# Patient Record
Sex: Female | Born: 1965
Health system: Southern US, Community
[De-identification: ages and names within clinical notes are randomized; demographics above are authoritative.]

## PROBLEM LIST (undated history)

## (undated) DIAGNOSIS — M1990 Unspecified osteoarthritis, unspecified site: Secondary | ICD-10-CM

## (undated) DIAGNOSIS — R7303 Prediabetes: Secondary | ICD-10-CM

## (undated) DIAGNOSIS — J302 Other seasonal allergic rhinitis: Secondary | ICD-10-CM

## (undated) DIAGNOSIS — M545 Low back pain, unspecified: Secondary | ICD-10-CM

## (undated) DIAGNOSIS — J4540 Moderate persistent asthma, uncomplicated: Secondary | ICD-10-CM

## (undated) DIAGNOSIS — G8929 Other chronic pain: Secondary | ICD-10-CM

## (undated) DIAGNOSIS — IMO0002 Reserved for concepts with insufficient information to code with codable children: Secondary | ICD-10-CM

## (undated) DIAGNOSIS — F329 Major depressive disorder, single episode, unspecified: Secondary | ICD-10-CM

## (undated) DIAGNOSIS — I10 Essential (primary) hypertension: Secondary | ICD-10-CM

## (undated) DIAGNOSIS — Z8711 Personal history of peptic ulcer disease: Secondary | ICD-10-CM

## (undated) DIAGNOSIS — M5430 Sciatica, unspecified side: Secondary | ICD-10-CM

## (undated) DIAGNOSIS — Z8719 Personal history of other diseases of the digestive system: Secondary | ICD-10-CM

## (undated) DIAGNOSIS — S83511A Sprain of anterior cruciate ligament of right knee, initial encounter: Secondary | ICD-10-CM

## (undated) DIAGNOSIS — J45909 Unspecified asthma, uncomplicated: Secondary | ICD-10-CM

## (undated) DIAGNOSIS — Z973 Presence of spectacles and contact lenses: Secondary | ICD-10-CM

## (undated) DIAGNOSIS — G4730 Sleep apnea, unspecified: Secondary | ICD-10-CM

## (undated) DIAGNOSIS — R0602 Shortness of breath: Secondary | ICD-10-CM

## (undated) DIAGNOSIS — S83206A Unspecified tear of unspecified meniscus, current injury, right knee, initial encounter: Secondary | ICD-10-CM

## (undated) DIAGNOSIS — E785 Hyperlipidemia, unspecified: Secondary | ICD-10-CM

## (undated) DIAGNOSIS — F32A Depression, unspecified: Secondary | ICD-10-CM

## (undated) DIAGNOSIS — F1020 Alcohol dependence, uncomplicated: Secondary | ICD-10-CM

## (undated) DIAGNOSIS — F319 Bipolar disorder, unspecified: Secondary | ICD-10-CM

## (undated) DIAGNOSIS — G4733 Obstructive sleep apnea (adult) (pediatric): Secondary | ICD-10-CM

## (undated) DIAGNOSIS — U099 Post covid-19 condition, unspecified: Secondary | ICD-10-CM

## (undated) DIAGNOSIS — R053 Chronic cough: Secondary | ICD-10-CM

## (undated) DIAGNOSIS — K5909 Other constipation: Secondary | ICD-10-CM

## (undated) DIAGNOSIS — K219 Gastro-esophageal reflux disease without esophagitis: Secondary | ICD-10-CM

## (undated) HISTORY — DX: Sleep apnea, unspecified: G47.30

## (undated) HISTORY — DX: Unspecified osteoarthritis, unspecified site: M19.90

## (undated) HISTORY — PX: ABDOMINAL HYSTERECTOMY: SHX81

---
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
## 1998-06-30 ENCOUNTER — Emergency Department (HOSPITAL_COMMUNITY): Admission: EM | Admit: 1998-06-30 | Discharge: 1998-06-30 | Payer: Self-pay | Admitting: Emergency Medicine

## 1998-11-14 ENCOUNTER — Emergency Department (HOSPITAL_COMMUNITY): Admission: EM | Admit: 1998-11-14 | Discharge: 1998-11-14 | Payer: Self-pay | Admitting: Emergency Medicine

## 1999-04-28 ENCOUNTER — Encounter: Payer: Self-pay | Admitting: Emergency Medicine

## 1999-04-28 ENCOUNTER — Emergency Department (HOSPITAL_COMMUNITY): Admission: EM | Admit: 1999-04-28 | Discharge: 1999-04-28 | Payer: Self-pay | Admitting: Emergency Medicine

## 2000-02-18 ENCOUNTER — Emergency Department (HOSPITAL_COMMUNITY): Admission: EM | Admit: 2000-02-18 | Discharge: 2000-02-18 | Payer: Self-pay | Admitting: Emergency Medicine

## 2000-05-14 ENCOUNTER — Emergency Department (HOSPITAL_COMMUNITY): Admission: EM | Admit: 2000-05-14 | Discharge: 2000-05-15 | Payer: Self-pay | Admitting: Emergency Medicine

## 2000-05-16 ENCOUNTER — Emergency Department (HOSPITAL_COMMUNITY): Admission: EM | Admit: 2000-05-16 | Discharge: 2000-05-16 | Payer: Self-pay | Admitting: Emergency Medicine

## 2000-05-16 ENCOUNTER — Encounter: Payer: Self-pay | Admitting: *Deleted

## 2003-03-02 ENCOUNTER — Emergency Department (HOSPITAL_COMMUNITY): Admission: EM | Admit: 2003-03-02 | Discharge: 2003-03-02 | Payer: Self-pay | Admitting: Emergency Medicine

## 2003-03-02 ENCOUNTER — Emergency Department (HOSPITAL_COMMUNITY): Admission: EM | Admit: 2003-03-02 | Discharge: 2003-03-02 | Payer: Self-pay | Admitting: *Deleted

## 2003-03-02 ENCOUNTER — Encounter: Payer: Self-pay | Admitting: Emergency Medicine

## 2003-11-10 ENCOUNTER — Emergency Department (HOSPITAL_COMMUNITY): Admission: EM | Admit: 2003-11-10 | Discharge: 2003-11-10 | Payer: Self-pay | Admitting: Emergency Medicine

## 2003-12-15 ENCOUNTER — Emergency Department (HOSPITAL_COMMUNITY): Admission: EM | Admit: 2003-12-15 | Discharge: 2003-12-15 | Payer: Self-pay | Admitting: Emergency Medicine

## 2004-02-06 ENCOUNTER — Emergency Department (HOSPITAL_COMMUNITY): Admission: EM | Admit: 2004-02-06 | Discharge: 2004-02-07 | Payer: Self-pay | Admitting: Emergency Medicine

## 2004-02-18 ENCOUNTER — Ambulatory Visit (HOSPITAL_COMMUNITY): Admission: RE | Admit: 2004-02-18 | Discharge: 2004-02-18 | Payer: Self-pay | Admitting: Emergency Medicine

## 2004-11-13 ENCOUNTER — Emergency Department (HOSPITAL_COMMUNITY): Admission: EM | Admit: 2004-11-13 | Discharge: 2004-11-13 | Payer: Self-pay | Admitting: Emergency Medicine

## 2004-12-10 ENCOUNTER — Emergency Department (HOSPITAL_COMMUNITY): Admission: EM | Admit: 2004-12-10 | Discharge: 2004-12-11 | Payer: Self-pay | Admitting: Emergency Medicine

## 2005-02-12 ENCOUNTER — Emergency Department (HOSPITAL_COMMUNITY): Admission: EM | Admit: 2005-02-12 | Discharge: 2005-02-12 | Payer: Self-pay | Admitting: Emergency Medicine

## 2005-03-02 ENCOUNTER — Emergency Department (HOSPITAL_COMMUNITY): Admission: EM | Admit: 2005-03-02 | Discharge: 2005-03-02 | Payer: Self-pay | Admitting: Emergency Medicine

## 2005-06-04 ENCOUNTER — Emergency Department (HOSPITAL_COMMUNITY): Admission: EM | Admit: 2005-06-04 | Discharge: 2005-06-04 | Payer: Self-pay | Admitting: Emergency Medicine

## 2005-08-09 ENCOUNTER — Emergency Department (HOSPITAL_COMMUNITY): Admission: EM | Admit: 2005-08-09 | Discharge: 2005-08-09 | Payer: Self-pay | Admitting: Emergency Medicine

## 2005-10-19 ENCOUNTER — Emergency Department (HOSPITAL_COMMUNITY): Admission: EM | Admit: 2005-10-19 | Discharge: 2005-10-19 | Payer: Self-pay | Admitting: Emergency Medicine

## 2005-11-28 ENCOUNTER — Emergency Department (HOSPITAL_COMMUNITY): Admission: EM | Admit: 2005-11-28 | Discharge: 2005-11-28 | Payer: Self-pay | Admitting: Emergency Medicine

## 2006-04-03 ENCOUNTER — Emergency Department (HOSPITAL_COMMUNITY): Admission: EM | Admit: 2006-04-03 | Discharge: 2006-04-03 | Payer: Self-pay | Admitting: Emergency Medicine

## 2006-04-14 ENCOUNTER — Emergency Department (HOSPITAL_COMMUNITY): Admission: EM | Admit: 2006-04-14 | Discharge: 2006-04-14 | Payer: Self-pay | Admitting: Emergency Medicine

## 2006-04-17 ENCOUNTER — Inpatient Hospital Stay (HOSPITAL_COMMUNITY): Admission: AD | Admit: 2006-04-17 | Discharge: 2006-04-17 | Payer: Self-pay | Admitting: Gynecology

## 2006-04-19 ENCOUNTER — Ambulatory Visit: Payer: Self-pay | Admitting: Obstetrics & Gynecology

## 2006-04-19 ENCOUNTER — Inpatient Hospital Stay (HOSPITAL_COMMUNITY): Admission: AD | Admit: 2006-04-19 | Discharge: 2006-04-22 | Payer: Self-pay | Admitting: Gynecology

## 2006-04-20 ENCOUNTER — Encounter (INDEPENDENT_AMBULATORY_CARE_PROVIDER_SITE_OTHER): Payer: Self-pay | Admitting: Specialist

## 2006-04-20 HISTORY — PX: TOTAL ABDOMINAL HYSTERECTOMY W/ BILATERAL SALPINGOOPHORECTOMY: SHX83

## 2006-04-26 ENCOUNTER — Ambulatory Visit: Payer: Self-pay | Admitting: Gynecology

## 2006-06-01 ENCOUNTER — Ambulatory Visit: Payer: Self-pay | Admitting: Gynecology

## 2006-06-27 ENCOUNTER — Emergency Department (HOSPITAL_COMMUNITY): Admission: EM | Admit: 2006-06-27 | Discharge: 2006-06-27 | Payer: Self-pay | Admitting: Emergency Medicine

## 2006-06-30 ENCOUNTER — Emergency Department (HOSPITAL_COMMUNITY): Admission: EM | Admit: 2006-06-30 | Discharge: 2006-06-30 | Payer: Self-pay | Admitting: Emergency Medicine

## 2006-08-18 ENCOUNTER — Ambulatory Visit: Payer: Self-pay | Admitting: Family Medicine

## 2006-08-21 ENCOUNTER — Ambulatory Visit: Payer: Self-pay | Admitting: *Deleted

## 2006-08-22 ENCOUNTER — Ambulatory Visit (HOSPITAL_COMMUNITY): Admission: RE | Admit: 2006-08-22 | Discharge: 2006-08-22 | Payer: Self-pay | Admitting: Internal Medicine

## 2006-10-17 ENCOUNTER — Emergency Department (HOSPITAL_COMMUNITY): Admission: EM | Admit: 2006-10-17 | Discharge: 2006-10-17 | Payer: Self-pay | Admitting: Emergency Medicine

## 2006-10-24 ENCOUNTER — Emergency Department (HOSPITAL_COMMUNITY): Admission: EM | Admit: 2006-10-24 | Discharge: 2006-10-24 | Payer: Self-pay | Admitting: Emergency Medicine

## 2006-11-21 ENCOUNTER — Ambulatory Visit: Payer: Self-pay | Admitting: Family Medicine

## 2006-11-28 ENCOUNTER — Emergency Department (HOSPITAL_COMMUNITY): Admission: EM | Admit: 2006-11-28 | Discharge: 2006-11-28 | Payer: Self-pay | Admitting: Emergency Medicine

## 2007-01-30 ENCOUNTER — Emergency Department (HOSPITAL_COMMUNITY): Admission: EM | Admit: 2007-01-30 | Discharge: 2007-01-30 | Payer: Self-pay | Admitting: Emergency Medicine

## 2007-02-10 ENCOUNTER — Emergency Department (HOSPITAL_COMMUNITY): Admission: EM | Admit: 2007-02-10 | Discharge: 2007-02-10 | Payer: Self-pay | Admitting: Emergency Medicine

## 2007-03-27 ENCOUNTER — Emergency Department (HOSPITAL_COMMUNITY): Admission: EM | Admit: 2007-03-27 | Discharge: 2007-03-27 | Payer: Self-pay | Admitting: Emergency Medicine

## 2007-03-29 ENCOUNTER — Emergency Department (HOSPITAL_COMMUNITY): Admission: EM | Admit: 2007-03-29 | Discharge: 2007-03-29 | Payer: Self-pay | Admitting: Emergency Medicine

## 2007-04-07 ENCOUNTER — Emergency Department (HOSPITAL_COMMUNITY): Admission: EM | Admit: 2007-04-07 | Discharge: 2007-04-07 | Payer: Self-pay | Admitting: Emergency Medicine

## 2007-04-18 ENCOUNTER — Emergency Department (HOSPITAL_COMMUNITY): Admission: EM | Admit: 2007-04-18 | Discharge: 2007-04-18 | Payer: Self-pay | Admitting: *Deleted

## 2007-04-23 ENCOUNTER — Ambulatory Visit: Payer: Self-pay | Admitting: Internal Medicine

## 2007-04-23 LAB — CONVERTED CEMR LAB
ALT: 13 units/L (ref 0–35)
AST: 18 units/L (ref 0–37)
Albumin: 4.1 g/dL (ref 3.5–5.2)
Alkaline Phosphatase: 55 units/L (ref 39–117)
BUN: 12 mg/dL (ref 6–23)
Basophils Absolute: 0 10*3/uL (ref 0.0–0.1)
Basophils Relative: 0 % (ref 0–1)
CO2: 25 meq/L (ref 19–32)
Calcium: 9.3 mg/dL (ref 8.4–10.5)
Chloride: 106 meq/L (ref 96–112)
Cholesterol: 200 mg/dL (ref 0–200)
Creatinine, Ser: 0.81 mg/dL (ref 0.40–1.20)
Eosinophils Absolute: 0.4 10*3/uL (ref 0.0–0.7)
Eosinophils Relative: 5 % (ref 0–5)
Glucose, Bld: 111 mg/dL — ABNORMAL HIGH (ref 70–99)
HCT: 36.8 % (ref 36.0–46.0)
HDL: 44 mg/dL (ref >39)
Helicobacter Pylori Antibody-IgG: 1.4 — ABNORMAL HIGH
Hemoglobin: 12.7 g/dL (ref 12.0–15.0)
LDL Cholesterol: 117 mg/dL — ABNORMAL HIGH (ref 0–99)
Lymphocytes Relative: 71 % — ABNORMAL HIGH (ref 12–46)
Lymphs Abs: 4.9 10*3/uL — ABNORMAL HIGH (ref 0.7–3.3)
MCHC: 34.5 g/dL (ref 30.0–36.0)
MCV: 89.5 fL (ref 78.0–100.0)
Monocytes Absolute: 0.6 10*3/uL (ref 0.2–0.7)
Monocytes Relative: 8 % (ref 3–11)
Neutro Abs: 1.1 10*3/uL — ABNORMAL LOW (ref 1.7–7.7)
Neutrophils Relative %: 16 % — ABNORMAL LOW (ref 43–77)
Platelets: 340 10*3/uL (ref 150–400)
Potassium: 4 meq/L (ref 3.5–5.3)
RBC: 4.11 M/uL (ref 3.87–5.11)
RDW: 13.7 % (ref 11.5–14.0)
Sodium: 141 meq/L (ref 135–145)
TSH: 1.119 microintl units/mL (ref 0.350–5.50)
Total Bilirubin: 0.3 mg/dL (ref 0.3–1.2)
Total CHOL/HDL Ratio: 4.5
Total Protein: 8 g/dL (ref 6.0–8.3)
Triglycerides: 195 mg/dL — ABNORMAL HIGH (ref <150)
VLDL: 39 mg/dL (ref 0–40)
WBC: 6.9 10*3/uL (ref 4.0–10.5)

## 2007-04-25 ENCOUNTER — Encounter (INDEPENDENT_AMBULATORY_CARE_PROVIDER_SITE_OTHER): Payer: Self-pay | Admitting: *Deleted

## 2007-04-26 ENCOUNTER — Ambulatory Visit: Payer: Self-pay | Admitting: Internal Medicine

## 2007-05-07 ENCOUNTER — Ambulatory Visit (HOSPITAL_COMMUNITY): Admission: RE | Admit: 2007-05-07 | Discharge: 2007-05-07 | Payer: Self-pay | Admitting: Nurse Practitioner

## 2007-05-07 ENCOUNTER — Emergency Department (HOSPITAL_COMMUNITY): Admission: EM | Admit: 2007-05-07 | Discharge: 2007-05-07 | Payer: Self-pay | Admitting: Emergency Medicine

## 2007-05-14 ENCOUNTER — Ambulatory Visit: Payer: Self-pay | Admitting: Internal Medicine

## 2007-05-24 ENCOUNTER — Emergency Department (HOSPITAL_COMMUNITY): Admission: EM | Admit: 2007-05-24 | Discharge: 2007-05-24 | Payer: Self-pay | Admitting: Emergency Medicine

## 2007-05-28 ENCOUNTER — Emergency Department (HOSPITAL_COMMUNITY): Admission: EM | Admit: 2007-05-28 | Discharge: 2007-05-28 | Payer: Self-pay | Admitting: Emergency Medicine

## 2007-07-23 ENCOUNTER — Emergency Department (HOSPITAL_COMMUNITY): Admission: EM | Admit: 2007-07-23 | Discharge: 2007-07-23 | Payer: Self-pay | Admitting: Emergency Medicine

## 2007-07-26 ENCOUNTER — Ambulatory Visit: Payer: Self-pay | Admitting: Internal Medicine

## 2007-08-14 ENCOUNTER — Ambulatory Visit: Payer: Self-pay | Admitting: Family Medicine

## 2007-09-11 ENCOUNTER — Ambulatory Visit: Payer: Self-pay | Admitting: Internal Medicine

## 2007-09-17 ENCOUNTER — Emergency Department (HOSPITAL_COMMUNITY): Admission: EM | Admit: 2007-09-17 | Discharge: 2007-09-17 | Payer: Self-pay | Admitting: Emergency Medicine

## 2007-10-18 ENCOUNTER — Ambulatory Visit: Payer: Self-pay | Admitting: Internal Medicine

## 2007-10-29 ENCOUNTER — Emergency Department (HOSPITAL_COMMUNITY): Admission: EM | Admit: 2007-10-29 | Discharge: 2007-10-29 | Payer: Self-pay | Admitting: Emergency Medicine

## 2007-11-13 ENCOUNTER — Ambulatory Visit: Payer: Self-pay | Admitting: Internal Medicine

## 2008-01-12 ENCOUNTER — Emergency Department (HOSPITAL_COMMUNITY): Admission: EM | Admit: 2008-01-12 | Discharge: 2008-01-12 | Payer: Self-pay | Admitting: Emergency Medicine

## 2008-01-16 ENCOUNTER — Ambulatory Visit: Payer: Self-pay | Admitting: Internal Medicine

## 2008-01-21 ENCOUNTER — Emergency Department (HOSPITAL_COMMUNITY): Admission: EM | Admit: 2008-01-21 | Discharge: 2008-01-21 | Payer: Self-pay | Admitting: Family Medicine

## 2008-01-22 ENCOUNTER — Ambulatory Visit: Payer: Self-pay | Admitting: Internal Medicine

## 2008-01-30 ENCOUNTER — Ambulatory Visit (HOSPITAL_COMMUNITY): Admission: RE | Admit: 2008-01-30 | Discharge: 2008-01-30 | Payer: Self-pay | Admitting: Internal Medicine

## 2008-02-28 ENCOUNTER — Ambulatory Visit: Payer: Self-pay | Admitting: Internal Medicine

## 2008-04-24 ENCOUNTER — Encounter: Admission: RE | Admit: 2008-04-24 | Discharge: 2008-05-14 | Payer: Self-pay | Admitting: Neurosurgery

## 2008-04-30 ENCOUNTER — Ambulatory Visit: Payer: Self-pay | Admitting: Internal Medicine

## 2008-05-17 ENCOUNTER — Emergency Department (HOSPITAL_COMMUNITY): Admission: EM | Admit: 2008-05-17 | Discharge: 2008-05-17 | Payer: Self-pay | Admitting: Emergency Medicine

## 2008-05-20 ENCOUNTER — Ambulatory Visit: Payer: Self-pay | Admitting: Internal Medicine

## 2008-05-29 ENCOUNTER — Ambulatory Visit: Payer: Self-pay | Admitting: Internal Medicine

## 2008-06-03 ENCOUNTER — Ambulatory Visit: Payer: Self-pay | Admitting: Internal Medicine

## 2008-06-04 ENCOUNTER — Emergency Department (HOSPITAL_COMMUNITY): Admission: EM | Admit: 2008-06-04 | Discharge: 2008-06-04 | Payer: Self-pay | Admitting: Emergency Medicine

## 2008-06-26 ENCOUNTER — Ambulatory Visit: Payer: Self-pay | Admitting: Family Medicine

## 2008-07-10 ENCOUNTER — Ambulatory Visit: Payer: Self-pay | Admitting: Internal Medicine

## 2008-07-16 ENCOUNTER — Encounter: Admission: RE | Admit: 2008-07-16 | Discharge: 2008-07-16 | Payer: Self-pay | Admitting: Internal Medicine

## 2008-07-31 ENCOUNTER — Encounter: Admission: RE | Admit: 2008-07-31 | Discharge: 2008-07-31 | Payer: Self-pay | Admitting: Internal Medicine

## 2008-08-18 ENCOUNTER — Ambulatory Visit: Payer: Self-pay | Admitting: Internal Medicine

## 2008-08-22 ENCOUNTER — Emergency Department (HOSPITAL_COMMUNITY): Admission: EM | Admit: 2008-08-22 | Discharge: 2008-08-22 | Payer: Self-pay | Admitting: Emergency Medicine

## 2008-08-22 ENCOUNTER — Encounter: Admission: RE | Admit: 2008-08-22 | Discharge: 2008-08-22 | Payer: Self-pay | Admitting: Internal Medicine

## 2008-08-26 ENCOUNTER — Ambulatory Visit: Payer: Self-pay | Admitting: Internal Medicine

## 2008-09-28 IMAGING — CR DG LUMBAR SPINE COMPLETE 4+V
5 series · 5 of 5 positions shown · non-contrast
Comparison: Abdominal radiograph 05/07/2007.

CLINICAL DATA: 42-year-old female with low back pain radiating down
left leg.  No known injury.

LUMBAR SPINE - COMPLETE 4+ VIEW

[view not recorded (1 of 5)]
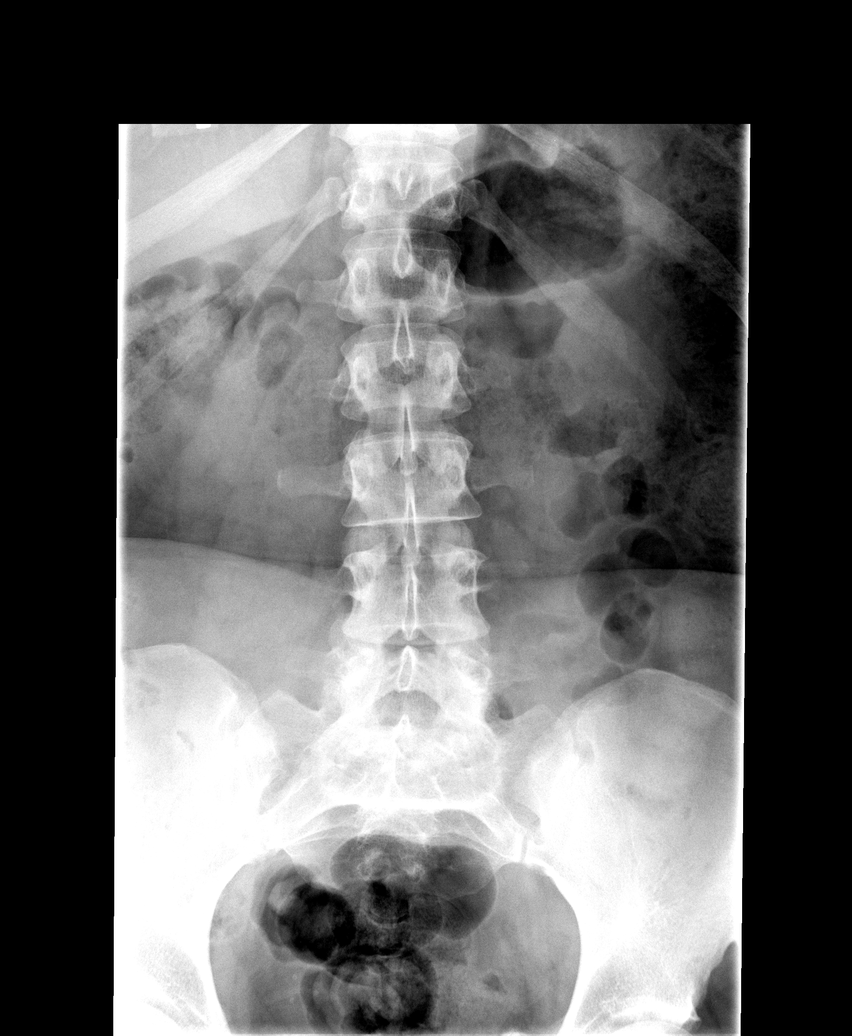

[view not recorded (2 of 5)]
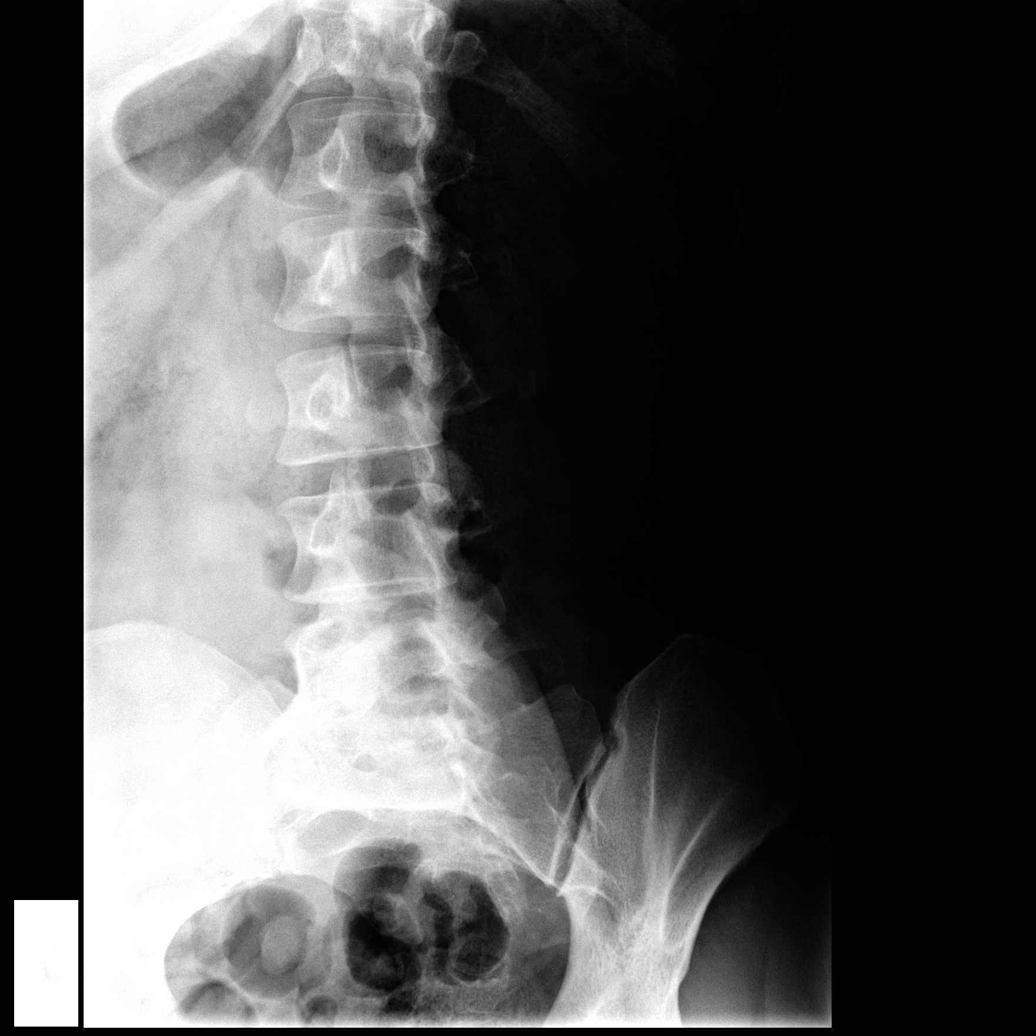

[view not recorded (3 of 5)]
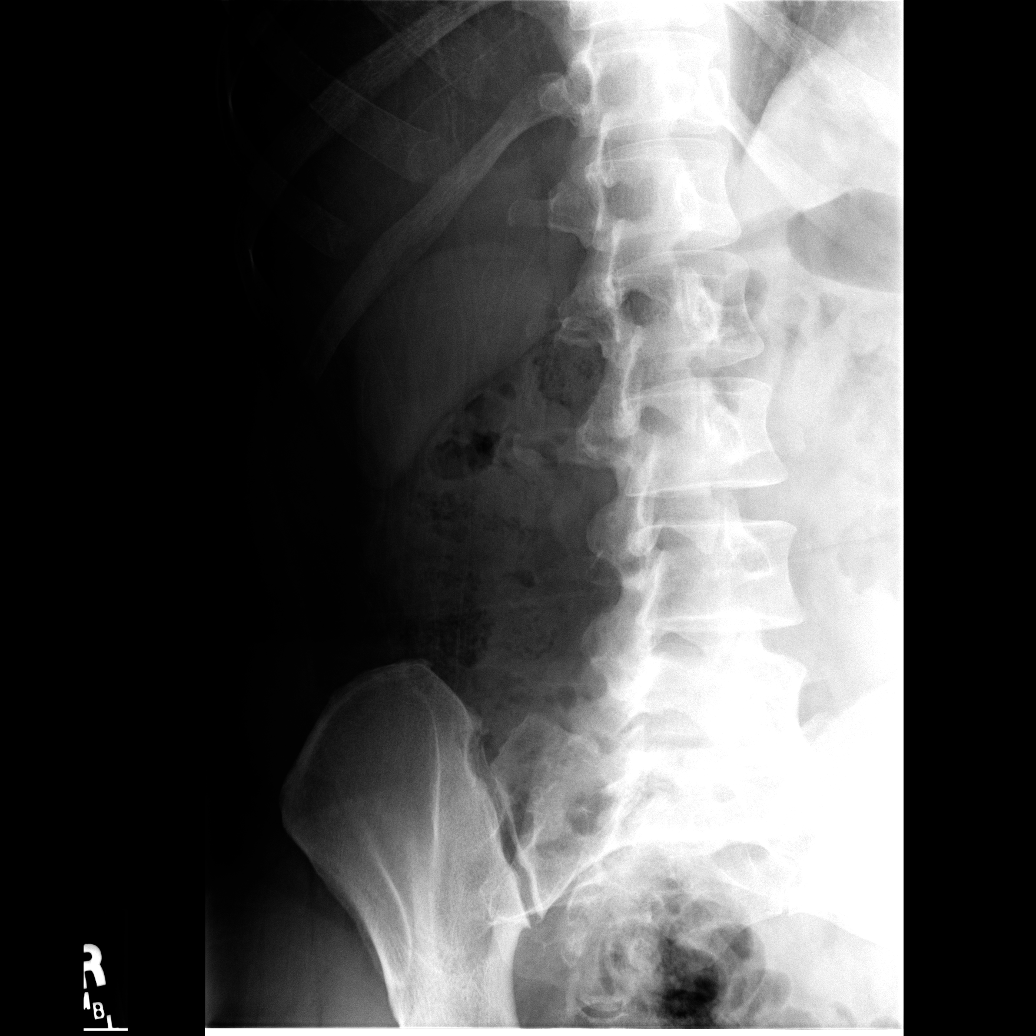

[view not recorded (4 of 5)]
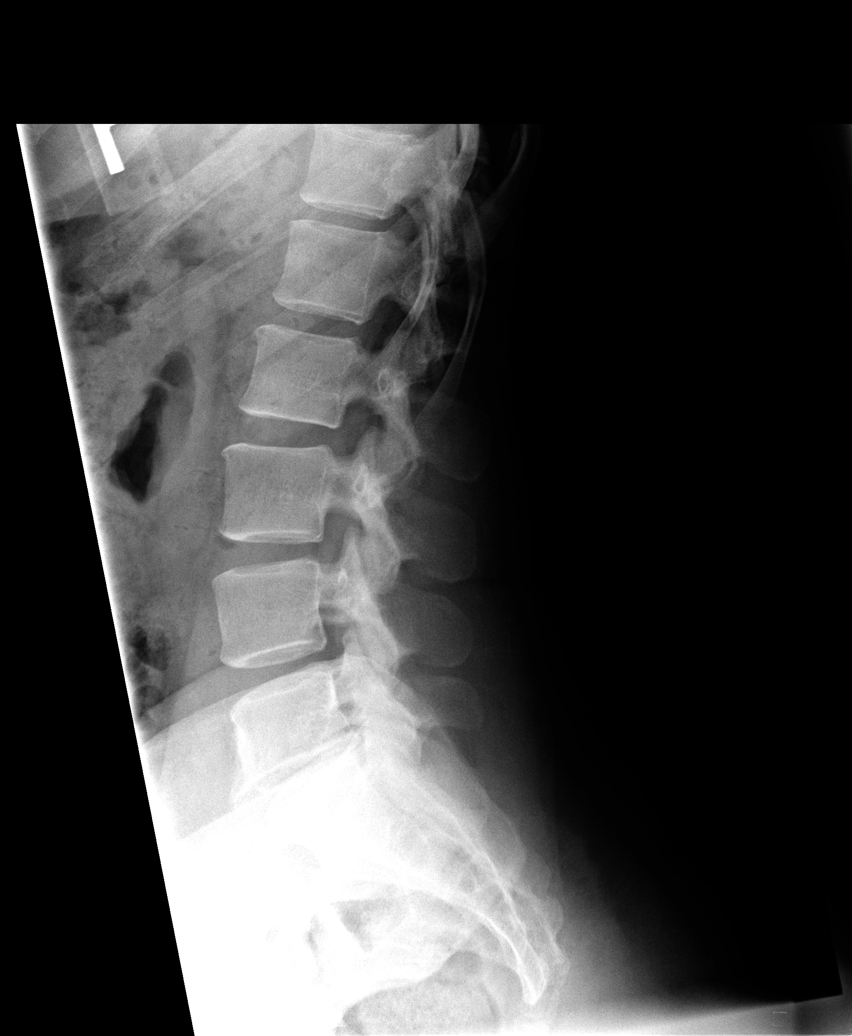

[view not recorded (5 of 5)]
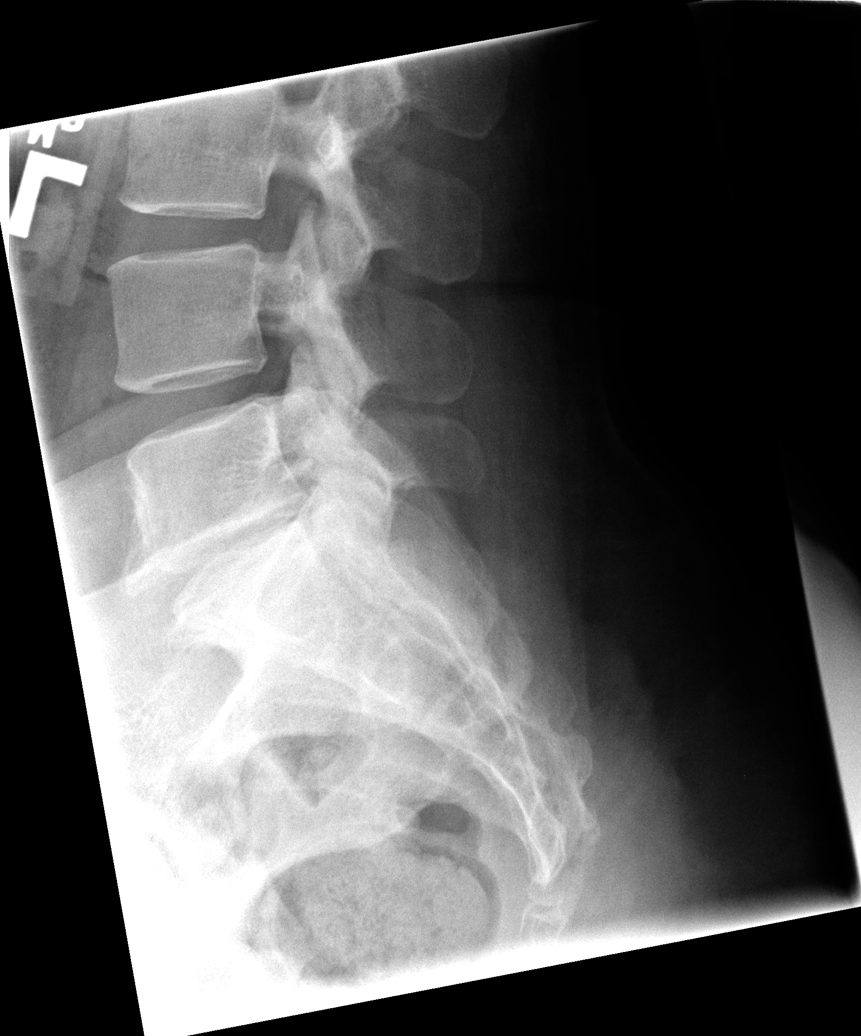

[5 of 5 positions shown; findings below may reference images not displayed]

FINDINGS: Normal lumbar segmentation.  L5-S1 disc space narrowing
with endplate osteophytes.  No spondylolisthesis.  No pars
fracture.  Visualized sacrum and pelvis appear intact.  Visualized
lower thoracic levels appear intact.
IMPRESSION: 1.  Chronic-appearing L5-S1 disc degeneration.
2. No acute fracture or listhesis identified in the lumbar spine.

## 2008-10-02 ENCOUNTER — Emergency Department (HOSPITAL_COMMUNITY): Admission: EM | Admit: 2008-10-02 | Discharge: 2008-10-02 | Payer: Self-pay | Admitting: Emergency Medicine

## 2008-10-21 ENCOUNTER — Ambulatory Visit: Payer: Self-pay | Admitting: Family Medicine

## 2008-10-21 ENCOUNTER — Encounter (INDEPENDENT_AMBULATORY_CARE_PROVIDER_SITE_OTHER): Payer: Self-pay | Admitting: Internal Medicine

## 2008-10-21 LAB — CONVERTED CEMR LAB
BUN: 18 mg/dL (ref 6–23)
CO2: 24 meq/L (ref 19–32)
Calcium: 9.9 mg/dL (ref 8.4–10.5)
Chloride: 103 meq/L (ref 96–112)
Creatinine, Ser: 0.98 mg/dL (ref 0.40–1.20)
Glucose, Bld: 95 mg/dL (ref 70–99)
Potassium: 3.9 meq/L (ref 3.5–5.3)
Sodium: 139 meq/L (ref 135–145)

## 2008-11-17 ENCOUNTER — Encounter: Payer: Self-pay | Admitting: Internal Medicine

## 2008-11-17 ENCOUNTER — Ambulatory Visit: Payer: Self-pay | Admitting: Internal Medicine

## 2008-11-17 DIAGNOSIS — K219 Gastro-esophageal reflux disease without esophagitis: Secondary | ICD-10-CM | POA: Insufficient documentation

## 2008-11-17 DIAGNOSIS — F329 Major depressive disorder, single episode, unspecified: Secondary | ICD-10-CM | POA: Insufficient documentation

## 2008-11-17 DIAGNOSIS — R51 Headache: Secondary | ICD-10-CM | POA: Insufficient documentation

## 2008-11-17 DIAGNOSIS — R519 Headache, unspecified: Secondary | ICD-10-CM | POA: Insufficient documentation

## 2008-11-17 DIAGNOSIS — J019 Acute sinusitis, unspecified: Secondary | ICD-10-CM | POA: Insufficient documentation

## 2008-11-17 DIAGNOSIS — J309 Allergic rhinitis, unspecified: Secondary | ICD-10-CM | POA: Insufficient documentation

## 2008-11-18 ENCOUNTER — Emergency Department (HOSPITAL_COMMUNITY): Admission: EM | Admit: 2008-11-18 | Discharge: 2008-11-18 | Payer: Self-pay | Admitting: Emergency Medicine

## 2008-12-06 ENCOUNTER — Emergency Department (HOSPITAL_COMMUNITY): Admission: EM | Admit: 2008-12-06 | Discharge: 2008-12-06 | Payer: Self-pay | Admitting: Emergency Medicine

## 2008-12-17 ENCOUNTER — Emergency Department (HOSPITAL_COMMUNITY): Admission: EM | Admit: 2008-12-17 | Discharge: 2008-12-17 | Payer: Self-pay | Admitting: Emergency Medicine

## 2008-12-25 ENCOUNTER — Ambulatory Visit: Payer: Self-pay | Admitting: Internal Medicine

## 2009-01-29 ENCOUNTER — Ambulatory Visit: Payer: Self-pay | Admitting: Internal Medicine

## 2009-02-12 ENCOUNTER — Ambulatory Visit: Payer: Self-pay | Admitting: Internal Medicine

## 2009-03-17 ENCOUNTER — Emergency Department (HOSPITAL_COMMUNITY): Admission: EM | Admit: 2009-03-17 | Discharge: 2009-03-17 | Payer: Self-pay | Admitting: Emergency Medicine

## 2009-03-17 ENCOUNTER — Telehealth (INDEPENDENT_AMBULATORY_CARE_PROVIDER_SITE_OTHER): Payer: Self-pay | Admitting: *Deleted

## 2009-05-14 ENCOUNTER — Ambulatory Visit: Payer: Self-pay | Admitting: Internal Medicine

## 2009-05-15 ENCOUNTER — Encounter (INDEPENDENT_AMBULATORY_CARE_PROVIDER_SITE_OTHER): Payer: Self-pay | Admitting: Internal Medicine

## 2009-05-15 LAB — CONVERTED CEMR LAB
BUN: 10 mg/dL (ref 6–23)
CO2: 25 meq/L (ref 19–32)
Calcium: 9.6 mg/dL (ref 8.4–10.5)
Chloride: 105 meq/L (ref 96–112)
Creatinine, Ser: 0.89 mg/dL (ref 0.40–1.20)
Glucose, Bld: 85 mg/dL (ref 70–99)
Potassium: 4.1 meq/L (ref 3.5–5.3)
Sodium: 140 meq/L (ref 135–145)

## 2009-05-18 ENCOUNTER — Emergency Department (HOSPITAL_COMMUNITY): Admission: EM | Admit: 2009-05-18 | Discharge: 2009-05-18 | Payer: Self-pay | Admitting: Emergency Medicine

## 2009-06-17 ENCOUNTER — Ambulatory Visit: Payer: Self-pay | Admitting: Obstetrics and Gynecology

## 2009-06-30 ENCOUNTER — Emergency Department (HOSPITAL_COMMUNITY): Admission: EM | Admit: 2009-06-30 | Discharge: 2009-06-30 | Payer: Self-pay | Admitting: Emergency Medicine

## 2009-08-05 ENCOUNTER — Ambulatory Visit: Payer: Self-pay | Admitting: Internal Medicine

## 2009-08-08 HISTORY — PX: BUNIONECTOMY: SHX129

## 2009-08-25 ENCOUNTER — Emergency Department (HOSPITAL_COMMUNITY): Admission: EM | Admit: 2009-08-25 | Discharge: 2009-08-25 | Payer: Self-pay | Admitting: Emergency Medicine

## 2009-08-25 IMAGING — CR DG CHEST 2V
2 series · 2 of 2 positions shown · non-contrast
Comparison: 12/06/2008

CLINICAL DATA: Cough

CHEST - 2 VIEW

[w chest lat]
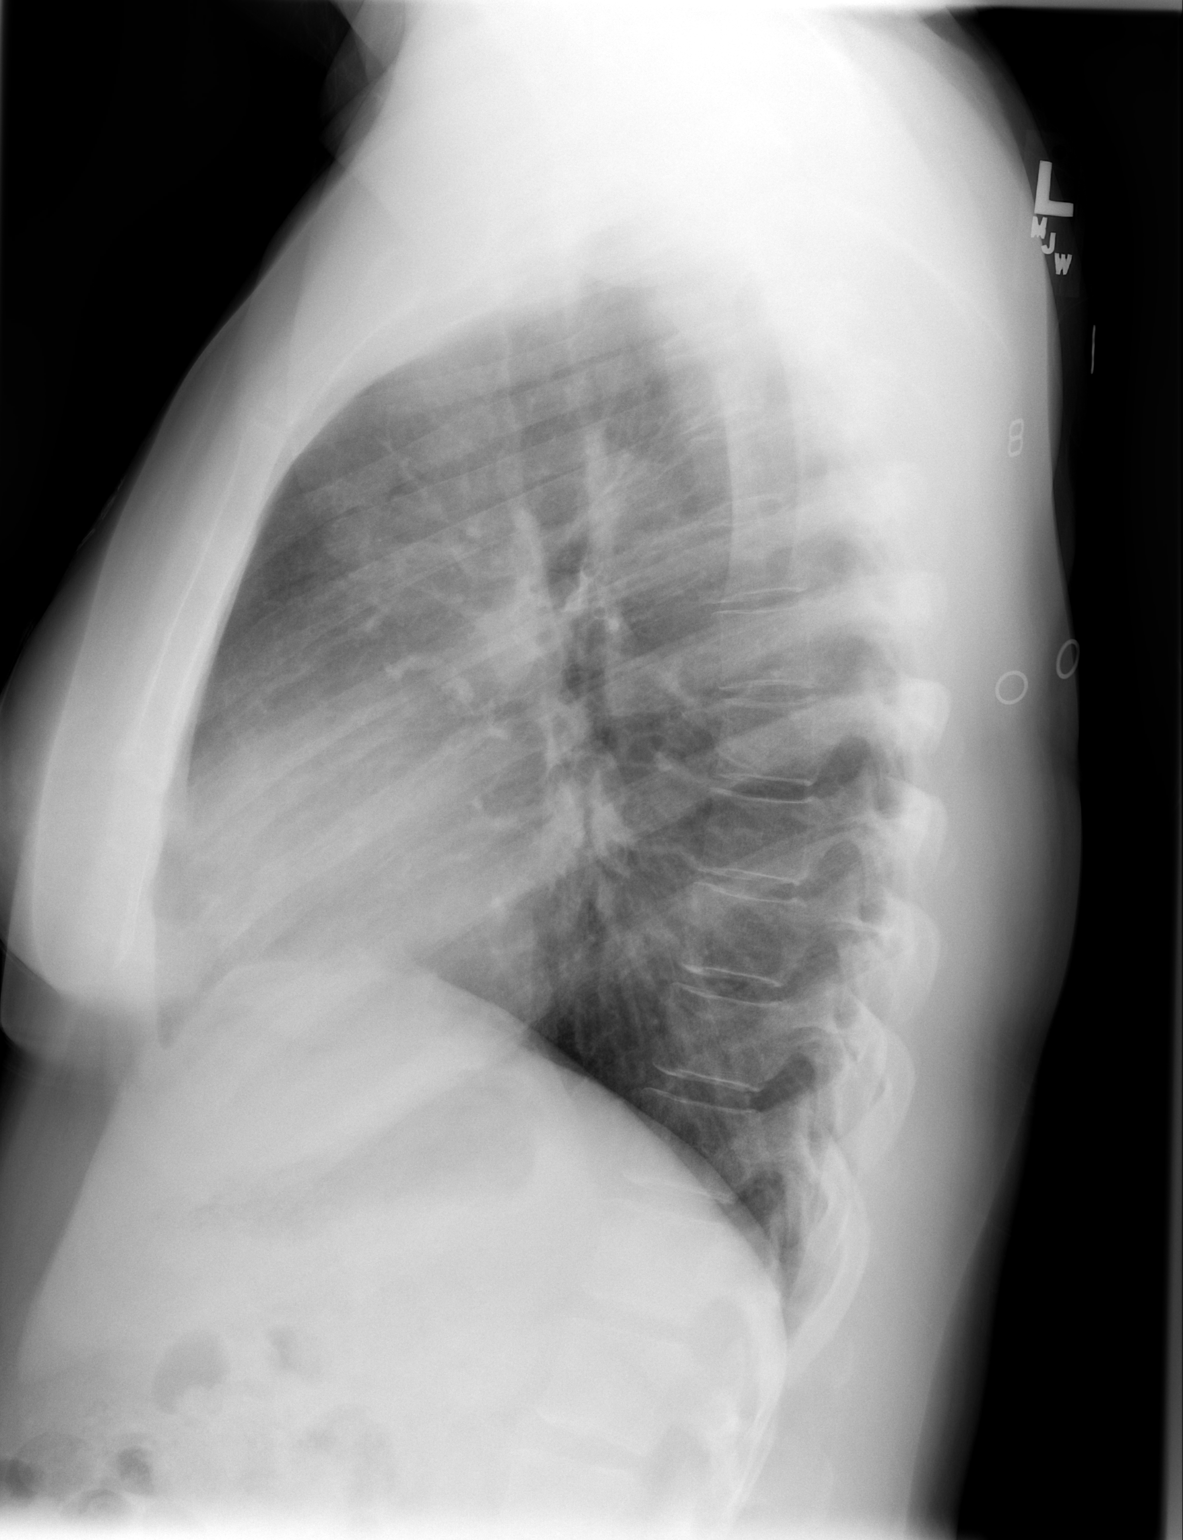

[w chest pa]
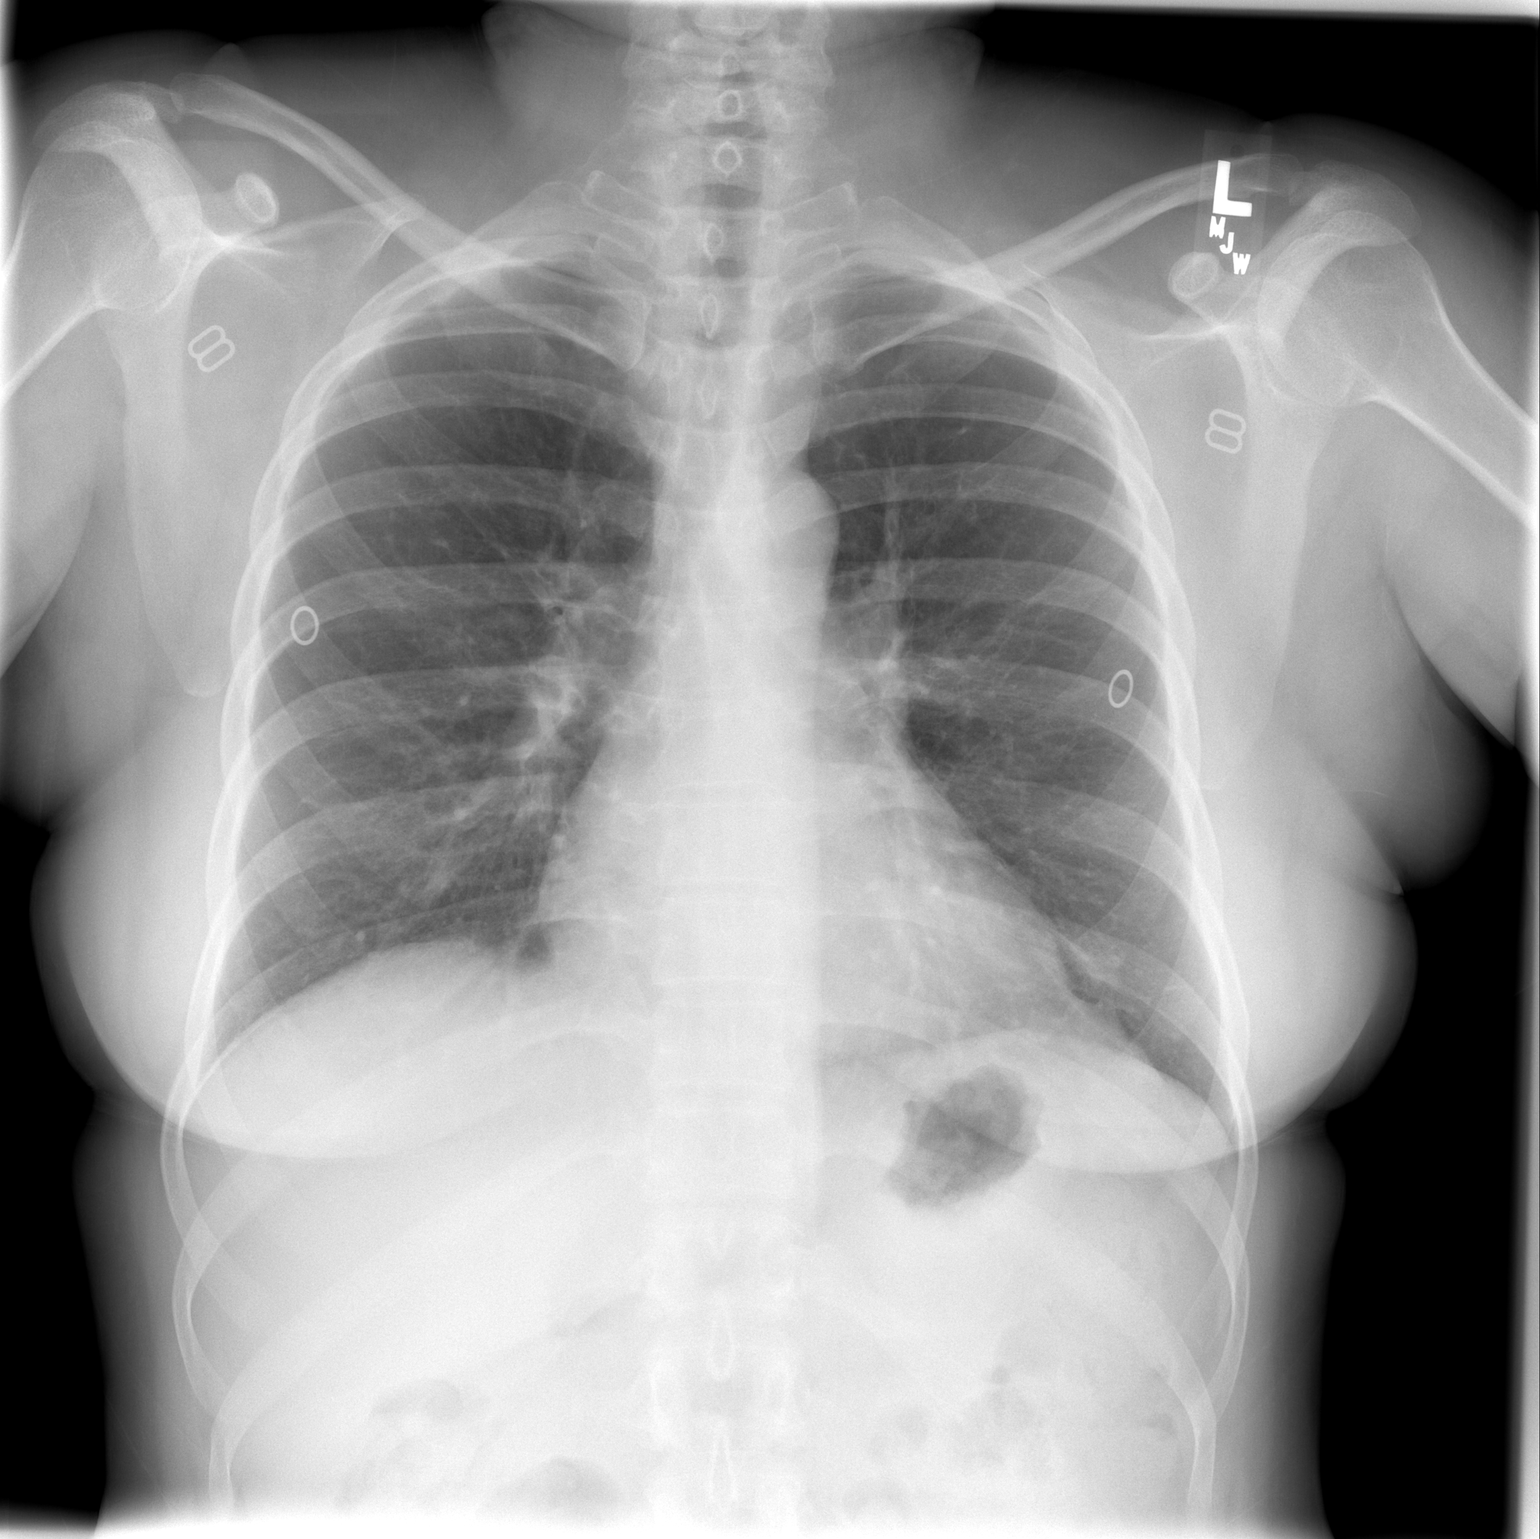

[2 of 2 positions shown; findings below may reference images not displayed]

FINDINGS: Heart size remains normal.  Peribronchial thickening
without active airspace disease.  There is minimal scarring at the
left base.  No pleural fluid or osseous lesions.
IMPRESSION: Chronic changes as above - no acute process or interval change.

## 2009-08-26 ENCOUNTER — Ambulatory Visit: Payer: Self-pay | Admitting: Internal Medicine

## 2009-10-01 ENCOUNTER — Emergency Department (HOSPITAL_COMMUNITY): Admission: EM | Admit: 2009-10-01 | Discharge: 2009-10-01 | Payer: Self-pay | Admitting: Emergency Medicine

## 2009-10-03 ENCOUNTER — Emergency Department (HOSPITAL_COMMUNITY): Admission: EM | Admit: 2009-10-03 | Discharge: 2009-10-03 | Payer: Self-pay | Admitting: Emergency Medicine

## 2009-10-27 ENCOUNTER — Emergency Department (HOSPITAL_COMMUNITY): Admission: EM | Admit: 2009-10-27 | Discharge: 2009-10-27 | Payer: Self-pay | Admitting: Emergency Medicine

## 2009-12-03 ENCOUNTER — Emergency Department (HOSPITAL_COMMUNITY): Admission: EM | Admit: 2009-12-03 | Discharge: 2009-12-03 | Payer: Self-pay | Admitting: Emergency Medicine

## 2010-01-21 ENCOUNTER — Emergency Department (HOSPITAL_COMMUNITY): Admission: EM | Admit: 2010-01-21 | Discharge: 2010-01-21 | Payer: Self-pay | Admitting: Emergency Medicine

## 2010-02-16 ENCOUNTER — Emergency Department (HOSPITAL_COMMUNITY): Admission: EM | Admit: 2010-02-16 | Discharge: 2010-02-16 | Payer: Self-pay | Admitting: Emergency Medicine

## 2010-03-22 ENCOUNTER — Encounter: Admission: RE | Admit: 2010-03-22 | Discharge: 2010-03-22 | Payer: Self-pay | Admitting: Specialist

## 2010-06-21 ENCOUNTER — Emergency Department (HOSPITAL_COMMUNITY): Admission: EM | Admit: 2010-06-21 | Discharge: 2010-06-21 | Payer: Self-pay | Admitting: Emergency Medicine

## 2010-08-06 ENCOUNTER — Encounter
Admission: RE | Admit: 2010-08-06 | Discharge: 2010-08-06 | Payer: Self-pay | Source: Home / Self Care | Attending: Specialist | Admitting: Specialist

## 2010-08-08 HISTORY — PX: HEEL SPUR SURGERY: SHX665

## 2010-09-07 NOTE — Miscellaneous (Signed)
Summary: Office Visit ([facility])    Vital Signs:  Patient profile:   45 year old female Weight:      191.0 pounds Temp:     98.0 degrees F oral Pulse rate:   72 / minute Pulse rhythm:   regular Resp:     20 per minute BP sitting:   106 / 80  (left arm)  Vitals Entered By: Sharen Heck RN (November 17, 2008 2:38 PM) CC: head congestion, night cough, brown and yellow phlegm, rib and nid back pain/ sx x 3 weeks Pain Assessment Patient in pain? yes     Location: ribs and mid back Intensity: 8 Type: aching  Does patient need assistance? Functional Status Self care Ambulation Normal   CC:  head congestion, night cough, brown and yellow phlegm, and rib and nid back pain/ sx x 3 weeks.  History of Present Illness: Pt c/o 3 weeks of sinus congestion, body aches, cough productive of yellow and brown sputum.  She also c/o scratchy eyes. No fever or chills.  Cough keeps her up at night.  Preventive Screening-Counseling & Management     Alcohol drinks/day: 0     Smoking Status: current     Packs/Day: <0.25     Caffeine use/day: 5     Does Patient Exercise: yes     Type of exercise: walks     Exercise (avg: min/session): 30-60     Times/week: 7  Current Problems (verified): 1)  Headache  (ICD-784.0) 2)  Gerd  (ICD-530.81) 3)  Depression  (ICD-311) 4)  Allergic Rhinitis  (ICD-477.9)  Current Medications (verified): 1)  Augmentin 875-125 Mg Tabs (Amoxicillin-Pot Clavulanate) .... Take 1 Tablet By Mouth Two Times A Day 2)  Tussionex Pennkinetic Er 8-10 Mg/3ml Lqcr (Chlorpheniramine-Hydrocodone) .... 5ml By Mough Two Times A Day 3)  Claritin 10 Mg Tabs (Loratadine) .... Take 1 Tablet By Mouth Once A Day 4)  Imitrex 50 Mg Tabs (Sumatriptan Succinate) .... As Directed 5)  Trazodone Hcl 100 Mg Tabs (Trazodone Hcl) .... Take 1 Tablet By Mouth At Bedtime As Needed 6)  Protonix 40 Mg Tbec (Pantoprazole Sodium) .... Take 1 Tablet By Mouth Once A Day 7)  Celexa 40 Mg Tabs  (Citalopram Hydrobromide) .... Take 1 Tablet By Mouth Once A Day 8)  Celebrex 200 Mg Caps (Celecoxib) .... Take 1 Tablet By Mouth Once A Day 9)  Flexeril 10 Mg Tabs (Cyclobenzaprine Hcl) .... Take 1 Tablet By Mouth Every 8 Hours As Needed  Allergies (verified): 1)  ! Benadryl  Past History:  Past Medical History:    Allergic rhinitis    Depression    GERD    Headache  Past Surgical History:    Hysterectomy   Review of Systems  The patient denies anorexia, fever, weight loss, and hemoptysis.     Physical Exam  General:  alert, well-developed, well-nourished, and well-hydrated.   Head:  normocephalic and atraumatic.   Mouth:  pharynx pink and moist, no erythema, and no exudates.   Lungs:  normal breath sounds.     Impression & Recommendations:  Problem # 1:  ACUTE SINUSITIS, UNSPECIFIED (ICD-461.9)  Her updated medication list for this problem includes:    Augmentin 875-125 Mg Tabs (Amoxicillin-pot clavulanate) .Marland Kitchen... Take 1 tablet by mouth two times a day    Tussionex Pennkinetic Er 8-10 Mg/46ml Lqcr (Chlorpheniramine-hydrocodone) .Marland KitchenMarland KitchenMarland KitchenMarland Kitchen 5ml by mough two times a day     claritin 10mg  once daily  Orders: Est. Patient Level III (45409)  Medications Added to Medication List This Visit: 1)  Augmentin 875-125 Mg Tabs (Amoxicillin-pot clavulanate) .... Take 1 tablet by mouth two times a day 2)  Tussionex Pennkinetic Er 8-10 Mg/9ml Lqcr (Chlorpheniramine-hydrocodone) .... 5ml by mough two times a day 3)  Claritin 10 Mg Tabs (Loratadine) .... Take 1 tablet by mouth once a day 4)  Imitrex 50 Mg Tabs (Sumatriptan succinate) .... As directed 5)  Trazodone Hcl 100 Mg Tabs (Trazodone hcl) .... Take 1 tablet by mouth at bedtime as needed 6)  Protonix 40 Mg Tbec (Pantoprazole sodium) .... Take 1 tablet by mouth once a day 7)  Celexa 40 Mg Tabs (Citalopram hydrobromide) .... Take 1 tablet by mouth once a day 8)  Celebrex 200 Mg Caps (Celecoxib) .... Take 1 tablet by mouth once a day  9)  Flexeril 10 Mg Tabs (Cyclobenzaprine hcl) .... Take 1 tablet by mouth every 8 hours as needed  Prescriptions: CLARITIN 10 MG TABS (LORATADINE) Take 1 tablet by mouth once a day  #30 x 5   Entered and Authorized by:   Yisroel Ramming MD   Signed by:   Yisroel Ramming MD on 11/17/2008   Method used:   Print then Give to Patient   RxID:   [id number] TUSSIONEX PENNKINETIC ER 8-10 MG/5ML LQCR (CHLORPHENIRAMINE-HYDROCODONE) 5ml by mough two times a day  #4oz x 0   Entered and Authorized by:   Yisroel Ramming MD   Signed by:   Yisroel Ramming MD on 11/17/2008   Method used:   Print then Give to Patient   RxID:   [id number] AUGMENTIN 875-125 MG TABS (AMOXICILLIN-POT CLAVULANATE) Take 1 tablet by mouth two times a day  #20 x 0   Entered and Authorized by:   Yisroel Ramming MD   Signed by:   Yisroel Ramming MD on 11/17/2008   Method used:   Print then Give to Patient   RxID:   [id number]

## 2010-09-07 NOTE — Miscellaneous (Signed)
Summary: VIP  Patient: [name] [name] Note: All result statuses are Final unless otherwise noted.  Tests: (1) VIP (Medications)   LLIMPORTMEDS              "Result Below..."       RESULT: BUTALBITAL-APAP-CAFFEINE TABS 50-325-40 MG*TAKE ONE TABLET EVERY 8 HOURS AS NEEDED FOR HEADACHE  Generic for FIORICET 325-50-40*08/18/2006*Last Refill: [id number]*******   LLIMPORTALLS              "Result Below..."       RESULT: DIPHENHYDRAMINE ORAL (BENADRYL)*2677**  Note: An exclamation mark (!) indicates a result that was not dispersed into the flowsheet. Document Creation Date: 06/07/2007 3:07 PM _______________________________________________________________________  (1) Order result status: Final Collection or observation date-time: 04/25/2007 Requested date-time: 04/25/2007 Receipt date-time:  Reported date-time: 04/25/2007 Referring Physician:   Ordering Physician:   Specimen Source:  Source: [place] Order Number:  Lab site:

## 2010-10-06 ENCOUNTER — Emergency Department (HOSPITAL_COMMUNITY)
Admission: EM | Admit: 2010-10-06 | Discharge: 2010-10-06 | Disposition: A | Discharge status: Home / Self Care | Payer: Medicaid Other | Attending: Emergency Medicine | Admitting: Emergency Medicine

## 2010-10-06 DIAGNOSIS — F329 Major depressive disorder, single episode, unspecified: Secondary | ICD-10-CM | POA: Insufficient documentation

## 2010-10-06 DIAGNOSIS — I10 Essential (primary) hypertension: Secondary | ICD-10-CM | POA: Insufficient documentation

## 2010-10-06 DIAGNOSIS — M1990 Unspecified osteoarthritis, unspecified site: Secondary | ICD-10-CM | POA: Insufficient documentation

## 2010-10-06 DIAGNOSIS — F3289 Other specified depressive episodes: Secondary | ICD-10-CM | POA: Insufficient documentation

## 2010-10-06 DIAGNOSIS — M4800 Spinal stenosis, site unspecified: Secondary | ICD-10-CM | POA: Insufficient documentation

## 2010-10-06 DIAGNOSIS — G43909 Migraine, unspecified, not intractable, without status migrainosus: Secondary | ICD-10-CM | POA: Insufficient documentation

## 2010-10-06 DIAGNOSIS — R110 Nausea: Secondary | ICD-10-CM | POA: Insufficient documentation

## 2010-10-06 LAB — URINALYSIS, ROUTINE W REFLEX MICROSCOPIC
Bilirubin Urine: NEGATIVE
Hgb urine dipstick: NEGATIVE
Ketones, ur: NEGATIVE mg/dL
Nitrite: NEGATIVE
Protein, ur: NEGATIVE mg/dL
Specific Gravity, Urine: 1.016 (ref 1.005–1.030)
Urine Glucose, Fasting: NEGATIVE mg/dL
Urobilinogen, UA: 0.2 mg/dL (ref 0.0–1.0)
pH: 7.5 (ref 5.0–8.0)

## 2010-10-06 LAB — POCT I-STAT, CHEM 8
BUN: 12 mg/dL (ref 6–23)
Calcium, Ion: 1.18 mmol/L (ref 1.12–1.32)
Chloride: 108 mEq/L (ref 96–112)
Creatinine, Ser: 0.9 mg/dL (ref 0.4–1.2)
Glucose, Bld: 99 mg/dL (ref 70–99)
HCT: 36 % (ref 36.0–46.0)
Hemoglobin: 12.2 g/dL (ref 12.0–15.0)
Potassium: 4.1 mEq/L (ref 3.5–5.1)
Sodium: 141 mEq/L (ref 135–145)
TCO2: 22 mmol/L (ref 0–100)

## 2010-10-24 LAB — DIFFERENTIAL
Basophils Absolute: 0.1 10*3/uL (ref 0.0–0.1)
Basophils Relative: 1 % (ref 0–1)
Eosinophils Absolute: 0.3 10*3/uL (ref 0.0–0.7)
Eosinophils Relative: 4 % (ref 0–5)
Lymphocytes Relative: 75 % — ABNORMAL HIGH (ref 12–46)
Lymphs Abs: 4.7 10*3/uL — ABNORMAL HIGH (ref 0.7–4.0)
Monocytes Absolute: 0.6 10*3/uL (ref 0.1–1.0)
Monocytes Relative: 9 % (ref 3–12)
Neutro Abs: 0.7 10*3/uL — ABNORMAL LOW (ref 1.7–7.7)
Neutrophils Relative %: 11 % — ABNORMAL LOW (ref 43–77)

## 2010-10-24 LAB — CBC
HCT: 38.7 % (ref 36.0–46.0)
Hemoglobin: 13.1 g/dL (ref 12.0–15.0)
MCHC: 33.8 g/dL (ref 30.0–36.0)
MCV: 91.1 fL (ref 78.0–100.0)
Platelets: 291 10*3/uL (ref 150–400)
RBC: 4.24 MIL/uL (ref 3.87–5.11)
RDW: 12.9 % (ref 11.5–15.5)
WBC: 6.4 10*3/uL (ref 4.0–10.5)

## 2010-10-24 LAB — BASIC METABOLIC PANEL
BUN: 7 mg/dL (ref 6–23)
CO2: 28 mEq/L (ref 19–32)
Calcium: 9.4 mg/dL (ref 8.4–10.5)
Chloride: 104 mEq/L (ref 96–112)
Creatinine, Ser: 0.87 mg/dL (ref 0.4–1.2)
GFR calc Af Amer: >60 mL/min (ref >60)
GFR calc non Af Amer: >60 mL/min (ref >60)
Glucose, Bld: 99 mg/dL (ref 70–99)
Potassium: 3.8 mEq/L (ref 3.5–5.1)
Sodium: 138 mEq/L (ref 135–145)

## 2010-10-24 LAB — CK: Total CK: 162 U/L (ref 7–177)

## 2010-10-24 LAB — SEDIMENTATION RATE: Sed Rate: 22 mm/hr (ref 0–22)

## 2010-10-24 LAB — PATHOLOGIST SMEAR REVIEW

## 2010-10-27 LAB — RAPID STREP SCREEN (MED CTR MEBANE ONLY): Streptococcus, Group A Screen (Direct): POSITIVE — AB

## 2010-11-13 ENCOUNTER — Emergency Department (HOSPITAL_COMMUNITY)
Admission: EM | Admit: 2010-11-13 | Discharge: 2010-11-13 | Disposition: A | Discharge status: Home / Self Care | Payer: Self-pay | Attending: Emergency Medicine | Admitting: Emergency Medicine

## 2010-11-13 DIAGNOSIS — H5710 Ocular pain, unspecified eye: Secondary | ICD-10-CM | POA: Insufficient documentation

## 2010-11-13 DIAGNOSIS — H11429 Conjunctival edema, unspecified eye: Secondary | ICD-10-CM | POA: Insufficient documentation

## 2010-11-13 DIAGNOSIS — T1590XA Foreign body on external eye, part unspecified, unspecified eye, initial encounter: Secondary | ICD-10-CM | POA: Insufficient documentation

## 2010-11-13 DIAGNOSIS — Z79899 Other long term (current) drug therapy: Secondary | ICD-10-CM | POA: Insufficient documentation

## 2010-11-13 DIAGNOSIS — H11419 Vascular abnormalities of conjunctiva, unspecified eye: Secondary | ICD-10-CM | POA: Insufficient documentation

## 2010-11-13 DIAGNOSIS — S058X9A Other injuries of unspecified eye and orbit, initial encounter: Secondary | ICD-10-CM | POA: Insufficient documentation

## 2010-11-13 DIAGNOSIS — R51 Headache: Secondary | ICD-10-CM | POA: Insufficient documentation

## 2010-11-13 DIAGNOSIS — Y929 Unspecified place or not applicable: Secondary | ICD-10-CM | POA: Insufficient documentation

## 2010-11-13 DIAGNOSIS — F329 Major depressive disorder, single episode, unspecified: Secondary | ICD-10-CM | POA: Insufficient documentation

## 2010-11-13 DIAGNOSIS — M1990 Unspecified osteoarthritis, unspecified site: Secondary | ICD-10-CM | POA: Insufficient documentation

## 2010-11-13 DIAGNOSIS — I10 Essential (primary) hypertension: Secondary | ICD-10-CM | POA: Insufficient documentation

## 2010-11-13 DIAGNOSIS — F3289 Other specified depressive episodes: Secondary | ICD-10-CM | POA: Insufficient documentation

## 2010-11-13 LAB — POCT URINALYSIS DIP (DEVICE)
Bilirubin Urine: NEGATIVE
Glucose, UA: NEGATIVE mg/dL
Hgb urine dipstick: NEGATIVE
Ketones, ur: NEGATIVE mg/dL
Nitrite: NEGATIVE
Protein, ur: NEGATIVE mg/dL
Specific Gravity, Urine: 1.025 (ref 1.005–1.030)
Urobilinogen, UA: 0.2 mg/dL (ref 0.0–1.0)
pH: 6 (ref 5.0–8.0)

## 2010-11-17 LAB — URINALYSIS, ROUTINE W REFLEX MICROSCOPIC
Glucose, UA: NEGATIVE mg/dL
Hgb urine dipstick: NEGATIVE
Ketones, ur: 15 mg/dL — AB
Leukocytes, UA: NEGATIVE
Nitrite: NEGATIVE
Protein, ur: 30 mg/dL — AB
Specific Gravity, Urine: 1.042 — ABNORMAL HIGH (ref 1.005–1.030)
Urobilinogen, UA: 1 mg/dL (ref 0.0–1.0)
pH: 6 (ref 5.0–8.0)

## 2010-11-17 LAB — DIFFERENTIAL
Basophils Absolute: 0.1 10*3/uL (ref 0.0–0.1)
Basophils Relative: 1 % (ref 0–1)
Eosinophils Absolute: 0.2 10*3/uL (ref 0.0–0.7)
Eosinophils Relative: 4 % (ref 0–5)
Lymphocytes Relative: 61 % — ABNORMAL HIGH (ref 12–46)
Lymphs Abs: 3.5 10*3/uL (ref 0.7–4.0)
Monocytes Absolute: 0.6 10*3/uL (ref 0.1–1.0)
Monocytes Relative: 10 % (ref 3–12)
Neutro Abs: 1.3 10*3/uL — ABNORMAL LOW (ref 1.7–7.7)
Neutrophils Relative %: 23 % — ABNORMAL LOW (ref 43–77)

## 2010-11-17 LAB — POCT I-STAT, CHEM 8
BUN: 13 mg/dL (ref 6–23)
Calcium, Ion: 1.19 mmol/L (ref 1.12–1.32)
Chloride: 107 mEq/L (ref 96–112)
Creatinine, Ser: 1 mg/dL (ref 0.4–1.2)
Glucose, Bld: 85 mg/dL (ref 70–99)
HCT: 37 % (ref 36.0–46.0)
Hemoglobin: 12.6 g/dL (ref 12.0–15.0)
Potassium: 3.7 mEq/L (ref 3.5–5.1)
Sodium: 141 mEq/L (ref 135–145)
TCO2: 26 mmol/L (ref 0–100)

## 2010-11-17 LAB — CBC
HCT: 36.9 % (ref 36.0–46.0)
Hemoglobin: 12.7 g/dL (ref 12.0–15.0)
MCHC: 34.4 g/dL (ref 30.0–36.0)
MCV: 92.4 fL (ref 78.0–100.0)
Platelets: 296 10*3/uL (ref 150–400)
RBC: 4 MIL/uL (ref 3.87–5.11)
RDW: 13.2 % (ref 11.5–15.5)
WBC: 5.8 10*3/uL (ref 4.0–10.5)

## 2010-11-17 LAB — RAPID STREP SCREEN (MED CTR MEBANE ONLY): Streptococcus, Group A Screen (Direct): NEGATIVE

## 2010-11-17 LAB — URINE MICROSCOPIC-ADD ON

## 2010-11-17 LAB — MONONUCLEOSIS SCREEN: Mono Screen: NEGATIVE

## 2010-12-24 NOTE — Op Note (Signed)
[name], [name]              ACCOUNT NO.:  [id number]   MEDICAL RECORD NO.:  [id number]          PATIENT TYPE:  INP   LOCATION:  9319                          FACILITY:  [facility]   PHYSICIAN:  Lesly Dukes, M.D. DATE OF BIRTH:  11-Nov-1965   DATE OF PROCEDURE:  04/20/2006  DATE OF DISCHARGE:                                 OPERATIVE REPORT   PREOPERATIVE DIAGNOSIS:  A 45 year old female with a 10 cm left adnexal mass  with ultrasound, intractable pain, menorrhagia and requesting definitive  surgical treatment for both.   POSTOPERATIVE DIAGNOSIS:  A 45 year old female with a 10 cm left adnexal  mass with ultrasound, intractable pain, menorrhagia and requesting  definitive surgical treatment for both.  Frozen section reveals benign  ovarian pathology.   PROCEDURE:  Total abdominal hysterectomy with bilateral salpingo-  oophorectomy.   SURGEON:  Lesly Dukes, M.D.   ASSISTANT:  Ginger Carne, MD.   ANESTHESIA:  General anesthesia.   PATHOLOGY:  Uterus, bilateral ovaries and bilateral fallopian tubes and  peritoneal washings.   ESTIMATED BLOOD LOSS:  150 mL.   COMPLICATIONS:  None.   After informed consent was obtained, patient was taken to the operating room  where general anesthesia was found to be adequate.  The patient was placed  in the dorsal supine position and prepped and draped in the normal sterile  fashion.  A Foley was placed in the bladder.  A vertical skin incision was  made with the scalpel and carried down to the fascia.  The fascia was  incised vertically and extended superiorly and inferiorly.  Peritoneum was  entered bluntly and washings were taken.  The Bookwalter retractor was used  and the bowel was packed away.  Uterus was brought into the field as well as  the ovarian cyst.  The ovary was incised __________ and the utero-ovarian  ligament transecting those pedicles, suturing them with 0 Vicryl.  The ovary  was sent off for frozen pathology  which was found to be benign.  The rest of  her hysterectomy was then performed.  The round ligaments were tented up,  clamped, transected, suture ligated with 0 Vicryl.  The anterior lip of the  broad ligament was then incised and a bladder flap was created.  The right  infundibulopelvic ligament was then isolated, doubly clamped, transected and  suture ligated with 0 Vicryl x2.  Good hemostasis was noted with pedicles.  The uterine arteries were then skeletonized, clamped, transected and suture  ligated with 0 Vicryl. Uterosacral ligaments were then clamped, transected  and suture ligated with 0 Vicryl.  The cervix was removed in a stepwise  fashion with good hemostasis.  Once at the bottom of the cervix, the vagina  was entered and the uterus and cervix were removed using the Mayo scissors.  Specimen was sent off for pathology.  Angles of the vagina were closed with  0 Vicryl and found to be hemostatic.  The vaginal cuff was closed with 0  Vicryl in a running locked fashion.  The peritoneal cavity was copiously  irrigated with warm saline and  found to be hemostatic.  All instruments were  removed from the patient's abdomen and all counts were correct.  Again, all  pedicles were found to be hemostatic.  Peritoneum and rectum muscles were  hemostatic.  The fascia was closed with looped 0 PDS in running fashion.  Good hemostasis was  noted.  The subcuticular tissue was hemostatic and copiously irrigated.  The  skin was closed with staples.  The patient tolerated the procedure well.  Sponge, lap, needle and instrument counts correct x2.  Patient went to the  recovery room in stable condition.           ______________________________  Lesly Dukes, M.D.     KHL/MEDQ  D:  04/20/2006  T:  04/21/2006  Job:  [id number]

## 2010-12-24 NOTE — Discharge Summary (Signed)
[name], [name]              ACCOUNT NO.:  [id number]   MEDICAL RECORD NO.:  [id number]          PATIENT TYPE:  INP   LOCATION:  9319                          FACILITY:  [facility]   PHYSICIAN:  Tanya S. Shawnie Pons, M.D.   DATE OF BIRTH:  [birth year]   DATE OF ADMISSION:  04/19/2006  DATE OF DISCHARGE:  04/22/2006                                 DISCHARGE SUMMARY   FINAL DIAGNOSES:  1. Ovarian cyst.  2. Abnormal uterine bleeding.  3. History of migraine headaches.  4. History of gastric ulcer.  5. History of crack-cocaine use.  6. Alcohol use.   PERTINENT LABORATORY DATA:  A preoperative hemoglobin  10.9, a postoperative  hemoglobin  9.3.  CA125 of 7.7.  Essentially negative UA.  Normal  electrolytes, kidney and liver functions.  Patient also had a previous  pelvic ultrasound that had revealed a 9-cm ovarian cyst.   PERTINENT PROCEDURES:  A total abdominal hysterectomy, bilateral salpingo-  oophorectomy.   REASON FOR ADMISSION:  Briefly, patient is a 45 year old para 4 who was  admitted for pain control and treatment of an ovarian cyst and abnormal  uterine bleeding, and for preop for surgery.   HOSPITAL COURSE:  Patient was admitted on the day prior to surgery as  described above.  On the next hospital day the patient underwent TAH/BSO  without difficulty.  Postoperatively she was transferred the floor.  She was  on IV pain medication as well as Phenergan as needed.  The patient's Foley  was discontinued on that day.  She has good urine output throughout.  Her  PCA was also discontinued on postoperative day number 1.  She was tolerating  p.o. pain medication and started passing flatus on postoperative day number  1.  On postoperative day number 2 she had remained afebrile.  She had good  urine output.  She was ambulating in the halls.  She was tolerating p.o.  liquids.  Her IV had been saline locked and she was on p.o. pain medication.  The patient desired to go home at that time.   It was felt the patient would  be stable for discharge at that time.   DISCHARGE DISPOSITION AND CONDITION:  Patient discharged home in good  condition.  Followup will be in the GYN Clinic for staple removal on  Thursday, September 20.  She will receive a call for an appointment on  Monday, on September 17.   DISCHARGE MEDICATIONS:  1. Percocet 5/325 1 to 2 p.o. q.4-6 h. p.r.n. pain, number 48, no refills.  2. Premarin 0.625 mg 1 p.o. daily, number 30, p.r.n. __________  refills.  3. Phenergan 25 mg 1 q.4-6 h. as needed for nausea, number 30, 2 refills.   Patient is instructed to return with persistent nausea, vomiting or fever.  She was encouraged to continue to push fluids.           ______________________________  Shelbie Proctor Shawnie Pons, M.D.     TSP/MEDQ  D:  04/22/2006  T:  04/24/2006  Job:  [id number]

## 2011-04-27 ENCOUNTER — Emergency Department (HOSPITAL_COMMUNITY)
Admission: EM | Admit: 2011-04-27 | Discharge: 2011-04-27 | Disposition: A | Discharge status: Home / Self Care | Payer: Self-pay | Attending: Emergency Medicine | Admitting: Emergency Medicine

## 2011-04-27 ENCOUNTER — Encounter (HOSPITAL_COMMUNITY): Payer: Self-pay

## 2011-04-27 ENCOUNTER — Emergency Department (HOSPITAL_COMMUNITY): Payer: Self-pay

## 2011-04-27 DIAGNOSIS — F329 Major depressive disorder, single episode, unspecified: Secondary | ICD-10-CM | POA: Insufficient documentation

## 2011-04-27 DIAGNOSIS — I10 Essential (primary) hypertension: Secondary | ICD-10-CM | POA: Insufficient documentation

## 2011-04-27 DIAGNOSIS — G8929 Other chronic pain: Secondary | ICD-10-CM | POA: Insufficient documentation

## 2011-04-27 DIAGNOSIS — R109 Unspecified abdominal pain: Secondary | ICD-10-CM | POA: Insufficient documentation

## 2011-04-27 DIAGNOSIS — G43909 Migraine, unspecified, not intractable, without status migrainosus: Secondary | ICD-10-CM | POA: Insufficient documentation

## 2011-04-27 DIAGNOSIS — F17200 Nicotine dependence, unspecified, uncomplicated: Secondary | ICD-10-CM | POA: Insufficient documentation

## 2011-04-27 DIAGNOSIS — F3289 Other specified depressive episodes: Secondary | ICD-10-CM | POA: Insufficient documentation

## 2011-04-27 DIAGNOSIS — Z79899 Other long term (current) drug therapy: Secondary | ICD-10-CM | POA: Insufficient documentation

## 2011-04-27 DIAGNOSIS — M549 Dorsalgia, unspecified: Secondary | ICD-10-CM | POA: Insufficient documentation

## 2011-04-27 DIAGNOSIS — F1911 Other psychoactive substance abuse, in remission: Secondary | ICD-10-CM | POA: Insufficient documentation

## 2011-04-27 HISTORY — DX: Essential (primary) hypertension: I10

## 2011-04-27 LAB — POCT PREGNANCY, URINE: Preg Test, Ur: NEGATIVE

## 2011-04-27 LAB — URINALYSIS, ROUTINE W REFLEX MICROSCOPIC
Bilirubin Urine: NEGATIVE
Glucose, UA: NEGATIVE mg/dL
Hgb urine dipstick: NEGATIVE
Ketones, ur: NEGATIVE mg/dL
Leukocytes, UA: NEGATIVE
Nitrite: NEGATIVE
Protein, ur: NEGATIVE mg/dL
Specific Gravity, Urine: 1.02 (ref 1.005–1.030)
Urobilinogen, UA: 0.2 mg/dL (ref 0.0–1.0)
pH: 7.5 (ref 5.0–8.0)

## 2011-04-27 LAB — CBC
HCT: 36 % (ref 36.0–46.0)
Hemoglobin: 12.4 g/dL (ref 12.0–15.0)
MCH: 30.4 pg (ref 26.0–34.0)
MCHC: 34.4 g/dL (ref 30.0–36.0)
MCV: 88.2 fL (ref 78.0–100.0)
Platelets: 301 10*3/uL (ref 150–400)
RBC: 4.08 MIL/uL (ref 3.87–5.11)
RDW: 12.7 % (ref 11.5–15.5)
WBC: 6.4 10*3/uL (ref 4.0–10.5)

## 2011-04-27 LAB — COMPREHENSIVE METABOLIC PANEL
ALT: 17 U/L (ref 0–35)
AST: 21 U/L (ref 0–37)
Albumin: 3.6 g/dL (ref 3.5–5.2)
Alkaline Phosphatase: 72 U/L (ref 39–117)
BUN: 12 mg/dL (ref 6–23)
CO2: 31 mEq/L (ref 19–32)
Calcium: 9.6 mg/dL (ref 8.4–10.5)
Chloride: 105 mEq/L (ref 96–112)
Creatinine, Ser: 0.9 mg/dL (ref 0.50–1.10)
GFR calc Af Amer: >60 mL/min (ref >60)
GFR calc non Af Amer: >60 mL/min (ref >60)
Glucose, Bld: 87 mg/dL (ref 70–99)
Potassium: 3.9 mEq/L (ref 3.5–5.1)
Sodium: 141 mEq/L (ref 135–145)
Total Bilirubin: 0.2 mg/dL — ABNORMAL LOW (ref 0.3–1.2)
Total Protein: 7.8 g/dL (ref 6.0–8.3)

## 2011-04-27 LAB — DIFFERENTIAL
Basophils Absolute: 0 10*3/uL (ref 0.0–0.1)
Basophils Relative: 0 % (ref 0–1)
Eosinophils Absolute: 0.2 10*3/uL (ref 0.0–0.7)
Eosinophils Relative: 3 % (ref 0–5)
Lymphocytes Relative: 72 % — ABNORMAL HIGH (ref 12–46)
Lymphs Abs: 4.6 10*3/uL — ABNORMAL HIGH (ref 0.7–4.0)
Monocytes Absolute: 0.5 10*3/uL (ref 0.1–1.0)
Monocytes Relative: 7 % (ref 3–12)
Neutro Abs: 1.1 10*3/uL — ABNORMAL LOW (ref 1.7–7.7)
Neutrophils Relative %: 17 % — ABNORMAL LOW (ref 43–77)

## 2011-04-27 LAB — LIPASE, BLOOD: Lipase: 45 U/L (ref 11–59)

## 2011-04-27 MED ORDER — IOHEXOL 300 MG/ML  SOLN
80.0000 mL | Freq: Once | INTRAMUSCULAR | Status: AC | PRN
  Administered 2011-04-27: 80 mL via INTRAVENOUS

## 2011-05-05 LAB — URINALYSIS, ROUTINE W REFLEX MICROSCOPIC
Bilirubin Urine: NEGATIVE
Glucose, UA: NEGATIVE
Hgb urine dipstick: NEGATIVE
Ketones, ur: NEGATIVE
Nitrite: NEGATIVE
Protein, ur: NEGATIVE
Specific Gravity, Urine: 1.023
Urobilinogen, UA: 0.2
pH: 6

## 2011-05-09 LAB — POCT I-STAT, CHEM 8
BUN: 13
Calcium, Ion: 1.21
Chloride: 105
Creatinine, Ser: 1.1
Glucose, Bld: 96
HCT: 35 — ABNORMAL LOW
Hemoglobin: 11.9 — ABNORMAL LOW
Potassium: 3.3 — ABNORMAL LOW
Sodium: 141
TCO2: 27

## 2011-05-09 LAB — CBC
HCT: 35.5 — ABNORMAL LOW
Hemoglobin: 12.2
MCHC: 34.5
MCV: 89.8
Platelets: 335
RBC: 3.96
RDW: 13.3
WBC: 6.2

## 2011-05-09 LAB — DIFFERENTIAL
Basophils Absolute: 0
Basophils Relative: 1
Eosinophils Absolute: 0.3
Eosinophils Relative: 6 — ABNORMAL HIGH
Lymphocytes Relative: 66 — ABNORMAL HIGH
Lymphs Abs: 4.1 — ABNORMAL HIGH
Monocytes Absolute: 0.6
Monocytes Relative: 9
Neutro Abs: 1.2 — ABNORMAL LOW
Neutrophils Relative %: 19 — ABNORMAL LOW

## 2011-05-09 LAB — D-DIMER, QUANTITATIVE: D-Dimer, Quant: 0.38

## 2011-05-11 ENCOUNTER — Emergency Department (HOSPITAL_COMMUNITY)
Admission: EM | Admit: 2011-05-11 | Discharge: 2011-05-11 | Disposition: A | Discharge status: Home / Self Care | Payer: Self-pay | Attending: Emergency Medicine | Admitting: Emergency Medicine

## 2011-05-11 DIAGNOSIS — R6883 Chills (without fever): Secondary | ICD-10-CM | POA: Insufficient documentation

## 2011-05-11 DIAGNOSIS — J40 Bronchitis, not specified as acute or chronic: Secondary | ICD-10-CM | POA: Insufficient documentation

## 2011-05-11 DIAGNOSIS — I10 Essential (primary) hypertension: Secondary | ICD-10-CM | POA: Insufficient documentation

## 2011-05-11 DIAGNOSIS — K219 Gastro-esophageal reflux disease without esophagitis: Secondary | ICD-10-CM | POA: Insufficient documentation

## 2011-05-11 DIAGNOSIS — F17200 Nicotine dependence, unspecified, uncomplicated: Secondary | ICD-10-CM | POA: Insufficient documentation

## 2011-05-11 DIAGNOSIS — R059 Cough, unspecified: Secondary | ICD-10-CM | POA: Insufficient documentation

## 2011-05-11 DIAGNOSIS — R05 Cough: Secondary | ICD-10-CM | POA: Insufficient documentation

## 2011-05-11 DIAGNOSIS — Z79899 Other long term (current) drug therapy: Secondary | ICD-10-CM | POA: Insufficient documentation

## 2011-05-19 LAB — CBC
HCT: 37.1
Hemoglobin: 12.5
MCHC: 33.6
MCV: 89.7
Platelets: 325
RBC: 4.14
RDW: 12.8
WBC: 6.9

## 2011-05-19 LAB — URINALYSIS, ROUTINE W REFLEX MICROSCOPIC
Bilirubin Urine: NEGATIVE
Glucose, UA: NEGATIVE
Hgb urine dipstick: NEGATIVE
Ketones, ur: NEGATIVE
Nitrite: NEGATIVE
Protein, ur: NEGATIVE
Specific Gravity, Urine: 1.024
Urobilinogen, UA: 1
pH: 6

## 2011-05-19 LAB — DIFFERENTIAL
Basophils Absolute: 0.1
Basophils Relative: 1
Eosinophils Absolute: 0.3
Eosinophils Relative: 5
Lymphocytes Relative: 72 — ABNORMAL HIGH
Lymphs Abs: 4.9 — ABNORMAL HIGH
Monocytes Absolute: 0.6
Monocytes Relative: 8
Neutro Abs: 1 — ABNORMAL LOW
Neutrophils Relative %: 14 — ABNORMAL LOW

## 2011-05-19 LAB — COMPREHENSIVE METABOLIC PANEL
ALT: 17
AST: 23
Albumin: 3.8
Alkaline Phosphatase: 51
BUN: 19
CO2: 30
Calcium: 9.3
Chloride: 102
Creatinine, Ser: 1
GFR calc Af Amer: >60
GFR calc non Af Amer: >60
Glucose, Bld: 96
Potassium: 3.5
Sodium: 138
Total Bilirubin: 0.5
Total Protein: 8.1

## 2011-07-06 ENCOUNTER — Emergency Department (HOSPITAL_COMMUNITY)
Admission: EM | Admit: 2011-07-06 | Discharge: 2011-07-06 | Disposition: A | Discharge status: Home / Self Care | Payer: Self-pay | Attending: Emergency Medicine | Admitting: Emergency Medicine

## 2011-07-06 ENCOUNTER — Encounter (HOSPITAL_COMMUNITY): Payer: Self-pay

## 2011-07-06 DIAGNOSIS — L03019 Cellulitis of unspecified finger: Secondary | ICD-10-CM | POA: Insufficient documentation

## 2011-07-06 DIAGNOSIS — Z79899 Other long term (current) drug therapy: Secondary | ICD-10-CM | POA: Insufficient documentation

## 2011-07-06 DIAGNOSIS — I10 Essential (primary) hypertension: Secondary | ICD-10-CM | POA: Insufficient documentation

## 2011-07-06 DIAGNOSIS — L03012 Cellulitis of left finger: Secondary | ICD-10-CM

## 2011-07-06 DIAGNOSIS — L02519 Cutaneous abscess of unspecified hand: Secondary | ICD-10-CM | POA: Insufficient documentation

## 2011-07-06 MED ORDER — CEPHALEXIN 500 MG PO CAPS: 500.0000 mg | ORAL_CAPSULE | Freq: Three times a day (TID) | ORAL | Status: AC

## 2011-07-06 NOTE — ED Notes (Signed)
Patient here with complaint of left hand middle finger pain. On assessment finger red and purulent drainage noted under skin, has attempted to drain w/o success

## 2011-07-06 NOTE — ED Provider Notes (Signed)
History     CSN: [id number] Arrival date & time: 07/06/2011  4:05 PM   First MD Initiated Contact with Patient 07/06/11 1712      No chief complaint on file.   (Consider location/radiation/quality/duration/timing/severity/associated sxs/prior treatment) HPI Comments: Patient reports she got a splinter in her finger - left 3rd finger, distal tuft- earlier in the week and tried to dig the splinter out with a needle.  She then developed swelling and her husband popped the lesion with another needle, with purulent discharge.  Finger then became red and swollen.  Denies fever, difficulty moving finger.    The history is provided by the patient.    Past Medical History  Diagnosis Date  . Hypertension   . History of hysterectomy   . Migraine     History reviewed. No pertinent past surgical history.  No family history on file.  History  Substance Use Topics  . Smoking status: Not on file  . Smokeless tobacco: Not on file  . Alcohol Use: Not on file    OB History    Grav Para Term Preterm Abortions TAB SAB Ect Mult Living                  Review of Systems  All other systems reviewed and are negative.    Allergies  Diphenhydramine hcl  Home Medications   Current Outpatient Rx  Name Route Sig Dispense Refill  . ALBUTEROL SULFATE HFA 108 (90 BASE) MCG/ACT IN AERS Inhalation Inhale 2 puffs into the lungs every 6 (six) hours as needed. wheezing     . BECLOMETHASONE DIPROPIONATE 80 MCG/ACT IN AERS Inhalation Inhale 1 puff into the lungs as needed.      Marland Kitchen CLONAZEPAM 0.5 MG PO TABS Oral Take 0.5 mg by mouth 2 (two) times daily.      . QUETIAPINE FUMARATE 50 MG PO TABS Oral Take 100 mg by mouth at bedtime. Pt takes 2 tabs for 100mg      . TRAMADOL HCL 50 MG PO TABS Oral Take 50 mg by mouth 2 (two) times daily as needed. Maximum dose= 8 tablets per day pain     . LAMOTRIGINE 100 MG PO TABS Oral Take 100 mg by mouth at bedtime.        BP 129/81  Pulse 75  Temp(Src) 97.8 F  (36.6 C) (Oral)  Resp 18  SpO2 100%  Physical Exam  Nursing note and vitals reviewed. Constitutional: She is oriented to person, place, and time. She appears well-developed and well-nourished.  HENT:  Head: Normocephalic and atraumatic.  Neck: Neck supple.  Pulmonary/Chest: Effort normal.  Neurological: She is alert and oriented to person, place, and time.  Skin:       Left hand, 3rd finger, distal tuft, pulmar surface with area of erythema, edema, central Winebarger/yellow scab, tender to palpation.      ED Course  Procedures (including critical care time) INCISION AND DRAINAGE Performed by: Rise Patience Consent: Verbal consent obtained. Risks and benefits: risks, benefits and alternatives were discussed Type: abscess  Body area: finger  Anesthesia: local infiltration +digital block  Local anesthetic: lidocaine 1% NO epinephrine  Complexity: simple  Drainage: sanguinous  Drainage amount: minimal  Packing material: none  Patient tolerance: Patient tolerated the procedure well with no immediate complications.    Labs Reviewed - No data to display No results found.   1. Cellulitis of finger of left hand       MDM  Patient with small area  of cellulitis, likely previously with purulent drainage, I&D performed at home under nonsterile conditions.  Have given antibiotics and advised close follow up.  Pt is not diabetic.          Dillard Cannon [place], [place] 07/06/11 [phone]

## 2011-07-07 NOTE — ED Provider Notes (Signed)
Medical screening examination/treatment/procedure(s) were performed by non-physician practitioner and as supervising physician I was immediately available for consultation/collaboration.   Hrithik Boschee, MD 07/07/11 0651 

## 2011-09-19 ENCOUNTER — Other Ambulatory Visit: Payer: Self-pay

## 2011-09-19 ENCOUNTER — Encounter (HOSPITAL_COMMUNITY): Payer: Self-pay | Admitting: Emergency Medicine

## 2011-09-19 ENCOUNTER — Emergency Department (HOSPITAL_COMMUNITY)
Admission: EM | Admit: 2011-09-19 | Discharge: 2011-09-19 | Disposition: A | Discharge status: Home / Self Care | Payer: Medicaid Other | Attending: Emergency Medicine | Admitting: Emergency Medicine

## 2011-09-19 ENCOUNTER — Emergency Department (HOSPITAL_COMMUNITY): Payer: Medicaid Other

## 2011-09-19 DIAGNOSIS — R05 Cough: Secondary | ICD-10-CM | POA: Insufficient documentation

## 2011-09-19 DIAGNOSIS — I10 Essential (primary) hypertension: Secondary | ICD-10-CM | POA: Insufficient documentation

## 2011-09-19 DIAGNOSIS — R1110 Vomiting, unspecified: Secondary | ICD-10-CM | POA: Insufficient documentation

## 2011-09-19 DIAGNOSIS — R1013 Epigastric pain: Secondary | ICD-10-CM

## 2011-09-19 DIAGNOSIS — K2970 Gastritis, unspecified, without bleeding: Secondary | ICD-10-CM | POA: Insufficient documentation

## 2011-09-19 DIAGNOSIS — R059 Cough, unspecified: Secondary | ICD-10-CM | POA: Insufficient documentation

## 2011-09-19 LAB — COMPREHENSIVE METABOLIC PANEL
ALT: 15 U/L (ref 0–35)
AST: 20 U/L (ref 0–37)
Albumin: 3.6 g/dL (ref 3.5–5.2)
Alkaline Phosphatase: 67 U/L (ref 39–117)
BUN: 13 mg/dL (ref 6–23)
CO2: 26 mEq/L (ref 19–32)
Calcium: 9.5 mg/dL (ref 8.4–10.5)
Chloride: 103 mEq/L (ref 96–112)
Creatinine, Ser: 0.93 mg/dL (ref 0.50–1.10)
GFR calc Af Amer: 84 mL/min — ABNORMAL LOW (ref >90)
GFR calc non Af Amer: 72 mL/min — ABNORMAL LOW (ref >90)
Glucose, Bld: 94 mg/dL (ref 70–99)
Potassium: 3.4 mEq/L — ABNORMAL LOW (ref 3.5–5.1)
Sodium: 139 mEq/L (ref 135–145)
Total Bilirubin: 0.2 mg/dL — ABNORMAL LOW (ref 0.3–1.2)
Total Protein: 7.9 g/dL (ref 6.0–8.3)

## 2011-09-19 LAB — CBC
HCT: 35.2 % — ABNORMAL LOW (ref 36.0–46.0)
Hemoglobin: 12.3 g/dL (ref 12.0–15.0)
MCH: 30.5 pg (ref 26.0–34.0)
MCHC: 34.9 g/dL (ref 30.0–36.0)
MCV: 87.3 fL (ref 78.0–100.0)
Platelets: 320 10*3/uL (ref 150–400)
RBC: 4.03 MIL/uL (ref 3.87–5.11)
RDW: 12.9 % (ref 11.5–15.5)
WBC: 6.4 10*3/uL (ref 4.0–10.5)

## 2011-09-19 LAB — DIFFERENTIAL
Basophils Absolute: 0 10*3/uL (ref 0.0–0.1)
Basophils Relative: 0 % (ref 0–1)
Eosinophils Absolute: 0.2 10*3/uL (ref 0.0–0.7)
Eosinophils Relative: 3 % (ref 0–5)
Lymphocytes Relative: 67 % — ABNORMAL HIGH (ref 12–46)
Lymphs Abs: 4.3 10*3/uL — ABNORMAL HIGH (ref 0.7–4.0)
Monocytes Absolute: 0.5 10*3/uL (ref 0.1–1.0)
Monocytes Relative: 8 % (ref 3–12)
Neutro Abs: 1.4 10*3/uL — ABNORMAL LOW (ref 1.7–7.7)
Neutrophils Relative %: 22 % — ABNORMAL LOW (ref 43–77)

## 2011-09-19 LAB — LIPASE, BLOOD: Lipase: 25 U/L (ref 11–59)

## 2011-09-19 MED ORDER — ONDANSETRON HCL 4 MG PO TABS: 4.0000 mg | ORAL_TABLET | Freq: Four times a day (QID) | ORAL | Status: AC

## 2011-09-19 MED ORDER — MORPHINE SULFATE 4 MG/ML IJ SOLN
4.0000 mg | Freq: Once | INTRAMUSCULAR | Status: AC
  Administered 2011-09-19: 4 mg via INTRAVENOUS
  Filled 2011-09-19: qty 1

## 2011-09-19 MED ORDER — OMEPRAZOLE 20 MG PO CPDR: 20.0000 mg | DELAYED_RELEASE_CAPSULE | Freq: Every day | ORAL | Status: DC

## 2011-09-19 MED ORDER — PANTOPRAZOLE SODIUM 40 MG IV SOLR
40.0000 mg | Freq: Once | INTRAVENOUS | Status: AC
  Administered 2011-09-19: 40 mg via INTRAVENOUS
  Filled 2011-09-19: qty 40

## 2011-09-19 MED ORDER — ONDANSETRON HCL 4 MG/2ML IJ SOLN
4.0000 mg | Freq: Once | INTRAMUSCULAR | Status: AC
  Administered 2011-09-19: 4 mg via INTRAVENOUS
  Filled 2011-09-19: qty 2

## 2011-09-19 MED ORDER — SODIUM CHLORIDE 0.9 % IV BOLUS (SEPSIS)
1000.0000 mL | Freq: Once | INTRAVENOUS | Status: AC
  Administered 2011-09-19: 1000 mL via INTRAVENOUS

## 2011-09-19 NOTE — ED Provider Notes (Signed)
History     CSN: [id number]  Arrival date & time 09/19/11  1015   First MD Initiated Contact with Patient 09/19/11 1029      Chief Complaint  Patient presents with  . Abdominal Pain  . Emesis   Patient with a known history of "ulcer disease." She states she normally takes protonix and over-the-counter Zantac. She is currently out of her proton pump inhibitor. She does not have a GI doctor currently. She also admits to heavy drinking on Saturday, when it was her birthday. She describes a pain in her epigastric area as a "catch" she, states she's been having vomiting after eating for the past one day. She also had some mild cough. She's had no diarrhea. No chest pain. No difficulty breathing or fever. The epigastric pain is nonradiating. She has had no hematemesis or melena (Consider location/radiation/quality/duration/timing/severity/associated sxs/prior treatment) HPI  Past Medical History  Diagnosis Date  . Hypertension   . History of hysterectomy   . Migraine     No past surgical history on file.  No family history on file.  History  Substance Use Topics  . Smoking status: Not on file  . Smokeless tobacco: Not on file  . Alcohol Use: Not on file    OB History    Grav Para Term Preterm Abortions TAB SAB Ect Mult Living                  Review of Systems  All other systems reviewed and are negative.    Allergies  Diphenhydramine hcl  Home Medications   Current Outpatient Rx  Name Route Sig Dispense Refill  . ALBUTEROL SULFATE HFA 108 (90 BASE) MCG/ACT IN AERS Inhalation Inhale 2 puffs into the lungs every 6 (six) hours as needed. wheezing     . BECLOMETHASONE DIPROPIONATE 80 MCG/ACT IN AERS Inhalation Inhale 1 puff into the lungs as needed.      Marland Kitchen CLONAZEPAM 0.5 MG PO TABS Oral Take 0.5 mg by mouth 2 (two) times daily.      Marland Kitchen LAMOTRIGINE 100 MG PO TABS Oral Take 100 mg by mouth at bedtime.      Marland Kitchen QUETIAPINE FUMARATE 50 MG PO TABS Oral Take 100 mg by mouth at  bedtime. Pt takes 2 tabs for 100mg      . TRAMADOL HCL 50 MG PO TABS Oral Take 50 mg by mouth 2 (two) times daily as needed. Maximum dose= 8 tablets per day pain       BP 144/94  Pulse 70  Temp(Src) 97.6 F (36.4 C) (Oral)  Resp 14  SpO2 98%  Physical Exam  Nursing note and vitals reviewed. Constitutional: She is oriented to person, place, and time. She appears well-developed and well-nourished. No distress.  HENT:  Head: Normocephalic and atraumatic.  Eyes: Conjunctivae and EOM are normal. Pupils are equal, round, and reactive to light.  Neck: Neck supple.  Cardiovascular: Normal rate and regular rhythm.  Exam reveals no gallop and no friction rub.   No murmur heard. Pulmonary/Chest: Breath sounds normal. No respiratory distress. She has no wheezes. She has no rales. She exhibits no tenderness.  Abdominal: Soft. Bowel sounds are normal. She exhibits no distension. There is tenderness. There is no rebound and no guarding.       Moderate tenderness to deep palpation of the epigastric area. Negative Murphy's sign  Musculoskeletal: Normal range of motion.  Neurological: She is alert and oriented to person, place, and time. No cranial nerve deficit. Coordination  normal.  Skin: Skin is warm and dry. No rash noted. She is not diaphoretic.  Psychiatric: She has a normal mood and affect.    ED Course  Procedures (including critical care time)   Labs Reviewed  CBC  DIFFERENTIAL  COMPREHENSIVE METABOLIC PANEL  LIPASE, BLOOD   No results found.   No diagnosis found.    MDM  Patient is seen and examined, initial history and physical is completed. Evaluation initiated       Date: 09/19/2011  Rate: 67  Rhythm: normal sinus rhythm  QRS Axis: normal  Intervals: normal  ST/T Wave abnormalities: normal  Conduction Disutrbances:none  Narrative Interpretation:   Old EKG Reviewed: changes noted   Results for orders placed during the hospital encounter of 09/19/11  CBC       Component Value Range   WBC 6.4  4.0 - 10.5 (K/uL)   RBC 4.03  3.87 - 5.11 (MIL/uL)   Hemoglobin 12.3  12.0 - 15.0 (g/dL)   HCT 16.1 (*) 09.6 - 46.0 (%)   MCV 87.3  78.0 - 100.0 (fL)   MCH 30.5  26.0 - 34.0 (pg)   MCHC 34.9  30.0 - 36.0 (g/dL)   RDW 04.5  40.9 - 81.1 (%)   Platelets 320  150 - 400 (K/uL)  DIFFERENTIAL      Component Value Range   Neutrophils Relative 22 (*) 43 - 77 (%)   Neutro Abs 1.4 (*) 1.7 - 7.7 (K/uL)   Lymphocytes Relative 67 (*) 12 - 46 (%)   Lymphs Abs 4.3 (*) 0.7 - 4.0 (K/uL)   Monocytes Relative 8  3 - 12 (%)   Monocytes Absolute 0.5  0.1 - 1.0 (K/uL)   Eosinophils Relative 3  0 - 5 (%)   Eosinophils Absolute 0.2  0.0 - 0.7 (K/uL)   Basophils Relative 0  0 - 1 (%)   Basophils Absolute 0.0  0.0 - 0.1 (K/uL)  COMPREHENSIVE METABOLIC PANEL      Component Value Range   Sodium 139  135 - 145 (mEq/L)   Potassium 3.4 (*) 3.5 - 5.1 (mEq/L)   Chloride 103  96 - 112 (mEq/L)   CO2 26  19 - 32 (mEq/L)   Glucose, Bld 94  70 - 99 (mg/dL)   BUN 13  6 - 23 (mg/dL)   Creatinine, Ser 9.14  0.50 - 1.10 (mg/dL)   Calcium 9.5  8.4 - 78.2 (mg/dL)   Total Protein 7.9  6.0 - 8.3 (g/dL)   Albumin 3.6  3.5 - 5.2 (g/dL)   AST 20  0 - 37 (U/L)   ALT 15  0 - 35 (U/L)   Alkaline Phosphatase 67  39 - 117 (U/L)   Total Bilirubin 0.2 (*) 0.3 - 1.2 (mg/dL)   GFR calc non Af Amer 72 (*) >90 (mL/min)   GFR calc Af Amer 84 (*) >90 (mL/min)  LIPASE, BLOOD      Component Value Range   Lipase 25  11 - 59 (U/L)   No results found.  12:23 PM   Medications  QUEtiapine (SEROQUEL) 100 MG tablet (not administered)  omeprazole (PRILOSEC) 20 MG capsule (not administered)  ondansetron (ZOFRAN) 4 MG tablet (not administered)  sodium chloride 0.9 % bolus 1,000 mL (1000 mL Intravenous Given 09/19/11 1108)  morphine 4 MG/ML injection 4 mg (4 mg Intravenous Given 09/19/11 1108)  pantoprazole (PROTONIX) injection 40 mg (40 mg Intravenous Given 09/19/11 1109)  ondansetron (ZOFRAN) injection 4  mg (4 mg  Intravenous Given 09/19/11 1109)     Patient feels much better. Lab studies essentially normal, as documented above. Discharged home on a PPI, and Zofran, and referral to GI for outpatient follow    Caitlyn Buchanan A. Patrica Duel, MD 09/19/11 [phone]

## 2011-09-19 NOTE — ED Notes (Signed)
Pt states she started having abd pain epigastric all day yesterday continuing into today.  Also throws up after eating and can't keep anything down.  Used to take protonix.

## 2011-12-01 ENCOUNTER — Emergency Department (HOSPITAL_COMMUNITY)
Admission: EM | Admit: 2011-12-01 | Discharge: 2011-12-01 | Disposition: A | Discharge status: Home / Self Care | Payer: Medicaid Other | Attending: Emergency Medicine | Admitting: Emergency Medicine

## 2011-12-01 ENCOUNTER — Encounter (HOSPITAL_COMMUNITY): Payer: Self-pay | Admitting: *Deleted

## 2011-12-01 DIAGNOSIS — Z79899 Other long term (current) drug therapy: Secondary | ICD-10-CM | POA: Insufficient documentation

## 2011-12-01 DIAGNOSIS — R519 Headache, unspecified: Secondary | ICD-10-CM

## 2011-12-01 DIAGNOSIS — R51 Headache: Secondary | ICD-10-CM | POA: Insufficient documentation

## 2011-12-01 DIAGNOSIS — I10 Essential (primary) hypertension: Secondary | ICD-10-CM | POA: Insufficient documentation

## 2011-12-01 DIAGNOSIS — H53149 Visual discomfort, unspecified: Secondary | ICD-10-CM | POA: Insufficient documentation

## 2011-12-01 MED ORDER — METOCLOPRAMIDE HCL 5 MG/ML IJ SOLN
10.0000 mg | Freq: Once | INTRAMUSCULAR | Status: AC
  Administered 2011-12-01: 10 mg via INTRAVENOUS
  Filled 2011-12-01: qty 2

## 2011-12-01 MED ORDER — SODIUM CHLORIDE 0.9 % IV BOLUS (SEPSIS)
1000.0000 mL | Freq: Once | INTRAVENOUS | Status: AC
  Administered 2011-12-01: 1000 mL via INTRAVENOUS

## 2011-12-01 MED ORDER — DIPHENHYDRAMINE HCL 50 MG/ML IJ SOLN
25.0000 mg | Freq: Once | INTRAMUSCULAR | Status: DC
  Filled 2011-12-01: qty 1

## 2011-12-01 MED ORDER — DEXAMETHASONE SODIUM PHOSPHATE 10 MG/ML IJ SOLN
10.0000 mg | Freq: Once | INTRAMUSCULAR | Status: AC
Start: 2011-12-01 — End: 2011-12-01
  Administered 2011-12-01: 10 mg via INTRAVENOUS
  Filled 2011-12-01: qty 1

## 2011-12-01 NOTE — ED Notes (Signed)
C/o headache/migraine since March. Has been constant, will not go away. Stated that she's under a lot of stress past few months. Took 2 Excedrin at 0915. Reports has tried many OTC meds with no relief.  +photophobia, nausea no emesis, loud noises worsens pain

## 2011-12-01 NOTE — Discharge Instructions (Signed)
RESOURCE GUIDE  Dental Problems  Patients with Medicaid: [facility]                     [facility] [address]                                           [address] Phone:  [phone]                                                  Phone:  [phone]  If unable to pay or uninsured, contact:  [facility] or [facility] to become qualified for the adult dental clinic.  Chronic Pain Problems Contact [facility]  [phone] Patients need to be referred by their primary care doctor.  Insufficient Money for Medicine Contact [employer]:  call "211" or [facility] 271-5999.  No Primary Care Doctor Call [facility]  832-8000 Other agencies that provide inexpensive medical care    [facility]  [phone]    [facility]  [phone]    [facility]  [phone]    Women[facility] Clinic  [phone]    [facility]  [phone]    [facility]  [phone]  Psychological Services [facility]  [phone] [facility]  [phone] [facility]   800 853-5163 (emergency services 641-4993)  Substance Abuse Resources [facility]  336-882-2125 [facility] 336-784-9470 [facility] 336-285-9073 [facility] 336-845-3988 Residential & Outpatient Substance Abuse Program  800-659-3381  Abuse/Neglect [place] Child Abuse Hotline (336) 641-3795 [place] [facility] Abuse Hotline 800-378-5315 (After Hours)  Emergency Shelter [facility] (336) 271-5985  M[facility] (336) 275-9566 [facility] (704) 372-4663  MRSA Hotline #:   832-7006    [place] [facility]  [facility] [place] [facility]     [employer]                          [facility] [address] [place]                       [address]      [address]  [place]                                                [place]                            [place] Phone:  [phone]                                   Phone:  [phone]                 Phone:  [phone]  [facility] Phone:  [phone]    [facility] Child Abuse Hotline (570) 435-7570 904-373-4076 (After Hours)  Headaches, Frequently Asked Questions MIGRAINE HEADACHES Q: What is migraine? What causes it? How can I treat it? A: Generally, migraine headaches begin as a dull ache. Then they develop into a constant, throbbing, and pulsating pain. You may experience pain at the temples. You may experience pain at the front or back of one or both sides of the head. The pain is usually accompanied by a combination of:  Nausea.   Vomiting.   Sensitivity to light and noise.  Some people (about 15%) experience an aura (see below) before an attack. The cause of migraine is believed to be chemical reactions in the brain. Treatment for migraine may include over-the-counter or prescription medications. It may also include self-help techniques. These include relaxation training and biofeedback.  Q: What is an aura? A: About 15% of people with migraine get an "aura". This is a sign of neurological symptoms that occur before a migraine headache. You may see wavy or jagged lines, dots, or flashing lights. You might experience tunnel vision or blind spots in one or both eyes. The aura can include visual or auditory hallucinations (something imagined). It may include disruptions in smell (such as strange odors), taste or touch. Other symptoms include:  Numbness.   A "pins and needles" sensation.   Difficulty in recalling or speaking the correct word.  These neurological events may last as long as 60 minutes. These symptoms will fade as the headache begins. Q: What is a trigger? A: Certain physical or environmental factors can lead to or "trigger" a  migraine. These include:  Foods.   Hormonal changes.   Weather.   Stress.  It is important to remember that triggers are different for everyone. To help prevent migraine attacks, you need to figure out which triggers affect you. Keep a headache diary. This is a good way to track triggers. The diary will help you talk to your healthcare professional about your condition. Q: Does weather affect migraines? A: Bright sunshine, hot, humid conditions, and drastic changes in barometric pressure may lead to, or "trigger," a migraine attack in some people. But studies have shown that weather does not act as a trigger for everyone with migraines. Q: What is the link between migraine and hormones? A: Hormones start and regulate many of your body's functions. Hormones keep your body in balance within a constantly changing environment. The levels of hormones in your body are unbalanced at times. Examples are during menstruation, pregnancy, or menopause. That can lead to a migraine attack. In fact, about three quarters of all women with migraine report that their attacks are related to the menstrual cycle.  Q: Is there an increased risk of stroke for migraine sufferers? A: The likelihood of a migraine attack causing a stroke is very remote. That is not to say that migraine sufferers cannot have a stroke associated with their migraines. In persons under age 23, the most common associated factor for stroke is migraine headache. But over the course of a person's normal life span, the occurrence of migraine headache may actually be associated with a reduced risk of dying from cerebrovascular disease due to stroke.  Q: What are acute medications for migraine? A: Acute medications are used to treat the pain of the headache after it has started. Examples over-the-counter medications, NSAIDs, ergots, and triptans.  Q: What are the triptans? A: Triptans are the newest class of abortive medications. They are specifically  targeted to  treat migraine. Triptans are vasoconstrictors. They moderate some chemical reactions in the brain. The triptans work on receptors in your brain. Triptans help to restore the balance of a neurotransmitter called serotonin. Fluctuations in levels of serotonin are thought to be a main cause of migraine.  Q: Are over-the-counter medications for migraine effective? A: Over-the-counter, or "OTC," medications may be effective in relieving mild to moderate pain and associated symptoms of migraine. But you should see your caregiver before beginning any treatment regimen for migraine.  Q: What are preventive medications for migraine? A: Preventive medications for migraine are sometimes referred to as "prophylactic" treatments. They are used to reduce the frequency, severity, and length of migraine attacks. Examples of preventive medications include antiepileptic medications, antidepressants, beta-blockers, calcium channel blockers, and NSAIDs (nonsteroidal anti-inflammatory drugs). Q: Why are anticonvulsants used to treat migraine? A: During the past few years, there has been an increased interest in antiepileptic drugs for the prevention of migraine. They are sometimes referred to as "anticonvulsants". Both epilepsy and migraine may be caused by similar reactions in the brain.  Q: Why are antidepressants used to treat migraine? A: Antidepressants are typically used to treat people with depression. They may reduce migraine frequency by regulating chemical levels, such as serotonin, in the brain.  Q: What alternative therapies are used to treat migraine? A: The term "alternative therapies" is often used to describe treatments considered outside the scope of conventional Western medicine. Examples of alternative therapy include acupuncture, acupressure, and yoga. Another common alternative treatment is herbal therapy. Some herbs are believed to relieve headache pain. Always discuss alternative therapies  with your caregiver before proceeding. Some herbal products contain arsenic and other toxins. TENSION HEADACHES Q: What is a tension-type headache? What causes it? How can I treat it? A: Tension-type headaches occur randomly. They are often the result of temporary stress, anxiety, fatigue, or anger. Symptoms include soreness in your temples, a tightening band-like sensation around your head (a "vice-like" ache). Symptoms can also include a pulling feeling, pressure sensations, and contracting head and neck muscles. The headache begins in your forehead, temples, or the back of your head and neck. Treatment for tension-type headache may include over-the-counter or prescription medications. Treatment may also include self-help techniques such as relaxation training and biofeedback. CLUSTER HEADACHES Q: What is a cluster headache? What causes it? How can I treat it? A: Cluster headache gets its name because the attacks come in groups. The pain arrives with little, if any, warning. It is usually on one side of the head. A tearing or bloodshot eye and a runny nose on the same side of the headache may also accompany the pain. Cluster headaches are believed to be caused by chemical reactions in the brain. They have been described as the most severe and intense of any headache type. Treatment for cluster headache includes prescription medication and oxygen. SINUS HEADACHES Q: What is a sinus headache? What causes it? How can I treat it? A: When a cavity in the bones of the face and skull (a sinus) becomes inflamed, the inflammation will cause localized pain. This condition is usually the result of an allergic reaction, a tumor, or an infection. If your headache is caused by a sinus blockage, such as an infection, you will probably have a fever. An x-ray will confirm a sinus blockage. Your caregiver's treatment might include antibiotics for the infection, as well as antihistamines or decongestants.  REBOUND  HEADACHES Q: What is a rebound headache? What causes it? How  can I treat it? A: A pattern of taking acute headache medications too often can lead to a condition known as "rebound headache." A pattern of taking too much headache medication includes taking it more than 2 days per week or in excessive amounts. That means more than the label or a caregiver advises. With rebound headaches, your medications not only stop relieving pain, they actually begin to cause headaches. Doctors treat rebound headache by tapering the medication that is being overused. Sometimes your caregiver will gradually substitute a different type of treatment or medication. Stopping may be a challenge. Regularly overusing a medication increases the potential for serious side effects. Consult a caregiver if you regularly use headache medications more than 2 days per week or more than the label advises. ADDITIONAL QUESTIONS AND ANSWERS Q: What is biofeedback? A: Biofeedback is a self-help treatment. Biofeedback uses special equipment to monitor your body's involuntary physical responses. Biofeedback monitors:  Breathing.   Pulse.   Heart rate.   Temperature.   Muscle tension.   Brain activity.  Biofeedback helps you refine and perfect your relaxation exercises. You learn to control the physical responses that are related to stress. Once the technique has been mastered, you do not need the equipment any more. Q: Are headaches hereditary? A: Four out of five (80%) of people that suffer report a family history of migraine. Scientists are not sure if this is genetic or a family predisposition. Despite the uncertainty, a child has a 50% chance of having migraine if one parent suffers. The child has a 75% chance if both parents suffer.  Q: Can children get headaches? A: By the time they reach high school, most young people have experienced some type of headache. Many safe and effective approaches or medications can prevent a  headache from occurring or stop it after it has begun.  Q: What type of doctor should I see to diagnose and treat my headache? A: Start with your primary caregiver. Discuss his or her experience and approach to headaches. Discuss methods of classification, diagnosis, and treatment. Your caregiver may decide to recommend you to a headache specialist, depending upon your symptoms or other physical conditions. Having diabetes, allergies, etc., may require a more comprehensive and inclusive approach to your headache. The National Headache Foundation will provide, upon request, a list of [facility] physician members in your state. Document Released: 10/15/2003 Document Revised: 07/14/2011 Document Reviewed: 03/24/2008 [facility] Patient Information 2012 [place], [place].Headache, General, Unknown Cause The specific cause of your headache may not have been found today. There are many causes and types of headache. A few common ones are:  Tension headache.   Migraine.   Infections (examples: dental and sinus infections).   Bone and/or joint problems in the neck or jaw.   Depression.   Eye problems.  These headaches are not life threatening.  Headaches can sometimes be diagnosed by a patient history and a physical exam. Sometimes, lab and imaging studies (such as x-ray and/or CT scan) are used to rule out more serious problems. In some cases, a spinal tap (lumbar puncture) may be requested. There are many times when your exam and tests may be normal on the first visit even when there is a serious problem causing your headaches. Because of that, it is very important to follow up with your doctor or local clinic for further evaluation. FINDING OUT THE RESULTS OF TESTS  If a radiology test was performed, a radiologist will review your results.   You will be  contacted by the emergency department or your physician if any test results require a change in your treatment plan.   Not all test results may be available  during your visit. If your test results are not back during the visit, make an appointment with your caregiver to find out the results. Do not assume everything is normal if you have not heard from your caregiver or the medical facility. It is important for you to follow up on all of your test results.  HOME CARE INSTRUCTIONS   Keep follow-up appointments with your caregiver, or any specialist referral.   Only take over-the-counter or prescription medicines for pain, discomfort, or fever as directed by your caregiver.   Biofeedback, massage, or other relaxation techniques may be helpful.   Ice packs or heat applied to the head and neck can be used. Do this three to four times per day, or as needed.   Call your doctor if you have any questions or concerns.   If you smoke, you should quit.  SEEK MEDICAL CARE IF:   You develop problems with medications prescribed.   You do not respond to or obtain relief from medications.   You have a change from the usual headache.   You develop nausea or vomiting.  SEEK IMMEDIATE MEDICAL CARE IF:   If your headache becomes severe.   You have an unexplained oral temperature above 102 F (38.9 C), or as your caregiver suggests.   You have a stiff neck.   You have loss of vision.   You have muscular weakness.   You have loss of muscular control.   You develop severe symptoms different from your first symptoms.   You start losing your balance or have trouble walking.   You feel faint or pass out.  MAKE SURE YOU:   Understand these instructions.   Will watch your condition.   Will get help right away if you are not doing well or get worse.  Document Released: 07/25/2005 Document Revised: 07/14/2011 Document Reviewed: 03/13/2008 [facility] Patient Information 2012 [place], [place].Migraine Headache A migraine headache is an intense, throbbing pain on one or both sides of your head. The exact cause of a migraine headache is not always known.  A migraine may be caused when nerves in the brain become irritated and release chemicals that cause swelling within blood vessels, causing pain. Many migraine sufferers have a family history of migraines. Before you get a migraine you may or may not get an aura. An aura is a group of symptoms that can predict the beginning of a migraine. An aura may include:  Visual changes such as:   Flashing lights.   Bright spots or zig-zag lines.   Tunnel vision.   Feelings of numbness.   Trouble talking.   Muscle weakness.  SYMPTOMS  Pain on one or both sides of your head.   Pain that is pulsating or throbbing in nature.   Pain that is severe enough to prevent daily activities.   Pain that is aggravated by any daily physical activity.   Nausea (feeling sick to your stomach), vomiting, or both.   Pain with exposure to bright lights, loud noises, or activity.   General sensitivity to bright lights or loud noises.  MIGRAINE TRIGGERS Examples of triggers of migraine headaches include:   Alcohol.   Smoking.   Stress.   It may be related to menses (female menstruation).   Aged cheeses.   Foods or drinks that contain nitrates, glutamate, aspartame,  or tyramine.   Lack of sleep.   Chocolate.   Caffeine.   Hunger.   Medications such as nitroglycerine (used to treat chest pain), birth control pills, estrogen, and some blood pressure medications.  DIAGNOSIS  A migraine headache is often diagnosed based on:  Symptoms.   Physical examination.   A computerized X-ray scan (computed tomography, CT) of your head.  TREATMENT  Medications can help prevent migraines if they are recurrent or should they become recurrent. Your caregiver can help you with a medication or treatment program that will be helpful to you.   Lying down in a dark, quiet room may be helpful.   Keeping a headache diary may help you find a trend as to what may be triggering your headaches.  SEEK IMMEDIATE  MEDICAL CARE IF:   You have confusion, personality changes or seizures.   You have headaches that wake you from sleep.   You have an increased frequency in your headaches.   You have a stiff neck.   You have a loss of vision.   You have muscle weakness.   You start losing your balance or have trouble walking.   You feel faint or pass out.  MAKE SURE YOU:   Understand these instructions.   Will watch your condition.   Will get help right away if you are not doing well or get worse.  Document Released: 07/25/2005 Document Revised: 07/14/2011 Document Reviewed: 03/10/2009 [facility] Patient Information 2012 [place], [place].

## 2011-12-01 NOTE — ED Notes (Signed)
Pt reports migraines since March.  Reports last 3 days have been increased pain, nausea and feeling faint.  Reports pressure in forehead and behind eyes.  Pt ambulatory-laughing in triage.  Reports slight photophobia and noise sensitivity.  Reports OTC is not working.  Hx of migraines.

## 2011-12-01 NOTE — ED Provider Notes (Signed)
History     CSN: [id number]  Arrival date & time 12/01/11  1016   First MD Initiated Contact with Patient 12/01/11 1106      Chief Complaint  Patient presents with  . Migraine    Location-head/No radiation/quality-throbbing/duration-3 days/timing-intermittent/severity-moderate/associated sxs-photophobia/No recent treatment) Patient is a 46 y.o. female presenting with migraine. The history is provided by the patient. No language interpreter was used.  Migraine This is a recurrent problem. The current episode started yesterday. The problem occurs intermittently. The problem has been unchanged. Associated symptoms include headaches. Pertinent negatives include no abdominal pain, anorexia, chest pain, chills, congestion, coughing, diaphoresis, fatigue, fever, joint swelling, nausea, neck pain, numbness, rash, sore throat, swollen glands, vertigo, visual change, vomiting or weakness. Exacerbated by: Sherlynn Stalls. She has tried acetaminophen and NSAIDs for the symptoms. The treatment provided no relief.    Past Medical History  Diagnosis Date  . Hypertension   . History of hysterectomy   . Migraine     History reviewed. No pertinent past surgical history.  History reviewed. No pertinent family history.  History  Substance Use Topics  . Smoking status: Current Everyday Smoker -- 0.1 packs/day    Types: Cigarettes  . Smokeless tobacco: Not on file  . Alcohol Use: Yes    OB History    Grav Para Term Preterm Abortions TAB SAB Ect Mult Living                  Review of Systems  Constitutional: Negative for fever, chills, diaphoresis and fatigue.  HENT: Negative for congestion, sore throat, trouble swallowing, neck pain and neck stiffness.   Eyes: Positive for photophobia. Negative for pain, discharge, redness, itching and visual disturbance.  Respiratory: Negative for cough, chest tightness and shortness of breath.   Cardiovascular: Negative for chest pain, palpitations and leg  swelling.  Gastrointestinal: Negative for nausea, vomiting, abdominal pain, diarrhea, constipation, blood in stool and anorexia.  Genitourinary: Negative for dysuria, urgency, frequency, hematuria, flank pain, decreased urine volume, difficulty urinating and pelvic pain.  Musculoskeletal: Negative for back pain and joint swelling.  Skin: Negative for rash and wound.  Neurological: Positive for headaches. Negative for dizziness, vertigo, tremors, seizures, syncope, facial asymmetry, speech difficulty, weakness, light-headedness and numbness.  Hematological: Negative for adenopathy. Does not bruise/bleed easily.  Psychiatric/Behavioral: Negative for confusion and decreased concentration.    Allergies  Diphenhydramine hcl  Home Medications   Current Outpatient Rx  Name Route Sig Dispense Refill  . ALBUTEROL SULFATE HFA 108 (90 BASE) MCG/ACT IN AERS Inhalation Inhale 2 puffs into the lungs every 6 (six) hours as needed. wheezing     . BECLOMETHASONE DIPROPIONATE 80 MCG/ACT IN AERS Inhalation Inhale 1 puff into the lungs as needed. For shortness of breathing    . CLONAZEPAM 0.5 MG PO TABS Oral Take 0.5 mg by mouth daily as needed. For anxiety    . LAMOTRIGINE 100 MG PO TABS Oral Take 100 mg by mouth at bedtime.      Marland Kitchen LISINOPRIL-HYDROCHLOROTHIAZIDE 20-12.5 MG PO TABS Oral Take 1 tablet by mouth daily.    . MELOXICAM 15 MG PO TABS Oral Take 15 mg by mouth daily.    . QUETIAPINE FUMARATE 100 MG PO TABS Oral Take 100 mg by mouth at bedtime.    . TRAMADOL HCL 50 MG PO TABS Oral Take 50 mg by mouth 2 (two) times daily as needed. For pain; Maximum dose= 8 tablets per day pain      BP 125/89  Pulse  81  Temp(Src) 98.3 F (36.8 C) (Oral)  Resp 14  SpO2 98%  Physical Exam  Constitutional: She is oriented to person, place, and time. She appears well-developed and well-nourished. No distress.  HENT:  Head: Normocephalic and atraumatic.  Eyes: Conjunctivae are normal. Right eye exhibits no  discharge. Left eye exhibits no discharge. No scleral icterus.  Neck: Normal range of motion. Neck supple.  Cardiovascular: Normal rate, regular rhythm, normal heart sounds and intact distal pulses.   No murmur heard. Pulmonary/Chest: Effort normal and breath sounds normal. No respiratory distress.  Abdominal: Soft. Bowel sounds are normal. She exhibits no distension. There is no tenderness.  Musculoskeletal: Normal range of motion. She exhibits no tenderness.  Neurological: She is alert and oriented to person, place, and time. She has normal strength. No cranial nerve deficit or sensory deficit. Coordination normal. GCS eye subscore is 4. GCS verbal subscore is 5. GCS motor subscore is 6.  Skin: Skin is warm and dry. She is not diaphoretic.  Psychiatric: She has a normal mood and affect.    ED Course  Procedures (including critical care time)  Labs Reviewed - No data to display No results found.   1. HA (headache)     MDM  Pt is a well appearing 45yo AAF with reported hx of migraines here for HA identical to all other past migraines. VSS. AF. NAD. No focal neuro deficits. HA resolved with migraine cocktail. D/C home in stable and improved condition. PCP f/u info given.         Consuello Masse, MD 12/01/11 [phone]

## 2011-12-03 NOTE — ED Provider Notes (Signed)
Evaluation and management procedures were performed by the PA/NP/resident physician under my supervision/collaboration.   Jeff Frieden D Cyriah Childrey, MD 12/03/11 2052 

## 2012-03-30 ENCOUNTER — Emergency Department (HOSPITAL_COMMUNITY)
Admission: EM | Admit: 2012-03-30 | Discharge: 2012-03-30 | Disposition: A | Discharge status: Home / Self Care | Payer: Medicaid Other | Attending: Emergency Medicine | Admitting: Emergency Medicine

## 2012-03-30 ENCOUNTER — Encounter (HOSPITAL_COMMUNITY): Payer: Self-pay | Admitting: Emergency Medicine

## 2012-03-30 DIAGNOSIS — I10 Essential (primary) hypertension: Secondary | ICD-10-CM | POA: Insufficient documentation

## 2012-03-30 DIAGNOSIS — Z79899 Other long term (current) drug therapy: Secondary | ICD-10-CM | POA: Insufficient documentation

## 2012-03-30 DIAGNOSIS — F17200 Nicotine dependence, unspecified, uncomplicated: Secondary | ICD-10-CM | POA: Insufficient documentation

## 2012-03-30 DIAGNOSIS — R112 Nausea with vomiting, unspecified: Secondary | ICD-10-CM | POA: Insufficient documentation

## 2012-03-30 DIAGNOSIS — R51 Headache: Secondary | ICD-10-CM | POA: Insufficient documentation

## 2012-03-30 LAB — COMPREHENSIVE METABOLIC PANEL
ALT: 12 U/L (ref 0–35)
AST: 16 U/L (ref 0–37)
Albumin: 3.5 g/dL (ref 3.5–5.2)
Alkaline Phosphatase: 54 U/L (ref 39–117)
BUN: 12 mg/dL (ref 6–23)
CO2: 26 mEq/L (ref 19–32)
Calcium: 9 mg/dL (ref 8.4–10.5)
Chloride: 104 mEq/L (ref 96–112)
Creatinine, Ser: 0.79 mg/dL (ref 0.50–1.10)
GFR calc Af Amer: >90 mL/min (ref >90)
GFR calc non Af Amer: >90 mL/min (ref >90)
Glucose, Bld: 93 mg/dL (ref 70–99)
Potassium: 3.9 mEq/L (ref 3.5–5.1)
Sodium: 138 mEq/L (ref 135–145)
Total Bilirubin: 0.2 mg/dL — ABNORMAL LOW (ref 0.3–1.2)
Total Protein: 7.4 g/dL (ref 6.0–8.3)

## 2012-03-30 LAB — URINALYSIS, ROUTINE W REFLEX MICROSCOPIC
Bilirubin Urine: NEGATIVE
Glucose, UA: NEGATIVE mg/dL
Hgb urine dipstick: NEGATIVE
Ketones, ur: NEGATIVE mg/dL
Leukocytes, UA: NEGATIVE
Nitrite: NEGATIVE
Protein, ur: NEGATIVE mg/dL
Specific Gravity, Urine: 1.022 (ref 1.005–1.030)
Urobilinogen, UA: 0.2 mg/dL (ref 0.0–1.0)
pH: 8 (ref 5.0–8.0)

## 2012-03-30 LAB — CBC WITH DIFFERENTIAL/PLATELET
Basophils Absolute: 0 10*3/uL (ref 0.0–0.1)
Basophils Relative: 1 % (ref 0–1)
Eosinophils Absolute: 0.1 10*3/uL (ref 0.0–0.7)
Eosinophils Relative: 2 % (ref 0–5)
HCT: 33.9 % — ABNORMAL LOW (ref 36.0–46.0)
Hemoglobin: 11.6 g/dL — ABNORMAL LOW (ref 12.0–15.0)
Lymphocytes Relative: 54 % — ABNORMAL HIGH (ref 12–46)
Lymphs Abs: 3.4 10*3/uL (ref 0.7–4.0)
MCH: 30 pg (ref 26.0–34.0)
MCHC: 34.2 g/dL (ref 30.0–36.0)
MCV: 87.6 fL (ref 78.0–100.0)
Monocytes Absolute: 0.4 10*3/uL (ref 0.1–1.0)
Monocytes Relative: 6 % (ref 3–12)
Neutro Abs: 2.3 10*3/uL (ref 1.7–7.7)
Neutrophils Relative %: 37 % — ABNORMAL LOW (ref 43–77)
Platelets: 290 10*3/uL (ref 150–400)
RBC: 3.87 MIL/uL (ref 3.87–5.11)
RDW: 13.2 % (ref 11.5–15.5)
WBC: 6.2 10*3/uL (ref 4.0–10.5)

## 2012-03-30 MED ORDER — ONDANSETRON HCL 4 MG/2ML IJ SOLN
4.0000 mg | Freq: Once | INTRAMUSCULAR | Status: AC
  Administered 2012-03-30: 4 mg via INTRAVENOUS
  Filled 2012-03-30: qty 2

## 2012-03-30 MED ORDER — DEXAMETHASONE SODIUM PHOSPHATE 10 MG/ML IJ SOLN
10.0000 mg | Freq: Once | INTRAMUSCULAR | Status: AC
  Administered 2012-03-30: 10 mg via INTRAVENOUS
  Filled 2012-03-30: qty 1

## 2012-03-30 MED ORDER — FENTANYL CITRATE 0.05 MG/ML IJ SOLN
50.0000 ug | INTRAMUSCULAR | Status: AC
  Administered 2012-03-30: 50 ug via INTRAVENOUS
  Filled 2012-03-30: qty 2

## 2012-03-30 MED ORDER — DIPHENHYDRAMINE HCL 50 MG/ML IJ SOLN
25.0000 mg | Freq: Once | INTRAMUSCULAR | Status: DC
  Filled 2012-03-30 (×2): qty 1

## 2012-03-30 MED ORDER — METOCLOPRAMIDE HCL 5 MG/ML IJ SOLN
10.0000 mg | Freq: Once | INTRAMUSCULAR | Status: AC
  Administered 2012-03-30: 10 mg via INTRAVENOUS
  Filled 2012-03-30: qty 2

## 2012-03-30 NOTE — ED Provider Notes (Signed)
History     CSN: [id number]  Arrival date & time 03/30/12  1204   First MD Initiated Contact with Patient 03/30/12 1514      Chief Complaint  Patient presents with  . Nausea  . Emesis  . Migraine    (Consider location/radiation/quality/duration/timing/severity/associated sxs/prior treatment) HPI Comments: [name] [name] is a 46 y.o. Female who is here for vomiting. She has also had a daily headache for 7 days. She has headaches every day or every other day for 2 years. She's never seen a headache specialist. She thinks that she has migraines. She saw her PCP 4 days ago. Was put on oxycodone for back pain then. She is out of her Lamictal, which she takes for bipolar disorder. She denies fever, or neck pain. She has general achiness. She denies diarrhea. No problems walking, paresthesias, or weakness. 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 She is using her usual medications without relief. She reports having chronic back pain, secondary to degenerative joint disease.  Patient is a 46 y.o. female presenting with vomiting and migraine. The history is provided by the patient.  Emesis   Migraine    Past Medical History  Diagnosis Date  . Hypertension   . History of hysterectomy   . Migraine     History reviewed. No pertinent past surgical history.  No family history on file.  History  Substance Use Topics  . Smoking status: Current Everyday Smoker -- 0.1 packs/day    Types: Cigarettes  . Smokeless tobacco: Not on file  . Alcohol Use: Yes     every other day    OB History    Grav Para Term Preterm Abortions TAB SAB Ect Mult Living                  Review of Systems  Gastrointestinal: Positive for vomiting.  All other systems reviewed and are negative.    Allergies  Diphenhydramine hcl  Home Medications   Current Outpatient Rx  Name Route Sig Dispense Refill  . ALBUTEROL SULFATE HFA 108 (90 BASE) MCG/ACT IN AERS Inhalation Inhale 2  puffs into the lungs every 6 (six) hours as needed. wheezing     . BECLOMETHASONE DIPROPIONATE 80 MCG/ACT IN AERS Inhalation Inhale 1 puff into the lungs as needed. For shortness of breathing    . CLONAZEPAM 0.5 MG PO TABS Oral Take 0.5 mg by mouth daily as needed. For anxiety    . LAMOTRIGINE 100 MG PO TABS Oral Take 100 mg by mouth at bedtime.      Marland Kitchen LISINOPRIL-HYDROCHLOROTHIAZIDE 20-12.5 MG PO TABS Oral Take 1 tablet by mouth daily.    . OXYCODONE-ACETAMINOPHEN 5-500 MG PO TABS Oral Take 1 tablet by mouth every 8 (eight) hours as needed. For pain.    Marland Kitchen QUETIAPINE FUMARATE 100 MG PO TABS Oral Take 100 mg by mouth at bedtime.      BP 131/83  Pulse 77  Temp 98.4 F (36.9 C) (Oral)  Resp 18  SpO2 98%  Physical Exam  Nursing note and vitals reviewed. Constitutional: She is oriented to person, place, and time. She appears well-developed and well-nourished.  HENT:  Head: Normocephalic and atraumatic.  Eyes: Conjunctivae and EOM are normal. Pupils are equal, round, and reactive to light.  Neck: Normal range of motion and phonation normal. Neck supple.       No meningismus  Cardiovascular: Normal rate, regular rhythm and intact distal pulses.   Pulmonary/Chest: Effort normal  and breath sounds normal. She exhibits no tenderness.  Abdominal: Soft. She exhibits no distension. There is no tenderness. There is no guarding.  Musculoskeletal: Normal range of motion.  Neurological: She is alert and oriented to person, place, and time. She has normal strength. No cranial nerve deficit. She exhibits normal muscle tone. Coordination normal.  Skin: Skin is warm and dry. No rash noted. No erythema.  Psychiatric: She has a normal mood and affect. Her behavior is normal. Judgment and thought content normal.    ED Course  Procedures (including critical care time) Emergency department treatment: Reglan, Decadron, Zofran, and fentanyl, IV  Reevaluation: At discharge. Her headache was resolved, and she  was tolerating liquids and food.    Labs Reviewed  URINALYSIS, ROUTINE W REFLEX MICROSCOPIC - Abnormal; Notable for the following:    APPearance CLOUDY (*)     All other components within normal limits  CBC WITH DIFFERENTIAL - Abnormal; Notable for the following:    Hemoglobin 11.6 (*)     HCT 33.9 (*)     Neutrophils Relative 37 (*)     Lymphocytes Relative 54 (*)     All other components within normal limits  COMPREHENSIVE METABOLIC PANEL - Abnormal; Notable for the following:    Total Bilirubin 0.2 (*)     All other components within normal limits  LAB REPORT - SCANNED      1. Headache       MDM  Nonspecific recurrent headache, unlikely to be migraine. Doubt intracranial lesion, meningitis, encephalopathy, or traumatic injury. Patient stable for discharge with outpatient management.   Plan: Home Medications- usual; Home Treatments- rest; Recommended follow up- Neurology f/u for HA evaluation        Flint Melter, MD 03/31/12 [phone]

## 2012-03-30 NOTE — ED Notes (Signed)
Pt presenting to ed with c/o nausea and vomiting and a migraine headache. Pt states she does not feel herself onset this morning. Pt states she has had chills one minute 'I'm hot and one minute of cold. Pt denies fever. Pt states she had chronic back pain and is currently having back pain. Pt states sensitivity to noise and a little to light. Pt denies abdominal pain at this time

## 2012-04-11 ENCOUNTER — Emergency Department (HOSPITAL_COMMUNITY)
Admission: EM | Admit: 2012-04-11 | Discharge: 2012-04-11 | Disposition: A | Discharge status: Home / Self Care | Payer: Self-pay | Source: Home / Self Care | Attending: Emergency Medicine | Admitting: Emergency Medicine

## 2012-04-11 ENCOUNTER — Emergency Department (INDEPENDENT_AMBULATORY_CARE_PROVIDER_SITE_OTHER): Payer: Medicaid Other

## 2012-04-11 ENCOUNTER — Emergency Department (HOSPITAL_COMMUNITY): Payer: Medicaid Other

## 2012-04-11 ENCOUNTER — Encounter (HOSPITAL_COMMUNITY): Payer: Self-pay

## 2012-04-11 DIAGNOSIS — IMO0002 Reserved for concepts with insufficient information to code with codable children: Secondary | ICD-10-CM

## 2012-04-11 MED ORDER — IBUPROFEN 800 MG PO TABS: 800.0000 mg | ORAL_TABLET | Freq: Three times a day (TID) | ORAL | Status: AC

## 2012-04-11 NOTE — ED Provider Notes (Signed)
History     CSN: [id number]  Arrival date & time 04/11/12  1439   First MD Initiated Contact with Patient 04/11/12 1516      Chief Complaint  Patient presents with  . Knee Injury    (Consider location/radiation/quality/duration/timing/severity/associated sxs/prior treatment) HPI Comments: Patient reports about 5 days ago she fell while she was intoxicated injuring her right knee: She can't recall exactly what happened. Describes pain in the posterior aspect of her knee and shoots up towards the middle aspect of her right upper thigh. Patient denies any clicking or popping noises. Does hurt when she puts weight on her knee. Patient denies any weakness or numbness or tingling sensation to her right lower extremity. No bruising or superficial lacerations occurred during the fall.  Patient is a 46 y.o. female presenting with knee pain. The history is provided by the patient. No language interpreter was used.  Knee Pain This is a new problem. The problem occurs constantly. The problem has been gradually worsening. The symptoms are aggravated by walking. The treatment provided no relief.    Past Medical History  Diagnosis Date  . Hypertension   . History of hysterectomy   . Migraine     History reviewed. No pertinent past surgical history.  History reviewed. No pertinent family history.  History  Substance Use Topics  . Smoking status: Current Everyday Smoker -- 0.1 packs/day    Types: Cigarettes  . Smokeless tobacco: Not on file  . Alcohol Use: Yes     every other day    OB History    Grav Para Term Preterm Abortions TAB SAB Ect Mult Living                  Review of Systems  Constitutional: Negative for chills, diaphoresis, activity change, appetite change and unexpected weight change.  HENT: Negative for neck pain.   Musculoskeletal: Positive for arthralgias. Negative for myalgias, back pain, joint swelling and gait problem.  Skin: Negative for pallor and rash.     Allergies  Diphenhydramine hcl  Home Medications   Current Outpatient Rx  Name Route Sig Dispense Refill  . ALBUTEROL SULFATE HFA 108 (90 BASE) MCG/ACT IN AERS Inhalation Inhale 2 puffs into the lungs every 6 (six) hours as needed. wheezing     . BECLOMETHASONE DIPROPIONATE 80 MCG/ACT IN AERS Inhalation Inhale 1 puff into the lungs as needed. For shortness of breathing    . CLONAZEPAM 0.5 MG PO TABS Oral Take 0.5 mg by mouth daily as needed. For anxiety    . LAMOTRIGINE 100 MG PO TABS Oral Take 100 mg by mouth at bedtime.      Marland Kitchen LISINOPRIL-HYDROCHLOROTHIAZIDE 20-12.5 MG PO TABS Oral Take 1 tablet by mouth daily.    . IBUPROFEN 800 MG PO TABS Oral Take 1 tablet (800 mg total) by mouth 3 (three) times daily. 21 tablet 0  . OXYCODONE-ACETAMINOPHEN 5-500 MG PO TABS Oral Take 1 tablet by mouth every 8 (eight) hours as needed. For pain.    Marland Kitchen QUETIAPINE FUMARATE 100 MG PO TABS Oral Take 100 mg by mouth at bedtime.      BP 147/98  Pulse 76  Temp 98 F (36.7 C) (Oral)  Resp 20  SpO2 98%  Physical Exam  Nursing note and vitals reviewed. Constitutional: She appears well-developed and well-nourished.  Neck: Neck supple.  Musculoskeletal: She exhibits tenderness.       Right lower leg: She exhibits tenderness and bony tenderness. She exhibits no swelling,  no edema, no deformity and no laceration.       Legs: Skin: Skin is warm.    ED Course  Procedures (including critical care time)  Labs Reviewed - No data to display Dg Knee Complete 4 Views Right  04/11/2012  *RADIOLOGY REPORT*  Clinical Data: Fall  RIGHT KNEE - COMPLETE 4+ VIEW  Comparison:  10/24/2006  Findings:  There is no evidence of fracture, dislocation, or joint effusion.  There is no evidence of arthropathy or other focal bone abnormality.  Soft tissues are unremarkable.  IMPRESSION: Negative.   Original Report Authenticated By: Camelia Phenes, M.D.      1. Knee sprain and strain       MDM  Posterior knee sprain  popliteal area. Patient with active full range of motion no clinical or radiological knee effusion. No increased laxity with knee maneuvers. Patient was put on a knee sleeve encouraged to use nasally 57 days along with an anti-inflammatory pressure regimen to 5 days. Encouraged to followup with the orthopedic provider if pain persists beyond 5-7 days. Or worsening        Jimmie Molly, MD 04/11/12 [phone]

## 2012-04-11 NOTE — ED Notes (Addendum)
States she fell on 8-30 PM while out w ETOH on system, injury to post right knee, and still has pain; NAD, pain worse w ambulation, ROM leg

## 2013-03-05 ENCOUNTER — Encounter (HOSPITAL_COMMUNITY): Payer: Self-pay

## 2013-03-05 ENCOUNTER — Emergency Department (HOSPITAL_COMMUNITY)
Admission: EM | Admit: 2013-03-05 | Discharge: 2013-03-05 | Disposition: A | Discharge status: Home / Self Care | Payer: Medicaid Other | Attending: Emergency Medicine | Admitting: Emergency Medicine

## 2013-03-05 DIAGNOSIS — M79605 Pain in left leg: Secondary | ICD-10-CM

## 2013-03-05 DIAGNOSIS — Z79899 Other long term (current) drug therapy: Secondary | ICD-10-CM | POA: Insufficient documentation

## 2013-03-05 DIAGNOSIS — F17200 Nicotine dependence, unspecified, uncomplicated: Secondary | ICD-10-CM | POA: Insufficient documentation

## 2013-03-05 DIAGNOSIS — IMO0002 Reserved for concepts with insufficient information to code with codable children: Secondary | ICD-10-CM | POA: Insufficient documentation

## 2013-03-05 DIAGNOSIS — G8929 Other chronic pain: Secondary | ICD-10-CM | POA: Insufficient documentation

## 2013-03-05 MED ORDER — OXYCODONE-ACETAMINOPHEN 5-325 MG PO TABS
1.0000 | ORAL_TABLET | Freq: Once | ORAL | Status: AC
  Administered 2013-03-05: 1 via ORAL
  Filled 2013-03-05: qty 1

## 2013-03-05 MED ORDER — OXYCODONE-ACETAMINOPHEN 5-325 MG PO TABS: 1.0000 | ORAL_TABLET | ORAL | Status: DC | PRN

## 2013-03-05 NOTE — ED Provider Notes (Signed)
CSN: [id number]     Arrival date & time 03/05/13  1340 History     First MD Initiated Contact with Patient 03/05/13 1354     Chief Complaint  Patient presents with  . Pain   (Consider location/radiation/quality/duration/timing/severity/associated sxs/prior Treatment) Patient is a 47 y.o. female presenting with leg pain.  Leg Pain Location:  Leg Time since incident:  4 weeks Injury: no   Leg location:  L leg Pain details:    Severity:  Severe   Onset quality:  Gradual   Timing:  Constant   Progression:  Worsening Chronicity:  New Associated symptoms: back pain   Associated symptoms: no fever     Past Medical History  Diagnosis Date  . Hypertension   . History of hysterectomy   . Migraine   . Chronic pain    Past Surgical History  Procedure Laterality Date  . Abdominal hysterectomy     No family history on file. History  Substance Use Topics  . Smoking status: Current Every Day Smoker -- 0.10 packs/day    Types: Cigarettes  . Smokeless tobacco: Not on file  . Alcohol Use: 14.4 oz/week    24 Cans of beer per week     Comment: every day   OB History   Grav Para Term Preterm Abortions TAB SAB Ect Mult Living                 Review of Systems  Constitutional: Negative for fever and unexpected weight change.  Cardiovascular: Negative for chest pain.  Gastrointestinal: Negative for abdominal pain.  Genitourinary: Negative for dysuria and frequency.  Musculoskeletal: Positive for back pain.       Left leg pain  Neurological: Negative for weakness and numbness.  All other systems reviewed and are negative.    Allergies  Diphenhydramine hcl  Home Medications   Current Outpatient Rx  Name  Route  Sig  Dispense  Refill  . albuterol (PROVENTIL HFA;VENTOLIN HFA) 108 (90 BASE) MCG/ACT inhaler   Inhalation   Inhale 2 puffs into the lungs every 6 (six) hours as needed for wheezing or shortness of breath.          . beclomethasone (QVAR) 80 MCG/ACT inhaler   Inhalation   Inhale 1 puff into the lungs 2 (two) times daily as needed (for shortness of breath).          Marland Kitchen ibuprofen (ADVIL,MOTRIN) 200 MG tablet   Oral   Take 800 mg by mouth every 8 (eight) hours as needed for pain.         Marland Kitchen lamoTRIgine (LAMICTAL) 100 MG tablet   Oral   Take 100 mg by mouth at bedtime.           Marland Kitchen lisinopril-hydrochlorothiazide (PRINZIDE,ZESTORETIC) 20-12.5 MG per tablet   Oral   Take 1 tablet by mouth daily.         . QUEtiapine (SEROQUEL) 100 MG tablet   Oral   Take 100 mg by mouth at bedtime.          BP 141/94  Pulse 80  Temp(Src) 97.5 F (36.4 C) (Oral)  Resp 20  SpO2 99% Physical Exam  Vitals reviewed. Constitutional: She is oriented to person, place, and time. She appears well-developed and well-nourished. No distress.  HENT:  Head: Normocephalic and atraumatic.  Right Ear: External ear normal.  Left Ear: External ear normal.  Nose: Nose normal.  Eyes: Right eye exhibits no discharge. Left eye exhibits no discharge.  Neck:  Neck supple.  Cardiovascular: Normal rate, regular rhythm, normal heart sounds and intact distal pulses.   Pulses:      Dorsalis pedis pulses are 2+ on the right side, and 2+ on the left side.  Pulmonary/Chest: Effort normal and breath sounds normal.  Abdominal: Soft. She exhibits no distension. There is no tenderness.  Musculoskeletal: She exhibits no edema.       Lumbar back: She exhibits decreased range of motion (mild when twisting to the right). She exhibits no tenderness and no bony tenderness.  Neurological: She is alert and oriented to person, place, and time. She has normal strength. No sensory deficit. She exhibits normal muscle tone. GCS eye subscore is 4. GCS verbal subscore is 5. GCS motor subscore is 6. She displays no Babinski's sign on the right side. She displays no Babinski's sign on the left side.  Reflex Scores:      Patellar reflexes are 2+ on the right side and 2+ on the left side.       Achilles reflexes are 2+ on the right side and 2+ on the left side. 5/5 strength in BLE. Able to stand on heels and toes w/o assistance.  Skin: Skin is warm and dry. She is not diaphoretic.    ED Course   Procedures (including critical care time)  Labs Reviewed - No data to display No results found. 1. Left leg pain     MDM  47 yo female with worsening left leg pain radiating to toes over past 4 weeks. Does not recall injury. No red flags such as fever, weight loss or hx of cancer. Benign abd exam. Neuro exam is normal, including reflexes and strength. No B/B incontinence or saddle anesthesia. With normal exam besides pain I feel she is at a low likelihood of acute spinal emergency such as cauda equina, epidural abscess, discitis, etc. This is most c/w sciatica. Offered IM narcotics for acute pain but patient declined, was given po percocet here and short course of narcotics to get to her PCP.  Audree Camel, MD 03/05/13 [phone]

## 2013-03-05 NOTE — ED Notes (Signed)
Pt c/o lt hip/back pain radiating down lt leg x66months, states goes to pain management but unable to take suboxine

## 2013-03-05 NOTE — ED Notes (Signed)
Pt states no new injury, [place] and pain off and on

## 2013-04-20 DIAGNOSIS — M1990 Unspecified osteoarthritis, unspecified site: Secondary | ICD-10-CM | POA: Insufficient documentation

## 2013-04-20 DIAGNOSIS — S8010XA Contusion of unspecified lower leg, initial encounter: Secondary | ICD-10-CM | POA: Insufficient documentation

## 2013-04-20 DIAGNOSIS — F1210 Cannabis abuse, uncomplicated: Secondary | ICD-10-CM | POA: Insufficient documentation

## 2013-04-20 DIAGNOSIS — Z79899 Other long term (current) drug therapy: Secondary | ICD-10-CM | POA: Insufficient documentation

## 2013-04-20 DIAGNOSIS — Y92009 Unspecified place in unspecified non-institutional (private) residence as the place of occurrence of the external cause: Secondary | ICD-10-CM | POA: Insufficient documentation

## 2013-04-20 DIAGNOSIS — W010XXA Fall on same level from slipping, tripping and stumbling without subsequent striking against object, initial encounter: Secondary | ICD-10-CM | POA: Insufficient documentation

## 2013-04-20 DIAGNOSIS — IMO0002 Reserved for concepts with insufficient information to code with codable children: Secondary | ICD-10-CM | POA: Insufficient documentation

## 2013-04-20 DIAGNOSIS — Z8669 Personal history of other diseases of the nervous system and sense organs: Secondary | ICD-10-CM | POA: Insufficient documentation

## 2013-04-20 DIAGNOSIS — I10 Essential (primary) hypertension: Secondary | ICD-10-CM | POA: Insufficient documentation

## 2013-04-20 DIAGNOSIS — F17200 Nicotine dependence, unspecified, uncomplicated: Secondary | ICD-10-CM | POA: Insufficient documentation

## 2013-04-20 DIAGNOSIS — F1010 Alcohol abuse, uncomplicated: Secondary | ICD-10-CM | POA: Insufficient documentation

## 2013-04-20 DIAGNOSIS — M5430 Sciatica, unspecified side: Secondary | ICD-10-CM | POA: Insufficient documentation

## 2013-04-20 DIAGNOSIS — Y939 Activity, unspecified: Secondary | ICD-10-CM | POA: Insufficient documentation

## 2013-04-21 ENCOUNTER — Encounter (HOSPITAL_COMMUNITY): Payer: Self-pay | Admitting: Emergency Medicine

## 2013-04-21 ENCOUNTER — Emergency Department (HOSPITAL_COMMUNITY): Payer: Medicaid Other

## 2013-04-21 ENCOUNTER — Emergency Department (HOSPITAL_COMMUNITY)
Admission: EM | Admit: 2013-04-21 | Discharge: 2013-04-21 | Disposition: A | Discharge status: Home / Self Care | Payer: Medicaid Other | Attending: Emergency Medicine | Admitting: Emergency Medicine

## 2013-04-21 DIAGNOSIS — S8011XA Contusion of right lower leg, initial encounter: Secondary | ICD-10-CM

## 2013-04-21 DIAGNOSIS — S8391XA Sprain of unspecified site of right knee, initial encounter: Secondary | ICD-10-CM

## 2013-04-21 DIAGNOSIS — F10929 Alcohol use, unspecified with intoxication, unspecified: Secondary | ICD-10-CM

## 2013-04-21 DIAGNOSIS — F1210 Cannabis abuse, uncomplicated: Secondary | ICD-10-CM

## 2013-04-21 HISTORY — DX: Sciatica, unspecified side: M54.30

## 2013-04-21 HISTORY — DX: Reserved for concepts with insufficient information to code with codable children: IMO0002

## 2013-04-21 HISTORY — DX: Unspecified osteoarthritis, unspecified site: M19.90

## 2013-04-21 MED ORDER — IBUPROFEN 600 MG PO TABS: 600.0000 mg | ORAL_TABLET | Freq: Four times a day (QID) | ORAL | Status: DC | PRN

## 2013-04-21 MED ORDER — TRAMADOL HCL 50 MG PO TABS: 50.0000 mg | ORAL_TABLET | Freq: Four times a day (QID) | ORAL | Status: DC | PRN

## 2013-04-21 MED ORDER — IBUPROFEN 800 MG PO TABS
800.0000 mg | ORAL_TABLET | Freq: Once | ORAL | Status: AC
  Administered 2013-04-21: 800 mg via ORAL
  Filled 2013-04-21: qty 1

## 2013-04-21 NOTE — ED Notes (Signed)
Pt given d/c instructions and verbalized understanding. NAD at this time.  

## 2013-04-21 NOTE — ED Provider Notes (Signed)
CSN: [id number]     Arrival date & time 04/20/13  2356 History   First MD Initiated Contact with Patient 04/21/13 0055     Chief Complaint  Patient presents with  . Fall   (Consider location/radiation/quality/duration/timing/severity/associated sxs/prior Treatment) HPI  Patient is a 47 yo woman with history of several chronic pain issues including DDD, neuropathy, migraine headaches, hypertension.   She presents with complaints of right knee pain following fall from standing. The patient was ambulating on dirt surface when she lost her balance, fell to her right side and landed against her right side. She notes that she is quite intoxicated and states multiple times that she has been drinking alcohol and smoking weed.   She says her leg is really, really hurting for real. Pain is aching, worse with weight bearing, localizes to the lateral aspect of the right knee, moderately severe. Patient denies head trauma and injury to any other region.   Past Medical History  Diagnosis Date  . Hypertension   . History of hysterectomy   . Migraine   . Chronic pain   . DDD (degenerative disc disease)   . Neuropathy   . Sciatica   . Osteoarthritis    Past Surgical History  Procedure Laterality Date  . Abdominal hysterectomy     No family history on file. History  Substance Use Topics  . Smoking status: Current Every Day Smoker -- 0.10 packs/day    Types: Cigarettes  . Smokeless tobacco: Not on file  . Alcohol Use: 14.4 oz/week    24 Cans of beer per week     Comment: every day   OB History   Grav Para Term Preterm Abortions TAB SAB Ect Mult Living                 Review of Systems  Allergies  Diphenhydramine hcl  Home Medications   Current Outpatient Rx  Name  Route  Sig  Dispense  Refill  . ibuprofen (ADVIL,MOTRIN) 200 MG tablet   Oral   Take 800 mg by mouth every 8 (eight) hours as needed for pain.         Marland Kitchen lamoTRIgine (LAMICTAL) 100 MG tablet   Oral   Take 100 mg  by mouth at bedtime.           Marland Kitchen lisinopril-hydrochlorothiazide (PRINZIDE,ZESTORETIC) 20-12.5 MG per tablet   Oral   Take 1 tablet by mouth daily.         . QUEtiapine (SEROQUEL) 100 MG tablet   Oral   Take 100 mg by mouth at bedtime.          BP 143/85  Pulse 83  Temp(Src) 97.9 F (36.6 C) (Oral)  Resp 18  SpO2 100% Physical Exam Gen: well developed and well nourished appearing, the patient is intoxicated appearing and has a rather inappropriate affect. Laying on gurney in left lateral decubitus position. Head: NCAT Eyes: PERL, EOMI Nose: no epistaixis or rhinorrhea Mouth/throat: mucosa is moist and pink him a no intraoral trauma identified Neck: supple, no stridor, no cervical spine tenderness Lungs: CTA B, no wheezing, rhonchi or rales CV: Regular rate and rhythm, no murmur, extremities appear well perfused Abd: soft, notender, nondistended Back: no ttp, no cva ttp Skin: Warm and dry, no abrasions, bruising or lacerations noted. Neuro: CN ii-xii grossly intact, no focal deficits, slurred speech. Psyche: cooperative.  Ext: no deformities, mild ttp over the lateral aspect of the right knee, no effusion, no laxity, mild ttp over  the region of the tibial plateau. FROM all joints x all 4 ext.  ED Course  Procedures (including critical care time) Labs Review DG Knee Complete 4 Views Right (Final result)  Result time: 04/21/13 01:50:17    Final result by Rad Results In Interface (04/21/13 01:50:17)    Narrative:   *RADIOLOGY REPORT*  Clinical Data: Fall  RIGHT KNEE - COMPLETE 4+ VIEW  Comparison: None.  Findings: There is no acute fracture or dislocation. No significant joint effusion. Joint spaces are well maintained. Osseous mineralization is normal. No soft tissue abnormality.  IMPRESSION:  No acute fracture or dislocation.   Original Report Authenticated By: Rise Mu, M.D.             DG Tibia/Fibula Right (Final result)  Result time:  04/21/13 01:48:47    Final result by Rad Results In Interface (04/21/13 01:48:47)    Narrative:   *RADIOLOGY REPORT*  Clinical Data: Trauma, pain  RIGHT TIBIA AND FIBULA - 2 VIEW  Comparison: Prior radiograph from 10/27/2009.  Findings: There is no acute fracture or dislocation. Limited views of the knee are unremarkable. No soft tissue abnormality. Osseous mineralization is within normal limits.  A metallic density is seen overlying the posterior aspect of the right calcaneus, which may be postsurgical in nature. Irregularity is seen at the posterior aspect of the calcaneus, slightly more prominent compared to prior examination.  IMPRESSION:  1. No acute fracture or dislocation. 2. Metallic density overlying the right calcaneus. Query prior surgical intervention at this site. Correlation with history and physical exam is recommended.   Original Report Authenticated By: Rise Mu, M.D.       MDM  Patient is acute intoxicated with what appears to be a contusion to the right knee and proximal lower leg with possible mild knee sprain. Do not suspect fx but, in light of limited exam due to intoxicated state, we will obtain plain films. Patient is able to bear weight. We will tx with ibuprofen while awaiting plain films. Anticipate d/c home. The patient's adult son and significant other are here with her and should be able to take her home following work up.     Brandt Loosen, MD 04/21/13 [phone]

## 2013-04-21 NOTE — ED Notes (Addendum)
Pt. slipped at steps and fell at home this evening , no LOC , reports pain at entire right leg . Pt. states history of chronic pain . Drank alcohol this evening .

## 2013-04-21 NOTE — ED Provider Notes (Signed)
CSN: [id number]     Arrival date & time 04/20/13  2356 History   First MD Initiated Contact with Patient 04/21/13 0055     Chief Complaint  Patient presents with  . Fall   (Consider location/radiation/quality/duration/timing/severity/associated sxs/prior Treatment) HPI Comments: 47 yo AA female presents with RLE pain. Pt has longstanding h/o sciatica and DDD.  She has been drinking EtOH tonight.  Of note pt was seen here in ER last night for similar symptoms.  ER w/u was negative.    Currently pt states similar history - she had been "drinking EtOH and smoking weed" and now is c/o pain in RLE.    She denies CHI, HA, neck pain, cp, abd pain, pelvic pain.    Patient is a 47 y.o. female presenting with fall. The history is provided by the patient.  Fall This is a new problem. The current episode started less than 1 hour ago. The problem has not changed since onset.Pertinent negatives include no chest pain, no abdominal pain, no headaches and no shortness of breath. Nothing aggravates the symptoms. The symptoms are relieved by medications. She has tried nothing for the symptoms.    Past Medical History  Diagnosis Date  . Hypertension   . History of hysterectomy   . Migraine   . Chronic pain   . DDD (degenerative disc disease)   . Neuropathy   . Sciatica   . Osteoarthritis    Past Surgical History  Procedure Laterality Date  . Abdominal hysterectomy     No family history on file. History  Substance Use Topics  . Smoking status: Current Every Day Smoker -- 0.10 packs/day    Types: Cigarettes  . Smokeless tobacco: Not on file  . Alcohol Use: 14.4 oz/week    24 Cans of beer per week     Comment: every day   OB History   Grav Para Term Preterm Abortions TAB SAB Ect Mult Living                 Review of Systems  Constitutional: Negative.  Negative for fever, chills, diaphoresis, activity change, appetite change, fatigue and unexpected weight change.  HENT: Negative.   Eyes:  Negative.   Respiratory: Negative.  Negative for apnea, cough, choking, chest tightness and shortness of breath.   Cardiovascular: Negative.  Negative for chest pain.  Gastrointestinal: Negative.  Negative for abdominal pain.  Endocrine: Negative.   Musculoskeletal: Positive for back pain and joint swelling.  Allergic/Immunologic: Negative.   Neurological: Negative.  Negative for headaches.    Allergies  Diphenhydramine hcl  Home Medications   Current Outpatient Rx  Name  Route  Sig  Dispense  Refill  . ibuprofen (ADVIL,MOTRIN) 200 MG tablet   Oral   Take 800 mg by mouth every 8 (eight) hours as needed for pain.         Marland Kitchen lamoTRIgine (LAMICTAL) 100 MG tablet   Oral   Take 100 mg by mouth at bedtime.           Marland Kitchen lisinopril-hydrochlorothiazide (PRINZIDE,ZESTORETIC) 20-12.5 MG per tablet   Oral   Take 1 tablet by mouth daily.         . QUEtiapine (SEROQUEL) 100 MG tablet   Oral   Take 100 mg by mouth at bedtime.         Marland Kitchen ibuprofen (ADVIL,MOTRIN) 600 MG tablet   Oral   Take 1 tablet (600 mg total) by mouth every 6 (six) hours as needed for pain.  30 tablet   0   . traMADol (ULTRAM) 50 MG tablet   Oral   Take 1 tablet (50 mg total) by mouth every 6 (six) hours as needed for pain.   15 tablet   0    BP 143/85  Pulse 83  Temp(Src) 97.9 F (36.6 C) (Oral)  Resp 18  SpO2 100% Physical Exam  Constitutional: She is oriented to person, place, and time. She appears well-developed and well-nourished.  EtOH use; follow commands, in NAD  HENT:  Head: Normocephalic and atraumatic.  Eyes: Conjunctivae are normal. Pupils are equal, round, and reactive to light.  Neck: Normal range of motion. Neck supple.  Cardiovascular: Normal rate and regular rhythm.  Exam reveals no gallop and no friction rub.   No murmur heard. Pulmonary/Chest: Effort normal and breath sounds normal. No respiratory distress. She has no wheezes.  Abdominal: Soft. Bowel sounds are normal. She  exhibits no distension. There is no tenderness. There is no rebound and no guarding.  Musculoskeletal:       Legs: Neurological: She is alert and oriented to person, place, and time. She has normal reflexes.  Skin: Skin is warm and dry.   Pt can ambulate with minimal assistance when encouraged.   ED Course  Procedures (including critical care time) Labs Review Labs Reviewed - No data to display Imaging Review Dg Tibia/fibula Right  04/21/2013   *RADIOLOGY REPORT*  Clinical Data: Trauma, pain  RIGHT TIBIA AND FIBULA - 2 VIEW  Comparison: Prior radiograph from 10/27/2009.  Findings: There is no acute fracture or dislocation.  Limited views of the knee are unremarkable.  No soft tissue abnormality.  Osseous mineralization is within normal limits.  A metallic density is seen overlying the posterior aspect of the right calcaneus, which may be postsurgical in nature.  Irregularity is seen at the posterior aspect of the calcaneus, slightly more prominent compared to prior examination.  IMPRESSION:  1.  No acute fracture or dislocation. 2.  Metallic density overlying the right calcaneus.  Query prior surgical intervention at this site.  Correlation with history and physical exam is recommended.   Original Report Authenticated By: Rise Mu, M.D.   Dg Knee Complete 4 Views Right  04/21/2013   *RADIOLOGY REPORT*  Clinical Data: Fall  RIGHT KNEE - COMPLETE 4+ VIEW  Comparison: None.  Findings: There is no acute fracture or dislocation.  No significant joint effusion.  Joint spaces are well maintained. Osseous mineralization is normal.  No soft tissue abnormality.  IMPRESSION:  No acute fracture or dislocation.   Original Report Authenticated By: Rise Mu, M.D.    MDM   1. Alcohol intoxication   2. Knee sprain, right, initial encounter   3. Contusion of lower leg, right, initial encounter   4. Marijuana abuse    47 yo AA female with complaint of bilat LE pain.  Pt is able to  ambulate.  PE w/o evidence of effusion, ecchymosis, point TTP or other findings.   FAROM.  X-rays without acute finding. Metallic density over right calcaneus however patient does not have tenderness to palpation there is no sign of any abnormal findings in this area. Vital signs are normal. I do not suspect acute emergent etiology. I suspect her symptoms are secondary to her chronic pain, degenerative joint disease, degenerative disc disease, and neuropathy. At this time there is no need for further imaging, evaluation, or management. Patient can continue taking her Ultram and other medications. ER precautions were given and the  patient was discharged to her family members. She was encouraged to limit her drinking and marijuana use and also counseled regarding utilizing the emergency department appropriately.  The patient appears reasonably screened and/or stabilized for discharge and I doubt any other medical condition or other [facility] requiring further screening, evaluation, or treatment in the ED at this time prior to discharge.     Darlys Gales, MD 04/21/13 [phone]

## 2013-04-23 ENCOUNTER — Emergency Department (HOSPITAL_COMMUNITY)
Admission: EM | Admit: 2013-04-23 | Discharge: 2013-04-23 | Disposition: A | Discharge status: Home / Self Care | Payer: Medicaid Other | Attending: Emergency Medicine | Admitting: Emergency Medicine

## 2013-04-23 ENCOUNTER — Encounter (HOSPITAL_COMMUNITY): Payer: Self-pay | Admitting: Emergency Medicine

## 2013-04-23 DIAGNOSIS — M5430 Sciatica, unspecified side: Secondary | ICD-10-CM | POA: Insufficient documentation

## 2013-04-23 DIAGNOSIS — M7989 Other specified soft tissue disorders: Secondary | ICD-10-CM | POA: Insufficient documentation

## 2013-04-23 DIAGNOSIS — G8911 Acute pain due to trauma: Secondary | ICD-10-CM | POA: Insufficient documentation

## 2013-04-23 DIAGNOSIS — Z87828 Personal history of other (healed) physical injury and trauma: Secondary | ICD-10-CM | POA: Insufficient documentation

## 2013-04-23 DIAGNOSIS — I10 Essential (primary) hypertension: Secondary | ICD-10-CM | POA: Insufficient documentation

## 2013-04-23 DIAGNOSIS — G8929 Other chronic pain: Secondary | ICD-10-CM | POA: Insufficient documentation

## 2013-04-23 DIAGNOSIS — Z79899 Other long term (current) drug therapy: Secondary | ICD-10-CM | POA: Insufficient documentation

## 2013-04-23 DIAGNOSIS — M1990 Unspecified osteoarthritis, unspecified site: Secondary | ICD-10-CM | POA: Insufficient documentation

## 2013-04-23 DIAGNOSIS — M25561 Pain in right knee: Secondary | ICD-10-CM

## 2013-04-23 DIAGNOSIS — M25569 Pain in unspecified knee: Secondary | ICD-10-CM | POA: Insufficient documentation

## 2013-04-23 DIAGNOSIS — F17200 Nicotine dependence, unspecified, uncomplicated: Secondary | ICD-10-CM | POA: Insufficient documentation

## 2013-04-23 MED ORDER — HYDROCODONE-ACETAMINOPHEN 5-325 MG PO TABS: 1.0000 | ORAL_TABLET | Freq: Four times a day (QID) | ORAL | Status: DC | PRN

## 2013-04-23 MED ORDER — NAPROXEN 500 MG PO TABS: 500.0000 mg | ORAL_TABLET | Freq: Two times a day (BID) | ORAL | Status: DC

## 2013-04-23 NOTE — ED Notes (Signed)
Pt stated that she tripped and fell while intoxicated on Saturday night. Pt seen at [facility]. Pt stated that the pain and swelling have increased

## 2013-04-23 NOTE — ED Provider Notes (Signed)
CSN: [id number]     Arrival date & time 04/23/13  1311 History   First MD Initiated Contact with Patient 04/23/13 1403     Chief Complaint  Patient presents with  . Knee Pain    r/knee pain 3 days  . Fall    Pt fell saturday night   (Consider location/radiation/quality/duration/timing/severity/associated sxs/prior Treatment) HPI Comments: Patient presents with a chief complaint of pain and swelling of the right knee.  She reports that the pain has been present since falling three days ago.  She was seen in the ED after the fall and had a negative xray at that time.  She has been taking Tramadol and Ibuprofen for the pain, but does not feel that it helps.  She reports that the swelling and pain is gradually becoming worse.  Pain is constant.  Pain worse with ambulation.  She denies any numbness or tingling.    The history is provided by the patient.    Past Medical History  Diagnosis Date  . Hypertension   . History of hysterectomy   . Migraine   . Chronic pain   . DDD (degenerative disc disease)   . Neuropathy   . Sciatica   . Osteoarthritis    Past Surgical History  Procedure Laterality Date  . Abdominal hysterectomy     Family History  Problem Relation Age of Onset  . Hypertension Father    History  Substance Use Topics  . Smoking status: Current Every Day Smoker -- 0.10 packs/day    Types: Cigarettes  . Smokeless tobacco: Not on file  . Alcohol Use: 14.4 oz/week    24 Cans of beer per week     Comment: every day   OB History   Grav Para Term Preterm Abortions TAB SAB Ect Mult Living                 Review of Systems  Musculoskeletal:       Right knee pain    Allergies  Diphenhydramine hcl  Home Medications   Current Outpatient Rx  Name  Route  Sig  Dispense  Refill  . ibuprofen (ADVIL,MOTRIN) 200 MG tablet   Oral   Take 800 mg by mouth every 8 (eight) hours as needed for pain.         Marland Kitchen ketoprofen (ORUDIS) 50 MG capsule   Oral   Take 50 mg by  mouth 2 (two) times daily.         Marland Kitchen lisinopril-hydrochlorothiazide (PRINZIDE,ZESTORETIC) 20-12.5 MG per tablet   Oral   Take 1 tablet by mouth daily.         . traMADol (ULTRAM) 50 MG tablet   Oral   Take 1 tablet (50 mg total) by mouth every 6 (six) hours as needed for pain.   15 tablet   0    BP 136/94  Pulse 88  Temp(Src) 98.3 F (36.8 C) (Oral)  Resp 16  Wt 166 lb (75.297 kg)  SpO2 99% Physical Exam  Nursing note and vitals reviewed. Constitutional: She appears well-developed and well-nourished.  HENT:  Head: Normocephalic and atraumatic.  Cardiovascular: Normal rate, regular rhythm and normal heart sounds.   Pulses:      Dorsalis pedis pulses are 2+ on the right side, and 2+ on the left side.  Pulmonary/Chest: Effort normal and breath sounds normal.  Musculoskeletal:       Right knee: She exhibits swelling. She exhibits normal range of motion, no ecchymosis, no deformity, no  erythema, no LCL laxity and no MCL laxity. Tenderness found. Medial joint line and lateral joint line tenderness noted.  Neurological: She is alert. No sensory deficit.  Skin: Skin is warm and dry.  Psychiatric: She has a normal mood and affect.    ED Course  Procedures (including critical care time) Labs Review Labs Reviewed - No data to display Imaging Review No results found.  MDM  No diagnosis found. Patient presenting with right knee pain that has been present since falling three days ago.  She had a xray done at the time of the injury, which was negative.  Patient is neurovascularly intact.  Patient given knee sleeve and crutches.  Patient stable for discharge.    Pascal Lux Henderson, PA-C 04/24/13 1652

## 2013-04-24 NOTE — ED Provider Notes (Signed)
Medical screening examination/treatment/procedure(s) were performed by non-physician practitioner and as supervising physician I was immediately available for consultation/collaboration.    Vida Roller, MD 04/24/13 [phone]

## 2013-04-27 ENCOUNTER — Emergency Department (HOSPITAL_COMMUNITY)
Admission: EM | Admit: 2013-04-27 | Discharge: 2013-04-27 | Disposition: A | Discharge status: Home / Self Care | Payer: Medicaid Other | Attending: Emergency Medicine | Admitting: Emergency Medicine

## 2013-04-27 ENCOUNTER — Emergency Department (HOSPITAL_COMMUNITY): Payer: Medicaid Other

## 2013-04-27 ENCOUNTER — Encounter (HOSPITAL_COMMUNITY): Payer: Self-pay | Admitting: Emergency Medicine

## 2013-04-27 DIAGNOSIS — G8929 Other chronic pain: Secondary | ICD-10-CM | POA: Insufficient documentation

## 2013-04-27 DIAGNOSIS — Z8669 Personal history of other diseases of the nervous system and sense organs: Secondary | ICD-10-CM | POA: Insufficient documentation

## 2013-04-27 DIAGNOSIS — F17200 Nicotine dependence, unspecified, uncomplicated: Secondary | ICD-10-CM | POA: Insufficient documentation

## 2013-04-27 DIAGNOSIS — S8990XA Unspecified injury of unspecified lower leg, initial encounter: Secondary | ICD-10-CM | POA: Insufficient documentation

## 2013-04-27 DIAGNOSIS — R296 Repeated falls: Secondary | ICD-10-CM | POA: Insufficient documentation

## 2013-04-27 DIAGNOSIS — Z79899 Other long term (current) drug therapy: Secondary | ICD-10-CM | POA: Insufficient documentation

## 2013-04-27 DIAGNOSIS — Z791 Long term (current) use of non-steroidal anti-inflammatories (NSAID): Secondary | ICD-10-CM | POA: Insufficient documentation

## 2013-04-27 DIAGNOSIS — S8991XA Unspecified injury of right lower leg, initial encounter: Secondary | ICD-10-CM

## 2013-04-27 DIAGNOSIS — M1990 Unspecified osteoarthritis, unspecified site: Secondary | ICD-10-CM | POA: Insufficient documentation

## 2013-04-27 DIAGNOSIS — Y9389 Activity, other specified: Secondary | ICD-10-CM | POA: Insufficient documentation

## 2013-04-27 DIAGNOSIS — Y929 Unspecified place or not applicable: Secondary | ICD-10-CM | POA: Insufficient documentation

## 2013-04-27 DIAGNOSIS — I10 Essential (primary) hypertension: Secondary | ICD-10-CM | POA: Insufficient documentation

## 2013-04-27 MED ORDER — OXYCODONE-ACETAMINOPHEN 5-325 MG PO TABS: 1.0000 | ORAL_TABLET | Freq: Four times a day (QID) | ORAL | Status: DC | PRN

## 2013-04-27 MED ORDER — OXYCODONE-ACETAMINOPHEN 5-325 MG PO TABS
1.0000 | ORAL_TABLET | Freq: Once | ORAL | Status: AC
  Administered 2013-04-27: 1 via ORAL
  Filled 2013-04-27: qty 1

## 2013-04-27 NOTE — ED Provider Notes (Signed)
Medical screening examination/treatment/procedure(s) were performed by non-physician practitioner and as supervising physician I was immediately available for consultation/collaboration.   Gilda Crease, MD 04/27/13 [phone]

## 2013-04-27 NOTE — ED Provider Notes (Signed)
CSN: [id number]     Arrival date & time 04/27/13  1632 History  This chart was scribed for non-physician practitioner Marlon Pel working with Gilda Crease, * by Carl Best, ED Scribe. This patient was seen in room WTR6/WTR6 and the patient's care was started at 6:00 PM.    Chief Complaint  Patient presents with  . Knee Pain    Patient is a 47 y.o. female presenting with knee pain. The history is provided by the patient. No language interpreter was used.  Knee Pain  HPI Comments: JONIYA BOBERG is a 47 y.o. female who presents to the Emergency Department complaining of constant right knee pain that started a week ago when she fell on her knee while under the influence of alcohol.  She states that she has been trying to walk in an effort to keep the area mobile but can hear a "popping" sound when she walks.  She states that there is instability when she walks and she is unable to bend her right knee.  Patient states that she has been taking medication for the pain but with no relief.   Past Medical History  Diagnosis Date  . Hypertension   . History of hysterectomy   . Migraine   . Chronic pain   . DDD (degenerative disc disease)   . Neuropathy   . Sciatica   . Osteoarthritis    Past Surgical History  Procedure Laterality Date  . Abdominal hysterectomy     Family History  Problem Relation Age of Onset  . Hypertension Father    History  Substance Use Topics  . Smoking status: Current Every Day Smoker -- 0.10 packs/day    Types: Cigarettes  . Smokeless tobacco: Not on file  . Alcohol Use: 14.4 oz/week    24 Cans of beer per week     Comment: every day   OB History   Grav Para Term Preterm Abortions TAB SAB Ect Mult Living                 Review of Systems  Musculoskeletal: Positive for joint swelling (swelling over the right knee) and gait problem (cannot put full weight on the knee).  All other systems reviewed and are negative.    Allergies   Diphenhydramine hcl  Home Medications   Current Outpatient Rx  Name  Route  Sig  Dispense  Refill  . HYDROcodone-acetaminophen (NORCO/VICODIN) 5-325 MG per tablet   Oral   Take 1-2 tablets by mouth every 6 (six) hours as needed for pain.   10 tablet   0   . ibuprofen (ADVIL,MOTRIN) 200 MG tablet   Oral   Take 800 mg by mouth every 8 (eight) hours as needed for pain.         Marland Kitchen ketoprofen (ORUDIS) 50 MG capsule   Oral   Take 50 mg by mouth 2 (two) times daily.         Marland Kitchen lisinopril-hydrochlorothiazide (PRINZIDE,ZESTORETIC) 20-12.5 MG per tablet   Oral   Take 1 tablet by mouth daily.         . naproxen (NAPROSYN) 500 MG tablet   Oral   Take 1 tablet (500 mg total) by mouth 2 (two) times daily.   30 tablet   0   . traMADol (ULTRAM) 50 MG tablet   Oral   Take 1 tablet (50 mg total) by mouth every 6 (six) hours as needed for pain.   15 tablet   0   .  oxyCODONE-acetaminophen (PERCOCET/ROXICET) 5-325 MG per tablet   Oral   Take 1 tablet by mouth every 6 (six) hours as needed for pain.   15 tablet   0    Triage Vitals: BP 124/78  Pulse 80  Temp(Src) 98.9 F (37.2 C) (Oral)  Resp 20  SpO2 100%  Physical Exam  Nursing note and vitals reviewed. Constitutional: She is oriented to person, place, and time. She appears well-developed and well-nourished. No distress.  HENT:  Head: Normocephalic and atraumatic.  Eyes: EOM are normal.  Neck: Neck supple. No tracheal deviation present.  Cardiovascular: Normal rate.   Pulmonary/Chest: Effort normal. No respiratory distress.  Musculoskeletal:       Right knee: She exhibits decreased range of motion, swelling, effusion and erythema. She exhibits no ecchymosis, no deformity, no laceration, normal alignment, no LCL laxity and normal patellar mobility. Tenderness found. Lateral joint line tenderness noted.  Neurological: She is alert and oriented to person, place, and time.  Skin: Skin is warm and dry.  Psychiatric: She  has a normal mood and affect. Her behavior is normal.    ED Course  Procedures (including critical care time)  DIAGNOSTIC STUDIES: Oxygen Saturation is 100% on room air, normal by my interpretation.    COORDINATION OF CARE: 6:05 PM- Discussed x-ray results with the patient that was normal.  Discussed clinical suspicion of a torn ligament and advised the patient to get an MRI.  Will discharge the patient with a referral to an orthopedic doctor, a prescription for Vicodin, a knee immobilizer, and crutches.  Advised the patient to keep taking Naprosyn for the inflammation.    Medications  oxyCODONE-acetaminophen (PERCOCET/ROXICET) 5-325 MG per tablet 1 tablet (1 tablet Oral Given 04/27/13 1821)    Labs Review Labs Reviewed - No data to display Imaging Review Dg Knee Complete 4 Views Right  04/27/2013   CLINICAL DATA:  Pain post trauma  EXAM: RIGHT KNEE - COMPLETE 4+ VIEW  COMPARISON:  April 21, 2013  FINDINGS: Frontal, lateral, and bilateral oblique views were obtained. There is a moderate joint effusion. No fracture or dislocation. Joint spaces appear intact. No erosive change.  IMPRESSION: Moderate joint effusion. No fracture or appreciable arthropathy.   Electronically Signed   By: Bretta Bang   On: 04/27/2013 18:01    MDM   1. Knee injury, right, initial encounter    Concern for patients ACL, she describes instability when she walks. She is currently using a knee sleeve. Will put patient in knee immobilizer and crutches. She will need MRI to be ordered by an orthopedist.   47 y.o.Lorenna Lurry Herald's evaluation in the Emergency Department is complete. It has been determined that no acute conditions requiring further emergency intervention are present at this time. The patient/guardian have been advised of the diagnosis and plan. We have discussed signs and symptoms that warrant return to the ED, such as changes or worsening in symptoms.  Vital signs are stable at  discharge. Filed Vitals:   04/27/13 1644  BP: 124/78  Pulse: 80  Temp: 98.9 F (37.2 C)  Resp: 20    Patient/guardian has voiced understanding and agreed to follow-up with the PCP or specialist.  I personally performed the services described in this documentation, which was scribed in my presence. The recorded information has been reviewed and is accurate.   Dorthula Matas, PA-C 04/27/13 1829

## 2013-04-27 NOTE — ED Notes (Signed)
Pt c/o of right knee pain 10/10. States that she fell about a week ago. Given crutches and knee immobilizer. No relief. "feels like its about to break".

## 2013-07-25 ENCOUNTER — Other Ambulatory Visit (HOSPITAL_COMMUNITY): Payer: Self-pay | Admitting: *Deleted

## 2013-07-25 NOTE — Pre-Procedure Instructions (Addendum)
[name] [name]  07/25/2013   Your procedure is scheduled on:  Wednesday, August 07, 2013 at 8:30 AM.   Report to [facility] Entrance "A" Admitting Office at 6:30 AM.   Call this number if you have problems the morning of surgery: [phone]   Remember:   Do not eat food or drink liquids after midnight Tuesday, 08/06/13.   Take these medicines the morning of surgery with A SIP OF WATER: amLODipine (NORVASC), gabapentin (NEURONTIN),  You may take one of your pain pills (Tramadol, Oxycodone or Hydrocodone) if needed, tiZANidine (ZANAFLEX) - if needed.  Stop Naproxen and Ketoprofen as of saturday, 08/02/13.          STOP all herbel meds, nsaids (aleve,naproxen,advil,ibuprofen) 5 days prior to surgery   Do not wear jewelry, make-up or nail polish.  Do not wear lotions, powders, or perfumes. You may wear deodorant.  Do not shave 48 hours prior to surgery.   Do not bring valuables to the hospital.  [facility] is not responsible                  for any belongings or valuables.               Contacts, dentures or bridgework may not be worn into surgery.  Leave suitcase in the car. After surgery it may be brought to your room.  For patients admitted to the hospital, discharge time is determined by your                treatment team.                 Special Instructions: Shower using CHG 2 nights before surgery and the night before surgery.  If you shower the day of surgery use CHG.  Use special wash - you have one bottle of CHG for all showers.  You should use approximately 1/3 of the bottle for each shower.   Please read over the following fact sheets that you were given: Pain Booklet, Coughing and Deep Breathing, Blood Transfusion Information, MRSA Information and Surgical Site Infection Prevention

## 2013-07-26 ENCOUNTER — Encounter (HOSPITAL_COMMUNITY)
Admission: RE | Admit: 2013-07-26 | Discharge: 2013-07-26 | Disposition: A | Discharge status: Home / Self Care | Payer: Medicaid Other | Source: Ambulatory Visit | Attending: Orthopedic Surgery | Admitting: Orthopedic Surgery

## 2013-07-26 ENCOUNTER — Encounter (HOSPITAL_COMMUNITY): Payer: Self-pay

## 2013-07-26 DIAGNOSIS — Z01812 Encounter for preprocedural laboratory examination: Secondary | ICD-10-CM | POA: Insufficient documentation

## 2013-07-26 DIAGNOSIS — Z01818 Encounter for other preprocedural examination: Secondary | ICD-10-CM | POA: Insufficient documentation

## 2013-07-26 DIAGNOSIS — Z01810 Encounter for preprocedural cardiovascular examination: Secondary | ICD-10-CM | POA: Insufficient documentation

## 2013-07-26 HISTORY — DX: Depression, unspecified: F32.A

## 2013-07-26 HISTORY — DX: Unspecified asthma, uncomplicated: J45.909

## 2013-07-26 HISTORY — DX: Gastro-esophageal reflux disease without esophagitis: K21.9

## 2013-07-26 HISTORY — DX: Major depressive disorder, single episode, unspecified: F32.9

## 2013-07-26 LAB — BASIC METABOLIC PANEL
BUN: 14 mg/dL (ref 6–23)
CO2: 27 mEq/L (ref 19–32)
Calcium: 9.1 mg/dL (ref 8.4–10.5)
Chloride: 104 mEq/L (ref 96–112)
Creatinine, Ser: 0.9 mg/dL (ref 0.50–1.10)
GFR calc Af Amer: 86 mL/min — ABNORMAL LOW (ref >90)
GFR calc non Af Amer: 74 mL/min — ABNORMAL LOW (ref >90)
Glucose, Bld: 60 mg/dL — ABNORMAL LOW (ref 70–99)
Potassium: 3.7 mEq/L (ref 3.5–5.1)
Sodium: 137 mEq/L (ref 135–145)

## 2013-07-26 LAB — CBC
HCT: 34.8 % — ABNORMAL LOW (ref 36.0–46.0)
Hemoglobin: 11.8 g/dL — ABNORMAL LOW (ref 12.0–15.0)
MCH: 31.1 pg (ref 26.0–34.0)
MCHC: 33.9 g/dL (ref 30.0–36.0)
MCV: 91.6 fL (ref 78.0–100.0)
Platelets: 328 10*3/uL (ref 150–400)
RBC: 3.8 MIL/uL — ABNORMAL LOW (ref 3.87–5.11)
RDW: 13.3 % (ref 11.5–15.5)
WBC: 6.1 10*3/uL (ref 4.0–10.5)

## 2013-07-26 LAB — ABO/RH: ABO/RH(D): O POS

## 2013-07-26 LAB — TYPE AND SCREEN
ABO/RH(D): O POS
Antibody Screen: NEGATIVE

## 2013-07-26 LAB — SURGICAL PCR SCREEN
MRSA, PCR: NEGATIVE
Staphylococcus aureus: NEGATIVE

## 2013-08-07 ENCOUNTER — Ambulatory Visit (HOSPITAL_COMMUNITY): Admission: RE | Admit: 2013-08-07 | Payer: Medicaid Other | Source: Ambulatory Visit | Admitting: Orthopedic Surgery

## 2013-08-07 ENCOUNTER — Encounter (HOSPITAL_COMMUNITY): Admission: RE | Payer: Self-pay | Source: Ambulatory Visit

## 2013-08-07 SURGERY — POSTERIOR LUMBAR FUSION 1 LEVEL
Anesthesia: General

## 2013-10-14 ENCOUNTER — Emergency Department (HOSPITAL_COMMUNITY): Payer: Medicaid Other

## 2013-10-14 ENCOUNTER — Encounter (HOSPITAL_COMMUNITY): Payer: Self-pay | Admitting: Emergency Medicine

## 2013-10-14 ENCOUNTER — Emergency Department (HOSPITAL_COMMUNITY)
Admission: EM | Admit: 2013-10-14 | Discharge: 2013-10-14 | Disposition: A | Discharge status: Home / Self Care | Payer: Medicaid Other | Attending: Emergency Medicine | Admitting: Emergency Medicine

## 2013-10-14 DIAGNOSIS — J45909 Unspecified asthma, uncomplicated: Secondary | ICD-10-CM | POA: Insufficient documentation

## 2013-10-14 DIAGNOSIS — R0602 Shortness of breath: Secondary | ICD-10-CM | POA: Insufficient documentation

## 2013-10-14 DIAGNOSIS — F3289 Other specified depressive episodes: Secondary | ICD-10-CM | POA: Insufficient documentation

## 2013-10-14 DIAGNOSIS — F329 Major depressive disorder, single episode, unspecified: Secondary | ICD-10-CM | POA: Insufficient documentation

## 2013-10-14 DIAGNOSIS — G589 Mononeuropathy, unspecified: Secondary | ICD-10-CM | POA: Insufficient documentation

## 2013-10-14 DIAGNOSIS — F17200 Nicotine dependence, unspecified, uncomplicated: Secondary | ICD-10-CM | POA: Insufficient documentation

## 2013-10-14 DIAGNOSIS — IMO0001 Reserved for inherently not codable concepts without codable children: Secondary | ICD-10-CM | POA: Insufficient documentation

## 2013-10-14 DIAGNOSIS — I10 Essential (primary) hypertension: Secondary | ICD-10-CM | POA: Insufficient documentation

## 2013-10-14 DIAGNOSIS — J029 Acute pharyngitis, unspecified: Secondary | ICD-10-CM | POA: Insufficient documentation

## 2013-10-14 DIAGNOSIS — R0982 Postnasal drip: Secondary | ICD-10-CM | POA: Insufficient documentation

## 2013-10-14 DIAGNOSIS — Z79899 Other long term (current) drug therapy: Secondary | ICD-10-CM | POA: Insufficient documentation

## 2013-10-14 DIAGNOSIS — K219 Gastro-esophageal reflux disease without esophagitis: Secondary | ICD-10-CM | POA: Insufficient documentation

## 2013-10-14 DIAGNOSIS — B9789 Other viral agents as the cause of diseases classified elsewhere: Secondary | ICD-10-CM | POA: Insufficient documentation

## 2013-10-14 DIAGNOSIS — R071 Chest pain on breathing: Secondary | ICD-10-CM | POA: Insufficient documentation

## 2013-10-14 DIAGNOSIS — J111 Influenza due to unidentified influenza virus with other respiratory manifestations: Secondary | ICD-10-CM

## 2013-10-14 DIAGNOSIS — IMO0002 Reserved for concepts with insufficient information to code with codable children: Secondary | ICD-10-CM | POA: Insufficient documentation

## 2013-10-14 DIAGNOSIS — M1990 Unspecified osteoarthritis, unspecified site: Secondary | ICD-10-CM | POA: Insufficient documentation

## 2013-10-14 DIAGNOSIS — R0989 Other specified symptoms and signs involving the circulatory and respiratory systems: Secondary | ICD-10-CM | POA: Insufficient documentation

## 2013-10-14 MED ORDER — HYDROCOD POLST-CHLORPHEN POLST 10-8 MG/5ML PO LQCR: 5.0000 mL | Freq: Two times a day (BID) | ORAL | Status: DC | PRN

## 2013-10-14 MED ORDER — ALBUTEROL SULFATE HFA 108 (90 BASE) MCG/ACT IN AERS
2.0000 | INHALATION_SPRAY | Freq: Once | RESPIRATORY_TRACT | Status: AC
  Administered 2013-10-14: 2 via RESPIRATORY_TRACT
  Filled 2013-10-14: qty 6.7

## 2013-10-14 NOTE — Discharge Instructions (Signed)
Influenza, Adult °Influenza (flu) is an infection in the mouth, nose, and throat (respiratory tract) caused by a virus. The flu can make you feel very ill. Influenza spreads easily from person to person (contagious).  °HOME CARE  °· Only take medicines as told by your doctor. °· Use a cool mist humidifier to make breathing easier. °· Get plenty of rest until your fever goes away. This usually takes 3 to 4 days. °· Drink enough fluids to keep your pee (urine) clear or pale yellow. °· Cover your mouth and nose when you cough or sneeze. °· Wash your hands well to avoid spreading the flu. °· Stay home from work or school until your fever has been gone for at least 1 full day. °· Get a flu shot every year. °GET HELP RIGHT AWAY IF:  °· You have trouble breathing or feel short of breath. °· Your skin or nails turn blue. °· You have severe neck pain or stiffness. °· You have a severe headache, facial pain, or earache. °· Your fever gets worse or keeps coming back. °· You feel sick to your stomach (nauseous), throw up (vomit), or have watery poop (diarrhea). °· You have chest pain. °· You have a deep cough that gets worse, or you cough up more thick spit (mucus). °MAKE SURE YOU:  °· Understand these instructions. °· Will watch your condition. °· Will get help right away if you are not doing well or get worse. °Document Released: 05/03/2008 Document Revised: 01/24/2012 Document Reviewed: 10/24/2011 °[employer]® Patient Information ©2014 [employer]. ° °

## 2013-10-14 NOTE — ED Provider Notes (Signed)
CSN: [id number]     Arrival date & time 10/14/13  0865 History   First MD Initiated Contact with Patient 10/14/13 1021     Chief Complaint  Patient presents with  . Cough    Onset 4-5 days ago     (Consider location/radiation/quality/duration/timing/severity/associated sxs/prior Treatment) HPI Comments: Patient is a 48 year old female with history of hypertension, migraines, chronic pain, DDD, nueropathy, asthma, GERD who presents today with 5 days of gradually worsening myalgias and cough. She reports her cough is worse at night and is productive. She has associated congestion. She took one dose of "dollar store cough syrup" with no relief. She has associated chest pain only when she coughs. It is a stabbing pain. Pain is also worse with palpation. She has not taken any tylenol or Advil today. She denies any sick contacts. No fevers, chills, nausea, vomiting, abdominal pain.   The history is provided by the patient. No language interpreter was used.    Past Medical History  Diagnosis Date  . Hypertension   . History of hysterectomy   . Migraine   . Chronic pain   . DDD (degenerative disc disease)   . Neuropathy   . Sciatica   . Osteoarthritis   . Depression     bipolar  . Asthma   . Pneumonia     hx  . GERD (gastroesophageal reflux disease)    Past Surgical History  Procedure Laterality Date  . Bunionectomy Left   . Heel spur surgery Right   . Abdominal hysterectomy      07   Family History  Problem Relation Age of Onset  . Hypertension Father    History  Substance Use Topics  . Smoking status: Current Every Day Smoker -- 0.25 packs/day for 30 years    Types: Cigarettes  . Smokeless tobacco: Not on file     Comment: 1 month last marijuna  occ alcohol now  . Alcohol Use: 0.0 oz/week     Comment: every day   OB History   Grav Para Term Preterm Abortions TAB SAB Ect Mult Living                 Review of Systems  Constitutional: Negative for fever, chills and  diaphoresis.  HENT: Positive for congestion, postnasal drip, sinus pressure and sore throat.   Respiratory: Positive for cough and shortness of breath.   Cardiovascular: Positive for chest pain.  Gastrointestinal: Negative for nausea, vomiting and abdominal pain.  All other systems reviewed and are negative.      Allergies  Diphenhydramine hcl  Home Medications   Current Outpatient Rx  Name  Route  Sig  Dispense  Refill  . albuterol (PROVENTIL HFA;VENTOLIN HFA) 108 (90 BASE) MCG/ACT inhaler   Inhalation   Inhale 2 puffs into the lungs every 6 (six) hours as needed for wheezing or shortness of breath.         Marland Kitchen amLODipine (NORVASC) 2.5 MG tablet   Oral   Take 2.5 mg by mouth daily.         Marland Kitchen BIOTIN PO   Oral   Take 1 tablet by mouth daily.         Marland Kitchen gabapentin (NEURONTIN) 300 MG capsule   Oral   Take 300 mg by mouth 2 (two) times daily.         Marland Kitchen ketoprofen (ORUDIS) 75 MG capsule   Oral   Take 75 mg by mouth 3 (three) times daily.         Marland Kitchen  lisinopril-hydrochlorothiazide (PRINZIDE,ZESTORETIC) 20-12.5 MG per tablet   Oral   Take 1 tablet by mouth daily.         . pravastatin (PRAVACHOL) 40 MG tablet   Oral   Take 40 mg by mouth daily.         . QUEtiapine (SEROQUEL) 100 MG tablet   Oral   Take 100 mg by mouth at bedtime.         . SUMAtriptan (IMITREX) 50 MG tablet   Oral   Take 50 mg by mouth every 2 (two) hours as needed for migraine or headache. May repeat in 2 hours if headache persists or recurs.         Marland Kitchen tiZANidine (ZANAFLEX) 4 MG tablet   Oral   Take 4 mg by mouth every 8 (eight) hours as needed for muscle spasms.         . chlorpheniramine-HYDROcodone (TUSSIONEX PENNKINETIC ER) 10-8 MG/5ML LQCR   Oral   Take 5 mLs by mouth every 12 (twelve) hours as needed for cough (Cough).   115 mL   0    BP 134/95  Pulse 85  Temp(Src) 98.3 F (36.8 C) (Oral)  Resp 16  Wt 189 lb (85.73 kg)  SpO2 98% Physical Exam  Nursing note and  vitals reviewed. Constitutional: She is oriented to person, place, and time. She appears well-developed and well-nourished.  Non-toxic appearance. She does not have a sickly appearance. She does not appear ill. No distress.  Very well appearing  HENT:  Head: Normocephalic and atraumatic.  Right Ear: Tympanic membrane, external ear and ear canal normal.  Left Ear: Tympanic membrane, external ear and ear canal normal.  Nose: Nose normal. Right sinus exhibits no maxillary sinus tenderness and no frontal sinus tenderness. Left sinus exhibits no maxillary sinus tenderness and no frontal sinus tenderness.  Mouth/Throat: Uvula is midline, oropharynx is clear and moist and mucous membranes are normal.  Eyes: Conjunctivae and EOM are normal. Pupils are equal, round, and reactive to light.  Neck: Normal range of motion.  No nuchal rigidity or meningeal signs  Cardiovascular: Normal rate, regular rhythm, normal heart sounds, intact distal pulses and normal pulses.   Pulmonary/Chest: Effort normal. No stridor. No respiratory distress. She has wheezes (mild expiratory). She has no rales. She exhibits tenderness.    Abdominal: Soft. She exhibits no distension. There is no tenderness.  Musculoskeletal: Normal range of motion.  Neurological: She is alert and oriented to person, place, and time. She has normal strength.  Skin: Skin is warm and dry. She is not diaphoretic. No erythema.  Psychiatric: She has a normal mood and affect. Her behavior is normal.    ED Course  Procedures (including critical care time) Labs Review Labs Reviewed - No data to display Imaging Review Dg Chest 2 View  10/14/2013   CLINICAL DATA:  Shortness of breath  EXAM: CHEST  2 VIEW  COMPARISON:  July 26, 2013  FINDINGS: Lungs are clear. Heart size and pulmonary vascularity are normal. No adenopathy. No bone lesions.  IMPRESSION: No abnormality noted.   Electronically Signed   By: Bretta Bang M.D.   On: 10/14/2013 10:46       EKG Interpretation None      MDM   Final diagnoses:  Influenza    Patient presents with influenza like symptoms. She is out of the window for tamiflu. CXR shows no PNA. Will given tussionex for the cough. Encouraged patient to drink fluids and take tylenol and Advil  for the body aches. Reason to return to the ED were discussed. Vital signs stable for discharge. Patient / Family / Caregiver informed of clinical course, understand medical decision-making process, and agree with plan.     Mora BellmanHannah S Swayzee Wadley, PA-C 10/14/13 1551

## 2013-10-14 NOTE — ED Notes (Signed)
Pt alert, arrives from home, c/o cough, congestion, onset was last leek, denies fever, resp even unlabored, skin pwd

## 2013-10-14 NOTE — ED Provider Notes (Signed)
Medical screening examination/treatment/procedure(s) were performed by non-physician practitioner and as supervising physician I was immediately available for consultation/collaboration.  Luceil Herrin, MD 10/14/13 1600 

## 2013-12-02 ENCOUNTER — Ambulatory Visit: Payer: Medicaid Other | Admitting: Physical Therapy

## 2013-12-09 ENCOUNTER — Ambulatory Visit: Payer: Medicaid Other | Attending: Specialist | Admitting: Physical Therapy

## 2013-12-09 DIAGNOSIS — R609 Edema, unspecified: Secondary | ICD-10-CM | POA: Insufficient documentation

## 2013-12-09 DIAGNOSIS — IMO0001 Reserved for inherently not codable concepts without codable children: Secondary | ICD-10-CM | POA: Insufficient documentation

## 2013-12-09 DIAGNOSIS — M25569 Pain in unspecified knee: Secondary | ICD-10-CM | POA: Insufficient documentation

## 2013-12-17 ENCOUNTER — Ambulatory Visit: Payer: Medicaid Other | Admitting: Physical Therapy

## 2013-12-19 ENCOUNTER — Ambulatory Visit: Payer: Medicaid Other | Admitting: Physical Therapy

## 2013-12-31 ENCOUNTER — Ambulatory Visit: Payer: Medicaid Other | Admitting: Physical Therapy

## 2014-01-06 ENCOUNTER — Emergency Department (HOSPITAL_COMMUNITY)
Admission: EM | Admit: 2014-01-06 | Discharge: 2014-01-06 | Disposition: A | Discharge status: Home / Self Care | Payer: Medicaid Other | Attending: Emergency Medicine | Admitting: Emergency Medicine

## 2014-01-06 DIAGNOSIS — F17200 Nicotine dependence, unspecified, uncomplicated: Secondary | ICD-10-CM | POA: Insufficient documentation

## 2014-01-06 DIAGNOSIS — G8929 Other chronic pain: Secondary | ICD-10-CM | POA: Insufficient documentation

## 2014-01-06 DIAGNOSIS — R52 Pain, unspecified: Secondary | ICD-10-CM | POA: Insufficient documentation

## 2014-01-06 DIAGNOSIS — Z79899 Other long term (current) drug therapy: Secondary | ICD-10-CM | POA: Insufficient documentation

## 2014-01-06 DIAGNOSIS — M549 Dorsalgia, unspecified: Secondary | ICD-10-CM | POA: Insufficient documentation

## 2014-01-06 DIAGNOSIS — G43909 Migraine, unspecified, not intractable, without status migrainosus: Secondary | ICD-10-CM | POA: Insufficient documentation

## 2014-01-06 DIAGNOSIS — Z8701 Personal history of pneumonia (recurrent): Secondary | ICD-10-CM | POA: Insufficient documentation

## 2014-01-06 DIAGNOSIS — J45909 Unspecified asthma, uncomplicated: Secondary | ICD-10-CM | POA: Insufficient documentation

## 2014-01-06 DIAGNOSIS — I10 Essential (primary) hypertension: Secondary | ICD-10-CM | POA: Insufficient documentation

## 2014-01-06 DIAGNOSIS — Z8739 Personal history of other diseases of the musculoskeletal system and connective tissue: Secondary | ICD-10-CM | POA: Insufficient documentation

## 2014-01-06 DIAGNOSIS — Z8719 Personal history of other diseases of the digestive system: Secondary | ICD-10-CM | POA: Insufficient documentation

## 2014-01-06 DIAGNOSIS — F319 Bipolar disorder, unspecified: Secondary | ICD-10-CM | POA: Insufficient documentation

## 2014-01-06 MED ORDER — HYDROCODONE-ACETAMINOPHEN 5-325 MG PO TABS: 1.0000 | ORAL_TABLET | Freq: Four times a day (QID) | ORAL | Status: DC | PRN

## 2014-01-06 MED ORDER — DIAZEPAM 5 MG PO TABS: 5.0000 mg | ORAL_TABLET | Freq: Three times a day (TID) | ORAL | Status: DC | PRN

## 2014-01-06 NOTE — ED Provider Notes (Signed)
Medical screening examination/treatment/procedure(s) were performed by non-physician practitioner and as supervising physician I was immediately available for consultation/collaboration.   EKG Interpretation None       Ethelda Chick, MD 01/06/14 1040

## 2014-01-06 NOTE — ED Provider Notes (Signed)
CSN: [id number]     Arrival date & time 01/06/14  0240 History   First MD Initiated Contact with Patient 01/06/14 1005     Chief Complaint  Patient presents with  . Back Pain     (Consider location/radiation/quality/duration/timing/severity/associated sxs/prior Treatment) HPI [name] [name] is a 48 y.o. female who presents to ED complaining of back pain. Pt states she has had back pain "for years" but states it worsened in the last 3 days.  was told she has arthritis and needs to have a surgery. Pt states she is waiting for her insurance to approve surgery. States meanwhile, her PCP is trying to get her into pain management. Pt states pain is always in the lower back. It does not radiate. No numbness or weakness in extremities. No loss of bladder or bowel control. No recent injuries. No fever. No urinary symptoms. Pain is worsened with movement. States also had a right knee surgery 1 month ago for arthritis, but states it is not bothering her as much as her back. Pt is currently taking ketoprofen, gabapentin, etodolac. States no relief. Pt has an apt with her PCP today but states she is unable to pay her copay, which is $3.   Past Medical History  Diagnosis Date  . Hypertension   . History of hysterectomy   . Migraine   . Chronic pain   . DDD (degenerative disc disease)   . Neuropathy   . Sciatica   . Osteoarthritis   . Depression     bipolar  . Asthma   . Pneumonia     hx  . GERD (gastroesophageal reflux disease)    Past Surgical History  Procedure Laterality Date  . Bunionectomy Left   . Heel spur surgery Right   . Abdominal hysterectomy      07   Family History  Problem Relation Age of Onset  . Hypertension Father    History  Substance Use Topics  . Smoking status: Current Every Day Smoker -- 0.25 packs/day for 30 years    Types: Cigarettes  . Smokeless tobacco: Not on file     Comment: 1 month last marijuna  occ alcohol now  . Alcohol Use: 0.0 oz/week   Comment: every day   OB History   Grav Para Term Preterm Abortions TAB SAB Ect Mult Living                 Review of Systems  Constitutional: Negative for fever and chills.  Respiratory: Negative for cough, chest tightness and shortness of breath.   Cardiovascular: Negative for chest pain, palpitations and leg swelling.  Gastrointestinal: Negative for nausea, vomiting, abdominal pain and diarrhea.  Genitourinary: Negative for dysuria, flank pain, vaginal bleeding, vaginal discharge, vaginal pain and pelvic pain.  Musculoskeletal: Positive for back pain. Negative for arthralgias, myalgias, neck pain and neck stiffness.  Skin: Negative for rash.  Neurological: Negative for dizziness, weakness and headaches.  All other systems reviewed and are negative.     Allergies  Diphenhydramine hcl  Home Medications   Prior to Admission medications   Medication Sig Start Date End Date Taking? Authorizing Provider  albuterol (PROVENTIL HFA;VENTOLIN HFA) 108 (90 BASE) MCG/ACT inhaler Inhale 2 puffs into the lungs every 6 (six) hours as needed for wheezing or shortness of breath.    Historical Provider, MD  amLODipine (NORVASC) 2.5 MG tablet Take 2.5 mg by mouth daily.    Historical Provider, MD  BIOTIN PO Take 1 tablet by mouth daily.  Historical Provider, MD  chlorpheniramine-HYDROcodone (TUSSIONEX PENNKINETIC ER) 10-8 MG/5ML LQCR Take 5 mLs by mouth every 12 (twelve) hours as needed for cough (Cough). 10/14/13   Mora BellmanHannah S Merrell, PA-C  gabapentin (NEURONTIN) 300 MG capsule Take 300 mg by mouth 2 (two) times daily.    Historical Provider, MD  ketoprofen (ORUDIS) 75 MG capsule Take 75 mg by mouth 3 (three) times daily.    Historical Provider, MD  lisinopril-hydrochlorothiazide (PRINZIDE,ZESTORETIC) 20-12.5 MG per tablet Take 1 tablet by mouth daily.    Historical Provider, MD  pravastatin (PRAVACHOL) 40 MG tablet Take 40 mg by mouth daily.    Historical Provider, MD  QUEtiapine (SEROQUEL) 100 MG  tablet Take 100 mg by mouth at bedtime.    Historical Provider, MD  SUMAtriptan (IMITREX) 50 MG tablet Take 50 mg by mouth every 2 (two) hours as needed for migraine or headache. May repeat in 2 hours if headache persists or recurs.    Historical Provider, MD  tiZANidine (ZANAFLEX) 4 MG tablet Take 4 mg by mouth every 8 (eight) hours as needed for muscle spasms.    Historical Provider, MD   BP 130/80  Pulse 72  Temp(Src) 98.3 F (36.8 C) (Oral)  Resp 16  SpO2 100% Physical Exam  Nursing note and vitals reviewed. Constitutional: She appears well-developed and well-nourished. No distress.  HENT:  Head: Normocephalic.  Eyes: Conjunctivae are normal.  Neck: Neck supple.  Cardiovascular: Normal rate, regular rhythm and normal heart sounds.   Pulmonary/Chest: Effort normal and breath sounds normal. No respiratory distress. She has no wheezes. She has no rales.  Musculoskeletal: She exhibits no edema.  No midline lumbar spine tenderness. Tender over bilateral paralumbar muscles. Pain with forward flexion about extension of the trunk. No pain with bilateral straight leg raise. Dorsal pedal pulses intact bilaterally.  Neurological: She is alert.  5/5 and equal lower extremity strength. 2+ and equal patellar reflexes bilaterally. Pt able to dorsiflex bilateral toes and feet with good strength against resistance. Equal sensation bilaterally over thighs and lower legs.   Skin: Skin is warm and dry.  Psychiatric: She has a normal mood and affect. Her behavior is normal.    ED Course  Procedures (including critical care time) Labs Review Labs Reviewed - No data to display  Imaging Review No results found.   EKG Interpretation None      MDM   Final diagnoses:  Back pain    Patient here with acute exacerbation of chronic back pain. She has no exam findings or symptoms that would suggest cauda equina. She's afebrile. She does not have abdominal pain or urinary symptoms. She is  ambulatory. She is in no distress. Explained to her we don't treat chronic pain ED, I will give her 15 tablets of Norco, 15 tablets of Valium to help with her acute pain. She is encouraged to keep her appointment today in followup. Also discussed excercises, water therapy, PT, weight management.     Filed Vitals:   01/06/14 0958  BP: 130/80  Pulse: 72  Temp: 98.3 F (36.8 C)  Resp: 16      Dionne Knoop A Kalai Baca, PA-C 01/06/14 1027

## 2014-01-06 NOTE — Discharge Instructions (Signed)
Continue all regular medications. Take norco as prescribed for severe pain. Valium for muscle spasms. Start doing stretches and exercises. Follow up with primary care doctor.    Back Pain, Adult Back pain is very common. The pain often gets better over time. The cause of back pain is usually not dangerous. Most people can learn to manage their back pain on their own.  HOME CARE   Stay active. Start with short walks on flat ground if you can. Try to walk farther each day.  Do not sit, drive, or stand in one place for more than 30 minutes. Do not stay in bed.  Do not avoid exercise or work. Activity can help your back heal faster.  Be careful when you bend or lift an object. Bend at your knees, keep the object close to you, and do not twist.  Sleep on a firm mattress. Lie on your side, and bend your knees. If you lie on your back, put a pillow under your knees.  Only take medicines as told by your doctor.  Put ice on the injured area.  Put ice in a plastic bag.  Place a towel between your skin and the bag.  Leave the ice on for 15-20 minutes, 03-04 times a day for the first 2 to 3 days. After that, you can switch between ice and heat packs.  Ask your doctor about back exercises or massage.  Avoid feeling anxious or stressed. Find good ways to deal with stress, such as exercise. GET HELP RIGHT AWAY IF:   Your pain does not go away with rest or medicine.  Your pain does not go away in 1 week.  You have new problems.  You do not feel well.  The pain spreads into your legs.  You cannot control when you poop (bowel movement) or pee (urinate).  Your arms or legs feel weak or lose feeling (numbness).  You feel sick to your stomach (nauseous) or throw up (vomit).  You have belly (abdominal) pain.  You feel like you may pass out (faint). MAKE SURE YOU:   Understand these instructions.  Will watch your condition.  Will get help right away if you are not doing well or get  worse. Document Released: 01/11/2008 Document Revised: 10/17/2011 Document Reviewed: 12/13/2010 [facility] [employer] Patient Information 2014 [place].  Back Exercises Back exercises help treat and prevent back injuries. The goal of back exercises is to increase the strength of your abdominal and back muscles and the flexibility of your back. These exercises should be started when you no longer have back pain. Back exercises include:  Pelvic Tilt. Lie on your back with your knees bent. Tilt your pelvis until the lower part of your back is against the floor. Hold this position 5 to 10 sec and repeat 5 to 10 times.  Knee to Chest. Pull first 1 knee up against your chest and hold for 20 to 30 seconds, repeat this with the other knee, and then both knees. This may be done with the other leg straight or bent, whichever feels better.  Sit-Ups or Curl-Ups. Bend your knees 90 degrees. Start with tilting your pelvis, and do a partial, slow sit-up, lifting your trunk only 30 to 45 degrees off the floor. Take at least 2 to 3 seconds for each sit-up. Do not do sit-ups with your knees out straight. If partial sit-ups are difficult, simply do the above but with only tightening your abdominal muscles and holding it as directed.  Hip-Lift.  Lie on your back with your knees flexed 90 degrees. Push down with your feet and shoulders as you raise your hips a couple inches off the floor; hold for 10 seconds, repeat 5 to 10 times.  Back arches. Lie on your stomach, propping yourself up on bent elbows. Slowly press on your hands, causing an arch in your low back. Repeat 3 to 5 times. Any initial stiffness and discomfort should lessen with repetition over time.  Shoulder-Lifts. Lie face down with arms beside your body. Keep hips and torso pressed to floor as you slowly lift your head and shoulders off the floor. Do not overdo your exercises, especially in the beginning. Exercises may cause you some mild back discomfort which  lasts for a few minutes; however, if the pain is more severe, or lasts for more than 15 minutes, do not continue exercises until you see your caregiver. Improvement with exercise therapy for back problems is slow.  See your caregivers for assistance with developing a proper back exercise program. Document Released: 09/01/2004 Document Revised: 10/17/2011 Document Reviewed: 05/26/2011 [facility] Patient Information 2014 [place], [place].

## 2014-01-06 NOTE — ED Notes (Signed)
Pt reports hx of back problems, neuropathy, sciatica and osteoarthritis. Pt reports back pain x4 days, pain 7/10. Reports she gets muscle spasms in lower back.

## 2014-01-27 ENCOUNTER — Other Ambulatory Visit: Payer: Self-pay | Admitting: Orthopedic Surgery

## 2014-01-27 ENCOUNTER — Encounter (HOSPITAL_BASED_OUTPATIENT_CLINIC_OR_DEPARTMENT_OTHER): Payer: Self-pay | Admitting: *Deleted

## 2014-01-27 NOTE — Progress Notes (Signed)
NPO AFTER MN WITH EXCEPTION CLEAR LIQUIDS UNTIL 0700 (NO CREAM/ MILK PRODUCTS).  ARRIVE AT 1100. NEEDS ISTAT. CURRENT EKG IN CHART AND EPIC. WILL TAKE NORVASC, GABAPENTIN, TYLENOL, PRAVASTATIN, AND IF NEEDED WILL TAKE ZANAFLEX  AM DOS W/ SIPS OF WATER. WILL BRING RESCUE INHALER.

## 2014-01-31 ENCOUNTER — Observation Stay (HOSPITAL_BASED_OUTPATIENT_CLINIC_OR_DEPARTMENT_OTHER)
Admission: RE | Admit: 2014-01-31 | Discharge: 2014-02-01 | Disposition: A | Discharge status: Home / Self Care | Payer: Medicaid Other | Source: Ambulatory Visit | Attending: Specialist | Admitting: Specialist

## 2014-01-31 ENCOUNTER — Encounter (HOSPITAL_BASED_OUTPATIENT_CLINIC_OR_DEPARTMENT_OTHER): Payer: Self-pay

## 2014-01-31 ENCOUNTER — Encounter (HOSPITAL_COMMUNITY)
Admission: RE | Disposition: A | Discharge status: Home / Self Care | Payer: Self-pay | Source: Ambulatory Visit | Attending: Specialist

## 2014-01-31 ENCOUNTER — Encounter (HOSPITAL_BASED_OUTPATIENT_CLINIC_OR_DEPARTMENT_OTHER): Payer: Medicaid Other | Admitting: Anesthesiology

## 2014-01-31 ENCOUNTER — Ambulatory Visit (HOSPITAL_BASED_OUTPATIENT_CLINIC_OR_DEPARTMENT_OTHER): Payer: Medicaid Other | Admitting: Anesthesiology

## 2014-01-31 DIAGNOSIS — IMO0002 Reserved for concepts with insufficient information to code with codable children: Secondary | ICD-10-CM | POA: Insufficient documentation

## 2014-01-31 DIAGNOSIS — K219 Gastro-esophageal reflux disease without esophagitis: Secondary | ICD-10-CM | POA: Insufficient documentation

## 2014-01-31 DIAGNOSIS — E785 Hyperlipidemia, unspecified: Secondary | ICD-10-CM | POA: Insufficient documentation

## 2014-01-31 DIAGNOSIS — F1210 Cannabis abuse, uncomplicated: Secondary | ICD-10-CM | POA: Insufficient documentation

## 2014-01-31 DIAGNOSIS — Z9889 Other specified postprocedural states: Secondary | ICD-10-CM

## 2014-01-31 DIAGNOSIS — G43909 Migraine, unspecified, not intractable, without status migrainosus: Secondary | ICD-10-CM | POA: Insufficient documentation

## 2014-01-31 DIAGNOSIS — J45909 Unspecified asthma, uncomplicated: Secondary | ICD-10-CM | POA: Insufficient documentation

## 2014-01-31 DIAGNOSIS — M2350 Chronic instability of knee, unspecified knee: Principal | ICD-10-CM | POA: Insufficient documentation

## 2014-01-31 DIAGNOSIS — M1990 Unspecified osteoarthritis, unspecified site: Secondary | ICD-10-CM | POA: Insufficient documentation

## 2014-01-31 DIAGNOSIS — I10 Essential (primary) hypertension: Secondary | ICD-10-CM | POA: Insufficient documentation

## 2014-01-31 DIAGNOSIS — F319 Bipolar disorder, unspecified: Secondary | ICD-10-CM | POA: Insufficient documentation

## 2014-01-31 DIAGNOSIS — M2240 Chondromalacia patellae, unspecified knee: Secondary | ICD-10-CM | POA: Insufficient documentation

## 2014-01-31 DIAGNOSIS — F17200 Nicotine dependence, unspecified, uncomplicated: Secondary | ICD-10-CM | POA: Insufficient documentation

## 2014-01-31 HISTORY — DX: Personal history of peptic ulcer disease: Z87.11

## 2014-01-31 HISTORY — DX: Other chronic pain: G89.29

## 2014-01-31 HISTORY — PX: KNEE ARTHROSCOPY WITH ANTERIOR CRUCIATE LIGAMENT (ACL) REPAIR WITH HAMSTRING GRAFT: SHX5645

## 2014-01-31 HISTORY — DX: Sprain of anterior cruciate ligament of right knee, initial encounter: S83.511A

## 2014-01-31 HISTORY — DX: Personal history of other diseases of the digestive system: Z87.19

## 2014-01-31 HISTORY — DX: Unspecified tear of unspecified meniscus, current injury, right knee, initial encounter: S83.206A

## 2014-01-31 HISTORY — DX: Bipolar disorder, unspecified: F31.9

## 2014-01-31 HISTORY — DX: Low back pain: M54.5

## 2014-01-31 HISTORY — DX: Presence of spectacles and contact lenses: Z97.3

## 2014-01-31 HISTORY — DX: Hyperlipidemia, unspecified: E78.5

## 2014-01-31 LAB — POCT I-STAT, CHEM 8
BUN: 8 mg/dL (ref 6–23)
Calcium, Ion: 1.24 mmol/L — ABNORMAL HIGH (ref 1.12–1.23)
Chloride: 103 mEq/L (ref 96–112)
Creatinine, Ser: 0.9 mg/dL (ref 0.50–1.10)
Glucose, Bld: 103 mg/dL — ABNORMAL HIGH (ref 70–99)
HCT: 46 % (ref 36.0–46.0)
Hemoglobin: 15.6 g/dL — ABNORMAL HIGH (ref 12.0–15.0)
Potassium: 3.7 mEq/L (ref 3.7–5.3)
Sodium: 142 mEq/L (ref 137–147)
TCO2: 24 mmol/L (ref 0–100)

## 2014-01-31 SURGERY — KNEE ARTHROSCOPY WITH ANTERIOR CRUCIATE LIGAMENT (ACL) REPAIR WITH HAMSTRING GRAFT
Anesthesia: General | Site: Knee | Laterality: Right

## 2014-01-31 MED ORDER — HYDROMORPHONE HCL 2 MG PO TABS
2.0000 mg | ORAL_TABLET | ORAL | Status: DC | PRN
  Administered 2014-01-31 – 2014-02-01 (×5): 2 mg via ORAL
  Filled 2014-01-31 (×5): qty 1

## 2014-01-31 MED ORDER — LIDOCAINE HCL (CARDIAC) 20 MG/ML IV SOLN
INTRAVENOUS | Status: DC | PRN
  Administered 2014-01-31: 100 mg via INTRAVENOUS

## 2014-01-31 MED ORDER — MIDAZOLAM HCL 2 MG/2ML IJ SOLN
INTRAMUSCULAR | Status: AC
  Filled 2014-01-31: qty 2

## 2014-01-31 MED ORDER — LACTATED RINGERS IV SOLN
INTRAVENOUS | Status: DC
  Filled 2014-01-31: qty 1000

## 2014-01-31 MED ORDER — ACETAMINOPHEN 10 MG/ML IV SOLN
INTRAVENOUS | Status: DC | PRN
  Administered 2014-01-31: 1000 mg via INTRAVENOUS

## 2014-01-31 MED ORDER — SODIUM CHLORIDE 0.9 % IV SOLN
INTRAVENOUS | Status: DC
  Filled 2014-01-31: qty 1000

## 2014-01-31 MED ORDER — POVIDONE-IODINE 7.5 % EX SOLN
Freq: Once | CUTANEOUS | Status: DC
  Filled 2014-01-31: qty 118

## 2014-01-31 MED ORDER — ALBUTEROL SULFATE HFA 108 (90 BASE) MCG/ACT IN AERS
2.0000 | INHALATION_SPRAY | Freq: Four times a day (QID) | RESPIRATORY_TRACT | Status: DC | PRN
  Filled 2014-01-31: qty 6.7

## 2014-01-31 MED ORDER — MIDAZOLAM HCL 5 MG/5ML IJ SOLN
INTRAMUSCULAR | Status: DC | PRN
  Administered 2014-01-31: 2 mg via INTRAVENOUS

## 2014-01-31 MED ORDER — ROPIVACAINE HCL 5 MG/ML IJ SOLN
INTRAMUSCULAR | Status: DC | PRN
  Administered 2014-01-31: 30 mL via PERINEURAL

## 2014-01-31 MED ORDER — FENTANYL CITRATE 0.05 MG/ML IJ SOLN
INTRAMUSCULAR | Status: DC | PRN
  Administered 2014-01-31 (×2): 100 ug via INTRAVENOUS
  Administered 2014-01-31 (×2): 50 ug via INTRAVENOUS

## 2014-01-31 MED ORDER — ALBUTEROL SULFATE (2.5 MG/3ML) 0.083% IN NEBU: 2.5000 mg | INHALATION_SOLUTION | Freq: Four times a day (QID) | RESPIRATORY_TRACT | Status: DC | PRN

## 2014-01-31 MED ORDER — HYDROMORPHONE HCL PF 1 MG/ML IJ SOLN
1.0000 mg | INTRAMUSCULAR | Status: DC | PRN
  Administered 2014-01-31 – 2014-02-01 (×2): 1 mg via INTRAVENOUS
  Filled 2014-01-31 (×4): qty 1

## 2014-01-31 MED ORDER — AMLODIPINE BESYLATE 2.5 MG PO TABS
2.5000 mg | ORAL_TABLET | Freq: Every morning | ORAL | Status: DC
  Administered 2014-02-01: 2.5 mg via ORAL
  Filled 2014-01-31 (×2): qty 1

## 2014-01-31 MED ORDER — CEPHALEXIN 500 MG PO CAPS: 500.0000 mg | ORAL_CAPSULE | Freq: Three times a day (TID) | ORAL | Status: DC

## 2014-01-31 MED ORDER — FENTANYL CITRATE 0.05 MG/ML IJ SOLN
INTRAMUSCULAR | Status: AC
  Filled 2014-01-31: qty 2

## 2014-01-31 MED ORDER — PROPOFOL 10 MG/ML IV BOLUS
INTRAVENOUS | Status: DC | PRN
  Administered 2014-01-31: 200 mg via INTRAVENOUS

## 2014-01-31 MED ORDER — METOCLOPRAMIDE HCL 5 MG PO TABS
5.0000 mg | ORAL_TABLET | Freq: Three times a day (TID) | ORAL | Status: DC | PRN
  Filled 2014-01-31: qty 2

## 2014-01-31 MED ORDER — FENTANYL CITRATE 0.05 MG/ML IJ SOLN
100.0000 ug | Freq: Once | INTRAMUSCULAR | Status: AC
  Administered 2014-01-31: 100 ug via INTRAVENOUS
  Filled 2014-01-31: qty 2

## 2014-01-31 MED ORDER — MIDAZOLAM HCL 2 MG/2ML IJ SOLN
2.0000 mg | Freq: Once | INTRAMUSCULAR | Status: AC
  Administered 2014-01-31: 2 mg via INTRAVENOUS
  Filled 2014-01-31: qty 2

## 2014-01-31 MED ORDER — SODIUM CHLORIDE 0.9 % IR SOLN
Status: DC | PRN
  Administered 2014-01-31: 18000 mL

## 2014-01-31 MED ORDER — MORPHINE SULFATE 4 MG/ML IJ SOLN
INTRAMUSCULAR | Status: AC
  Filled 2014-01-31: qty 1

## 2014-01-31 MED ORDER — BUPIVACAINE HCL (PF) 0.25 % IJ SOLN
INTRAMUSCULAR | Status: DC | PRN
  Administered 2014-01-31: 20 mL

## 2014-01-31 MED ORDER — TIZANIDINE HCL 4 MG PO TABS
4.0000 mg | ORAL_TABLET | Freq: Three times a day (TID) | ORAL | Status: DC | PRN
  Filled 2014-01-31: qty 1

## 2014-01-31 MED ORDER — QUETIAPINE FUMARATE 100 MG PO TABS
100.0000 mg | ORAL_TABLET | Freq: Every day | ORAL | Status: DC
  Administered 2014-01-31: 100 mg via ORAL
  Filled 2014-01-31 (×3): qty 1

## 2014-01-31 MED ORDER — SUCCINYLCHOLINE CHLORIDE 20 MG/ML IJ SOLN
INTRAMUSCULAR | Status: DC | PRN
  Administered 2014-01-31: 140 mg via INTRAVENOUS

## 2014-01-31 MED ORDER — HYDROMORPHONE HCL PF 1 MG/ML IJ SOLN
INTRAMUSCULAR | Status: AC
  Filled 2014-01-31: qty 1

## 2014-01-31 MED ORDER — EPHEDRINE SULFATE 50 MG/ML IJ SOLN
INTRAMUSCULAR | Status: DC | PRN
  Administered 2014-01-31: 10 mg via INTRAVENOUS

## 2014-01-31 MED ORDER — DEXAMETHASONE SODIUM PHOSPHATE 10 MG/ML IJ SOLN
INTRAMUSCULAR | Status: DC | PRN
  Administered 2014-01-31: 10 mg via INTRAVENOUS

## 2014-01-31 MED ORDER — METHOCARBAMOL 500 MG PO TABS: 500.0000 mg | ORAL_TABLET | Freq: Four times a day (QID) | ORAL | Status: DC | PRN

## 2014-01-31 MED ORDER — HYDROMORPHONE HCL 2 MG PO TABS: 2.0000 mg | ORAL_TABLET | ORAL | Status: DC | PRN

## 2014-01-31 MED ORDER — ACETAMINOPHEN 500 MG PO TABS: 1000.0000 mg | ORAL_TABLET | Freq: Two times a day (BID) | ORAL | Status: DC

## 2014-01-31 MED ORDER — HYDROMORPHONE HCL PF 1 MG/ML IJ SOLN
0.2500 mg | INTRAMUSCULAR | Status: DC | PRN
  Administered 2014-01-31 (×5): 0.5 mg via INTRAVENOUS
  Filled 2014-01-31: qty 1

## 2014-01-31 MED ORDER — ONDANSETRON HCL 4 MG/2ML IJ SOLN
INTRAMUSCULAR | Status: DC | PRN
  Administered 2014-01-31: 4 mg via INTRAVENOUS

## 2014-01-31 MED ORDER — DIAZEPAM 5 MG PO TABS
5.0000 mg | ORAL_TABLET | Freq: Three times a day (TID) | ORAL | Status: DC | PRN
  Administered 2014-02-01: 5 mg via ORAL
  Filled 2014-01-31 (×2): qty 1

## 2014-01-31 MED ORDER — ASPIRIN EC 325 MG PO TBEC: 325.0000 mg | DELAYED_RELEASE_TABLET | Freq: Two times a day (BID) | ORAL | Status: DC

## 2014-01-31 MED ORDER — LISINOPRIL-HYDROCHLOROTHIAZIDE 20-12.5 MG PO TABS: 1.0000 | ORAL_TABLET | Freq: Every morning | ORAL | Status: DC

## 2014-01-31 MED ORDER — METHOCARBAMOL 500 MG PO TABS
500.0000 mg | ORAL_TABLET | Freq: Four times a day (QID) | ORAL | Status: DC | PRN
  Administered 2014-01-31 – 2014-02-01 (×2): 500 mg via ORAL
  Filled 2014-01-31 (×2): qty 1

## 2014-01-31 MED ORDER — METOCLOPRAMIDE HCL 5 MG/ML IJ SOLN
5.0000 mg | Freq: Three times a day (TID) | INTRAMUSCULAR | Status: DC | PRN
  Filled 2014-01-31: qty 2

## 2014-01-31 MED ORDER — ACETAMINOPHEN 500 MG PO TABS
1000.0000 mg | ORAL_TABLET | Freq: Four times a day (QID) | ORAL | Status: DC | PRN
  Filled 2014-01-31: qty 2

## 2014-01-31 MED ORDER — MORPHINE SULFATE 4 MG/ML IJ SOLN
INTRAMUSCULAR | Status: DC | PRN
  Administered 2014-01-31: 4 mg via SUBCUTANEOUS

## 2014-01-31 MED ORDER — GABAPENTIN 300 MG PO CAPS
300.0000 mg | ORAL_CAPSULE | Freq: Two times a day (BID) | ORAL | Status: DC
  Administered 2014-01-31 – 2014-02-01 (×2): 300 mg via ORAL
  Filled 2014-01-31 (×4): qty 1

## 2014-01-31 MED ORDER — PROMETHAZINE HCL 25 MG/ML IJ SOLN
6.2500 mg | INTRAMUSCULAR | Status: DC | PRN
  Filled 2014-01-31: qty 1

## 2014-01-31 MED ORDER — HYDROMORPHONE HCL PF 1 MG/ML IJ SOLN
1.0000 mg | Freq: Once | INTRAMUSCULAR | Status: DC
  Filled 2014-01-31: qty 1

## 2014-01-31 MED ORDER — FENTANYL CITRATE 0.05 MG/ML IJ SOLN
INTRAMUSCULAR | Status: AC
Start: 2014-01-31 — End: 2014-01-31
  Filled 2014-01-31: qty 4

## 2014-01-31 MED ORDER — HYDROMORPHONE HCL 2 MG PO TABS
ORAL_TABLET | ORAL | Status: AC
  Filled 2014-01-31: qty 1

## 2014-01-31 MED ORDER — METHOCARBAMOL 500 MG PO TABS
ORAL_TABLET | ORAL | Status: AC
  Filled 2014-01-31: qty 1

## 2014-01-31 MED ORDER — CEFAZOLIN SODIUM-DEXTROSE 2-3 GM-% IV SOLR
2.0000 g | INTRAVENOUS | Status: AC
  Administered 2014-01-31: 2 g via INTRAVENOUS
  Filled 2014-01-31: qty 50

## 2014-01-31 MED ORDER — LACTATED RINGERS IV SOLN
INTRAVENOUS | Status: DC
  Administered 2014-01-31: 11:00:00 via INTRAVENOUS
  Filled 2014-01-31: qty 1000

## 2014-01-31 MED ORDER — SIMVASTATIN 40 MG PO TABS
40.0000 mg | ORAL_TABLET | Freq: Every day | ORAL | Status: DC
  Filled 2014-01-31 (×2): qty 1

## 2014-01-31 MED ORDER — ACETAMINOPHEN 325 MG PO TABS
650.0000 mg | ORAL_TABLET | Freq: Four times a day (QID) | ORAL | Status: DC | PRN
  Filled 2014-01-31: qty 2

## 2014-01-31 SURGICAL SUPPLY — 85 items
ANCHOR BUTTON TIGHTROPE BTB (Anchor) ×3 IMPLANT
ANCHOR BUTTON TIGHTROPE RN 14 (Anchor) ×3 IMPLANT
ANCHOR PUSHLOCK BIOCOMP 3.5X19 (Orthopedic Implant) ×3 IMPLANT
BANDAGE ESMARK 6X9 LF (GAUZE/BANDAGES/DRESSINGS) ×1 IMPLANT
BANDAGE GAUZE ELAST BULKY 4 IN (GAUZE/BANDAGES/DRESSINGS) ×3 IMPLANT
BLADE 4.2CUDA (BLADE) IMPLANT
BLADE CUDA 5.5 (BLADE) ×3 IMPLANT
BLADE CUDA GRT WHITE 3.5 (BLADE) IMPLANT
BLADE SURG 10 STRL SS (BLADE) ×3 IMPLANT
BLADE SURG 15 STRL LF DISP TIS (BLADE) ×1 IMPLANT
BLADE SURG 15 STRL SS (BLADE) ×2
BNDG ESMARK 6X9 LF (GAUZE/BANDAGES/DRESSINGS) ×3
BNDG GAUZE ELAST 4 BULKY (GAUZE/BANDAGES/DRESSINGS) ×6 IMPLANT
BUR OVAL 6.0 (BURR) IMPLANT
BUR VERTEX HOODED 4.5 (BURR) ×3 IMPLANT
BUTTON EXT TIGHTROPE 5X20 (Orthopedic Implant) ×2 IMPLANT
BUTTON EXT TIGHTROPE 5X20MM (Orthopedic Implant) ×1 IMPLANT
CANISTER SUCT LVC 12 LTR MEDI- (MISCELLANEOUS) ×6 IMPLANT
CANISTER SUCTION 1200CC (MISCELLANEOUS) ×3 IMPLANT
CANISTER SUCTION 2500CC (MISCELLANEOUS) IMPLANT
CLOSURE WOUND 1/2 X4 (GAUZE/BANDAGES/DRESSINGS) ×1
CLOTH BEACON ORANGE TIMEOUT ST (SAFETY) ×3 IMPLANT
COVER TABLE BACK 60X90 (DRAPES) ×3 IMPLANT
DRAPE ARTHROSCOPY W/POUCH 114 (DRAPES) ×3 IMPLANT
DRAPE INCISE IOBAN 66X45 STRL (DRAPES) ×3 IMPLANT
DRAPE LG THREE QUARTER DISP (DRAPES) ×9 IMPLANT
DRAPE OEC MINIVIEW 54X84 (DRAPES) ×6 IMPLANT
DRAPE U-SHAPE 47X51 STRL (DRAPES) ×3 IMPLANT
DRILL FLIPCUTTER II 10MM (CUTTER) ×1 IMPLANT
DRSG PAD ABDOMINAL 8X10 ST (GAUZE/BANDAGES/DRESSINGS) ×3 IMPLANT
DURAPREP 26ML APPLICATOR (WOUND CARE) ×3 IMPLANT
ELECT REM PT RETURN 9FT ADLT (ELECTROSURGICAL) ×3
ELECTRODE REM PT RTRN 9FT ADLT (ELECTROSURGICAL) ×1 IMPLANT
FIBERSTICK 2 (SUTURE) ×6 IMPLANT
FLIPCUTTER II 10MM (CUTTER) ×3
GAUZE XEROFORM 1X8 LF (GAUZE/BANDAGES/DRESSINGS) ×3 IMPLANT
GLOVE BIO SURGEON STRL SZ7.5 (GLOVE) ×3 IMPLANT
GLOVE INDICATOR 8.0 STRL GRN (GLOVE) ×3 IMPLANT
GLOVE SURG ORTHO 8.0 STRL STRW (GLOVE) ×3 IMPLANT
GOWN PREVENTION PLUS LG XLONG (DISPOSABLE) IMPLANT
GOWN STRL REIN XL XLG (GOWN DISPOSABLE) IMPLANT
GOWN STRL REUS W/ TWL LRG LVL3 (GOWN DISPOSABLE) ×2 IMPLANT
GOWN STRL REUS W/TWL LRG LVL3 (GOWN DISPOSABLE) ×4
GOWN STRL REUS W/TWL XL LVL3 (GOWN DISPOSABLE) ×6 IMPLANT
IMMOBILIZER KNEE 22 UNIV (SOFTGOODS) ×3 IMPLANT
IV NS IRRIG 3000ML ARTHROMATIC (IV SOLUTION) ×18 IMPLANT
KIT IMPLANT TIGHTROPE ABS OPEN (Anchor) ×3 IMPLANT
KIT TRANSTIBIAL (DISPOSABLE) IMPLANT
KNEE WRAP E Z 3 GEL PACK (MISCELLANEOUS) ×3 IMPLANT
MINI VAC (SURGICAL WAND) IMPLANT
NEEDLE HYPO 22GX1.5 SAFETY (NEEDLE) IMPLANT
PACK ARTHROSCOPY DSU (CUSTOM PROCEDURE TRAY) ×3 IMPLANT
PACK BASIN DAY SURGERY FS (CUSTOM PROCEDURE TRAY) ×3 IMPLANT
PAD ABD 8X10 STRL (GAUZE/BANDAGES/DRESSINGS) ×6 IMPLANT
PADDING CAST ABS 4INX4YD NS (CAST SUPPLIES) ×2
PADDING CAST ABS COTTON 4X4 ST (CAST SUPPLIES) ×1 IMPLANT
PENCIL BUTTON HOLSTER BLD 10FT (ELECTRODE) ×3 IMPLANT
SET ARTHROSCOPY TUBING (MISCELLANEOUS) ×2
SET ARTHROSCOPY TUBING LN (MISCELLANEOUS) ×1 IMPLANT
SET PAD KNEE POSITIONER (MISCELLANEOUS) ×3 IMPLANT
SPONGE GAUZE 4X4 12PLY (GAUZE/BANDAGES/DRESSINGS) ×6 IMPLANT
SPONGE GAUZE 4X4 12PLY STER LF (GAUZE/BANDAGES/DRESSINGS) ×3 IMPLANT
SPONGE LAP 4X18 X RAY DECT (DISPOSABLE) ×3 IMPLANT
STRIP CLOSURE SKIN 1/2X4 (GAUZE/BANDAGES/DRESSINGS) ×2 IMPLANT
SUCTION FRAZIER TIP 10 FR DISP (SUCTIONS) ×3 IMPLANT
SUT 2 FIBERLOOP 20 STRT BLUE (SUTURE) ×6
SUT ETHILON 4 0 PS 2 18 (SUTURE) ×6 IMPLANT
SUT FIBERWIRE #2 38 T-5 BLUE (SUTURE)
SUT MNCRL AB 3-0 PS2 18 (SUTURE) IMPLANT
SUT VIC AB 0 CT1 36 (SUTURE) IMPLANT
SUT VIC AB 0 CT2 27 (SUTURE) IMPLANT
SUT VIC AB 2-0 CT1 27 (SUTURE) ×2
SUT VIC AB 2-0 CT1 TAPERPNT 27 (SUTURE) ×1 IMPLANT
SUTURE 2 FIBERLOOP 20 STRT BLU (SUTURE) ×2 IMPLANT
SUTURE FIBERWR #2 38 T-5 BLUE (SUTURE) IMPLANT
SUTURE TIGERSTICK 2 TIGERWIR 2 (MISCELLANEOUS) ×2 IMPLANT
SYR CONTROL 10ML LL (SYRINGE) IMPLANT
TIGERSTICK 2 TIGERWIRE 2 (MISCELLANEOUS) ×6
TISSUE GRAFTLINK FGL (Tissue) ×3 IMPLANT
TOWEL OR 17X24 6PK STRL BLUE (TOWEL DISPOSABLE) ×3 IMPLANT
TUBE CONNECTING 12'X1/4 (SUCTIONS) ×2
TUBE CONNECTING 12X1/4 (SUCTIONS) ×4 IMPLANT
WAND 30 DEG SABER W/CORD (SURGICAL WAND) IMPLANT
WAND 90 DEG TURBOVAC W/CORD (SURGICAL WAND) IMPLANT
WATER STERILE IRR 500ML POUR (IV SOLUTION) ×3 IMPLANT

## 2014-01-31 NOTE — H&P (View-Only) (Signed)
NPO AFTER MN WITH EXCEPTION CLEAR LIQUIDS UNTIL 0700 (NO CREAM/ MILK PRODUCTS).  ARRIVE AT 1100. NEEDS ISTAT. CURRENT EKG IN CHART AND EPIC. WILL TAKE NORVASC, GABAPENTIN, TYLENOL, PRAVASTATIN, AND IF NEEDED WILL TAKE ZANAFLEX  AM DOS W/ SIPS OF WATER. WILL BRING RESCUE INHALER.

## 2014-01-31 NOTE — Discharge Instructions (Signed)
°  Post Anesthesia Home Care Instructions ° °Activity: °Get plenty of rest for the remainder of the day. A responsible adult should stay with you for 24 hours following the procedure.  °For the next 24 hours, DO NOT: °-Drive a car °-Operate machinery °-Drink alcoholic beverages °-Take any medication unless instructed by your physician °-Make any legal decisions or sign important papers. ° °Meals: °Start with liquid foods such as gelatin or soup. Progress to regular foods as tolerated. Avoid greasy, spicy, heavy foods. If nausea and/or vomiting occur, drink only clear liquids until the nausea and/or vomiting subsides. Call your physician if vomiting continues. ° °Special Instructions/Symptoms: °Your throat may feel dry or sore from the anesthesia or the breathing tube placed in your throat during surgery. If this causes discomfort, gargle with warm salt water. The discomfort should disappear within 24 hours. ° °Regional Anesthesia Blocks ° °1. Numbness or the inability to move the "blocked" extremity may last from 3-48 hours after placement. The length of time depends on the medication injected and your individual response to the medication. If the numbness is not going away after 48 hours, call your surgeon. ° °2. The extremity that is blocked will need to be protected until the numbness is gone and the  Strength has returned. Because you cannot feel it, you will need to take extra care to avoid injury. Because it may be weak, you may have difficulty moving it or using it. You may not know what position it is in without looking at it while the block is in effect. ° °3. For blocks in the legs and feet, returning to weight bearing and walking needs to be done carefully. You will need to wait until the numbness is entirely gone and the strength has returned. You should be able to move your leg and foot normally before you try and bear weight or walk. You will need someone to be with you when you first try to ensure you  do not fall and possibly risk injury. ° °4. Bruising and tenderness at the needle site are common side effects and will resolve in a few days. ° °5. Persistent numbness or new problems with movement should be communicated to the surgeon or the [facility] ([phone])/  Surgery Center ([phone]). °

## 2014-01-31 NOTE — Anesthesia Procedure Notes (Addendum)
Anesthesia Regional Block:  Femoral nerve block  Pre-Anesthetic Checklist: ,, timeout performed, Correct Patient, Correct Site, Correct Laterality, Correct Procedure, Correct Position, site marked, Risks and benefits discussed,  Surgical consent,  Pre-op evaluation,  At surgeon's request and post-op pain management  Laterality: Right  Prep: Dura Prep       Needles:  Injection technique: Single-shot  Needle Type: Echogenic Stimulator Needle          Additional Needles:  Procedures: ultrasound guided (picture in chart) Femoral nerve block  Nerve Stimulator or Paresthesia:  Response: 0.5 mA,   Additional Responses:   Narrative:  Start time: 01/31/2014 12:20 PM End time: 01/31/2014 12:25 PM Injection made incrementally with aspirations every 5 mL.  Performed by: Personally  Anesthesiologist: Lucille PassyAlexander Fortune MD  Additional Notes: No complications noted.   Procedure Name: LMA Insertion Date/Time: 01/31/2014 1:26 PM Performed by: Tyrone NineSAUVE, Melbert Botelho F Pre-anesthesia Checklist: Patient identified, Emergency Drugs available, Suction available, Timeout performed and Patient being monitored Patient Re-evaluated:Patient Re-evaluated prior to inductionOxygen Delivery Method: Circle system utilized Preoxygenation: Pre-oxygenation with 100% oxygen Intubation Type: IV induction Ventilation: Mask ventilation without difficulty LMA: LMA inserted LMA Size: 4.0 Number of attempts: 1 Placement Confirmation: positive ETCO2 Tube secured with: Tape Dental Injury: Teeth and Oropharynx as per pre-operative assessment

## 2014-01-31 NOTE — Anesthesia Preprocedure Evaluation (Addendum)
Anesthesia Evaluation  Patient identified by MRN, date of birth, ID band Patient awake    Reviewed: Allergy & Precautions, H&P , NPO status , Patient's Chart, lab work & pertinent test results  Airway Mallampati: II TM Distance: >3 FB Neck ROM: Full    Dental  (+) Chipped, Dental Advisory Given,    Pulmonary neg pulmonary ROS, asthma , Current Smoker,  breath sounds clear to auscultation  Pulmonary exam normal       Cardiovascular hypertension, Pt. on medications Rhythm:Regular Rate:Normal  26-Jul-2013 Normal sinus rhythm Normal ECG   Neuro/Psych  Headaches, Depression Bipolar Disorder negative neurological ROS     GI/Hepatic Neg liver ROS, GERD-  Medicated and Controlled,(+)     substance abuse (denies cocaine use in past 9 years)  alcohol use, cocaine use and marijuana use,   Endo/Other  negative endocrine ROS  Renal/GU negative Renal ROS  negative genitourinary   Musculoskeletal negative musculoskeletal ROS (+) Arthritis -, Osteoarthritis,    Abdominal   Peds  Hematology negative hematology ROS (+)   Anesthesia Other Findings   Reproductive/Obstetrics                         Anesthesia Physical Anesthesia Plan  ASA: III  Anesthesia Plan: General   Post-op Pain Management:    Induction: Intravenous  Airway Management Planned: LMA  Additional Equipment:   Intra-op Plan:   Post-operative Plan: Extubation in OR  Informed Consent: I have reviewed the patients History and Physical, chart, labs and discussed the procedure including the risks, benefits and alternatives for the proposed anesthesia with the patient or authorized representative who has indicated his/her understanding and acceptance.   Dental advisory given  Plan Discussed with: CRNA  Anesthesia Plan Comments:         Anesthesia Quick Evaluation

## 2014-01-31 NOTE — Interval H&P Note (Signed)
History and Physical Interval Note:  01/31/2014 1:03 PM  [name] RippleStephanie L [name]  has presented today for surgery, with the diagnosis of RIGHT KNEE ACL DEFORMITY   The various methods of treatment have been discussed with the patient and family. After consideration of risks, benefits and other options for treatment, the patient has consented to  Procedure(s): RIGHT KNEE ARTHROSCOPY WITH ALLOGRAFT (ACL) ANTERIOR CRUCIATE LIGAMENT RECONSTRUCTON PARTIAL MENISCECTOMY VERSES REPAIR  (Right) as a surgical intervention .  The patient's history has been reviewed, patient examined, no change in status, stable for surgery.  I have reviewed the patient's chart and labs.  Questions were answered to the patient's satisfaction.     [name] ANDREW

## 2014-01-31 NOTE — Anesthesia Postprocedure Evaluation (Signed)
  Anesthesia Post-op Note  Patient: Thomasene RippleStephanie L Felkins  Procedure(s) Performed: Procedure(s) (LRB): RIGHT KNEE ARTHROSCOPY WITH ALLOGRAFT (ACL) ANTERIOR CRUCIATE LIGAMENT RECONSTRUCTON PARTIAL MENISCECTOMY VERSES REPAIR  (Right)  Patient Location: PACU  Anesthesia Type: GA combined with regional for post-op pain  Level of Consciousness: awake and alert   Airway and Oxygen Therapy: Patient Spontanous Breathing  Post-op Pain: mild  Post-op Assessment: Post-op Vital signs reviewed, Patient's Cardiovascular Status Stable, Respiratory Function Stable, Patent Airway and No signs of Nausea or vomiting  Last Vitals:  Filed Vitals:   01/31/14 1715  BP: 158/104  Pulse: 64  Temp:   Resp: 14    Post-op Vital Signs: stable   Complications: No apparent anesthesia complications

## 2014-01-31 NOTE — Transfer of Care (Signed)
Immediate Anesthesia Transfer of Care Note  Patient: [name] RippleStephanie L [name]  Procedure(s) Performed: Procedure(s): RIGHT KNEE ARTHROSCOPY WITH ALLOGRAFT (ACL) ANTERIOR CRUCIATE LIGAMENT RECONSTRUCTON PARTIAL MENISCECTOMY VERSES REPAIR  (Right)  Patient Location: PACU  Anesthesia Type:General  Level of Consciousness: awake, alert , oriented and patient cooperative  Airway & Oxygen Therapy: Patient Spontanous Breathing and Patient connected to face mask oxygen  Post-op Assessment: Report given to PACU RN and Post -op Vital signs reviewed and stable  Post vital signs: Reviewed and stable  Complications: No apparent anesthesia complications

## 2014-01-31 NOTE — H&P (Signed)
[name] [name] is an 48 y.o. female.   Chief Complaint: right knee pain HPI: Patient presents with joint discomfort that had been persistent for some time now. Despite conservative treatments, her discomfort has not improved. Imaging was obtained. Other conservative and surgical treatments were discussed in detail. Patient wishes to proceed with surgery as consented. Denies SOB, CP, or calf pain. No Fever, chills, or nausea/ vomiting.   Past Medical History  Diagnosis Date  . Hypertension   . Migraine   . DDD (degenerative disc disease)   . Sciatica   . Osteoarthritis   . Asthma   . GERD (gastroesophageal reflux disease)   . Right ACL tear   . Right knee meniscal tear   . Bipolar 1 disorder   . Depression   . Hyperlipidemia   . Chronic lumbar pain   . History of gastric ulcer   . Wears glasses     Past Surgical History  Procedure Laterality Date  . Bunionectomy Left 2011  . Heel spur surgery Right 2012  . Total abdominal hysterectomy w/ bilateral salpingoophorectomy  04-20-2006    Family History  Problem Relation Age of Onset  . Hypertension Father    Social History:  reports that she has been smoking Cigarettes.  She has a 16 pack-year smoking history. She has never used smokeless tobacco. She reports that she drinks about 8.4 ounces of alcohol per week. She reports that she uses illicit drugs (Marijuana).  Allergies:  Allergies  Allergen Reactions  . Diphenhydramine Hcl Itching    Medications Prior to Admission  Medication Sig Dispense Refill  . acetaminophen (TYLENOL) 500 MG tablet Take 1,000 mg by mouth every 6 (six) hours as needed.      Marland Kitchen. albuterol (PROVENTIL HFA;VENTOLIN HFA) 108 (90 BASE) MCG/ACT inhaler Inhale 2 puffs into the lungs every 6 (six) hours as needed for wheezing or shortness of breath.      Marland Kitchen. amLODipine (NORVASC) 2.5 MG tablet Take 2.5 mg by mouth every morning.       Marland Kitchen. BIOTIN PO Take 1 tablet by mouth daily.      . Calcium Carbonate Antacid  (ALKA-SELTZER ANTACID PO) Take by mouth as needed.      . diazepam (VALIUM) 5 MG tablet Take 1 tablet (5 mg total) by mouth every 8 (eight) hours as needed for muscle spasms.  15 tablet  0  . gabapentin (NEURONTIN) 300 MG capsule Take 300 mg by mouth 2 (two) times daily.      Marland Kitchen. ketoprofen (ORUDIS) 75 MG capsule Take 75 mg by mouth every 8 (eight) hours as needed.       Marland Kitchen. lisinopril-hydrochlorothiazide (PRINZIDE,ZESTORETIC) 20-12.5 MG per tablet Take 1 tablet by mouth every morning.       . pravastatin (PRAVACHOL) 40 MG tablet Take 40 mg by mouth every morning.       Marland Kitchen. QUEtiapine (SEROQUEL) 100 MG tablet Take 100 mg by mouth at bedtime.      . SUMAtriptan (IMITREX) 50 MG tablet Take 50 mg by mouth every 2 (two) hours as needed for migraine or headache. May repeat in 2 hours if headache persists or recurs.      Marland Kitchen. tiZANidine (ZANAFLEX) 4 MG tablet Take 4 mg by mouth every 8 (eight) hours as needed for muscle spasms.        Results for orders placed during the hospital encounter of 01/31/14 (from the past 48 hour(s))  POCT I-STAT, CHEM 8     Status: Abnormal  Collection Time    01/31/14 11:29 AM      Result Value Ref Range   Sodium 142  137 - 147 mEq/L   Potassium 3.7  3.7 - 5.3 mEq/L   Chloride 103  96 - 112 mEq/L   BUN 8  6 - 23 mg/dL   Creatinine, Ser 1.610.90  0.50 - 1.10 mg/dL   Glucose, Bld 096103 (*) 70 - 99 mg/dL   Calcium, Ion 0.451.24 (*) 1.12 - 1.23 mmol/L   TCO2 24  0 - 100 mmol/L   Hemoglobin 15.6 (*) 12.0 - 15.0 g/dL   HCT 40.946.0  81.136.0 - 91.446.0 %   No results found.  Review of Systems  Constitutional: Negative.   Eyes: Negative.   Respiratory: Positive for cough.   Cardiovascular: Negative.   Gastrointestinal: Negative.   Genitourinary: Negative.   Musculoskeletal: Positive for joint pain.  Skin: Negative.   Neurological: Negative.   Endo/Heme/Allergies: Negative.   Psychiatric/Behavioral: Negative.     Blood pressure 147/90, pulse 74, temperature 97.9 F (36.6 C), temperature  source Oral, resp. rate 16, height 5\' 4"  (1.626 m), weight 82.328 kg (181 lb 8 oz), SpO2 99.00%. Physical Exam  Constitutional: She is oriented to person, place, and time. She appears well-developed.  HENT:  Head: Normocephalic.  Eyes: EOM are normal.  Neck: Normal range of motion.  Cardiovascular: Normal rate, regular rhythm, normal heart sounds and intact distal pulses.   Respiratory: Effort normal.  GI: Soft.  Genitourinary:  Deferred  Musculoskeletal: She exhibits edema and tenderness.  Right knee. Calf soft and non tender  Neurological: She is alert and oriented to person, place, and time.  Skin: Skin is warm and dry.  Psychiatric: Her behavior is normal.     Assessment/Plan Right knee ACL tear and meniscus: Right ACL allograft reconstruction and menisectomy D/c home today Follow instructions Take meds as directed Questions encouraged and answered in detail  [name] [name] L 01/31/2014, 12:53 PM

## 2014-01-31 NOTE — Op Note (Signed)
[phone]#[id number]

## 2014-02-01 MED ORDER — PRAVASTATIN SODIUM 40 MG PO TABS
40.0000 mg | ORAL_TABLET | Freq: Every day | ORAL | Status: DC
  Filled 2014-02-01: qty 1

## 2014-02-01 MED ORDER — LISINOPRIL 20 MG PO TABS
20.0000 mg | ORAL_TABLET | Freq: Every day | ORAL | Status: DC
  Administered 2014-02-01: 20 mg via ORAL
  Filled 2014-02-01: qty 1

## 2014-02-01 MED ORDER — HYDROCHLOROTHIAZIDE 12.5 MG PO CAPS
12.5000 mg | ORAL_CAPSULE | Freq: Every day | ORAL | Status: DC
  Administered 2014-02-01: 12.5 mg via ORAL
  Filled 2014-02-01: qty 1

## 2014-02-01 NOTE — Progress Notes (Signed)
Patient discharged to home.  Reviewed discharge paperwork with patient, prescriptions for new medications given to patient, patient verbalized understanding.  Patient escorted off unit by NT via wheelchair at 1:30 pm.  Dorothyann PengKim Ehtan Delfavero, RN

## 2014-02-01 NOTE — Evaluation (Signed)
Physical Therapy Evaluation Patient Details Name: Thomasene RippleStephanie L Fielden MRN: [id number] DOB: 1966/05/02 Today's Date: 02/01/2014   History of Present Illness     Clinical Impression  Pt s/p ACL repair and presents with decreased functional mobility 2* decreased R LE strength, KI in place and post op pain.  Pt plans d/c home this date with family assist.  RW ordered to assist pt    Follow Up Recommendations No PT follow up    Equipment Recommendations  Rolling walker with 5" wheels    Recommendations for Other Services       Precautions / Restrictions Precautions Precautions: Fall Required Braces or Orthoses: Knee Immobilizer - Right Knee Immobilizer - Right: On when out of bed or walking Restrictions Weight Bearing Restrictions: Yes RLE Weight Bearing: Weight bearing as tolerated Other Position/Activity Restrictions: WBAT with KI on      Mobility  Bed Mobility Overal bed mobility: Needs Assistance Bed Mobility: Supine to Sit;Sit to Supine     Supine to sit: Min assist Sit to supine: Min assist   General bed mobility comments: cues for sequence and use of L LE to self assist,  MIn assist for R LE  Transfers Overall transfer level: Needs assistance Equipment used: Rolling walker (2 wheeled) Transfers: Sit to/from Stand Sit to Stand: Min guard         General transfer comment: cues for LE management and use of UEs to self assist  Ambulation/Gait Ambulation/Gait assistance: Min assist;Min guard;Supervision Ambulation Distance (Feet): 45 Feet Assistive device: Rolling walker (2 wheeled) Gait Pattern/deviations: Step-to pattern;Shuffle;Trunk flexed Gait velocity: decr   General Gait Details: Cues for sequence, posture and position from RW.  Pt progressed from initial min assist for stabilty to ambulating with RW and Sup.    Stairs Stairs: Yes Stairs assistance: Min assist Stair Management: One rail Right;Step to pattern;Forwards;With crutches Number of Stairs:  2 General stair comments: cues for sequence, foot/crutch placement  Wheelchair Mobility    Modified Rankin (Stroke Patients Only)       Balance                                             Pertinent Vitals/Pain 6/10; premed, ice pack provided    Home Living Family/patient expects to be discharged to:: Private residence Living Arrangements: Spouse/significant other Available Help at Discharge: Family Type of Home: Apartment Home Access: Stairs to enter Entrance Stairs-Rails: Right Entrance Stairs-Number of Steps: 12 Home Layout: One level Home Equipment: None      Prior Function Level of Independence: Independent               Hand Dominance        Extremity/Trunk Assessment   Upper Extremity Assessment: Overall WFL for tasks assessed           Lower Extremity Assessment: RLE deficits/detail RLE Deficits / Details: KI in place    Cervical / Trunk Assessment: Normal  Communication   Communication: No difficulties  Cognition Arousal/Alertness: Awake/alert Behavior During Therapy: WFL for tasks assessed/performed Overall Cognitive Status: Within Functional Limits for tasks assessed                      General Comments      Exercises        Assessment/Plan    PT Assessment Patient needs continued PT services  PT Diagnosis  Difficulty walking   PT Problem List Decreased activity tolerance;Decreased strength;Decreased range of motion;Pain;Obesity;Decreased mobility;Decreased knowledge of use of DME  PT Treatment Interventions DME instruction;Gait training;Stair training;Functional mobility training;Therapeutic activities;Therapeutic exercise;Patient/family education   PT Goals (Current goals can be found in the Care Plan section) Acute Rehab PT Goals Patient Stated Goal: Resume previous lifestyle  PT Goal Formulation: No goals set, d/c therapy    Frequency     Barriers to discharge        Co-evaluation                End of Session Equipment Utilized During Treatment: Gait belt;Right knee immobilizer Activity Tolerance: Patient tolerated treatment well;Patient limited by pain Patient left: in bed;with call bell/phone within reach;with family/visitor present Nurse Communication: Mobility status    Functional Assessment Tool Used: clinical judgement  Functional Limitation: Mobility: Walking and moving around Mobility: Walking and Moving Around Current Status ([id number](G8978): At least 1 percent but less than 20 percent impaired, limited or restricted Mobility: Walking and Moving Around Goal Status [phone](G8979): At least 1 percent but less than 20 percent impaired, limited or restricted Mobility: Walking and Moving Around Discharge Status [phone](G8980): At least 1 percent but less than 20 percent impaired, limited or restricted    Time: [id number] PT Time Calculation (min): 18 min   Charges:   PT Evaluation $Initial PT Evaluation Tier I: 1 Procedure     PT G Codes:   Functional Assessment Tool Used: clinical judgement  Functional Limitation: Mobility: Walking and moving around    [facility]ast[facility] Endoscopy Center LLCBRADSHAW,Lakeisha Waldrop 02/01/2014, 12:41 PM

## 2014-02-01 NOTE — Op Note (Signed)
NAMLorre [name]:  [name], [name]             ACCOUNT NO.:  [id number]  MEDICAL RECORD NO.:  [id number]  LOCATION:  1311                         [facility]  PHYSICIAN:  Erasmo Leventhalobert Andrew Tvisha Schwoerer, M.D.DATE OF BIRTH:  26-May-1966  DATE OF PROCEDURE:  01/31/2014 DATE OF DISCHARGE:                              OPERATIVE REPORT   PREOPERATIVE DIAGNOSIS:  ACL-deficient right knee, symptomatic.  POSTOPERATIVE DIAGNOSIS:  ACL-deficient right knee, symptomatic.  PROCEDURES:  Right knee arthroscopic-assisted allograft anterior cruciate ligament reconstruction.  SURGEON:  Erasmo Leventhalobert Andrew Irva Loser, M.D.  ASSISTING:  Arsenio LoaderBryson Stilwell, PA-C.  ANESTHESIA:  Regional, Dr. Rica MastFortune, general.  ESTIMATED BLOOD LOSS:  Minimal.  DRAINS:  None.  COMPLICATIONS:  None.  TOURNIQUET TIME:  70 minutes at 300 mmHg.  DISPOSITION:  PACU stable.  OPERATIVE DETAILS:  The patient was counseled in the holding area, correct site was identified.  IV was started.  Sedation was given. Anesthesia administered as per Dr. Rica MastFortune.  Correct site was marked, signed appropriately, and was taken to operating room.  IV Ancef was given, and TED hose applied on uninvolved leg.  In the OR, the patient was placed in supine position under general anesthesia.  All extremities were well padded.  Right knee had been examined, full range of motion, +5 degrees __________ positive Lachman's and pivot shift.  PCL __________ elevated, prepped with DuraPrep, draped in sterile fashion. Time-out was done, confirmed the right side, exsanguinated, Esmarch tourniquet inflated to 300 mmHg.  Arthroscopic portals were established using previous arthroscopic portal sites.  Diagnostic arthroscopy revealed ACL was deficient __________ PCL to ACL __________, notchplasty performed, PCL was protected.  Patellofemoral joint with normal tracking.  __________ there was some chondromalacia __________ splotchy, the remnants __________ intact.  Medial tibial  plateau unremarkable. Lateral side inspected. She had the remnants of the partial meniscectomy __________, but no recurrent tear.  Femoral guide was put in anatomic position, small incision made lateral, skin and subcutaneous tissue, and Arthrex FlipCutter was placed in anatomic ACL footprint and retrosocket was performed.  She was noted to have very soft bone in the femoral cortex.  At this point in time, it was also thought to be soft.  The tunnel was rasped and debrided appropriately.  Tibial guide was put into correct position.  An Arthrex FlipCutter was placed in anatomic ACL footprint and retrosocket was performed. __________ tunnel was rasped and debrided appropriate and FiberWire sutures had been placed and brought through the inferomedial portal site.  The graft was then delivered to the knee joint.  We used ACL TightRope system both sides.  On the distal lateral femur, __________ 1.5 inch incision through skin, subcutaneous tissues, pulled the button at the outside and delivered the graft to the knee joint, appropriate depth, split the IT band __________.  I then placed the graft to the tibial socket.  Cinched down appropriately on both sides and __________ soft bone and lateral __________ at this time, we used an extension button on the femoral cortex.  This was taken down to the periosteum, tightened nicely, confirmed that the IT band was on top of this visually, and then tightened down on the tibial side.  The knee was put through range of  motion.  Knee was nicely stable clinically.  Graft had excellent orientation and tension and then supplemented the tibial side, the button, the large button with a PushLock anchor.  At this point, well fixated on both sides.  Arthroscopic equipment was removed.  Knee was __________ stable.  Graft had excellent orientation and tension.  The incision closed with subcu Vicryl, skin with nylon, and portals with nylon.  10 mL of Sensorcaine  placed in the skin, 10 mL of 0.25 Sensorcaine, and 4 mL of morphine sulfate was injected in the knee joint.  Satisfied with the knee.  TED hose, ice packs, and knee brace in full extension.  She tolerated the procedure with no complications or problems.  She was awakened, and she was taken from the operating room to PACU in stable condition.  __________ during the case, an 80 size was appropriate, LifeNet Arthrex allograft was chosen, prepared on back table by Mr. Arsenio LoaderBryson Stilwell, PA-C.  To help with the patient positioning, prepping and draping, technical and surgical assistance throughout entire case, wound closure and application of dressing and splint, Mr. Arsenio LoaderBryson Stilwell, PA-C, assistance was needed.          ______________________________ Erasmo Leventhalobert Andrew Jeno Calleros, M.D.     RAC/MEDQ  D:  01/31/2014  T:  02/01/2014  Job:  [id number]

## 2014-02-01 NOTE — Progress Notes (Signed)
[name] RippleStephanie L [name]  MRN: [id number] DOB/Age: [birth year] 48 y.o. Physician: [name] BalintKevin Supple,MD Procedure: Procedure(s) (LRB): RIGHT KNEE ARTHROSCOPY WITH ALLOGRAFT (ACL) ANTERIOR CRUCIATE LIGAMENT RECONSTRUCTON PARTIAL MENISCECTOMY VERSES REPAIR  (Right)     Subjective: Patient kept overnight for pain control. According to staff she needs PT eval as not gotten out of bed yet  Vital Signs Temp:  [97.5 F (36.4 C)-98.3 F (36.8 C)] 97.9 F (36.6 C) (06/27 0500) Pulse Rate:  [62-83] 80 (06/27 0500) Resp:  [8-18] 16 (06/27 0500) BP: (123-189)/(75-108) 123/82 mmHg (06/27 0500) SpO2:  [90 %-100 %] 99 % (06/27 0500) Weight:  [82.328 kg (181 lb 8 oz)] 82.328 kg (181 lb 8 oz) (06/26 1045)  Lab Results  Recent Labs  01/31/14 1129  HGB 15.6*  HCT 46.0   BMET  Recent Labs  01/31/14 1129  NA 142  K 3.7  CL 103  GLUCOSE 103*  BUN 8  CREATININE 0.90   No results found for this basename: inr     Exam Dressing dry NVI but still subjectively numb from femoral nerve block        Plan DC home after PT eval  [name] [name] for Dr.Kevin Supple 02/01/2014, 8:55 AM

## 2014-02-01 NOTE — Care Management Note (Signed)
    Page 1 of 1   02/01/2014     11:54:54 AM CARE MANAGEMENT NOTE 02/01/2014  Patient:  Thomasene RippleWHITE,Irem L   Account Number:  [id number]  Date Initiated:  02/01/2014  Documentation initiated by:  [facility] Surg[facility] CenterJEFFRIES,Raunak Antuna  Subjective/Objective Assessment:   adm: RIGHT KNEE ARTHROSCOPY WITH ALLOGRAFT (ACL) ANTERIOR CRUCIATE LIGAMENT RECONSTRUCTON PARTIAL MENISCECTOMY VERSES REPAIR  (Right)     Action/Plan:   discharge planning   Anticipated DC Date:  02/01/2014   Anticipated DC Plan:  HOME/SELF CARE      DC Planning Services  CM consult      Choice offered to / List presented to:     DME arranged  Levan HurstWALKER - RO[facility]      DME agency  [facility]        Status of service:  Completed, signed off Medicare Important Message given?   (If response is "NO", the following Medicare IM given date fields will be blank) Date Medicare IM given:   Date Additional Medicare IM given:    Discharge Disposition:  HOME/SELF CARE  Per UR Regulation:    If discussed at Long Length of Stay Meetings, dates discussed:    Comments:  02/01/14 11:30 Cm received call from RN requesting arrangement of RW.  CM called DME rep for delivery of Rolling walker to room prior to discharge today.  No other CM needs were communicated.  Freddy JakschSarah Adonus Uselman, BSN, CM [phone].

## 2014-02-01 NOTE — Progress Notes (Signed)
UR Completed.  [name] [name] [phone] 02/01/2014  

## 2014-02-03 ENCOUNTER — Encounter (HOSPITAL_BASED_OUTPATIENT_CLINIC_OR_DEPARTMENT_OTHER): Payer: Self-pay | Admitting: Specialist

## 2014-02-04 ENCOUNTER — Ambulatory Visit: Payer: Medicaid Other | Attending: Physical Therapy | Admitting: Physical Therapy

## 2014-02-04 NOTE — Discharge Summary (Signed)
Physician Discharge Summary  Patient ID: [name] [name] MRN: [id number] DOB/AGE: 49/05/1966 49 y.o.  Admit date: 01/31/2014 Discharge date: 02/04/2014  Admission Diagnoses: ACl tear  Discharge Diagnoses:  Active Problems:   S/P ACL reconstruction   Discharged Condition: Good  Hospital Course: Patient admitted for right knee ACL reconstruction in the outpatient setting at [facility]. After the block the patient wen to the OR and had uncomplicated ACl reconstruction. Transferred to PACU where her pain was unable to be controlled. She then required overnight observation for pain management. On POD #1Patient met D/C home criteria.  She was then Discharged home with family support in stable condition.   Consults: N/a  Significant Diagnostic Studies: N/a  Treatments: Routine ACL  Discharge Exam: Blood pressure 134/81, pulse 74, temperature 98.3 F (36.8 C), temperature source Oral, resp. rate 18, height _0  (1.626 m), weight 82.328 kg (181 lb 8 oz), SpO2 97.00%. Well nourished. Alert and oriented x3. RRR, Lungs clear, BS x4. Abdomen soft and non tender. Right Calf soft and non tender. Right knee dressing C/D/I. No DVT signs. Compartment soft. No signs of infection.  Right LE neurovascular intact.  Disposition: 01-Home or Self Care  Discharge Instructions   Call MD / Call 911    Complete by:  As directed   If you experience chest pain or shortness of breath, CALL 911 and be transported to the hospital emergency room.  If you develope a fever above 101 F, pus (Radwan drainage) or increased drainage or redness at the wound, or calf pain, call your surgeon's office.     Constipation Prevention    Complete by:  As directed   Drink plenty of fluids.  Prune juice may be helpful.  You may use a stool softener, such as Colace (over the counter) 100 mg twice a day.  Use MiraLax (over the counter) for constipation as needed.     Diet - low sodium heart healthy    Complete by:  As directed      Discharge instructions    Complete by:  As directed   DISCHARGE INSTRUCTIONS: ________________________________________________________________________________  ACL RECONSTRUCTION HOME EXERCISE PROGRAM (0-2 WEEKS)         Elevate the leg above your heart as often as possible. Weight bear as tolerated with the immobilizer.  Use crutches as needed, progress from 2 to 1 crutch as able using one crutch on opposite side of surgical knee.  Do not limp and do not walk too much!! Wear immobilizer all the time except when exercising.  Wear immobilizer at night!! Start normal showering according to your surgeon's instructions. Goals for first two weeks:  minimal swelling, motion 0-90, walking with immobilizer without crutches and positive attitude about PT. Exercise program to be performed as soon as as able 3-4 times per day followed by ice pack or ice bag for 20 minutes. Sitting over edge of bed or counter - Passive Range of Motion using opposite leg to bend and straighten knee as much as possible (5-10 minutes) Thigh tensing exercise - Push the knee into a towel roll and try to raise heel slightly off floor.  Hold for 8 seconds, rest 10 seconds. (15 times every hour) Straight leg raise lying on your back with opposite knee bent, keeping surgical knee as straight as possible.  You may do with knee immobilizer initially (3-5 sets of ten) Hamstring tensing - Dig heels into floor as if you were attempting to bend knee.  Do not allow knee to bend.  Hold for 8 seconds, rest 10 seconds Towel stretch - Towel around ball of foot stretching calf by pulling the ankle back while gradually leaning forward to stretch the back of the knee and thigh.  (Hold 20 seconds, 4 times each) Back of knee stretch - While lying on stomach, knee just over edge of bed (hold 2 minutes, 4 times) Use pain medication as needed.  To prevent constipation use Colace 138m. twice a day while on pain medication.  If constipated, use  Miralax 17 gm once a day and drink plenty of fluids.  These medications can be obtained at the pharmacy without a prescription.   Follow up in the office in 3 days. Leave dressing in place and reinforce as needed.     Increase activity slowly as tolerated    Complete by:  As directed             Medication List         acetaminophen 500 MG tablet  Commonly known as:  TYLENOL  Take 2 tablets (1,000 mg total) by mouth 2 (two) times daily.     albuterol 108 (90 BASE) MCG/ACT inhaler  Commonly known as:  PROVENTIL HFA;VENTOLIN HFA  Inhale 2 puffs into the lungs every 6 (six) hours as needed for wheezing or shortness of breath.     ALKA-SELTZER ANTACID PO  Take by mouth as needed.     amLODipine 2.5 MG tablet  Commonly known as:  NORVASC  Take 2.5 mg by mouth every morning.     aspirin EC 325 MG tablet  Take 1 tablet (325 mg total) by mouth 2 (two) times daily.     cephALEXin 500 MG capsule  Commonly known as:  KEFLEX  Take 1 capsule (500 mg total) by mouth 3 (three) times daily.     diazepam 5 MG tablet  Commonly known as:  VALIUM  Take 1 tablet (5 mg total) by mouth every 8 (eight) hours as needed for muscle spasms.     gabapentin 300 MG capsule  Commonly known as:  NEURONTIN  Take 300 mg by mouth 2 (two) times daily.     HYDROmorphone 2 MG tablet  Commonly known as:  DILAUDID  Take 1 tablet (2 mg total) by mouth every 4 (four) hours as needed for severe pain.     ketoprofen 75 MG capsule  Commonly known as:  ORUDIS  Take 75 mg by mouth every 8 (eight) hours as needed (pain).     lisinopril-hydrochlorothiazide 20-12.5 MG per tablet  Commonly known as:  PRINZIDE,ZESTORETIC  Take 1 tablet by mouth every morning.     methocarbamol 500 MG tablet  Commonly known as:  ROBAXIN  Take 1 tablet (500 mg total) by mouth every 6 (six) hours as needed for muscle spasms.     pravastatin 40 MG tablet  Commonly known as:  PRAVACHOL  Take 40 mg by mouth every morning.      QUEtiapine 100 MG tablet  Commonly known as:  SEROQUEL  Take 100 mg by mouth at bedtime.     tiZANidine 4 MG tablet  Commonly known as:  ZANAFLEX  Take 4 mg by mouth every 8 (eight) hours as needed for muscle spasms.           Follow-up Information   Follow up with [facility] (rolling walk[facility])    Contact information:   [address] [phone]  SignedLajean Manes 02/04/2014, 7:23 AM

## 2014-02-10 ENCOUNTER — Ambulatory Visit: Payer: Medicaid Other | Attending: Specialist

## 2014-02-10 DIAGNOSIS — M25669 Stiffness of unspecified knee, not elsewhere classified: Secondary | ICD-10-CM | POA: Insufficient documentation

## 2014-02-10 DIAGNOSIS — Z9889 Other specified postprocedural states: Secondary | ICD-10-CM | POA: Insufficient documentation

## 2014-02-10 DIAGNOSIS — IMO0001 Reserved for inherently not codable concepts without codable children: Secondary | ICD-10-CM | POA: Insufficient documentation

## 2014-02-10 DIAGNOSIS — M25569 Pain in unspecified knee: Secondary | ICD-10-CM | POA: Insufficient documentation

## 2014-02-13 ENCOUNTER — Ambulatory Visit: Payer: Medicaid Other | Admitting: Physical Therapy

## 2014-02-13 DIAGNOSIS — Z9889 Other specified postprocedural states: Secondary | ICD-10-CM | POA: Diagnosis not present

## 2014-02-13 DIAGNOSIS — M25569 Pain in unspecified knee: Secondary | ICD-10-CM | POA: Diagnosis not present

## 2014-02-13 DIAGNOSIS — M25669 Stiffness of unspecified knee, not elsewhere classified: Secondary | ICD-10-CM | POA: Diagnosis not present

## 2014-02-13 DIAGNOSIS — IMO0001 Reserved for inherently not codable concepts without codable children: Secondary | ICD-10-CM | POA: Diagnosis not present

## 2014-02-20 ENCOUNTER — Ambulatory Visit: Payer: Medicaid Other | Admitting: Physical Therapy

## 2014-02-28 ENCOUNTER — Ambulatory Visit: Payer: Medicaid Other | Admitting: Physical Therapy

## 2014-03-20 ENCOUNTER — Ambulatory Visit: Payer: Medicaid Other | Attending: Specialist | Admitting: Physical Therapy

## 2014-03-20 DIAGNOSIS — M25569 Pain in unspecified knee: Secondary | ICD-10-CM | POA: Insufficient documentation

## 2014-03-20 DIAGNOSIS — Z9889 Other specified postprocedural states: Secondary | ICD-10-CM | POA: Insufficient documentation

## 2014-03-20 DIAGNOSIS — M25669 Stiffness of unspecified knee, not elsewhere classified: Secondary | ICD-10-CM | POA: Insufficient documentation

## 2014-03-20 DIAGNOSIS — IMO0001 Reserved for inherently not codable concepts without codable children: Secondary | ICD-10-CM | POA: Insufficient documentation

## 2014-04-03 ENCOUNTER — Encounter (HOSPITAL_COMMUNITY): Payer: Self-pay | Admitting: Emergency Medicine

## 2014-04-03 ENCOUNTER — Emergency Department (HOSPITAL_COMMUNITY)
Admission: EM | Admit: 2014-04-03 | Discharge: 2014-04-03 | Disposition: A | Discharge status: Home / Self Care | Payer: Medicaid Other | Attending: Emergency Medicine | Admitting: Emergency Medicine

## 2014-04-03 DIAGNOSIS — K219 Gastro-esophageal reflux disease without esophagitis: Secondary | ICD-10-CM | POA: Diagnosis not present

## 2014-04-03 DIAGNOSIS — G43909 Migraine, unspecified, not intractable, without status migrainosus: Secondary | ICD-10-CM | POA: Diagnosis not present

## 2014-04-03 DIAGNOSIS — R52 Pain, unspecified: Secondary | ICD-10-CM | POA: Insufficient documentation

## 2014-04-03 DIAGNOSIS — Z79899 Other long term (current) drug therapy: Secondary | ICD-10-CM | POA: Diagnosis not present

## 2014-04-03 DIAGNOSIS — G8929 Other chronic pain: Secondary | ICD-10-CM | POA: Insufficient documentation

## 2014-04-03 DIAGNOSIS — R110 Nausea: Secondary | ICD-10-CM | POA: Diagnosis not present

## 2014-04-03 DIAGNOSIS — J45909 Unspecified asthma, uncomplicated: Secondary | ICD-10-CM | POA: Insufficient documentation

## 2014-04-03 DIAGNOSIS — M5430 Sciatica, unspecified side: Secondary | ICD-10-CM | POA: Insufficient documentation

## 2014-04-03 DIAGNOSIS — Z7982 Long term (current) use of aspirin: Secondary | ICD-10-CM | POA: Diagnosis not present

## 2014-04-03 DIAGNOSIS — F17200 Nicotine dependence, unspecified, uncomplicated: Secondary | ICD-10-CM | POA: Diagnosis not present

## 2014-04-03 DIAGNOSIS — F319 Bipolar disorder, unspecified: Secondary | ICD-10-CM | POA: Diagnosis not present

## 2014-04-03 DIAGNOSIS — J020 Streptococcal pharyngitis: Secondary | ICD-10-CM | POA: Diagnosis not present

## 2014-04-03 DIAGNOSIS — M1990 Unspecified osteoarthritis, unspecified site: Secondary | ICD-10-CM | POA: Insufficient documentation

## 2014-04-03 DIAGNOSIS — E785 Hyperlipidemia, unspecified: Secondary | ICD-10-CM | POA: Diagnosis not present

## 2014-04-03 DIAGNOSIS — J029 Acute pharyngitis, unspecified: Secondary | ICD-10-CM | POA: Insufficient documentation

## 2014-04-03 DIAGNOSIS — M25569 Pain in unspecified knee: Secondary | ICD-10-CM | POA: Insufficient documentation

## 2014-04-03 DIAGNOSIS — I10 Essential (primary) hypertension: Secondary | ICD-10-CM | POA: Diagnosis not present

## 2014-04-03 LAB — RAPID STREP SCREEN (MED CTR MEBANE ONLY): Streptococcus, Group A Screen (Direct): POSITIVE — AB

## 2014-04-03 MED ORDER — SODIUM CHLORIDE 0.9 % IV BOLUS (SEPSIS)
1000.0000 mL | Freq: Once | INTRAVENOUS | Status: AC
  Administered 2014-04-03: 1000 mL via INTRAVENOUS

## 2014-04-03 MED ORDER — PENICILLIN G BENZATHINE 1200000 UNIT/2ML IM SUSP: 2.4000 10*6.[IU] | Freq: Once | INTRAMUSCULAR | Status: DC

## 2014-04-03 MED ORDER — ONDANSETRON HCL 4 MG/2ML IJ SOLN
4.0000 mg | Freq: Once | INTRAMUSCULAR | Status: AC
  Administered 2014-04-03: 4 mg via INTRAVENOUS
  Filled 2014-04-03: qty 2

## 2014-04-03 MED ORDER — MORPHINE SULFATE 4 MG/ML IJ SOLN
4.0000 mg | Freq: Once | INTRAMUSCULAR | Status: AC
  Administered 2014-04-03: 4 mg via INTRAVENOUS
  Filled 2014-04-03: qty 1

## 2014-04-03 MED ORDER — PENICILLIN V POTASSIUM 500 MG PO TABS
500.0000 mg | ORAL_TABLET | Freq: Three times a day (TID) | ORAL | Status: AC
Start: 2014-04-03 — End: 2014-04-10

## 2014-04-03 NOTE — ED Notes (Addendum)
Pt reports since Tuesday sore throat, ear pain, and body aches. Pain 7/10. Decreased appetite. Diarrhea present. Denies n/v. Denies dysuria.

## 2014-04-03 NOTE — ED Provider Notes (Signed)
CSN: [id number]     Arrival date & time 04/03/14  1121 History   First MD Initiated Contact with Patient 04/03/14 1133     Chief Complaint  Patient presents with  . Sore Throat  . Generalized Body Aches     (Consider location/radiation/quality/duration/timing/severity/associated sxs/prior Treatment) HPI Comments: 48 year old female with history of depression, sinusitis, allergic rhinitis, headache, anterior cruciate ligament reconstruction last month presents with congestion, gradual onset headache frontal, bodyaches and sore throat for the past 2 days. Patient has had this once the past. No sick contacts or current antibiotics. Patient is nausea and decreased by mouth intake today. Patient generally feels unwell. No leg swelling or shortness of breath. Patient still has pain in the right knee since surgery however has more mobility.  Patient is a 48 y.o. female presenting with pharyngitis. The history is provided by the patient.  Sore Throat Associated symptoms include headaches. Pertinent negatives include no abdominal pain and no shortness of breath.    Past Medical History  Diagnosis Date  . Hypertension   . Migraine   . DDD (degenerative disc disease)   . Sciatica   . Osteoarthritis   . Asthma   . GERD (gastroesophageal reflux disease)   . Right ACL tear   . Right knee meniscal tear   . Bipolar 1 disorder   . Depression   . Hyperlipidemia   . Chronic lumbar pain   . History of gastric ulcer   . Wears glasses    Past Surgical History  Procedure Laterality Date  . Bunionectomy Left 2011  . Heel spur surgery Right 2012  . Total abdominal hysterectomy w/ bilateral salpingoophorectomy  04-20-2006  . Knee arthroscopy with anterior cruciate ligament (acl) repair with hamstring graft Right 01/31/2014    Procedure: RIGHT KNEE ARTHROSCOPY WITH ALLOGRAFT (ACL) ANTERIOR CRUCIATE LIGAMENT RECONSTRUCTON PARTIAL MENISCECTOMY VERSES REPAIR ;  Surgeon: Eugenia Mcalpine, MD;  Location:  [facility] [place];  Service: Orthopedics;  Laterality: Right;   Family History  Problem Relation Age of Onset  . Hypertension Father    History  Substance Use Topics  . Smoking status: Current Every Day Smoker -- 0.50 packs/day for 32 years    Types: Cigarettes  . Smokeless tobacco: Never Used  . Alcohol Use: 8.4 oz/week    14 Cans of beer per week     Comment: 2 beer daily--  (hx alcohol abuse)   OB History   Grav Para Term Preterm Abortions TAB SAB Ect Mult Living                 Review of Systems  Constitutional: Positive for chills and appetite change. Negative for fever.  HENT: Positive for congestion.   Respiratory: Positive for cough. Negative for shortness of breath.   Gastrointestinal: Positive for nausea. Negative for vomiting and abdominal pain.  Genitourinary: Negative for dysuria.  Musculoskeletal: Negative for back pain.  Skin: Negative for rash.  Neurological: Positive for light-headedness and headaches.      Allergies  Diphenhydramine hcl  Home Medications   Prior to Admission medications   Medication Sig Start Date End Date Taking? Authorizing Provider  acetaminophen (TYLENOL) 500 MG tablet Take 2 tablets (1,000 mg total) by mouth 2 (two) times daily. 01/31/14   Bryson L Stilwell, PA-C  albuterol (PROVENTIL HFA;VENTOLIN HFA) 108 (90 BASE) MCG/ACT inhaler Inhale 2 puffs into the lungs every 6 (six) hours as needed for wheezing or shortness of breath.    Historical Provider, MD  amLODipine (NORVASC)  2.5 MG tablet Take 2.5 mg by mouth every morning.     Historical Provider, MD  aspirin EC 325 MG tablet Take 1 tablet (325 mg total) by mouth 2 (two) times daily. 01/31/14   Bryson L Stilwell, PA-C  Calcium Carbonate Antacid (ALKA-SELTZER ANTACID PO) Take by mouth as needed.    Historical Provider, MD  cephALEXin (KEFLEX) 500 MG capsule Take 1 capsule (500 mg total) by mouth 3 (three) times daily. 01/31/14   Bryson L Stilwell, PA-C  diazepam (VALIUM) 5 MG  tablet Take 1 tablet (5 mg total) by mouth every 8 (eight) hours as needed for muscle spasms. 01/06/14   Tatyana A Kirichenko, PA-C  gabapentin (NEURONTIN) 300 MG capsule Take 300 mg by mouth 2 (two) times daily.    Historical Provider, MD  HYDROmorphone (DILAUDID) 2 MG tablet Take 1 tablet (2 mg total) by mouth every 4 (four) hours as needed for severe pain. 01/31/14   Bryson L Stilwell, PA-C  ketoprofen (ORUDIS) 75 MG capsule Take 75 mg by mouth every 8 (eight) hours as needed (pain).     Historical Provider, MD  lisinopril-hydrochlorothiazide (PRINZIDE,ZESTORETIC) 20-12.5 MG per tablet Take 1 tablet by mouth every morning.     Historical Provider, MD  methocarbamol (ROBAXIN) 500 MG tablet Take 1 tablet (500 mg total) by mouth every 6 (six) hours as needed for muscle spasms. 01/31/14   Bryson L Stilwell, PA-C  pravastatin (PRAVACHOL) 40 MG tablet Take 40 mg by mouth every morning.     Historical Provider, MD  QUEtiapine (SEROQUEL) 100 MG tablet Take 100 mg by mouth at bedtime.    Historical Provider, MD  tiZANidine (ZANAFLEX) 4 MG tablet Take 4 mg by mouth every 8 (eight) hours as needed for muscle spasms.    Historical Provider, MD   BP 130/90  Pulse 76  Temp(Src) 98.7 F (37.1 C) (Oral)  Resp 22  SpO2 99% Physical Exam  Nursing note and vitals reviewed. Constitutional: She is oriented to person, place, and time. She appears well-developed and well-nourished.  HENT:  Head: Normocephalic and atraumatic.  Mild dry mucous membranes No trismus, uvular deviation, unilateral posterior pharyngeal edema or submandibular swelling. Mild posterior erythema  Eyes: Conjunctivae are normal. Right eye exhibits no discharge. Left eye exhibits no discharge.  Neck: Normal range of motion. Neck supple. No tracheal deviation present.  Cardiovascular: Normal rate and regular rhythm.   Pulmonary/Chest: Effort normal and breath sounds normal.  Abdominal: Soft. She exhibits no distension. There is no tenderness.  There is no guarding.  Musculoskeletal: She exhibits tenderness.  Mild tenderness right knee with flexion, mild swelling, no warmth, no calf tenderness, no signs of infection  Neurological: She is alert and oriented to person, place, and time.  Skin: Skin is warm. No rash noted.  Psychiatric: She has a normal mood and affect.    ED Course  Procedures (including critical care time) Labs Review Labs Reviewed  RAPID STREP SCREEN - Abnormal; Notable for the following:    Streptococcus, Group A Screen (Direct) POSITIVE (*)    All other components within normal limits    Imaging Review No results found.   EKG Interpretation None      MDM   Final diagnoses:  Acute streptococcal pharyngitis   Clinically patient with viral syndrome with congestion and sore throat. Patient not tolerating oral, plan for peripheral IV, fluid bolus, pain meds and nausea medicines. Discussed close followup and reasons to return. No signs of meningitis in the ER no  concerning rashes.  Patient improved on recheck. IV fluids given and strep test positive. I recommended Bicillin however patient declined and wants oral pills. Plan for antibiotics and followup outpatient.  Results and differential diagnosis were discussed with the patient/parent/guardian. Close follow up outpatient was discussed, comfortable with the plan.   Medications  ondansetron (ZOFRAN) injection 4 mg (not administered)  sodium chloride 0.9 % bolus 1,000 mL (not administered)  morphine 4 MG/ML injection 4 mg (not administered)    Filed Vitals:   04/03/14 1126  BP: 130/90  Pulse: 76  Temp: 98.7 F (37.1 C)  TempSrc: Oral  Resp: 22  SpO2: 99%         Enid Skeens, MD 04/03/14 1307

## 2014-04-03 NOTE — Discharge Instructions (Signed)
If you were given medicines take as directed.  If you are on coumadin or contraceptives realize their levels and effectiveness is altered by many different medicines.  If you have any reaction (rash, tongues swelling, other) to the medicines stop taking and see a physician.   Please follow up as directed and return to the ER or see a physician for new or worsening symptoms.  Thank you. Filed Vitals:   04/03/14 1126  BP: 130/90  Pulse: 76  Temp: 98.7 F (37.1 C)  TempSrc: Oral  Resp: 22  SpO2: 99%    Pharyngitis Pharyngitis is a sore throat (pharynx). There is redness, pain, and swelling of your throat. HOME CARE   Drink enough fluids to keep your pee (urine) clear or pale yellow.  Only take medicine as told by your doctor.  You may get sick again if you do not take medicine as told. Finish your medicines, even if you start to feel better.  Do not take aspirin.  Rest.  Rinse your mouth (gargle) with salt water ( tsp of salt per 1 qt of water) every 1-2 hours. This will help the pain.  If you are not at risk for choking, you can suck on hard candy or sore throat lozenges. GET HELP IF:  You have large, tender lumps on your neck.  You have a rash.  You cough up green, yellow-brown, or bloody spit. GET HELP RIGHT AWAY IF:   You have a stiff neck.  You drool or cannot swallow liquids.  You throw up (vomit) or are not able to keep medicine or liquids down.  You have very bad pain that does not go away with medicine.  You have problems breathing (not from a stuffy nose). MAKE SURE YOU:   Understand these instructions.  Will watch your condition.  Will get help right away if you are not doing well or get worse. Document Released: 01/11/2008 Document Revised: 05/15/2013 Document Reviewed: 04/01/2013 [facility] Patient Information 2015 [place], [place]. This information is not intended to replace advice given to you by your health care provider. Make sure you discuss any  questions you have with your health care provider.

## 2014-07-15 ENCOUNTER — Encounter (HOSPITAL_COMMUNITY): Payer: Self-pay | Admitting: Emergency Medicine

## 2014-07-15 ENCOUNTER — Emergency Department (HOSPITAL_COMMUNITY): Payer: Medicaid Other

## 2014-07-15 ENCOUNTER — Emergency Department (HOSPITAL_COMMUNITY)
Admission: EM | Admit: 2014-07-15 | Discharge: 2014-07-15 | Disposition: A | Discharge status: Home / Self Care | Payer: Medicaid Other | Attending: Emergency Medicine | Admitting: Emergency Medicine

## 2014-07-15 DIAGNOSIS — F319 Bipolar disorder, unspecified: Secondary | ICD-10-CM | POA: Diagnosis not present

## 2014-07-15 DIAGNOSIS — G43909 Migraine, unspecified, not intractable, without status migrainosus: Secondary | ICD-10-CM | POA: Diagnosis not present

## 2014-07-15 DIAGNOSIS — M79605 Pain in left leg: Secondary | ICD-10-CM

## 2014-07-15 DIAGNOSIS — Z720 Tobacco use: Secondary | ICD-10-CM | POA: Diagnosis not present

## 2014-07-15 DIAGNOSIS — J45909 Unspecified asthma, uncomplicated: Secondary | ICD-10-CM | POA: Insufficient documentation

## 2014-07-15 DIAGNOSIS — E785 Hyperlipidemia, unspecified: Secondary | ICD-10-CM | POA: Diagnosis not present

## 2014-07-15 DIAGNOSIS — Y998 Other external cause status: Secondary | ICD-10-CM | POA: Diagnosis not present

## 2014-07-15 DIAGNOSIS — S79922A Unspecified injury of left thigh, initial encounter: Secondary | ICD-10-CM | POA: Insufficient documentation

## 2014-07-15 DIAGNOSIS — W1839XA Other fall on same level, initial encounter: Secondary | ICD-10-CM | POA: Diagnosis not present

## 2014-07-15 DIAGNOSIS — Z7982 Long term (current) use of aspirin: Secondary | ICD-10-CM | POA: Insufficient documentation

## 2014-07-15 DIAGNOSIS — Z79899 Other long term (current) drug therapy: Secondary | ICD-10-CM | POA: Diagnosis not present

## 2014-07-15 DIAGNOSIS — W19XXXA Unspecified fall, initial encounter: Secondary | ICD-10-CM

## 2014-07-15 DIAGNOSIS — I10 Essential (primary) hypertension: Secondary | ICD-10-CM | POA: Diagnosis not present

## 2014-07-15 DIAGNOSIS — M1990 Unspecified osteoarthritis, unspecified site: Secondary | ICD-10-CM | POA: Insufficient documentation

## 2014-07-15 DIAGNOSIS — K219 Gastro-esophageal reflux disease without esophagitis: Secondary | ICD-10-CM | POA: Insufficient documentation

## 2014-07-15 DIAGNOSIS — Y9289 Other specified places as the place of occurrence of the external cause: Secondary | ICD-10-CM | POA: Insufficient documentation

## 2014-07-15 DIAGNOSIS — Y9389 Activity, other specified: Secondary | ICD-10-CM | POA: Diagnosis not present

## 2014-07-15 DIAGNOSIS — M5416 Radiculopathy, lumbar region: Secondary | ICD-10-CM | POA: Diagnosis not present

## 2014-07-15 DIAGNOSIS — G8929 Other chronic pain: Secondary | ICD-10-CM | POA: Insufficient documentation

## 2014-07-15 DIAGNOSIS — Z3202 Encounter for pregnancy test, result negative: Secondary | ICD-10-CM | POA: Diagnosis not present

## 2014-07-15 LAB — POC URINE PREG, ED: Preg Test, Ur: NEGATIVE

## 2014-07-15 MED ORDER — OXYCODONE-ACETAMINOPHEN 5-325 MG PO TABS
1.0000 | ORAL_TABLET | Freq: Once | ORAL | Status: AC
  Administered 2014-07-15: 1 via ORAL
  Filled 2014-07-15: qty 1

## 2014-07-15 MED ORDER — PREDNISONE 20 MG PO TABS: ORAL_TABLET | ORAL | Status: DC

## 2014-07-15 MED ORDER — OXYCODONE-ACETAMINOPHEN 5-325 MG PO TABS: 1.0000 | ORAL_TABLET | Freq: Three times a day (TID) | ORAL | Status: DC | PRN

## 2014-07-15 NOTE — Discharge Instructions (Signed)
Please call your doctor for a followup appointment within 24-48 hours. When you talk to your doctor please let them know that you were seen in the emergency department and have them acquire all of your records so that they can discuss the findings with you and formulate a treatment plan to fully care for your new and ongoing problems. Please call and set up an appointment with your primary care provider to be seen and reassessed Please call and set up an appointment with orthopedics Please rest and stay hydrated Please apply warm compressions and massage Please avoid any physical or strenuous activity Please take medications as prescribed - while on pain medications there is to be no drinking alcohol, driving, operating any heavy machinery. If extra please dispose in a proper manner. Please do not take any extra Tylenol with this medication for this can lead to Tylenol overdose and liver issues.  Please continue to monitor symptoms closely and if symptoms are to worsen or change (fever greater than 101, chills, sweating, nausea, vomiting, chest pain, shortness of breathe, difficulty breathing, weakness, numbness, tingling, worsening or changes to pain pattern, fall, numbness, tingling, inability to control urine or bowel movements, headache, dizziness, loss of sensation) please report back to the Emergency Department immediately.   Sciatica Sciatica is pain, weakness, numbness, or tingling along the path of the sciatic nerve. The nerve starts in the lower back and runs down the back of each leg. The nerve controls the muscles in the lower leg and in the back of the knee, while also providing sensation to the back of the thigh, lower leg, and the sole of your foot. Sciatica is a symptom of another medical condition. For instance, nerve damage or certain conditions, such as a herniated disk or bone spur on the spine, pinch or put pressure on the sciatic nerve. This causes the pain, weakness, or other  sensations normally associated with sciatica. Generally, sciatica only affects one side of the body. CAUSES   Herniated or slipped disc.  Degenerative disk disease.  A pain disorder involving the narrow muscle in the buttocks (piriformis syndrome).  Pelvic injury or fracture.  Pregnancy.  Tumor (rare). SYMPTOMS  Symptoms can vary from mild to very severe. The symptoms usually travel from the low back to the buttocks and down the back of the leg. Symptoms can include:  Mild tingling or dull aches in the lower back, leg, or hip.  Numbness in the back of the calf or sole of the foot.  Burning sensations in the lower back, leg, or hip.  Sharp pains in the lower back, leg, or hip.  Leg weakness.  Severe back pain inhibiting movement. These symptoms may get worse with coughing, sneezing, laughing, or prolonged sitting or standing. Also, being overweight may worsen symptoms. DIAGNOSIS  Your caregiver will perform a physical exam to look for common symptoms of sciatica. He or she may ask you to do certain movements or activities that would trigger sciatic nerve pain. Other tests may be performed to find the cause of the sciatica. These may include:  Blood tests.  X-rays.  Imaging tests, such as an MRI or CT scan. TREATMENT  Treatment is directed at the cause of the sciatic pain. Sometimes, treatment is not necessary and the pain and discomfort goes away on its own. If treatment is needed, your caregiver may suggest:  Over-the-counter medicines to relieve pain.  Prescription medicines, such as anti-inflammatory medicine, muscle relaxants, or narcotics.  Applying heat or ice to  the painful area.  Steroid injections to lessen pain, irritation, and inflammation around the nerve.  Reducing activity during periods of pain.  Exercising and stretching to strengthen your abdomen and improve flexibility of your spine. Your caregiver may suggest losing weight if the extra weight makes  the back pain worse.  Physical therapy.  Surgery to eliminate what is pressing or pinching the nerve, such as a bone spur or part of a herniated disk. HOME CARE INSTRUCTIONS   Only take over-the-counter or prescription medicines for pain or discomfort as directed by your caregiver.  Apply ice to the affected area for 20 minutes, 3-4 times a day for the first 48-72 hours. Then try heat in the same way.  Exercise, stretch, or perform your usual activities if these do not aggravate your pain.  Attend physical therapy sessions as directed by your caregiver.  Keep all follow-up appointments as directed by your caregiver.  Do not wear high heels or shoes that do not provide proper support.  Check your mattress to see if it is too soft. A firm mattress may lessen your pain and discomfort. SEEK IMMEDIATE MEDICAL CARE IF:   You lose control of your bowel or bladder (incontinence).  You have increasing weakness in the lower back, pelvis, buttocks, or legs.  You have redness or swelling of your back.  You have a burning sensation when you urinate.  You have pain that gets worse when you lie down or awakens you at night.  Your pain is worse than you have experienced in the past.  Your pain is lasting longer than 4 weeks.  You are suddenly losing weight without reason. MAKE SURE YOU:  Understand these instructions.  Will watch your condition.  Will get help right away if you are not doing well or get worse. Document Released: 07/19/2001 Document Revised: 01/24/2012 Document Reviewed: 12/04/2011 [facility] Patient Information 2015 [place]. This information is not intended to replace advice given to you by your health care provider. Make sure you discuss any questions you have with your health care provider.

## 2014-07-15 NOTE — ED Notes (Addendum)
Toileting was offered and pt. is unable to use the restroom at this time, but is aware that we need a urine specimen.
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
## 2014-11-28 ENCOUNTER — Encounter (HOSPITAL_COMMUNITY): Payer: Self-pay | Admitting: Emergency Medicine

## 2014-11-28 ENCOUNTER — Emergency Department (HOSPITAL_COMMUNITY)
Admission: EM | Admit: 2014-11-28 | Discharge: 2014-11-28 | Disposition: A | Discharge status: Home / Self Care | Payer: Medicaid Other | Attending: Emergency Medicine | Admitting: Emergency Medicine

## 2014-11-28 DIAGNOSIS — G8929 Other chronic pain: Secondary | ICD-10-CM | POA: Diagnosis not present

## 2014-11-28 DIAGNOSIS — Z7982 Long term (current) use of aspirin: Secondary | ICD-10-CM | POA: Diagnosis not present

## 2014-11-28 DIAGNOSIS — K219 Gastro-esophageal reflux disease without esophagitis: Secondary | ICD-10-CM | POA: Diagnosis not present

## 2014-11-28 DIAGNOSIS — Z79899 Other long term (current) drug therapy: Secondary | ICD-10-CM | POA: Diagnosis not present

## 2014-11-28 DIAGNOSIS — M549 Dorsalgia, unspecified: Secondary | ICD-10-CM

## 2014-11-28 DIAGNOSIS — Z720 Tobacco use: Secondary | ICD-10-CM | POA: Insufficient documentation

## 2014-11-28 DIAGNOSIS — M545 Low back pain: Secondary | ICD-10-CM | POA: Insufficient documentation

## 2014-11-28 DIAGNOSIS — J45909 Unspecified asthma, uncomplicated: Secondary | ICD-10-CM | POA: Insufficient documentation

## 2014-11-28 DIAGNOSIS — Z87828 Personal history of other (healed) physical injury and trauma: Secondary | ICD-10-CM | POA: Diagnosis not present

## 2014-11-28 DIAGNOSIS — M1990 Unspecified osteoarthritis, unspecified site: Secondary | ICD-10-CM | POA: Insufficient documentation

## 2014-11-28 DIAGNOSIS — E785 Hyperlipidemia, unspecified: Secondary | ICD-10-CM | POA: Diagnosis not present

## 2014-11-28 DIAGNOSIS — F319 Bipolar disorder, unspecified: Secondary | ICD-10-CM | POA: Diagnosis not present

## 2014-11-28 DIAGNOSIS — G43909 Migraine, unspecified, not intractable, without status migrainosus: Secondary | ICD-10-CM | POA: Diagnosis not present

## 2014-11-28 MED ORDER — KETOROLAC TROMETHAMINE 30 MG/ML IJ SOLN
60.0000 mg | Freq: Once | INTRAMUSCULAR | Status: AC
  Administered 2014-11-28: 30 mg via INTRAMUSCULAR
  Filled 2014-11-28: qty 2

## 2014-11-28 MED ORDER — METHOCARBAMOL 750 MG PO TABS: 750.0000 mg | ORAL_TABLET | Freq: Four times a day (QID) | ORAL | Status: DC | PRN

## 2014-11-28 MED ORDER — IBUPROFEN 800 MG PO TABS: 800.0000 mg | ORAL_TABLET | Freq: Three times a day (TID) | ORAL | Status: DC | PRN

## 2014-11-28 NOTE — ED Provider Notes (Signed)
CSN: [id number]     Arrival date & time 11/28/14  1225 History   None    Chief Complaint  Patient presents with  . Back Pain   Patient is a 49 y.o. female presenting with back pain. The history is provided by the patient. No language interpreter was used.  Back Pain Associated symptoms: no abdominal pain, no dysuria, no fever, no numbness and no weakness    This chart was scribed for non-physician practitioner Elpidio Anis, PA-C, working with Rolan Bucco, MD, by Andrew Au, ED Scribe. This patient was seen in room WTR5/WTR5 and the patient's care was started at 12:35 PM.  Thomasene Ripple is a 49 y.o. female with hx of spinal stenosis, DDD, and arthritis who presents to the Emergency Department complaining of chronic low back pain that worsened 1 day ago. Pt reports back pain for the past 9 years. She describes her back pain as non radiating pressure that goes across her lower back that she rates 10/10. States this feels exactly like a recurrence of her typical back pain. Pt admits to doing some heavy lifting yesterday but reports this is normal for her. She reports worsening pain with certain movements including bending forward and with walking. Denies fevers, chills, abdominal pain, loss of control of bowel or bladder, weakness of numbness of the extremities, saddle anesthesia, bowel, urinary, or vaginal complaints.    Past Medical History  Diagnosis Date  . Hypertension   . Migraine   . DDD (degenerative disc disease)   . Sciatica   . Osteoarthritis   . Asthma   . GERD (gastroesophageal reflux disease)   . Right ACL tear   . Right knee meniscal tear   . Bipolar 1 disorder   . Depression   . Hyperlipidemia   . Chronic lumbar pain   . History of gastric ulcer   . Wears glasses    Past Surgical History  Procedure Laterality Date  . Bunionectomy Left 2011  . Heel spur surgery Right 2012  . Total abdominal hysterectomy w/ bilateral salpingoophorectomy  04-20-2006  . Knee  arthroscopy with anterior cruciate ligament (acl) repair with hamstring graft Right 01/31/2014    Procedure: RIGHT KNEE ARTHROSCOPY WITH ALLOGRAFT (ACL) ANTERIOR CRUCIATE LIGAMENT RECONSTRUCTON PARTIAL MENISCECTOMY VERSES REPAIR ;  Surgeon: Eugenia Mcalpine, MD;  Location: [facility];  Service: Orthopedics;  Laterality: Right;   Family History  Problem Relation Age of Onset  . Hypertension Father    History  Substance Use Topics  . Smoking status: Current Every Day Smoker -- 0.50 packs/day for 32 years    Types: Cigarettes  . Smokeless tobacco: Never Used  . Alcohol Use: 8.4 oz/week    14 Cans of beer per week     Comment: 2 beer daily--  (hx alcohol abuse)   OB History    No data available     Review of Systems  Constitutional: Negative for fever and chills.  Gastrointestinal: Negative for nausea, vomiting, abdominal pain, diarrhea, constipation, blood in stool and abdominal distention.  Genitourinary: Negative for dysuria, hematuria and difficulty urinating.  Musculoskeletal: Positive for myalgias, back pain and gait problem. Negative for neck pain and neck stiffness.  Skin: Negative for rash and wound.  Allergic/Immunologic: Negative for immunocompromised state.  Neurological: Negative for weakness and numbness.  Hematological: Does not bruise/bleed easily.  Psychiatric/Behavioral: Negative for self-injury.   Allergies  Diphenhydramine hcl  Home Medications   Prior to Admission medications   Medication Sig Start Date  End Date Taking? Authorizing Provider  albuterol (PROVENTIL HFA;VENTOLIN HFA) 108 (90 BASE) MCG/ACT inhaler Inhale 2 puffs into the lungs every 6 (six) hours as needed for wheezing or shortness of breath.    Historical Provider, MD  amLODipine (NORVASC) 2.5 MG tablet Take 2.5 mg by mouth every morning.     Historical Provider, MD  aspirin EC 325 MG tablet Take 1 tablet (325 mg total) by mouth 2 (two) times daily. 01/31/14   Bryson L Stilwell, PA-C   citalopram (CELEXA) 20 MG tablet Take 20 mg by mouth daily.    Historical Provider, MD  gabapentin (NEURONTIN) 300 MG capsule Take 300 mg by mouth 2 (two) times daily.    Historical Provider, MD  lisinopril-hydrochlorothiazide (PRINZIDE,ZESTORETIC) 20-12.5 MG per tablet Take 1 tablet by mouth every morning.     Historical Provider, MD  omeprazole (PRILOSEC) 20 MG capsule Take 20 mg by mouth daily.    Historical Provider, MD  oxyCODONE-acetaminophen (PERCOCET/ROXICET) 5-325 MG per tablet Take 1 tablet by mouth every 8 (eight) hours as needed for moderate pain or severe pain. 07/15/14   Marissa Sciacca, PA-C  pravastatin (PRAVACHOL) 40 MG tablet Take 40 mg by mouth every morning.     Historical Provider, MD  predniSONE (DELTASONE) 20 MG tablet 3 tabs po day one, then 2 tabs daily x 4 days 07/15/14   Marissa Sciacca, PA-C  QUEtiapine (SEROQUEL) 100 MG tablet Take 100 mg by mouth at bedtime.    Historical Provider, MD  traZODone (DESYREL) 50 MG tablet Take 50 mg by mouth 2 (two) times daily.    Historical Provider, MD   BP 165/86 mmHg  Pulse 82  Temp(Src) 98.2 F (36.8 C) (Oral)  Resp 18  Ht  (1.626 m)  Wt 170 lb (77.111 kg)  BMI 29.17 kg/m2  SpO2 100% Physical Exam  Constitutional: She is oriented to person, place, and time. She appears well-developed and well-nourished. No distress.  HENT:  Head: Normocephalic and atraumatic.  Eyes: Conjunctivae and EOM are normal.  Neck: Neck supple.  Cardiovascular: Normal rate.   Pulmonary/Chest: Effort normal.  Abdominal: Soft. She exhibits no distension and no mass. There is no tenderness. There is no rebound and no guarding.  Musculoskeletal: Normal range of motion.  Tender to bilateral SI joints.  Neurological: She is alert and oriented to person, place, and time.  Skin: Skin is warm and dry. She is not diaphoretic.  Psychiatric: She has a normal mood and affect. Her behavior is normal.  Nursing note and vitals reviewed.   ED Course   Procedures (including critical care time) DIAGNOSTIC STUDIES: Oxygen Saturation is 100% on RA, normal by my interpretation.    COORDINATION OF CARE: 12:57 PM- Pt advised of plan for treatment and pt agrees.  Labs Review Labs Reviewed - No data to display  Imaging Review No results found.   EKG Interpretation None      MDM   Final diagnoses:  Exacerbation of chronic back pain    Afebrile, nontoxic patient with exacerbation of chronic back pain.  No red flags with history or exam.  Neurovascularly intact.  Emergent imaging not indicated at this time.   D/C home with robaxin, ibuprofen, PCP follow up.  Discussed result, findings, treatment, and follow up  with patient.  Pt given return precautions.  Pt verbalizes understanding and agrees with plan.        I personally performed the services described in this documentation, which was scribed in my presence. The  recorded information has been reviewed and is accurate.    Trixie Dredgemily Johsua Shevlin, PA-C 11/28/14 1329  Rolan BuccoMelanie Belfi, MD 11/28/14 [phone]

## 2014-11-28 NOTE — Discharge Instructions (Signed)
Read the information below.  Use the prescribed medication as directed.  Please discuss all new medications with your pharmacist.  You may return to the Emergency Department at any time for worsening condition or any new symptoms that concern you.    If you develop fevers, loss of control of bowel or bladder, weakness or numbness in your legs, or are unable to walk, return to the ER for a recheck.  ° ° ° °Chronic Back Pain ° When back pain lasts longer than 3 months, it is called chronic back pain. People with chronic back pain often go through certain periods that are more intense (flare-ups).  °CAUSES °Chronic back pain can be caused by wear and tear (degeneration) on different structures in your back. These structures include: °· The bones of your spine (vertebrae) and the joints surrounding your spinal cord and nerve roots (facets). °· The strong, fibrous tissues that connect your vertebrae (ligaments). °Degeneration of these structures may result in pressure on your nerves. This can lead to constant pain. °HOME CARE INSTRUCTIONS °· Avoid bending, heavy lifting, prolonged sitting, and activities which make the problem worse. °· Take brief periods of rest throughout the day to reduce your pain. Lying down or standing usually is better than sitting while you are resting. °· Take over-the-counter or prescription medicines only as directed by your caregiver. °SEEK IMMEDIATE MEDICAL CARE IF:  °· You have weakness or numbness in one of your legs or feet. °· You have trouble controlling your bladder or bowels. °· You have nausea, vomiting, abdominal pain, shortness of breath, or fainting. °Document Released: 09/01/2004 Document Revised: 10/17/2011 Document Reviewed: 07/09/2011 °Exit[employer]® Patient Information ©2015 [employer]. This information is not intended to replace advice given to you by your health care provider. Make sure you discuss any questions you have with your health care provider. ° °

## 2014-11-28 NOTE — ED Notes (Signed)
Pt reports chronic lower back pain worsening yesterday. Pt denies recent injury.

## 2014-12-01 ENCOUNTER — Emergency Department (HOSPITAL_COMMUNITY)
Admission: EM | Admit: 2014-12-01 | Discharge: 2014-12-01 | Disposition: A | Discharge status: Home / Self Care | Payer: Medicaid Other | Attending: Emergency Medicine | Admitting: Emergency Medicine

## 2014-12-01 ENCOUNTER — Encounter (HOSPITAL_COMMUNITY): Payer: Self-pay | Admitting: Emergency Medicine

## 2014-12-01 DIAGNOSIS — E785 Hyperlipidemia, unspecified: Secondary | ICD-10-CM | POA: Diagnosis not present

## 2014-12-01 DIAGNOSIS — J45909 Unspecified asthma, uncomplicated: Secondary | ICD-10-CM | POA: Insufficient documentation

## 2014-12-01 DIAGNOSIS — Z79899 Other long term (current) drug therapy: Secondary | ICD-10-CM | POA: Insufficient documentation

## 2014-12-01 DIAGNOSIS — Z973 Presence of spectacles and contact lenses: Secondary | ICD-10-CM | POA: Insufficient documentation

## 2014-12-01 DIAGNOSIS — Z7982 Long term (current) use of aspirin: Secondary | ICD-10-CM | POA: Insufficient documentation

## 2014-12-01 DIAGNOSIS — G8929 Other chronic pain: Secondary | ICD-10-CM | POA: Diagnosis not present

## 2014-12-01 DIAGNOSIS — M25552 Pain in left hip: Secondary | ICD-10-CM | POA: Diagnosis present

## 2014-12-01 DIAGNOSIS — M5432 Sciatica, left side: Secondary | ICD-10-CM | POA: Diagnosis not present

## 2014-12-01 DIAGNOSIS — Z87828 Personal history of other (healed) physical injury and trauma: Secondary | ICD-10-CM | POA: Diagnosis not present

## 2014-12-01 DIAGNOSIS — I10 Essential (primary) hypertension: Secondary | ICD-10-CM | POA: Insufficient documentation

## 2014-12-01 DIAGNOSIS — F319 Bipolar disorder, unspecified: Secondary | ICD-10-CM | POA: Diagnosis not present

## 2014-12-01 DIAGNOSIS — Z8719 Personal history of other diseases of the digestive system: Secondary | ICD-10-CM | POA: Insufficient documentation

## 2014-12-01 DIAGNOSIS — Z720 Tobacco use: Secondary | ICD-10-CM | POA: Diagnosis not present

## 2014-12-01 DIAGNOSIS — M1990 Unspecified osteoarthritis, unspecified site: Secondary | ICD-10-CM | POA: Insufficient documentation

## 2014-12-01 MED ORDER — PREDNISONE 10 MG PO TABS: ORAL_TABLET | ORAL | Status: DC

## 2014-12-01 NOTE — Discharge Instructions (Signed)
Back Pain, Adult °Back pain is very common. The pain often gets better over time. The cause of back pain is usually not dangerous. Most people can learn to manage their back pain on their own.  °HOME CARE  °· Stay active. Start with short walks on flat ground if you can. Try to walk farther each day. °· Do not sit, drive, or stand in one place for more than 30 minutes. Do not stay in bed. °· Do not avoid exercise or work. Activity can help your back heal faster. °· Be careful when you bend or lift an object. Bend at your knees, keep the object close to you, and do not twist. °· Sleep on a firm mattress. Lie on your side, and bend your knees. If you lie on your back, put a pillow under your knees. °· Only take medicines as told by your doctor. °· Put ice on the injured area. °¨ Put ice in a plastic bag. °¨ Place a towel between your skin and the bag. °¨ Leave the ice on for 15-20 minutes, 03-04 times a day for the first 2 to 3 days. After that, you can switch between ice and heat packs. °· Ask your doctor about back exercises or massage. °· Avoid feeling anxious or stressed. Find good ways to deal with stress, such as exercise. °GET HELP RIGHT AWAY IF:  °· Your pain does not go away with rest or medicine. °· Your pain does not go away in 1 week. °· You have new problems. °· You do not feel well. °· The pain spreads into your legs. °· You cannot control when you poop (bowel movement) or pee (urinate). °· Your arms or legs feel weak or lose feeling (numbness). °· You feel sick to your stomach (nauseous) or throw up (vomit). °· You have belly (abdominal) pain. °· You feel like you may pass out (faint). °MAKE SURE YOU:  °· Understand these instructions. °· Will watch your condition. °· Will get help right away if you are not doing well or get worse. °Document Released: 01/11/2008 Document Revised: 10/17/2011 Document Reviewed: 11/26/2013 °Exit[employer]® Patient Information ©2015 [employer]. This information is not intended  to replace advice given to you by your health care provider. Make sure you discuss any questions you have with your health care provider. ° °

## 2014-12-01 NOTE — ED Provider Notes (Signed)
CSN: [id number]     Arrival date & time 12/01/14  1509 History   This chart was scribed for non-physician practitioner, Teressa Lower, NP, working with Pricilla Loveless, MD, by Lionel December, ED Scribe. This patient was seen in room WTR7/WTR7 and the patient's care was started at 3:36 PM.   The history is provided by the patient. No language interpreter was used.    HPI Comments: [name] [name] is a 49 y.o. female who presents to the Emergency Department complaining of radiating left sided back pain onset 12AM.  Patient notes that the pain is radiating down to her leg.  She was here recently for her back and was given ibuprofen and a muscle relaxer but denies any relief. Denies incontinence, numbness or weakness. No injury. Patient denies having any diabetes and states she does not see anyone for her back.  She has just tried to reapply for adult medicaid since her medicaid is running out soon.  She has no other complaints today.      Past Medical History  Diagnosis Date  . Hypertension   . Migraine   . DDD (degenerative disc disease)   . Sciatica   . Osteoarthritis   . Asthma   . GERD (gastroesophageal reflux disease)   . Right ACL tear   . Right knee meniscal tear   . Bipolar 1 disorder   . Depression   . Hyperlipidemia   . Chronic lumbar pain   . History of gastric ulcer   . Wears glasses    Past Surgical History  Procedure Laterality Date  . Bunionectomy Left 2011  . Heel spur surgery Right 2012  . Total abdominal hysterectomy w/ bilateral salpingoophorectomy  04-20-2006  . Knee arthroscopy with anterior cruciate ligament (acl) repair with hamstring graft Right 01/31/2014    Procedure: RIGHT KNEE ARTHROSCOPY WITH ALLOGRAFT (ACL) ANTERIOR CRUCIATE LIGAMENT RECONSTRUCTON PARTIAL MENISCECTOMY VERSES REPAIR ;  Surgeon: Eugenia Mcalpine, MD;  Location: [facility];  Service: Orthopedics;  Laterality: Right;   Family History  Problem Relation Age of Onset  .  Hypertension Father    History  Substance Use Topics  . Smoking status: Current Every Day Smoker -- 0.50 packs/day for 32 years    Types: Cigarettes  . Smokeless tobacco: Never Used  . Alcohol Use: 8.4 oz/week    14 Cans of beer per week     Comment: 2 beer daily--  (hx alcohol abuse)   OB History    No data available     Review of Systems  Musculoskeletal: Positive for back pain.  All other systems reviewed and are negative.     Allergies  Diphenhydramine hcl  Home Medications   Prior to Admission medications   Medication Sig Start Date End Date Taking? Authorizing Provider  albuterol (PROVENTIL HFA;VENTOLIN HFA) 108 (90 BASE) MCG/ACT inhaler Inhale 2 puffs into the lungs every 6 (six) hours as needed for wheezing or shortness of breath.    Historical Provider, MD  amLODipine (NORVASC) 2.5 MG tablet Take 2.5 mg by mouth every morning.     Historical Provider, MD  aspirin EC 325 MG tablet Take 1 tablet (325 mg total) by mouth 2 (two) times daily. Patient taking differently: Take 325 mg by mouth 2 (two) times daily as needed for mild pain or moderate pain.  01/31/14   Bryson L Stilwell, PA-C  citalopram (CELEXA) 20 MG tablet Take 20 mg by mouth daily.    Historical Provider, MD  ibuprofen (ADVIL,MOTRIN) 800  MG tablet Take 1 tablet (800 mg total) by mouth every 8 (eight) hours as needed for mild pain or moderate pain. 11/28/14   Trixie DredgeEmily West, PA-C  lisinopril-hydrochlorothiazide (PRINZIDE,ZESTORETIC) 20-12.5 MG per tablet Take 1 tablet by mouth every morning.     Historical Provider, MD  methocarbamol (ROBAXIN) 750 MG tablet Take 1 tablet (750 mg total) by mouth every 6 (six) hours as needed for muscle spasms (and pain). 11/28/14   Trixie DredgeEmily West, PA-C  oxyCODONE-acetaminophen (PERCOCET/ROXICET) 5-325 MG per tablet Take 1 tablet by mouth every 8 (eight) hours as needed for moderate pain or severe pain. 07/15/14   Marissa Sciacca, PA-C  pravastatin (PRAVACHOL) 40 MG tablet Take 40 mg by  mouth every morning.     Historical Provider, MD  predniSONE (DELTASONE) 20 MG tablet 3 tabs po day one, then 2 tabs daily x 4 days 07/15/14   Marissa Sciacca, PA-C   There were no vitals taken for this visit. Physical Exam  Constitutional: She is oriented to person, place, and time. She appears well-developed and well-nourished. No distress.  HENT:  Head: Normocephalic and atraumatic.  Eyes: Conjunctivae and EOM are normal.  Neck: Neck supple.  Cardiovascular: Normal rate.   Pulmonary/Chest: Effort normal. No respiratory distress.  Musculoskeletal: Normal range of motion.  Left sciatic notch tenderness. Full rom of bilaeral lower extremities  Neurological: She is alert and oriented to person, place, and time. She exhibits normal muscle tone. Coordination normal.  Skin: Skin is warm and dry.  Psychiatric: She has a normal mood and affect. Her behavior is normal.  Nursing note and vitals reviewed.   ED Course  Procedures (including critical care time)  COORDINATION OF CARE: 3:40 PM Discussed treatment plan with patient at beside, the patient agrees with the plan and has no further questions at this time.   Labs Review Labs Reviewed - No data to display  Imaging Review No results found.   EKG Interpretation None      MDM   Final diagnoses:  Sciatica, left   No red flags. No imaging needed at this time. Will give prednisone. Pt given ortho follow up  I personally performed the services described in this documentation, which was scribed in my presence. The recorded information has been reviewed and is accurate.    Teressa LowerVrinda Crystalynn Mcinerney, NP 12/01/14 1556  Pricilla LovelessScott Goldston, MD 12/02/14 [phone]

## 2014-12-01 NOTE — ED Notes (Signed)
Pt reports persistent hip and leg pain on l/side

## 2014-12-05 ENCOUNTER — Other Ambulatory Visit: Payer: Self-pay | Admitting: Internal Medicine

## 2014-12-05 DIAGNOSIS — R5381 Other malaise: Secondary | ICD-10-CM

## 2014-12-10 ENCOUNTER — Other Ambulatory Visit: Payer: Self-pay | Admitting: Internal Medicine

## 2014-12-10 DIAGNOSIS — E559 Vitamin D deficiency, unspecified: Secondary | ICD-10-CM

## 2014-12-10 DIAGNOSIS — Z1231 Encounter for screening mammogram for malignant neoplasm of breast: Secondary | ICD-10-CM

## 2014-12-10 DIAGNOSIS — R928 Other abnormal and inconclusive findings on diagnostic imaging of breast: Secondary | ICD-10-CM

## 2015-01-26 ENCOUNTER — Ambulatory Visit: Payer: Medicaid Other | Attending: Family Medicine

## 2015-06-07 ENCOUNTER — Encounter (HOSPITAL_COMMUNITY): Payer: Self-pay

## 2015-06-07 ENCOUNTER — Emergency Department (HOSPITAL_COMMUNITY)
Admission: EM | Admit: 2015-06-07 | Discharge: 2015-06-07 | Disposition: A | Discharge status: Home / Self Care | Payer: Self-pay | Attending: Emergency Medicine | Admitting: Emergency Medicine

## 2015-06-07 ENCOUNTER — Emergency Department (HOSPITAL_COMMUNITY): Payer: Medicaid Other

## 2015-06-07 DIAGNOSIS — E785 Hyperlipidemia, unspecified: Secondary | ICD-10-CM | POA: Insufficient documentation

## 2015-06-07 DIAGNOSIS — K529 Noninfective gastroenteritis and colitis, unspecified: Secondary | ICD-10-CM | POA: Insufficient documentation

## 2015-06-07 DIAGNOSIS — Z79899 Other long term (current) drug therapy: Secondary | ICD-10-CM | POA: Insufficient documentation

## 2015-06-07 DIAGNOSIS — Z973 Presence of spectacles and contact lenses: Secondary | ICD-10-CM | POA: Insufficient documentation

## 2015-06-07 DIAGNOSIS — K2970 Gastritis, unspecified, without bleeding: Secondary | ICD-10-CM | POA: Insufficient documentation

## 2015-06-07 DIAGNOSIS — F319 Bipolar disorder, unspecified: Secondary | ICD-10-CM | POA: Insufficient documentation

## 2015-06-07 DIAGNOSIS — Z87828 Personal history of other (healed) physical injury and trauma: Secondary | ICD-10-CM | POA: Insufficient documentation

## 2015-06-07 DIAGNOSIS — G8929 Other chronic pain: Secondary | ICD-10-CM | POA: Insufficient documentation

## 2015-06-07 DIAGNOSIS — Z8739 Personal history of other diseases of the musculoskeletal system and connective tissue: Secondary | ICD-10-CM | POA: Insufficient documentation

## 2015-06-07 DIAGNOSIS — G43909 Migraine, unspecified, not intractable, without status migrainosus: Secondary | ICD-10-CM | POA: Insufficient documentation

## 2015-06-07 DIAGNOSIS — Z720 Tobacco use: Secondary | ICD-10-CM | POA: Insufficient documentation

## 2015-06-07 DIAGNOSIS — J45909 Unspecified asthma, uncomplicated: Secondary | ICD-10-CM | POA: Insufficient documentation

## 2015-06-07 LAB — CBC
HCT: 34.7 % — ABNORMAL LOW (ref 36.0–46.0)
Hemoglobin: 11.9 g/dL — ABNORMAL LOW (ref 12.0–15.0)
MCH: 31.6 pg (ref 26.0–34.0)
MCHC: 34.3 g/dL (ref 30.0–36.0)
MCV: 92 fL (ref 78.0–100.0)
Platelets: 298 10*3/uL (ref 150–400)
RBC: 3.77 MIL/uL — ABNORMAL LOW (ref 3.87–5.11)
RDW: 13.8 % (ref 11.5–15.5)
WBC: 4.8 10*3/uL (ref 4.0–10.5)

## 2015-06-07 LAB — COMPREHENSIVE METABOLIC PANEL
ALT: 19 U/L (ref 14–54)
AST: 38 U/L (ref 15–41)
Albumin: 4.1 g/dL (ref 3.5–5.0)
Alkaline Phosphatase: 49 U/L (ref 38–126)
Anion gap: 9 (ref 5–15)
BUN: 16 mg/dL (ref 6–20)
CO2: 24 mmol/L (ref 22–32)
Calcium: 8.9 mg/dL (ref 8.9–10.3)
Chloride: 108 mmol/L (ref 101–111)
Creatinine, Ser: 0.82 mg/dL (ref 0.44–1.00)
GFR calc Af Amer: >60 mL/min (ref >60)
GFR calc non Af Amer: >60 mL/min (ref >60)
Glucose, Bld: 71 mg/dL (ref 65–99)
Potassium: 3.9 mmol/L (ref 3.5–5.1)
Sodium: 141 mmol/L (ref 135–145)
Total Bilirubin: 0.4 mg/dL (ref 0.3–1.2)
Total Protein: 8.1 g/dL (ref 6.5–8.1)

## 2015-06-07 LAB — URINALYSIS, ROUTINE W REFLEX MICROSCOPIC
Bilirubin Urine: NEGATIVE
Glucose, UA: NEGATIVE mg/dL
Hgb urine dipstick: NEGATIVE
Ketones, ur: NEGATIVE mg/dL
Leukocytes, UA: NEGATIVE
Nitrite: NEGATIVE
Protein, ur: NEGATIVE mg/dL
Specific Gravity, Urine: 1.025 (ref 1.005–1.030)
Urobilinogen, UA: 0.2 mg/dL (ref 0.0–1.0)
pH: 5.5 (ref 5.0–8.0)

## 2015-06-07 LAB — I-STAT BETA HCG BLOOD, ED (MC, WL, AP ONLY): I-stat hCG, quantitative: <5 m[IU]/mL (ref <5)

## 2015-06-07 LAB — LIPASE, BLOOD: Lipase: 44 U/L (ref 11–51)

## 2015-06-07 MED ORDER — GI COCKTAIL ~~LOC~~
30.0000 mL | Freq: Once | ORAL | Status: AC
  Administered 2015-06-07: 30 mL via ORAL
  Filled 2015-06-07: qty 30

## 2015-06-07 MED ORDER — PANTOPRAZOLE SODIUM 20 MG PO TBEC: 40.0000 mg | DELAYED_RELEASE_TABLET | Freq: Every day | ORAL | Status: DC

## 2015-06-07 MED ORDER — DICYCLOMINE HCL 20 MG PO TABS: 20.0000 mg | ORAL_TABLET | Freq: Two times a day (BID) | ORAL | Status: DC

## 2015-06-07 MED ORDER — PANTOPRAZOLE SODIUM 40 MG IV SOLR
40.0000 mg | Freq: Once | INTRAVENOUS | Status: AC
  Administered 2015-06-07: 40 mg via INTRAVENOUS
  Filled 2015-06-07: qty 40

## 2015-06-07 MED ORDER — IOHEXOL 300 MG/ML  SOLN
100.0000 mL | Freq: Once | INTRAMUSCULAR | Status: AC | PRN
  Administered 2015-06-07: 100 mL via INTRAVENOUS

## 2015-06-07 NOTE — ED Provider Notes (Signed)
CSN: [id number]     Arrival date & time 06/07/15  1137 History   First MD Initiated Contact with Patient 06/07/15 1258     Chief Complaint  Patient presents with  . Abdominal Pain     (Consider location/radiation/quality/duration/timing/severity/associated sxs/prior Treatment) Patient is a 49 y.o. female presenting with abdominal pain.  Abdominal Pain Pain location:  Epigastric Pain quality: sharp   Pain radiates to:  Does not radiate Pain severity:  Severe Onset quality:  Gradual Duration:  1 day Timing:  Constant Progression:  Worsening Chronicity:  New Context: alcohol use   Relieved by:  Nothing Worsened by:  Eating Associated symptoms: cough (mild), nausea and vomiting (streaks of bright red blood, stomache contnets)   Associated symptoms: no chest pain, no diarrhea, no dysuria, no fever, no shortness of breath (with severe pain), no sore throat, no vaginal bleeding and no vaginal discharge   Risk factors: alcohol abuse (every day, yesterday drank liquor)   Risk factors: has not had multiple surgeries, no NSAID use and no recent hospitalization   Risk factors comment:  Ulcer since 49yo   Past Medical History  Diagnosis Date  . Hypertension   . Migraine   . DDD (degenerative disc disease)   . Sciatica   . Osteoarthritis   . Asthma   . GERD (gastroesophageal reflux disease)   . Right ACL tear   . Right knee meniscal tear   . Bipolar 1 disorder (HCC)   . Depression   . Hyperlipidemia   . Chronic lumbar pain   . History of gastric ulcer   . Wears glasses    Past Surgical History  Procedure Laterality Date  . Bunionectomy Left 2011  . Heel spur surgery Right 2012  . Total abdominal hysterectomy w/ bilateral salpingoophorectomy  04-20-2006  . Knee arthroscopy with anterior cruciate ligament (acl) repair with hamstring graft Right 01/31/2014    Procedure: RIGHT KNEE ARTHROSCOPY WITH ALLOGRAFT (ACL) ANTERIOR CRUCIATE LIGAMENT RECONSTRUCTON PARTIAL MENISCECTOMY  VERSES REPAIR ;  Surgeon: Eugenia Mcalpine, MD;  Location: [facility];  Service: Orthopedics;  Laterality: Right;  . Abdominal hysterectomy     Family History  Problem Relation Age of Onset  . Hypertension Father    Social History  Substance Use Topics  . Smoking status: Current Every Day Smoker -- 0.50 packs/day for 32 years    Types: Cigarettes  . Smokeless tobacco: Never Used  . Alcohol Use: 8.4 oz/week    14 Cans of beer per week     Comment: 4 40 ounce beers and liquor   OB History    No data available     Review of Systems  Constitutional: Negative for fever.  HENT: Negative for sore throat.   Eyes: Negative for visual disturbance.  Respiratory: Positive for cough (mild). Negative for shortness of breath (with severe pain).   Cardiovascular: Negative for chest pain.  Gastrointestinal: Positive for nausea, vomiting (streaks of bright red blood, stomache contnets) and abdominal pain. Negative for diarrhea.  Genitourinary: Negative for dysuria, vaginal bleeding, vaginal discharge and difficulty urinating.  Musculoskeletal: Negative for back pain and neck pain.  Skin: Negative for rash.  Neurological: Negative for syncope and headaches. Light-headedness: lightheaded yesterday, improved now.      Allergies  Diphenhydramine hcl  Home Medications   Prior to Admission medications   Medication Sig Start Date End Date Taking? Authorizing Provider  albuterol (PROVENTIL HFA;VENTOLIN HFA) 108 (90 BASE) MCG/ACT inhaler Inhale 2 puffs into the lungs every 6 (  six) hours as needed for wheezing or shortness of breath.   Yes Historical Provider, MD  amLODipine (NORVASC) 2.5 MG tablet Take 2.5 mg by mouth every morning.    Yes Historical Provider, MD  citalopram (CELEXA) 20 MG tablet Take 20 mg by mouth daily.   Yes Historical Provider, MD  gabapentin (NEURONTIN) 300 MG capsule Take 300 mg by mouth 3 (three) times daily as needed (for pain).    Yes Historical Provider, MD   lisinopril-hydrochlorothiazide (PRINZIDE,ZESTORETIC) 20-12.5 MG per tablet Take 1 tablet by mouth every morning.    Yes Historical Provider, MD  pravastatin (PRAVACHOL) 40 MG tablet Take 40 mg by mouth every morning.    Yes Historical Provider, MD  dicyclomine (BENTYL) 20 MG tablet Take 1 tablet (20 mg total) by mouth 2 (two) times daily. 06/07/15   Alvira MondayErin Brodey Bonn, MD  ibuprofen (ADVIL,MOTRIN) 800 MG tablet Take 1 tablet (800 mg total) by mouth every 8 (eight) hours as needed for mild pain or moderate pain. Patient not taking: Reported on 06/07/2015 11/28/14   Trixie DredgeEmily West, PA-C  methocarbamol (ROBAXIN) 750 MG tablet Take 1 tablet (750 mg total) by mouth every 6 (six) hours as needed for muscle spasms (and pain). Patient not taking: Reported on 06/07/2015 11/28/14   Trixie DredgeEmily West, PA-C  pantoprazole (PROTONIX) 20 MG tablet Take 2 tablets (40 mg total) by mouth daily. 06/07/15   Alvira MondayErin Drako Maese, MD  predniSONE (DELTASONE) 10 MG tablet 6 day step down dose Patient not taking: Reported on 06/07/2015 12/01/14   Teressa LowerVrinda Pickering, NP   BP 157/87 mmHg  Pulse 79  Temp(Src) 97.9 F (36.6 C) (Oral)  Resp 20  SpO2 100% Physical Exam  Constitutional: She is oriented to person, place, and time. She appears well-developed and well-nourished. No distress.  HENT:  Head: Normocephalic and atraumatic.  Eyes: Conjunctivae and EOM are normal.  Neck: Normal range of motion.  Cardiovascular: Normal rate, regular rhythm, normal heart sounds and intact distal pulses.  Exam reveals no gallop and no friction rub.   No murmur heard. Pulmonary/Chest: Effort normal and breath sounds normal. No respiratory distress. She has no wheezes. She has no rales.  Abdominal: Soft. She exhibits no distension. There is tenderness in the epigastric area and left lower quadrant. There is no guarding, no CVA tenderness, no tenderness at McBurney's point and negative Murphy's sign.  Musculoskeletal: She exhibits no edema or tenderness.   Neurological: She is alert and oriented to person, place, and time.  Skin: Skin is warm and dry. No rash noted. She is not diaphoretic. No erythema.  Nursing note and vitals reviewed.   ED Course  Procedures (including critical care time) Labs Review Labs Reviewed  CBC - Abnormal; Notable for the following:    RBC 3.77 (*)    Hemoglobin 11.9 (*)    HCT 34.7 (*)    All other components within normal limits  URINALYSIS, ROUTINE W REFLEX MICROSCOPIC (NOT AT [facility]) - Abnormal; Notable for the following:    APPearance CLOUDY (*)    All other components within normal limits  LIPASE, BLOOD  COMPREHENSIVE METABOLIC PANEL  I-STAT BETA HCG BLOOD, ED ([facility], [facility], [facility] ONLY)    Imaging Review Ct Abdomen Pelvis W Contrast  06/07/2015  CLINICAL DATA:  Abdominal pain with nausea and vomiting in the past 24 hours, left lower quadrant tenderness EXAM: CT ABDOMEN AND PELVIS WITH CONTRAST TECHNIQUE: Multidetector CT imaging of the abdomen and pelvis was performed using the standard protocol following bolus administration of  intravenous contrast. CONTRAST:  OMNIPAQUE IOHEXOL 300 MG/ML  SOLN COMPARISON:  04/27/2011 FINDINGS: Lower chest:  Negative Hepatobiliary: Moderate hepatic steatosis. Previously suspected left hepatic cyst not visualized on the current study. Pancreas: Normal Spleen: Normal Adrenals/Urinary Tract: Normal Stomach/Bowel: No evidence of obstruction. Stomach, small bowel, and appendix are normal. There is mild wall thickening of the cecum and ascending colon, without significant surrounding inflammatory change. There are small right lower quadrant mesenteric lymph nodes. Vascular/Lymphatic: Normal Reproductive: Uterus not identified.  No evidence of adnexal mass. Other: There is no free fluid in the abdomen or pelvis. Musculoskeletal: There are no acute musculoskeletal findings. IMPRESSION: Mild wall thickening of the cecum and right colon, new from prior study. The findings suggest mild  colitis of nonspecific etiology. Electronically Signed   By: Esperanza Heir M.D.   On: 06/07/2015 16:20   I have personally reviewed and evaluated these images and lab results as part of my medical decision-making.   EKG Interpretation None      MDM   Final diagnoses:  Colitis  Gastritis     49 year old female with a history of hypertension, depression,  Bipolar type I, migraine , gastroesophageal reflux , alcohol abuse , presents with concern of epigastric abdominal pain in setting of increased alcohol use yesterday. DDx includes appendicitis, pancreatitis, cholecystitis, pyelonephritis, nephrolithiasis, diverticulitis, PID, ovarian torsion, ectopic pregnancy, and tuboovarian abscess. Pt has hx of hysterectomy.  Lipase is within normal limits. Urinalysis shows no signs of UTI. CT abdomen and pelvis was done given patient had some lower abdominal tenderness on exam , and showed findings of mild colitis and cecal inflammation without other acute pathology.   Given this finding, no vaginal discharge , and overall patient concerned regarding epigastric pain, have low suspicion for PID, torsion or tubo-ovarian abscess.  Given increased alcohol use yesterday, symptoms most likely represent alcoholic gastritis.   Patient was given a prescription for Protonix , and was given Bentyl for abdominal pain related to possible colitis. Patient discharged in stable condition with understanding of reasons to return.    Alvira Monday, MD 06/08/15 0900

## 2015-06-07 NOTE — ED Notes (Signed)
Pt taken to CT scan and returned to room without distress noted. 

## 2015-06-07 NOTE — ED Notes (Signed)
Awake. Verbally responsive. A/O x4. Resp even and unlabored. No audible adventitious breath sounds noted. ABC's intact.  

## 2015-06-07 NOTE — ED Notes (Signed)
Pt reported abd pain with n/v x2 and denies diarrhea in past 24 hrs. Pt denies hematemesis. Abd soft/nondistended but tender to palpate. BS (+) x4 quadrants.

## 2015-06-07 NOTE — ED Notes (Signed)
Awake. Verbally responsive. A/O x4. Resp even and unlabored. No audible adventitious breath sounds noted. ABC's intact. IV saline lock patent and intact. Family at bedside. 

## 2015-06-07 NOTE — ED Notes (Signed)
Awake. Verbally responsive. Resp even and unlabored. No audible adventitious breath sounds noted. ABC's intact. Abd soft/nondistended but tender to palpate. BS (+) and active x4 quadrants. No N/V/D reported. Iv saline lock patent and intact.

## 2015-06-07 NOTE — ED Notes (Addendum)
Patient states she woke this AM with mid abdominal pain and nausea. Patiaent states she drannk more alcohol than usual this weekend.

## 2015-06-07 NOTE — Discharge Instructions (Signed)
°  Colitis °Colitis is inflammation of the colon. Colitis may last a short time (acute) or it may last a long time (chronic). °CAUSES °This condition may be caused by: °· Viruses. °· Bacteria. °· Reactions to medicine. °· Certain autoimmune diseases, such as Crohn disease or ulcerative colitis. °SYMPTOMS °Symptoms of this condition include: °· Diarrhea. °· Passing bloody or tarry stool. °· Pain. °· Fever. °· Vomiting. °· Tiredness (fatigue). °· Weight loss. °· Bloating. °· Sudden increase in abdominal pain. °· Having fewer bowel movements than usual. °DIAGNOSIS °This condition is diagnosed with a stool test or a blood test. You may also have other tests, including X-rays, a CT scan, or a colonoscopy. °TREATMENT °Treatment may include: °· Resting the bowel. This involves not eating or drinking for a period of time. °· Fluids that are given through an IV tube. °· Medicine for pain and diarrhea. °· Antibiotic medicines. °· Cortisone medicines. °· Surgery. °HOME CARE INSTRUCTIONS °Eating and Drinking °· Follow instructions from your health care provider about eating or drinking restrictions. °· Drink enough fluid to keep your urine clear or pale yellow. °· Work with a dietitian to determine which foods cause your condition to flare up. °· Avoid foods that cause flare-ups. °· Eat a well-balanced diet. °Medicines °· Take over-the-counter and prescription medicines only as told by your health care provider. °· If you were prescribed an antibiotic medicine, take it as told by your health care provider. Do not stop taking the antibiotic even if you start to feel better. °General Instructions °· Keep all follow-up visits as told by your health care provider. This is important. °SEEK MEDICAL CARE IF: °· Your symptoms do not go away. °· You develop new symptoms. °SEEK IMMEDIATE MEDICAL CARE IF: °· You have a fever that does not go away with treatment. °· You develop chills. °· You have extreme weakness, fainting, or  dehydration. °· You have repeated vomiting. °· You develop severe pain in your abdomen. °· You pass bloody or tarry stool. °  °This information is not intended to replace advice given to you by your health care provider. Make sure you discuss any questions you have with your health care provider. °  °Document Released: 09/01/2004 Document Revised: 04/15/2015 Document Reviewed: 11/17/2014 °[employer]vier Interactive Patient Education ©2016 [employer] ° °

## 2015-08-03 ENCOUNTER — Emergency Department (HOSPITAL_COMMUNITY)
Admission: EM | Admit: 2015-08-03 | Discharge: 2015-08-04 | Disposition: A | Discharge status: Home / Self Care | Payer: Medicaid Other | Attending: Emergency Medicine | Admitting: Emergency Medicine

## 2015-08-03 ENCOUNTER — Encounter (HOSPITAL_COMMUNITY): Payer: Self-pay | Admitting: Emergency Medicine

## 2015-08-03 DIAGNOSIS — J45909 Unspecified asthma, uncomplicated: Secondary | ICD-10-CM | POA: Insufficient documentation

## 2015-08-03 DIAGNOSIS — E785 Hyperlipidemia, unspecified: Secondary | ICD-10-CM | POA: Insufficient documentation

## 2015-08-03 DIAGNOSIS — F1210 Cannabis abuse, uncomplicated: Secondary | ICD-10-CM | POA: Insufficient documentation

## 2015-08-03 DIAGNOSIS — Z79899 Other long term (current) drug therapy: Secondary | ICD-10-CM | POA: Insufficient documentation

## 2015-08-03 DIAGNOSIS — F17210 Nicotine dependence, cigarettes, uncomplicated: Secondary | ICD-10-CM | POA: Insufficient documentation

## 2015-08-03 DIAGNOSIS — M1990 Unspecified osteoarthritis, unspecified site: Secondary | ICD-10-CM | POA: Insufficient documentation

## 2015-08-03 DIAGNOSIS — K2920 Alcoholic gastritis without bleeding: Secondary | ICD-10-CM | POA: Insufficient documentation

## 2015-08-03 DIAGNOSIS — I10 Essential (primary) hypertension: Secondary | ICD-10-CM | POA: Insufficient documentation

## 2015-08-03 DIAGNOSIS — R112 Nausea with vomiting, unspecified: Secondary | ICD-10-CM

## 2015-08-03 DIAGNOSIS — Z87828 Personal history of other (healed) physical injury and trauma: Secondary | ICD-10-CM | POA: Insufficient documentation

## 2015-08-03 DIAGNOSIS — F319 Bipolar disorder, unspecified: Secondary | ICD-10-CM | POA: Insufficient documentation

## 2015-08-03 DIAGNOSIS — G8929 Other chronic pain: Secondary | ICD-10-CM | POA: Insufficient documentation

## 2015-08-03 DIAGNOSIS — R1084 Generalized abdominal pain: Secondary | ICD-10-CM

## 2015-08-03 DIAGNOSIS — G43909 Migraine, unspecified, not intractable, without status migrainosus: Secondary | ICD-10-CM | POA: Insufficient documentation

## 2015-08-03 DIAGNOSIS — F1010 Alcohol abuse, uncomplicated: Secondary | ICD-10-CM | POA: Insufficient documentation

## 2015-08-03 DIAGNOSIS — Z973 Presence of spectacles and contact lenses: Secondary | ICD-10-CM | POA: Insufficient documentation

## 2015-08-03 DIAGNOSIS — K219 Gastro-esophageal reflux disease without esophagitis: Secondary | ICD-10-CM | POA: Insufficient documentation

## 2015-08-03 LAB — CBC
HCT: 32.8 % — ABNORMAL LOW (ref 36.0–46.0)
Hemoglobin: 11.3 g/dL — ABNORMAL LOW (ref 12.0–15.0)
MCH: 32.5 pg (ref 26.0–34.0)
MCHC: 34.5 g/dL (ref 30.0–36.0)
MCV: 94.3 fL (ref 78.0–100.0)
Platelets: 317 10*3/uL (ref 150–400)
RBC: 3.48 MIL/uL — ABNORMAL LOW (ref 3.87–5.11)
RDW: 13.5 % (ref 11.5–15.5)
WBC: 6 10*3/uL (ref 4.0–10.5)

## 2015-08-03 LAB — COMPREHENSIVE METABOLIC PANEL WITH GFR
ALT: 11 U/L — ABNORMAL LOW (ref 14–54)
AST: 20 U/L (ref 15–41)
Albumin: 3.8 g/dL (ref 3.5–5.0)
Alkaline Phosphatase: 45 U/L (ref 38–126)
Anion gap: 10 (ref 5–15)
BUN: 17 mg/dL (ref 6–20)
CO2: 24 mmol/L (ref 22–32)
Calcium: 8.7 mg/dL — ABNORMAL LOW (ref 8.9–10.3)
Chloride: 111 mmol/L (ref 101–111)
Creatinine, Ser: 0.95 mg/dL (ref 0.44–1.00)
GFR calc Af Amer: >60 mL/min
GFR calc non Af Amer: >60 mL/min
Glucose, Bld: 86 mg/dL (ref 65–99)
Potassium: 3.9 mmol/L (ref 3.5–5.1)
Sodium: 145 mmol/L (ref 135–145)
Total Bilirubin: 0.4 mg/dL (ref 0.3–1.2)
Total Protein: 7.4 g/dL (ref 6.5–8.1)

## 2015-08-03 LAB — URINALYSIS, ROUTINE W REFLEX MICROSCOPIC
Bilirubin Urine: NEGATIVE
Glucose, UA: NEGATIVE mg/dL
Hgb urine dipstick: NEGATIVE
Ketones, ur: NEGATIVE mg/dL
Leukocytes, UA: NEGATIVE
Nitrite: NEGATIVE
Protein, ur: NEGATIVE mg/dL
Specific Gravity, Urine: 1.016 (ref 1.005–1.030)
pH: 8 (ref 5.0–8.0)

## 2015-08-03 LAB — LIPASE, BLOOD: Lipase: 32 U/L (ref 11–51)

## 2015-08-03 MED ORDER — MORPHINE SULFATE (PF) 4 MG/ML IV SOLN
4.0000 mg | Freq: Once | INTRAVENOUS | Status: AC
  Administered 2015-08-03: 4 mg via INTRAVENOUS
  Filled 2015-08-03: qty 1

## 2015-08-03 MED ORDER — ONDANSETRON HCL 4 MG/2ML IJ SOLN
4.0000 mg | Freq: Once | INTRAMUSCULAR | Status: AC
  Administered 2015-08-03: 4 mg via INTRAVENOUS
  Filled 2015-08-03: qty 2

## 2015-08-03 MED ORDER — GI COCKTAIL ~~LOC~~
30.0000 mL | Freq: Once | ORAL | Status: AC
  Administered 2015-08-03: 30 mL via ORAL
  Filled 2015-08-03: qty 30

## 2015-08-03 MED ORDER — DICYCLOMINE HCL 20 MG PO TABS: 20.0000 mg | ORAL_TABLET | Freq: Two times a day (BID) | ORAL | Status: DC

## 2015-08-03 MED ORDER — SUCRALFATE 1 G PO TABS: 1.0000 g | ORAL_TABLET | Freq: Two times a day (BID) | ORAL | Status: DC

## 2015-08-03 MED ORDER — OMEPRAZOLE 40 MG PO CPDR: 40.0000 mg | DELAYED_RELEASE_CAPSULE | Freq: Every day | ORAL | Status: DC

## 2015-08-03 MED ORDER — PANTOPRAZOLE SODIUM 40 MG IV SOLR
40.0000 mg | Freq: Once | INTRAVENOUS | Status: AC
  Administered 2015-08-03: 40 mg via INTRAVENOUS
  Filled 2015-08-03: qty 40

## 2015-08-03 MED ORDER — FAMOTIDINE 40 MG PO TABS: 40.0000 mg | ORAL_TABLET | Freq: Every day | ORAL | Status: DC

## 2015-08-03 MED ORDER — FENTANYL CITRATE (PF) 100 MCG/2ML IJ SOLN
50.0000 ug | Freq: Once | INTRAMUSCULAR | Status: AC
  Administered 2015-08-03: 50 ug via INTRAVENOUS
  Filled 2015-08-03: qty 2

## 2015-08-03 NOTE — ED Provider Notes (Signed)
CSN: [id number]     Arrival date & time 08/03/15  1532 History   First MD Initiated Contact with Patient 08/03/15 2004     Chief Complaint  Patient presents with  . Abdominal Pain    HPI   MS. Angulo is an 49 y.o. female with history of GERD, gastric ulcer, HTN, bipolar, HLD, and alcohol abuse who presents to the ED for evaluation of abdominal pain. She states that she has chronic abdominal pain due to gastric and duodenal ulcers but this morning she began having epigastric pain that is much worse than her chronic pain. She states that she has a history of alcohol abuse but in particular over the past 3 days she has been drinking more than usual due to increased stress at home. She admits to drinking at least one fifth of liquor plus several beers throughout the day. Last drink was last night. She endorses smoking 1/2 PPD, occasional marijuana use but denies other drugs. She reports associated nausea but denies emesis. Denies diarrhea. Denies hemoptysis or BRBPR. Denies dysuria, urinary frequency/urgency, vaginal complaints. Endorses depression about her home situation but denies SI/HI. Denies hallucinations.   Past Medical History  Diagnosis Date  . Hypertension   . Migraine   . DDD (degenerative disc disease)   . Sciatica   . Osteoarthritis   . Asthma   . GERD (gastroesophageal reflux disease)   . Right ACL tear   . Right knee meniscal tear   . Bipolar 1 disorder (HCC)   . Depression   . Hyperlipidemia   . Chronic lumbar pain   . History of gastric ulcer   . Wears glasses    Past Surgical History  Procedure Laterality Date  . Bunionectomy Left 2011  . Heel spur surgery Right 2012  . Total abdominal hysterectomy w/ bilateral salpingoophorectomy  04-20-2006  . Knee arthroscopy with anterior cruciate ligament (acl) repair with hamstring graft Right 01/31/2014    Procedure: RIGHT KNEE ARTHROSCOPY WITH ALLOGRAFT (ACL) ANTERIOR CRUCIATE LIGAMENT RECONSTRUCTON PARTIAL MENISCECTOMY VERSES  REPAIR ;  Surgeon: Eugenia Mcalpine, MD;  Location[facility];  Service: Orthopedics;  Laterality: Right;  . Abdominal hysterectomy     Family History  Problem Relation Age of Onset  . Hypertension Father    Social History  Substance Use Topics  . Smoking status: Current Every Day Smoker -- 0.50 packs/day for 32 years    Types: Cigarettes  . Smokeless tobacco: Never Used  . Alcohol Use: 8.4 oz/week    14 Cans of beer per week     Comment: 4 40 ounce beers and liquor   OB History    No data available     Review of Systems  All other systems reviewed and are negative.     Allergies  Diphenhydramine hcl  Home Medications   Prior to Admission medications   Medication Sig Start Date End Date Taking? Authorizing Provider  albuterol (PROVENTIL HFA;VENTOLIN HFA) 108 (90 BASE) MCG/ACT inhaler Inhale 2 puffs into the lungs every 6 (six) hours as needed for wheezing or shortness of breath.   Yes Historical Provider, MD  amLODipine (NORVASC) 2.5 MG tablet Take 2.5 mg by mouth every morning.    Yes Historical Provider, MD  dicyclomine (BENTYL) 20 MG tablet Take 1 tablet (20 mg total) by mouth 2 (two) times daily. 06/07/15  Yes Alvira Monday, MD  gabapentin (NEURONTIN) 300 MG capsule Take 300 mg by mouth 3 (three) times daily as needed (for pain).  Yes Historical Provider, MD  lisinopril-hydrochlorothiazide (PRINZIDE,ZESTORETIC) 20-12.5 MG per tablet Take 1 tablet by mouth every morning.    Yes Historical Provider, MD  omeprazole (PRILOSEC) 20 MG capsule Take 20 mg by mouth daily as needed (acid reflux).   Yes Historical Provider, MD  pravastatin (PRAVACHOL) 40 MG tablet Take 40 mg by mouth every morning.    Yes Historical Provider, MD  pantoprazole (PROTONIX) 20 MG tablet Take 2 tablets (40 mg total) by mouth daily. Patient not taking: Reported on 08/03/2015 06/07/15   Alvira Monday, MD  predniSONE (DELTASONE) 10 MG tablet 6 day step down dose Patient not taking:  Reported on 06/07/2015 12/01/14   Teressa Lower, NP   BP 165/103 mmHg  Pulse 59  Temp(Src) 98.2 F (36.8 C) (Oral)  Resp 18  SpO2 100% Physical Exam  Constitutional: She is oriented to person, place, and time.  HENT:  Right Ear: External ear normal.  Left Ear: External ear normal.  Nose: Nose normal.  Mouth/Throat: Oropharynx is clear and moist. No oropharyngeal exudate.  Eyes: Conjunctivae and EOM are normal. Pupils are equal, round, and reactive to light.  Neck: Normal range of motion. Neck supple.  Cardiovascular: Normal rate, regular rhythm, normal heart sounds and intact distal pulses.   Pulmonary/Chest: Effort normal and breath sounds normal. No respiratory distress. She has no wheezes. She exhibits no tenderness.  Abdominal: Soft. Bowel sounds are normal. She exhibits no distension. There is no tenderness. There is no rigidity, no rebound, no guarding and no CVA tenderness.  Musculoskeletal: She exhibits no edema.  Neurological: She is alert and oriented to person, place, and time. She has normal strength. She displays no tremor. No cranial nerve deficit or sensory deficit. She displays a negative Romberg sign. Coordination and gait normal.  Skin: Skin is warm and dry.  Psychiatric: She has a normal mood and affect.  Nursing note and vitals reviewed.  Filed Vitals:   08/03/15 1757 08/03/15 2008 08/03/15 2251 08/04/15 0020  BP: 156/103 165/103 157/93 176/97  Pulse: 67 59 68 64  Temp: 98.2 F (36.8 C)     TempSrc: Oral     Resp: SpO2: 98% 100% 99% 100%     ED Course  Procedures (including critical care time) Labs Review Labs Reviewed  COMPREHENSIVE METABOLIC PANEL - Abnormal; Notable for the following:    Calcium 8.7 (*)    ALT 11 (*)    All other components within normal limits  CBC - Abnormal; Notable for the following:    RBC 3.48 (*)    Hemoglobin 11.3 (*)    HCT 32.8 (*)    All other components within normal limits  LIPASE, BLOOD   URINALYSIS, ROUTINE W REFLEX MICROSCOPIC (NOT AT [facility])    Imaging Review No results found. I have personally reviewed and evaluated these images and lab results as part of my medical decision-making.   EKG Interpretation None      MDM   Final diagnoses:  Generalized abdominal pain  Non-intractable vomiting with nausea, vomiting of unspecified type  Alcohol abuse  Alcoholic gastritis   Pt is an 49 y.o. female with history of GERD and gastric/duodenal ulcers who presents to the ED for evaluation of epigastric pain, n/v. She admits a history of significant alcohol abuse which has increased in the past few days due to increased emotional stress at home. She had a CT scan in 05/2015 following similar presentation which showed some colitis thought likely to be  2/2 her alcohol abuse. She has a nonfocal exam in the ED today with labs at her baseline. She is hemodynamically. No indication for repeat imaging at this time. Her symptoms are likely secondary to alcoholic gastritis. We were able to control her pain with IV pain meds, zofran, and NS bolus. Will refer back to GI for re-eval and ongoing management. Outpatient resources given for alcohol abuse. Will give rx for omeprazole, pepcid, bentyl, and carafate. ER return precautions given.  Upon discharge pt reports return of pain. I offered admission for intractable pain but pt declines. Will give one last dose of fentanyl but pt understands I cannot give anymore narcotics after that.   Carlene CoriaSerena Y Bristyl Mclees, PA-C 08/04/15 1103  Arby BarretteMarcy Pfeiffer, MD 08/15/15 [phone]

## 2015-08-03 NOTE — ED Notes (Signed)
Pt able to tolerate PO fluids without emesis.  

## 2015-08-03 NOTE — ED Notes (Addendum)
Per EMS pt complaint of generalized abdominal pain and emesis onset this morning; hx of gastric ulcer and ETOH abuse.  Given 8 mg of zofran and 150 mcg of fentanyl en route with EMS.

## 2015-08-03 NOTE — Discharge Instructions (Signed)
Your labs today were normal. Your symptoms are likely due to alcohol use inducing gastritis and perhaps exacerbation of your ulcers. Please call [facility] to schedule follow-up evaluation. Please follow-up with your primary care provider within one week as well. I will also give you a list of community resources to help with your alcohol use. Return to the ED for new or worsening symptoms.    Emergency Department Resource Guide 1) Find a Doctor and Pay Out of Pocket Although you won't have to find out who is covered by your insurance plan, it is a good idea to ask around and get recommendations. You will then need to call the office and see if the doctor you have chosen will accept you as a new patient and what types of options they offer for patients who are self-pay. Some doctors offer discounts or will set up payment plans for their patients who do not have insurance, but you will need to ask so you aren't surprised when you get to your appointment.  2) Contact Your Local Health Department Not all health departments have doctors that can see patients for sick visits, but many do, so it is worth a call to see if yours does. If you don't know where your local health department is, you can check in your phone book. The CDC also has a tool to help you locate your state's health department, and many state websites also have listings of all of their local health departments.  3) Find a Walk-in Clinic If your illness is not likely to be very severe or complicated, you may want to try a walk in clinic. These are popping up all over the country in pharmacies, drugstores, and shopping centers. They're usually staffed by nurse practitioners or physician assistants that have been trained to treat common illnesses and complaints. They're usually fairly quick and inexpensive. However, if you have serious medical issues or chronic medical problems, these are probably not your best option.  No Primary Care  Doctor: - Call Health Connect at  [phone] - they can help you locate a primary care doctor that  accepts your insurance, provides certain services, etc. - Physician Referral Service- [phone]  Chronic Pain Problems: Organization         Address  Phone   Notes  [facility]  [phone] Patients need to be referred by their primary care doctor.   Medication Assistance: Organization         Address  Phone   Notes  [facility] [address] [place], [place] [address] [phone] --Must be a resident of [place] -- Must have NO insurance coverage whatsoever (no Medicaid/ Medicare, etc.) -- The pt. MUST have a primary care doctor that directs their care regularly and follows them in the community   MedAssist  [phone]   [employer]  [phone]    Agencies that provide inexpensive medical care: Organization         Address  Phone   Notes  [facility]  [phone]   [facility]    [phone]   [facility] [address] [place], [place] [address] [phone]   [facility] [address], [place] [phone]   [facility]anned Parenthood    [phone]   [facility]    [phone]   [facility]  [phone]  [address], [place] Phone:  [phone], Fax:  [phone] Hours of Operation:  9 am - 6 pm, M-F.  Also accepts Medicaid/Medicare and self-pay.  [facility] for [place] [address], [address], [place] Phone: [phone], Fax: [phone]. Hours of Operation:  8:30 am - 5:30 pm, M-F.  Also accepts Medicaid and self-pay.  [facility] [place] [address], [place] Phone: [phone]   [place], [place], [place] [phone], Ext. [phone] Mondays & Thursdays: 7-9 AM.  First 15 patients are seen on a first  come, first serve basis.    [place] Providers:  Organization         Address  Phone   Notes  [facility] [address], [place] [phone] Also accepts self-pay patients.  [facility] [address], [place]  [phone]   [place], [address], [place] [phone]   [facility] [address], [place] [phone]   Lucianne Lei [address], [place]   [phone] Only accepts [place] Access [place] patients after they have their name applied to their card.   Self-Pay (no insurance) in [employer]:  Organization         Address  Phone   Notes  Sickle Cell Patients, [facility] [place] [phone]   [facility] [place] [phone]   [facility] [place]  [place], [place], [place] [phone]   [facility]/Dr. Osei-Bonsu  [address] [place] or [address], [place] [phone] Phone number for both [place] and [place] locations is the same.  Urgent Medical and [facility] [address] [place] [phone]   [facility] [address] [place] or [address] [phone]   [facility] [address] [place] [phone], phone; [phone], fax Sees patients 1st and 3rd Saturday of every month.  Must not qualify for public or private insurance (i.e. Medicaid, Medicare, [place] Health Choice, Veterans' Benefits)  Household income should be no more than 200% of the poverty level The clinic cannot treat you if you are pregnant or think you are pregnant  Sexually transmitted diseases are not treated at the clinic.    Dental Care: Organization          Address  Phone  Notes  [facility] of [facility] [place] [phone] Accepts children up to age 31 who are enrolled in [place] or [place]; pregnant women with a Medicaid card; and children who have applied for Medicaid or [place] Health Choice, but were declined, whose parents can pay a reduced fee at time of service.  [facility] Department of [facility]  [address], [place] [phone] Accepts children up to age 70 who are enrolled in [place] or [place]; pregnant women with a Medicaid card; and children who have applied for Medicaid or [place] Health Choice, but were declined, whose parents can pay a reduced fee at time of service.  [facility] Adult Dental Access PROGRAM  [place], [place] [phone] Patients  are seen by appointment only. Walk-ins are not accepted. [place] will see patients 52 years of age and older. Monday - Tuesday (8am-5pm) Most Wednesdays (8:30-5pm) $30 per visit, cash only  [facility] Adult Dental Access PROGRAM  [address], [facility] [phone] Patients are seen by appointment only. Walk-ins are not accepted. [place] will see patients 31 years of age and older. One Wednesday Evening (Monthly: Volunteer Based).  $30 per visit, cash only  [place]  [phone] for adults; Children under age 35, call Graduate Pediatric Dentistry at [phone]. Children aged 54-14, please call [phone] to request a pediatric application.  Dental services are provided in all areas of dental care including fillings, crowns and bridges, complete and partial dentures, implants, gum treatment, root canals, and extractions. Preventive care is also provided. Treatment is provided to both adults and children. Patients are selected via a lottery and there is often a waiting list.   [facility] [address], [place]  [phone] [email or web address]   [facility] [address] [place], [place] [phone], Ext. [phone] Second and Fourth Thursday of each month, opens at 6:30 AM; Clinic ends at 9 AM.  Patients are seen on a first-come first-served basis, and a limited number are seen during each clinic.   [facility]  [address] Hillard Danker [place], [place] [phone]   Eligibility Requirements You must have lived in [place], [place], or [place] counties for at least the last three months.   You cannot be eligible for state or federal sponsored [employer], including [employer], [place], or [employer].   You generally cannot be eligible for healthcare insurance through your employer.    How to apply: Eligibility screenings are held every Tuesday and Wednesday afternoon from 1:00 pm until 4:00 pm. You do not need an appointment for the interview!  [facility] [address], [place], [place]   [place]  [place] Department  [place]  [phone]    Behavioral Health Resources in the Community: Intensive Outpatient Programs Organization         Address  Phone  Notes  Esmeralda Morehouse. [address], [place], [place] [phone]   [facility] Outpatient [address] [place], [place]   ADS: Alcohol & Drug Svcs [address] [place], [place]   [place] [address],  [place], [place] or [phone]   Substance Abuse Resources Organization         Address  Phone  Notes  Alcohol and Drug Services  [phone]   [place]  [phone]   [facility]   Chinita Pester  [phone]   Residential & Outpatient Substance Abuse Program  [phone]   Psychological  Services Organization         Address  Phone  Notes  [facility] [place]  [place]  [phone]   [place] [address] [place] or [phone]    Mobile Crisis Teams Organization         Address  Phone  Notes  [facility], Mobile Crisis Care Unit  [phone]   [facility] Psychotherapeutic Services  [address] [place], [place]   [place] [address], [place] 18  [place] [phone]    Self-Help/Support Groups Organization         Address  Phone             Notes  [facility]ental [facility]oc[facility] of [place] - variety of support groups  [place] Call for more information  Narcotics Anonymous (NA), [facility] [address], [employer] Callender  2 meetings at this location   Special educational needs teacher         Address  Phone  Notes  [facility] Residential Treatment [place],    [place]  [phone]   [facility]  [address], [place] [phone], [place], [place]   [place], [place] [phone] Admissions: 8am-3pm M-F  Incentives Substance [place] [address],    [place], [place] [phone]   [facility] [address] [place], [place], [place]   [facility] [address],  [place], [place]   [facility] Programs - Intensive Outpatient [address] Kristeen [address], [place], [place]   [facility] ([place] [place].,  [place], [place] [phone] or [phone]   [facility] Services (RTS) [address] [place], [place] Accepts Medicaid  [facility] [place] [address],  [place] [phone] Substance Abuse/Addiction Treatment   [facility] Organization         Address  Phone  Notes  [facility]  [phone]   Domenic Schwab, PhD [address] Arlis Porta [place], [place]   [phone] or [phone]   [place], [place] [phone]   [facility] [address], [place], [place] [phone] Insurance/Medicaid/sponsorship through [facility] and Families [address] [place]                                    [place], [place] [phone] [place] [address], [place] [phone]    Dr. Adele Schilder  [phone]   [facility] Dept. 1) [address], [place] 2) [place] 3)  [place] [address], [place] [phone]  [phone]   [place] [phone] or [phone] (After Hours)

## 2015-09-25 ENCOUNTER — Emergency Department (HOSPITAL_COMMUNITY)
Admission: EM | Admit: 2015-09-25 | Discharge: 2015-09-25 | Disposition: A | Discharge status: Home / Self Care | Payer: Self-pay | Attending: Emergency Medicine | Admitting: Emergency Medicine

## 2015-09-25 ENCOUNTER — Emergency Department (HOSPITAL_COMMUNITY): Payer: Self-pay

## 2015-09-25 DIAGNOSIS — Z973 Presence of spectacles and contact lenses: Secondary | ICD-10-CM | POA: Insufficient documentation

## 2015-09-25 DIAGNOSIS — Y998 Other external cause status: Secondary | ICD-10-CM | POA: Insufficient documentation

## 2015-09-25 DIAGNOSIS — S8391XA Sprain of unspecified site of right knee, initial encounter: Secondary | ICD-10-CM | POA: Insufficient documentation

## 2015-09-25 DIAGNOSIS — G43909 Migraine, unspecified, not intractable, without status migrainosus: Secondary | ICD-10-CM | POA: Insufficient documentation

## 2015-09-25 DIAGNOSIS — W108XXA Fall (on) (from) other stairs and steps, initial encounter: Secondary | ICD-10-CM | POA: Insufficient documentation

## 2015-09-25 DIAGNOSIS — M1990 Unspecified osteoarthritis, unspecified site: Secondary | ICD-10-CM | POA: Insufficient documentation

## 2015-09-25 DIAGNOSIS — G8929 Other chronic pain: Secondary | ICD-10-CM | POA: Insufficient documentation

## 2015-09-25 DIAGNOSIS — J45909 Unspecified asthma, uncomplicated: Secondary | ICD-10-CM | POA: Insufficient documentation

## 2015-09-25 DIAGNOSIS — F319 Bipolar disorder, unspecified: Secondary | ICD-10-CM | POA: Insufficient documentation

## 2015-09-25 DIAGNOSIS — E785 Hyperlipidemia, unspecified: Secondary | ICD-10-CM | POA: Insufficient documentation

## 2015-09-25 DIAGNOSIS — Z79899 Other long term (current) drug therapy: Secondary | ICD-10-CM | POA: Insufficient documentation

## 2015-09-25 DIAGNOSIS — Y9289 Other specified places as the place of occurrence of the external cause: Secondary | ICD-10-CM | POA: Insufficient documentation

## 2015-09-25 DIAGNOSIS — Y9389 Activity, other specified: Secondary | ICD-10-CM | POA: Insufficient documentation

## 2015-09-25 DIAGNOSIS — I10 Essential (primary) hypertension: Secondary | ICD-10-CM | POA: Insufficient documentation

## 2015-09-25 DIAGNOSIS — K219 Gastro-esophageal reflux disease without esophagitis: Secondary | ICD-10-CM | POA: Insufficient documentation

## 2015-09-25 DIAGNOSIS — F17210 Nicotine dependence, cigarettes, uncomplicated: Secondary | ICD-10-CM | POA: Insufficient documentation

## 2015-09-25 MED ORDER — TRAMADOL-ACETAMINOPHEN 37.5-325 MG PO TABS: 1.0000 | ORAL_TABLET | Freq: Four times a day (QID) | ORAL | Status: DC | PRN

## 2015-09-25 MED ORDER — TRAMADOL HCL 50 MG PO TABS
100.0000 mg | ORAL_TABLET | Freq: Once | ORAL | Status: AC
  Administered 2015-09-25: 100 mg via ORAL
  Filled 2015-09-25: qty 2

## 2015-09-25 NOTE — ED Notes (Addendum)
Patient is alert and oriented x4.  She is complaining of right knee pain that radiates up to her right Hip and has been increaseing In pain over the last few weeks.  Patient has a Hx of knee surgeries.,  Currently she rates her pain  10 of 10

## 2015-09-25 NOTE — ED Provider Notes (Signed)
CSN: [id number]     Arrival date & time 09/25/15  0610 History   First MD Initiated Contact with Patient 09/25/15 [phone]     Chief Complaint  Patient presents with  . Knee Pain     (Consider location/radiation/quality/duration/timing/severity/associated sxs/prior Treatment) HPI Patient has history of chronic knee pain. She reports she's had this since surgery that she had 2 years ago. She did however fall on the stairs 2 days ago and has increased swelling and pain. She reports is very tender to touch. He also reports she feels like there is numbness that comes out of her knee and goes both up towards her thigh as well as down towards her lower leg. Any movement is painful. She finds ice uncomfortable so she puts a heating pad on it. Past Medical History  Diagnosis Date  . Hypertension   . Migraine   . DDD (degenerative disc disease)   . Sciatica   . Osteoarthritis   . Asthma   . GERD (gastroesophageal reflux disease)   . Right ACL tear   . Right knee meniscal tear   . Bipolar 1 disorder (HCC)   . Depression   . Hyperlipidemia   . Chronic lumbar pain   . History of gastric ulcer   . Wears glasses    Past Surgical History  Procedure Laterality Date  . Bunionectomy Left 2011  . Heel spur surgery Right 2012  . Total abdominal hysterectomy w/ bilateral salpingoophorectomy  04-20-2006  . Knee arthroscopy with anterior cruciate ligament (acl) repair with hamstring graft Right 01/31/2014    Procedure: RIGHT KNEE ARTHROSCOPY WITH ALLOGRAFT (ACL) ANTERIOR CRUCIATE LIGAMENT RECONSTRUCTON PARTIAL MENISCECTOMY VERSES REPAIR ;  Surgeon: Eugenia Mcalpine, MD;  Location: [facility] [place];  Service: Orthopedics;  Laterality: Right;  . Abdominal hysterectomy     Family History  Problem Relation Age of Onset  . Hypertension Father    Social History  Substance Use Topics  . Smoking status: Current Every Day Smoker -- 0.50 packs/day for 32 years    Types: Cigarettes  . Smokeless  tobacco: Never Used  . Alcohol Use: 8.4 oz/week    14 Cans of beer per week     Comment: 4 40 ounce beers and liquor   OB History    No data available     Review of Systems Constitutional: No fevers or chills Respiratory: No cough or shortness of breath or chest pain   Allergies  Diphenhydramine hcl  Home Medications   Prior to Admission medications   Medication Sig Start Date End Date Taking? Authorizing Provider  Acetaminophen (PAIN RELIEF PO) Take 2 tablets by mouth every 2 (two) hours as needed (pain).   Yes Historical Provider, MD  albuterol (PROVENTIL HFA;VENTOLIN HFA) 108 (90 BASE) MCG/ACT inhaler Inhale 2 puffs into the lungs every 6 (six) hours as needed for wheezing or shortness of breath.   Yes Historical Provider, MD  amLODipine (NORVASC) 2.5 MG tablet Take 2.5 mg by mouth every morning.    Yes Historical Provider, MD  Aspirin-Salicylamide-Caffeine (BC HEADACHE POWDER PO) Take 1 Package by mouth every 2 (two) hours as needed (pain).   Yes Historical Provider, MD  famotidine (PEPCID) 40 MG tablet Take 1 tablet (40 mg total) by mouth at bedtime. Patient taking differently: Take 40 mg by mouth at bedtime as needed for heartburn or indigestion.  08/03/15  Yes Ace Gins Sam, PA-C  gabapentin (NEURONTIN) 300 MG capsule Take 300 mg by mouth 3 (three) times daily as  needed (for pain).    Yes Historical Provider, MD  lisinopril-hydrochlorothiazide (PRINZIDE,ZESTORETIC) 20-12.5 MG per tablet Take 1 tablet by mouth every morning.    Yes Historical Provider, MD  meloxicam (MOBIC) 15 MG tablet Take 15 mg by mouth daily.   Yes Historical Provider, MD  omeprazole (PRILOSEC) 40 MG capsule Take 1 capsule (40 mg total) by mouth daily. Patient taking differently: Take 40 mg by mouth daily as needed (heartburn/ acid reflux).  08/03/15  Yes Ace Gins Sam, PA-C  pantoprazole (PROTONIX) 20 MG tablet Take 2 tablets (40 mg total) by mouth daily. Patient taking differently: Take 40 mg by mouth daily  as needed for heartburn or indigestion.  06/07/15  Yes Alvira Monday, MD  pravastatin (PRAVACHOL) 40 MG tablet Take 40 mg by mouth every morning.    Yes Historical Provider, MD  sucralfate (CARAFATE) 1 G tablet Take 1 tablet (1 g total) by mouth 2 (two) times daily. 08/03/15  Yes Ace Gins Sam, PA-C  dicyclomine (BENTYL) 20 MG tablet Take 1 tablet (20 mg total) by mouth 2 (two) times daily. Patient not taking: Reported on 09/25/2015 06/07/15   Alvira Monday, MD  dicyclomine (BENTYL) 20 MG tablet Take 1 tablet (20 mg total) by mouth 2 (two) times daily. Patient not taking: Reported on 09/25/2015 08/03/15   Ace Gins Sam, PA-C  traMADol-acetaminophen (ULTRACET) 37.5-325 MG tablet Take 1-2 tablets by mouth every 6 (six) hours as needed. 09/25/15   Arby Barrette, MD   BP 125/92 mmHg  Pulse 78  Temp(Src) 98 F (36.7 C) (Oral)  Resp 18  Ht  (1.626 m)  Wt 155 lb (70.308 kg)  BMI 26.59 kg/m2  SpO2 98% Physical Exam  Constitutional: She is oriented to person, place, and time.  Patient is alert and nontoxic. She is well in appearance.  HENT:  Head: Normocephalic and atraumatic.  Eyes: EOM are normal.  Pulmonary/Chest: Effort normal.  Musculoskeletal:  The patient appears to have mild effusion of the right knee. Patellar contours are still easily discernible. No erythema or warmth. She endorses severe pain to pressure directly over the patella. She also endorses pain along the lateral and medial joint lines. No edema of the lower leg or calf. Patient can flex and extend the knee however it is very painful for her.  Neurological: She is alert and oriented to person, place, and time. Coordination normal.  Skin: Skin is warm and dry.  Psychiatric: She has a normal mood and affect.    ED Course  Procedures (including critical care time) Labs Review Labs Reviewed - No data to display  Imaging Review Dg Knee Complete 4 Views Right  09/25/2015  CLINICAL DATA:  Recent falls, right knee pain,  history of prior ACL reconstruction EXAM: RIGHT KNEE - COMPLETE 4+ VIEW COMPARISON:  04/27/2013 FINDINGS: Postop changes from ACL reconstruction. Normal alignment. No acute osseous finding or fracture. Possible small joint effusion on the lateral view. Minor bony spurring of the medial compartment. Relatively preserved joint spaces. No soft tissue abnormality. IMPRESSION: Previous Biochemist, clinical. No acute osseous finding or fracture. Electronically Signed   By: Judie Petit.  Shick M.D.   On: 09/25/2015 08:15   I have personally reviewed and evaluated these images and lab results as part of my medical decision-making.   EKG Interpretation None      MDM   Final diagnoses:  Knee sprain, right, initial encounter   Patient has chronic knee pain. She has fallen within the past 2 days and developed  worsening pain. X-ray shows no acute fractures. She does have very small effusion. No erythema. This time instructions are for elevating Ace compression and icing. She reports she will not be icing because she finds uncomfortable. She is counseled to follow-up with her orthopedic doctor. Ultracet given for pain control. The patient does have history of chronic alcoholism and thus no NSAIDs or used due to history of gastritis.    Arby Barrette, MD 09/25/15 [phone]

## 2015-09-25 NOTE — Discharge Instructions (Signed)

## 2016-02-17 ENCOUNTER — Encounter (HOSPITAL_COMMUNITY): Payer: Self-pay | Admitting: Emergency Medicine

## 2016-02-17 ENCOUNTER — Emergency Department (HOSPITAL_COMMUNITY)
Admission: EM | Admit: 2016-02-17 | Discharge: 2016-02-18 | Disposition: A | Discharge status: Home / Self Care | Payer: Medicaid Other | Attending: Emergency Medicine | Admitting: Emergency Medicine

## 2016-02-17 DIAGNOSIS — M25552 Pain in left hip: Secondary | ICD-10-CM | POA: Insufficient documentation

## 2016-02-17 DIAGNOSIS — Z5321 Procedure and treatment not carried out due to patient leaving prior to being seen by health care provider: Secondary | ICD-10-CM | POA: Insufficient documentation

## 2016-02-17 NOTE — ED Notes (Addendum)
Pt transported via [facility] for L hip pain, chronic in nature. Pt reports being "drunk" to EMS Pt reports pain to L hip radiating down L leg for 41mo to 9331yrs. Pt states she believes this to be arthritis or sciatica. Pt takes no meds for pain, does drink etoh and use marijuana for the pain. Pt reports she is unable to sleep past 3am d/t pain

## 2016-02-18 NOTE — ED Notes (Signed)
Pt stated to writer that she was leaving due to pt's ride was here and she's ready to go.

## 2016-03-31 ENCOUNTER — Emergency Department (HOSPITAL_COMMUNITY): Payer: Self-pay

## 2016-03-31 ENCOUNTER — Emergency Department (HOSPITAL_COMMUNITY)
Admission: EM | Admit: 2016-03-31 | Discharge: 2016-03-31 | Disposition: A | Discharge status: Home / Self Care | Payer: Self-pay | Attending: Emergency Medicine | Admitting: Emergency Medicine

## 2016-03-31 ENCOUNTER — Encounter (HOSPITAL_COMMUNITY): Payer: Self-pay | Admitting: Emergency Medicine

## 2016-03-31 DIAGNOSIS — I10 Essential (primary) hypertension: Secondary | ICD-10-CM | POA: Insufficient documentation

## 2016-03-31 DIAGNOSIS — F1290 Cannabis use, unspecified, uncomplicated: Secondary | ICD-10-CM | POA: Insufficient documentation

## 2016-03-31 DIAGNOSIS — N12 Tubulo-interstitial nephritis, not specified as acute or chronic: Secondary | ICD-10-CM | POA: Insufficient documentation

## 2016-03-31 DIAGNOSIS — Z791 Long term (current) use of non-steroidal anti-inflammatories (NSAID): Secondary | ICD-10-CM | POA: Insufficient documentation

## 2016-03-31 DIAGNOSIS — J45909 Unspecified asthma, uncomplicated: Secondary | ICD-10-CM | POA: Insufficient documentation

## 2016-03-31 DIAGNOSIS — Z79899 Other long term (current) drug therapy: Secondary | ICD-10-CM | POA: Insufficient documentation

## 2016-03-31 DIAGNOSIS — F17210 Nicotine dependence, cigarettes, uncomplicated: Secondary | ICD-10-CM | POA: Insufficient documentation

## 2016-03-31 DIAGNOSIS — R109 Unspecified abdominal pain: Secondary | ICD-10-CM

## 2016-03-31 LAB — CBC
HCT: 33.2 % — ABNORMAL LOW (ref 36.0–46.0)
Hemoglobin: 11.5 g/dL — ABNORMAL LOW (ref 12.0–15.0)
MCH: 32 pg (ref 26.0–34.0)
MCHC: 34.6 g/dL (ref 30.0–36.0)
MCV: 92.5 fL (ref 78.0–100.0)
Platelets: 321 10*3/uL (ref 150–400)
RBC: 3.59 MIL/uL — ABNORMAL LOW (ref 3.87–5.11)
RDW: 12.4 % (ref 11.5–15.5)
WBC: 6.5 10*3/uL (ref 4.0–10.5)

## 2016-03-31 LAB — COMPREHENSIVE METABOLIC PANEL
ALT: 13 U/L — ABNORMAL LOW (ref 14–54)
AST: 23 U/L (ref 15–41)
Albumin: 3.9 g/dL (ref 3.5–5.0)
Alkaline Phosphatase: 45 U/L (ref 38–126)
Anion gap: 4 — ABNORMAL LOW (ref 5–15)
BUN: 15 mg/dL (ref 6–20)
CO2: 29 mmol/L (ref 22–32)
Calcium: 9.3 mg/dL (ref 8.9–10.3)
Chloride: 104 mmol/L (ref 101–111)
Creatinine, Ser: 0.78 mg/dL (ref 0.44–1.00)
GFR calc Af Amer: >60 mL/min (ref >60)
GFR calc non Af Amer: >60 mL/min (ref >60)
Glucose, Bld: 97 mg/dL (ref 65–99)
Potassium: 3.6 mmol/L (ref 3.5–5.1)
Sodium: 137 mmol/L (ref 135–145)
Total Bilirubin: 0.4 mg/dL (ref 0.3–1.2)
Total Protein: 7.8 g/dL (ref 6.5–8.1)

## 2016-03-31 LAB — URINALYSIS, ROUTINE W REFLEX MICROSCOPIC
Bilirubin Urine: NEGATIVE
Glucose, UA: NEGATIVE mg/dL
Hgb urine dipstick: NEGATIVE
Ketones, ur: NEGATIVE mg/dL
Nitrite: NEGATIVE
Protein, ur: NEGATIVE mg/dL
Specific Gravity, Urine: 1.013 (ref 1.005–1.030)
pH: 6.5 (ref 5.0–8.0)

## 2016-03-31 LAB — LIPASE, BLOOD: Lipase: 37 U/L (ref 11–51)

## 2016-03-31 LAB — URINE MICROSCOPIC-ADD ON

## 2016-03-31 MED ORDER — MORPHINE SULFATE (PF) 4 MG/ML IV SOLN
4.0000 mg | Freq: Once | INTRAVENOUS | Status: AC
  Administered 2016-03-31: 4 mg via INTRAVENOUS
  Filled 2016-03-31: qty 1

## 2016-03-31 MED ORDER — KETOROLAC TROMETHAMINE 30 MG/ML IJ SOLN
30.0000 mg | Freq: Once | INTRAMUSCULAR | Status: AC
  Administered 2016-03-31: 30 mg via INTRAVENOUS
  Filled 2016-03-31: qty 1

## 2016-03-31 MED ORDER — CEPHALEXIN 500 MG PO CAPS: 500.0000 mg | ORAL_CAPSULE | Freq: Three times a day (TID) | ORAL | 0 refills | Status: DC

## 2016-03-31 MED ORDER — ONDANSETRON HCL 4 MG/2ML IJ SOLN
4.0000 mg | Freq: Once | INTRAMUSCULAR | Status: AC
  Administered 2016-03-31: 4 mg via INTRAVENOUS
  Filled 2016-03-31: qty 2

## 2016-03-31 NOTE — Discharge Instructions (Signed)
Your right flank pain may be musculoskeletal pain, but your urine also looks mildly infected and can be causing a kidney infection. Take antibiotics as prescribed. Take ibuprofen and use ice/heat packs.  Return for worsening symptoms, including fever, intractable vomiting, escalating pain or any other symptoms concerning to you.

## 2016-03-31 NOTE — ED Triage Notes (Signed)
Patient states having right side pain x 3 days that has been constant. Patient denies any urinary problems.  Patient states that pain is worse with movement.

## 2016-03-31 NOTE — ED Provider Notes (Signed)
[facility]-EMERGENCY DEPT Provider Note   CSN: [id number] Arrival date & time: 03/31/16  [id number]     History   Chief Complaint No chief complaint on file.   HPI [name] [name] is a 50 y.o. female.  HPI 50 year old female who presents with right flank pain. She has a history of tubal ligation, abdominal hysterectomy, peptic ulcer disease, and chronic low back pain. States 2 days ago with onset of sharp interment and right flank pain, worse with movement. Has not had similar pain before. Associated with nausea, no vomiting, no diarrhea or constipation. No fevers or chills. No dysuria, urinary frequency, hematuria. The pain is not worsened or exacerbated by eating. No melena or hematochezia. Past Medical History:  Diagnosis Date  . Asthma   . Bipolar 1 disorder (HCC)   . Chronic lumbar pain   . DDD (degenerative disc disease)   . Depression   . GERD (gastroesophageal reflux disease)   . History of gastric ulcer   . Hyperlipidemia   . Hypertension   . Migraine   . Osteoarthritis   . Right ACL tear   . Right knee meniscal tear   . Sciatica   . Wears glasses     Patient Active Problem List   Diagnosis Date Noted  . S/P ACL reconstruction 01/31/2014  . DEPRESSION 11/17/2008  . ACUTE SINUSITIS, UNSPECIFIED 11/17/2008  . ALLERGIC RHINITIS 11/17/2008  . GERD 11/17/2008  . HEADACHE 11/17/2008    Past Surgical History:  Procedure Laterality Date  . ABDOMINAL HYSTERECTOMY    . BUNIONECTOMY Left 2011  . HEEL SPUR SURGERY Right 2012  . KNEE ARTHROSCOPY WITH ANTERIOR CRUCIATE LIGAMENT (ACL) REPAIR WITH HAMSTRING GRAFT Right 01/31/2014   Procedure: RIGHT KNEE ARTHROSCOPY WITH ALLOGRAFT (ACL) ANTERIOR CRUCIATE LIGAMENT RECONSTRUCTON PARTIAL MENISCECTOMY VERSES REPAIR ;  Surgeon: Eugenia Mcalpineobert Collins, MD;  [facility];  Service: Orthopedics;  Laterality: Right;  . TOTAL ABDOMINAL HYSTERECTOMY W/ BILATERAL SALPINGOOPHORECTOMY  04-20-2006    OB History    No data  available       Home Medications    Prior to Admission medications   Medication Sig Start Date End Date Taking? Authorizing Provider  Acetaminophen (PAIN RELIEF PO) Take 2 tablets by mouth every 2 (two) hours as needed (pain).   Yes Historical Provider, MD  albuterol (PROVENTIL HFA;VENTOLIN HFA) 108 (90 BASE) MCG/ACT inhaler Inhale 2 puffs into the lungs every 6 (six) hours as needed for wheezing or shortness of breath.   Yes Historical Provider, MD  amLODipine (NORVASC) 2.5 MG tablet Take 2.5 mg by mouth every morning.    Yes Historical Provider, MD  cholecalciferol (VITAMIN D) 1000 units tablet Take 1,000 Units by mouth daily.   Yes Historical Provider, MD  famotidine (PEPCID) 40 MG tablet Take 1 tablet (40 mg total) by mouth at bedtime. Patient taking differently: Take 40 mg by mouth at bedtime as needed for heartburn or indigestion.  08/03/15  Yes Ace GinsSerena Y Sam, PA-C  gabapentin (NEURONTIN) 300 MG capsule Take 300 mg by mouth 3 (three) times daily as needed (for pain).    Yes Historical Provider, MD  ibuprofen (ADVIL,MOTRIN) 200 MG tablet Take 400 mg by mouth every 6 (six) hours as needed for mild pain or moderate pain.   Yes Historical Provider, MD  lisinopril-hydrochlorothiazide (PRINZIDE,ZESTORETIC) 20-12.5 MG per tablet Take 1 tablet by mouth every morning.    Yes Historical Provider, MD  meloxicam (MOBIC) 15 MG tablet Take 15 mg by mouth daily.   Yes  Historical Provider, MD  Multiple Vitamins-Minerals (MULTIVITAMIN ADULT PO) Take 1 tablet by mouth daily.   Yes Historical Provider, MD  omeprazole (PRILOSEC) 40 MG capsule Take 1 capsule (40 mg total) by mouth daily. Patient taking differently: Take 40 mg by mouth daily as needed (heartburn/ acid reflux).  08/03/15  Yes Ace Gins Sam, PA-C  pantoprazole (PROTONIX) 20 MG tablet Take 2 tablets (40 mg total) by mouth daily. Patient taking differently: Take 40 mg by mouth daily as needed for heartburn or indigestion.  06/07/15  Yes Alvira Monday, MD  cephALEXin (KEFLEX) 500 MG capsule Take 1 capsule (500 mg total) by mouth 3 (three) times daily. 03/31/16   Lavera Guise, MD  traMADol-acetaminophen (ULTRACET) 37.5-325 MG tablet Take 1-2 tablets by mouth every 6 (six) hours as needed. Patient not taking: Reported on 02/17/2016 09/25/15   Arby Barrette, MD    Family History Family History  Problem Relation Age of Onset  . Hypertension Father     Social History Social History  Substance Use Topics  . Smoking status: Current Every Day Smoker    Packs/day: 0.50    Years: 32.00    Types: Cigarettes  . Smokeless tobacco: Never Used  . Alcohol use 8.4 oz/week    14 Cans of beer per week     Comment: 4 40 ounce beers and liquor     Allergies   Diphenhydramine hcl   Review of Systems Review of Systems 10/14 systems reviewed and are negative other than those stated in the HPI   Physical Exam Updated Vital Signs BP (!) 171/101 (BP Location: Left Arm)   Pulse 78   Temp 97.9 F (36.6 C) (Oral)   Resp 19   Ht 5\' 4"  (1.626 m)   Wt 150 lb (68 kg)   SpO2 100%   BMI 25.75 kg/m   Physical Exam Physical Exam  Nursing note and vitals reviewed. Constitutional: Well developed, well nourished, non-toxic, and in no acute distress Head: Normocephalic and atraumatic.  Mouth/Throat: Oropharynx is clear and moist.  Neck: Normal range of motion. Neck supple.  Cardiovascular: Normal rate and regular rhythm.   Pulmonary/Chest: Effort normal and breath sounds normal.  Abdominal: Soft. There is minimal RUQ tenderness. No Murphy's sign. There is no rebound and no guarding. Right CVA tenderness. Musculoskeletal: Normal range of motion.  Neurological: Alert, no facial droop, fluent speech, moves all extremities symmetrically Skin: Skin is warm and dry.  Psychiatric: Cooperative   ED Treatments / Results  Labs (all labs ordered are listed, but only abnormal results are displayed) Labs Reviewed  COMPREHENSIVE METABOLIC  PANEL - Abnormal; Notable for the following:       Result Value   ALT 13 (*)    Anion gap 4 (*)    All other components within normal limits  CBC - Abnormal; Notable for the following:    RBC 3.59 (*)    Hemoglobin 11.5 (*)    HCT 33.2 (*)    All other components within normal limits  URINALYSIS, ROUTINE W REFLEX MICROSCOPIC (NOT AT [facility]) - Abnormal; Notable for the following:    APPearance CLOUDY (*)    Leukocytes, UA MODERATE (*)    All other components within normal limits  URINE MICROSCOPIC-ADD ON - Abnormal; Notable for the following:    Squamous Epithelial / LPF 6-30 (*)    Bacteria, UA MANY (*)    All other components within normal limits  URINE CULTURE  LIPASE, BLOOD    EKG  EKG Interpretation None       Radiology Ct Renal Stone Study  Result Date: 03/31/2016 CLINICAL DATA:  Right side abdominal pain for 5 days EXAM: CT ABDOMEN AND PELVIS WITHOUT CONTRAST TECHNIQUE: Multidetector CT imaging of the abdomen and pelvis was performed following the standard protocol without IV contrast. COMPARISON:  06/07/2015 FINDINGS: The lung bases are unremarkable. Unenhanced liver shows no biliary ductal dilatation. No calcified gallstones are noted within gallbladder. Unenhanced pancreas is unremarkable. Unenhanced spleen is unremarkable. No adrenal gland mass. Unenhanced kidneys are symmetrical in size. No nephrolithiasis. No hydronephrosis or hydroureter. No calcified ureteral calculi are noted. No calcified calculi are noted within urinary bladder. No thickening of urinary bladder wall. Bilateral distal ureter is unremarkable. No small bowel obstruction. The study is limited without IV or oral contrast. The terminal ileum is unremarkable. Normal appendix is noted in axial image 59. Some colonic stool noted in distal sigmoid colon and rectum. No colitis or diverticulitis. No mesenteric fluid collection. No ascites or free abdominal air. No aortic aneurysm.  No retroperitoneal or mesenteric  adenopathy. No destructive bony lesions are noted. Sagittal images of the spine shows significant disc space flattening with vacuum disc phenomenon mild posterior disc bulge at L5-S1 level. Mild degenerative changes lower thoracic spine. IMPRESSION: 1. No nephrolithiasis.  No hydronephrosis or hydroureter. 2. No calcified ureteral calculi. 3. Normal appendix.  No pericecal inflammation. 4. Mild degenerative changes thoracolumbar spine. 5. No small bowel obstruction. Electronically Signed   By: Natasha Mead M.D.   On: 03/31/2016 09:53    Procedures Procedures (including critical care time)  Medications Ordered in ED Medications  morphine 4 MG/ML injection 4 mg (4 mg Intravenous Given 03/31/16 0911)  ondansetron (ZOFRAN) injection 4 mg (4 mg Intravenous Given 03/31/16 0911)  ketorolac (TORADOL) 30 MG/ML injection 30 mg (30 mg Intravenous Given 03/31/16 1002)     Initial Impression / Assessment and Plan / ED Course  I have reviewed the triage vital signs and the nursing notes.  Pertinent labs & imaging results that were available during my care of the patient were reviewed by me and considered in my medical decision making (see chart for details).  Clinical Course    Presenting with 2-3 days of right flank pain. Is well-appearing in no acute distress. Afebrile, hemodynamically stable. Blood work with normal renal function, no leukocytosis. Abdomen is soft and benign, and she does have right flank tenderness to palpation as well as with percussion. UA with moderate leukocytes, bacteria, WBCs that could be suggestive of urinary tract infection and pyelonephritis. CT w/o contrast negative for kidney stones or other acute intraabdominal processes. Pain also musculoskeletal in nature and is reproducible with palpation of the muscles. I will treat with course of antibiotics as outpatient. She will continue over-the-counter medications ice and heat packs. Strict return and follow-up instructions reviewed. She  expressed understanding of all discharge instructions and felt comfortable with the plan of care.   Final Clinical Impressions(s) / ED Diagnoses   Final diagnoses:  Right flank pain  Pyelonephritis    New Prescriptions New Prescriptions   CEPHALEXIN (KEFLEX) 500 MG CAPSULE    Take 1 capsule (500 mg total) by mouth 3 (three) times daily.     Lavera Guise, MD 03/31/16 1048

## 2016-04-02 LAB — URINE CULTURE: Culture: >=100000 — AB

## 2016-04-03 ENCOUNTER — Telehealth (HOSPITAL_COMMUNITY): Payer: Self-pay

## 2016-04-03 NOTE — Telephone Encounter (Signed)
Post ED Visit - Positive Culture Follow-up  Culture report reviewed by antimicrobial stewardship pharmacist:  []  [name] [name], Pharm.D. []  [name] [name], Pharm.D., BCPS []  [name] [name], Pharm.D. []  [name] [name], [address] BCPS []  [name] [name], [address] BCPS, AAHIVP []  [name] [name], Pharm.D., BCPS, AAHIVP []  [name] [name], Pharm.D. []  [name] [name], [address] Bernie CoveyX  [name] [name], Pharm.D.  Positive urine culture, >/= 100,000 colonies -> E Coli Treated with Cephalexin, organism sensitive to the same and no further patient follow-up is required at this time.   Arvid RightClark, Anise Harbin Dorn 04/03/2016, 11:31 AM

## 2016-07-27 ENCOUNTER — Encounter (HOSPITAL_COMMUNITY): Payer: Self-pay | Admitting: Emergency Medicine

## 2016-07-27 ENCOUNTER — Emergency Department (HOSPITAL_COMMUNITY)
Admission: EM | Admit: 2016-07-27 | Discharge: 2016-07-27 | Disposition: A | Discharge status: Home / Self Care | Payer: Medicaid Other | Attending: Emergency Medicine | Admitting: Emergency Medicine

## 2016-07-27 DIAGNOSIS — J029 Acute pharyngitis, unspecified: Secondary | ICD-10-CM | POA: Insufficient documentation

## 2016-07-27 DIAGNOSIS — F17210 Nicotine dependence, cigarettes, uncomplicated: Secondary | ICD-10-CM | POA: Insufficient documentation

## 2016-07-27 DIAGNOSIS — I10 Essential (primary) hypertension: Secondary | ICD-10-CM | POA: Insufficient documentation

## 2016-07-27 DIAGNOSIS — J45909 Unspecified asthma, uncomplicated: Secondary | ICD-10-CM | POA: Insufficient documentation

## 2016-07-27 DIAGNOSIS — Z79899 Other long term (current) drug therapy: Secondary | ICD-10-CM | POA: Insufficient documentation

## 2016-07-27 LAB — RAPID STREP SCREEN (MED CTR MEBANE ONLY): Streptococcus, Group A Screen (Direct): NEGATIVE

## 2016-07-27 MED ORDER — DEXAMETHASONE SODIUM PHOSPHATE 10 MG/ML IJ SOLN
10.0000 mg | Freq: Once | INTRAMUSCULAR | Status: DC
  Filled 2016-07-27: qty 1

## 2016-07-27 MED ORDER — HYDROCODONE-ACETAMINOPHEN 7.5-325 MG/15ML PO SOLN: 15.0000 mL | Freq: Three times a day (TID) | ORAL | 0 refills | Status: DC | PRN

## 2016-07-27 MED ORDER — IBUPROFEN 600 MG PO TABS: 600.0000 mg | ORAL_TABLET | Freq: Four times a day (QID) | ORAL | 0 refills | Status: DC | PRN

## 2016-07-27 MED ORDER — PREDNISONE 20 MG PO TABS
60.0000 mg | ORAL_TABLET | Freq: Once | ORAL | Status: AC
  Administered 2016-07-27: 60 mg via ORAL
  Filled 2016-07-27: qty 3

## 2016-07-27 MED ORDER — IBUPROFEN 200 MG PO TABS
600.0000 mg | ORAL_TABLET | Freq: Once | ORAL | Status: AC
  Administered 2016-07-27: 600 mg via ORAL
  Filled 2016-07-27: qty 3

## 2016-07-27 NOTE — Discharge Instructions (Signed)
Take your medications as prescribed as needed for pain relief. Continue drinking fluids at home to remain hydrated.  Please follow up with a primary care provider from the Resource Guide provided below in 4-5 days if your symptoms have not improved. Please return to the Emergency Department if symptoms worsen or new onset of facial or neck swelling, unable to open your jaw, unable to swallow, difficulty breathing, drooling, vomiting, unable to keep fluids down.

## 2016-07-27 NOTE — ED Triage Notes (Signed)
Patient reports right sided throat soreness and painful/trouble swallowing x 3 days.

## 2016-07-27 NOTE — ED Provider Notes (Signed)
[facility]-EMERGENCY DEPT Provider Note   CSN: [id number] Arrival date & time: 07/27/16  1159   By signing my name below, I, [name] [name], attest that this documentation has been prepared under the direction and in the presence of [name], [place]. Electronically Signed: Teofilo [name], ED Scribe. 07/27/2016. 12:37 PM.   History   Chief Complaint Chief Complaint  Patient presents with  . Sore Throat    The history is provided by the patient. No language interpreter was used.   HPI Comments:  [name] [name] is a 50 y.o. female with PMHx of HTN who presents to the Emergency Department complaining of a worsening, right-sided sore throat x 2 days. Pt complains of associated pain with swallowing, right ear pain, rhinorrhea, nasal congestion, drooling at night, productive cough, and right sided neck swelling. Pt states that she has been spitting up her saliva for 3 days because it is too painful to swallow. Pt has been able to drink fluids but with significant pain. Pt has 16 grandchildren and reports sick contacts (kids with cold). No alleviating factors noted. Pt denies fever, SOB, facial swelling, trismus, muffled voice.   Past Medical History:  Diagnosis Date  . Asthma   . Bipolar 1 disorder (HCC)   . Chronic lumbar pain   . DDD (degenerative disc disease)   . Depression   . GERD (gastroesophageal reflux disease)   . History of gastric ulcer   . Hyperlipidemia   . Hypertension   . Migraine   . Osteoarthritis   . Right ACL tear   . Right knee meniscal tear   . Sciatica   . Wears glasses     Patient Active Problem List   Diagnosis Date Noted  . S/P ACL reconstruction 01/31/2014  . DEPRESSION 11/17/2008  . ACUTE SINUSITIS, UNSPECIFIED 11/17/2008  . ALLERGIC RHINITIS 11/17/2008  . GERD 11/17/2008  . HEADACHE 11/17/2008    Past Surgical History:  Procedure Laterality Date  . ABDOMINAL HYSTERECTOMY    . BUNIONECTOMY Left 2011  . HEEL SPUR SURGERY Right 2012    . KNEE ARTHROSCOPY WITH ANTERIOR CRUCIATE LIGAMENT (ACL) REPAIR WITH HAMSTRING GRAFT Right 01/31/2014   Procedure: RIGHT KNEE ARTHROSCOPY WITH ALLOGRAFT (ACL) ANTERIOR CRUCIATE LIGAMENT RECONSTRUCTON PARTIAL MENISCECTOMY VERSES REPAIR ;  Surgeon: Eugenia Mcalpine, MD;  Location: [facility] ;  Service: Orthopedics;  Laterality: Right;  . TOTAL ABDOMINAL HYSTERECTOMY W/ BILATERAL SALPINGOOPHORECTOMY  04-20-2006    OB History    No data available       Home Medications    Prior to Admission medications   Medication Sig Start Date End Date Taking? Authorizing Provider  Acetaminophen (PAIN RELIEF PO) Take 2 tablets by mouth every 2 (two) hours as needed (pain).    Historical Provider, MD  albuterol (PROVENTIL HFA;VENTOLIN HFA) 108 (90 BASE) MCG/ACT inhaler Inhale 2 puffs into the lungs every 6 (six) hours as needed for wheezing or shortness of breath.    Historical Provider, MD  amLODipine (NORVASC) 2.5 MG tablet Take 2.5 mg by mouth every morning.     Historical Provider, MD  cephALEXin (KEFLEX) 500 MG capsule Take 1 capsule (500 mg total) by mouth 3 (three) times daily. 03/31/16   Lavera Guise, MD  cholecalciferol (VITAMIN D) 1000 units tablet Take 1,000 Units by mouth daily.    Historical Provider, MD  famotidine (PEPCID) 40 MG tablet Take 1 tablet (40 mg total) by mouth at bedtime. Patient taking differently: Take 40 mg by mouth at bedtime  as needed for heartburn or indigestion.  08/03/15   Ace GinsSerena Y Sam, PA-C  gabapentin (NEURONTIN) 300 MG capsule Take 300 mg by mouth 3 (three) times daily as needed (for pain).     Historical Provider, MD  HYDROcodone-acetaminophen (HYCET) 7.5-325 mg/15 ml solution Take 15 mLs by mouth every 8 (eight) hours as needed for moderate pain. 07/27/16   Barrett HenleNicole Elizabeth Nadeau, PA-C  ibuprofen (ADVIL,MOTRIN) 600 MG tablet Take 1 tablet (600 mg total) by mouth every 6 (six) hours as needed. 07/27/16   Barrett HenleNicole Elizabeth Nadeau, PA-C   lisinopril-hydrochlorothiazide (PRINZIDE,ZESTORETIC) 20-12.5 MG per tablet Take 1 tablet by mouth every morning.     Historical Provider, MD  meloxicam (MOBIC) 15 MG tablet Take 15 mg by mouth daily.    Historical Provider, MD  Multiple Vitamins-Minerals (MULTIVITAMIN ADULT PO) Take 1 tablet by mouth daily.    Historical Provider, MD  omeprazole (PRILOSEC) 40 MG capsule Take 1 capsule (40 mg total) by mouth daily. Patient taking differently: Take 40 mg by mouth daily as needed (heartburn/ acid reflux).  08/03/15   Ace GinsSerena Y Sam, PA-C  pantoprazole (PROTONIX) 20 MG tablet Take 2 tablets (40 mg total) by mouth daily. Patient taking differently: Take 40 mg by mouth daily as needed for heartburn or indigestion.  06/07/15   Alvira MondayErin Schlossman, MD  traMADol-acetaminophen (ULTRACET) 37.5-325 MG tablet Take 1-2 tablets by mouth every 6 (six) hours as needed. Patient not taking: Reported on 02/17/2016 09/25/15   Arby BarretteMarcy Pfeiffer, MD    Family History Family History  Problem Relation Age of Onset  . Hypertension Father     Social History Social History  Substance Use Topics  . Smoking status: Current Every Day Smoker    Packs/day: 0.50    Years: 32.00    Types: Cigarettes  . Smokeless tobacco: Never Used  . Alcohol use 8.4 oz/week    14 Cans of beer per week     Comment: 4 40 ounce beers and liquor     Allergies   Diphenhydramine hcl   Review of Systems Review of Systems  Constitutional: Negative for fever.  HENT: Positive for congestion, drooling, ear pain, rhinorrhea and sore throat.   Respiratory: Positive for cough. Negative for shortness of breath.      Physical Exam Updated Vital Signs BP 139/95 (BP Location: Left Arm)   Pulse 85   Temp 98.4 F (36.9 C) (Oral)   Resp 18   Ht 5\' 4"  (1.626 m)   Wt 68 kg   SpO2 100%   BMI 25.75 kg/m   Physical Exam  Constitutional: She is oriented to person, place, and time. She appears well-developed and well-nourished.  HENT:  Head:  Normocephalic and atraumatic.  Mouth/Throat: Uvula is midline and mucous membranes are normal. No trismus in the jaw. No uvula swelling. Posterior oropharyngeal erythema present. No oropharyngeal exudate, posterior oropharyngeal edema or tonsillar abscesses. Tonsils are 2+ on the right. Tonsils are 2+ on the left. No tonsillar exudate.  No facial or neck swelling. Floor of mouth soft. Pt spitting into emesis bag during exam but able to swallow saliva when prompted. No trismus.   Eyes: Conjunctivae and EOM are normal. Right eye exhibits no discharge. Left eye exhibits no discharge. No scleral icterus.  Neck: Normal range of motion. Neck supple.  Cardiovascular: Normal rate and intact distal pulses.   Pulmonary/Chest: Effort normal. No respiratory distress.  Lymphadenopathy:    She has cervical adenopathy (bilateral submandibular).  Neurological: She is alert and oriented  to person, place, and time.  Skin: Skin is warm and dry.  Nursing note and vitals reviewed.    ED Treatments / Results  DIAGNOSTIC STUDIES:  Oxygen Saturation is 100% on RA, normal by my interpretation.    COORDINATION OF CARE:  12:37 PM Discussed treatment plan with pt at bedside and pt agreed to plan.   Labs (all labs ordered are listed, but only abnormal results are displayed) Labs Reviewed  RAPID STREP SCREEN (NOT AT [facility])  CULTURE, GROUP A STREP [employer]([facility])    EKG  EKG Interpretation None       Radiology No results found.  Procedures Procedures (including critical care time)  Medications Ordered in ED Medications  ibuprofen (ADVIL,MOTRIN) tablet 600 mg (600 mg Oral Given 07/27/16 1248)  predniSONE (DELTASONE) tablet 60 mg (60 mg Oral Given 07/27/16 1248)     Initial Impression / Assessment and Plan / ED Course  I have reviewed the triage vital signs and the nursing notes.  Pertinent labs & imaging results that were available during my care of the patient were reviewed by me and considered in my  medical decision making (see chart for details).  Clinical Course     Pt presents with sore throat and pain with swallowing. Reports she has been around her grandkids recently who have been sick with cold recently. VSS. Exam revealed erythematous posterior oropharynx without swelling or tonsillar exudate. Uvula midline, no trismus, no drooling. Pt given steroids and NSAIDs in the ED. Pt tolerating PO. Strep negative. Suspect viral pharyngitis. Plan to d/c home with symptomatic tx and PCP follow up as needed. Discussed return precautions.   Final Clinical Impressions(s) / ED Diagnoses   Final diagnoses:  Viral pharyngitis    New Prescriptions New Prescriptions   HYDROCODONE-ACETAMINOPHEN (HYCET) 7.5-325 MG/15 ML SOLUTION    Take 15 mLs by mouth every 8 (eight) hours as needed for moderate pain.   IBUPROFEN (ADVIL,MOTRIN) 600 MG TABLET    Take 1 tablet (600 mg total) by mouth every 6 (six) hours as needed.   I personally performed the services described in this documentation, which was scribed in my presence. The recorded information has been reviewed and is accurate.     Satira Sarkicole Elizabeth Pres[place] Isle HarborNadeau, [place]-C 07/27/16 1255    Canary Brimhristopher J Tegeler, MD 07/28/16 1011

## 2016-07-29 ENCOUNTER — Encounter (HOSPITAL_COMMUNITY): Payer: Self-pay

## 2016-07-29 ENCOUNTER — Emergency Department (HOSPITAL_COMMUNITY)
Admission: EM | Admit: 2016-07-29 | Discharge: 2016-07-29 | Disposition: A | Discharge status: Home / Self Care | Payer: Medicaid Other | Attending: Emergency Medicine | Admitting: Emergency Medicine

## 2016-07-29 DIAGNOSIS — Z79899 Other long term (current) drug therapy: Secondary | ICD-10-CM | POA: Insufficient documentation

## 2016-07-29 DIAGNOSIS — F17210 Nicotine dependence, cigarettes, uncomplicated: Secondary | ICD-10-CM | POA: Insufficient documentation

## 2016-07-29 DIAGNOSIS — J029 Acute pharyngitis, unspecified: Secondary | ICD-10-CM

## 2016-07-29 DIAGNOSIS — J45909 Unspecified asthma, uncomplicated: Secondary | ICD-10-CM | POA: Insufficient documentation

## 2016-07-29 DIAGNOSIS — I10 Essential (primary) hypertension: Secondary | ICD-10-CM | POA: Insufficient documentation

## 2016-07-29 LAB — RAPID STREP SCREEN (MED CTR MEBANE ONLY): Streptococcus, Group A Screen (Direct): NEGATIVE

## 2016-07-29 LAB — CULTURE, GROUP A STREP (THRC)

## 2016-07-29 MED ORDER — CEPHALEXIN 500 MG PO CAPS: 500.0000 mg | ORAL_CAPSULE | Freq: Two times a day (BID) | ORAL | 0 refills | Status: DC

## 2016-07-29 MED ORDER — DEXTROSE 5 % IV SOLN
1.0000 g | Freq: Once | INTRAVENOUS | Status: AC
  Administered 2016-07-29: 1 g via INTRAVENOUS
  Filled 2016-07-29: qty 10

## 2016-07-29 MED ORDER — KETOROLAC TROMETHAMINE 30 MG/ML IJ SOLN
30.0000 mg | Freq: Once | INTRAMUSCULAR | Status: AC
  Administered 2016-07-29: 30 mg via INTRAVENOUS
  Filled 2016-07-29: qty 1

## 2016-07-29 MED ORDER — SODIUM CHLORIDE 0.9 % IV BOLUS (SEPSIS)
1000.0000 mL | Freq: Once | INTRAVENOUS | Status: AC
  Administered 2016-07-29: 1000 mL via INTRAVENOUS

## 2016-07-29 NOTE — Discharge Instructions (Signed)
Medications: Keflex  Treatment: Take Keflex twice daily for 10 days. Make sure to finish all this antibiotic. [facility] You can take ibuprofen ( up to 800 mg every 8 hours) or Tylenol as prescribed over-the-counter for your pain. Make sure to drink plenty of water.  Follow-up: Please return to emergency department if you develop any new or worsening symptoms.

## 2016-07-29 NOTE — ED Triage Notes (Signed)
Pt returning here for woresening sore throat and mucus production. Pt states she cannot afford the Rx she was given at the end of her last visit. Pt declined the Abx injection at her last visit her but would like it today. Pt A+OX4, speaking in complete sentences, ambulatory to triage.

## 2016-07-29 NOTE — ED Notes (Signed)
Pt tolerating applesauce and PO fluids well w/o difficulty.

## 2016-07-29 NOTE — ED Provider Notes (Signed)
[facility]-EMER[facility]Y DEPT Provider Note   CSN: [id number] Arrival date & time: 07/29/16  1038  By signing my name below, I, Soijett Blue, attest that this documentation has been prepared under the direction and in the presence of Buel Ream, PA-C Electronically Signed: Soijett Blue, ED Scribe. 07/29/16. 11:08 AM.  History   Chief Complaint Chief Complaint  Patient presents with  . Sore Throat  . Nasal Congestion    HPI [name] [name] is a 50 y.o. female with a PMHx of HTN, who presents to the Emergency Department complaining of gradually worsening sore throat, right > left onset 4 days ago. Pt notes that she was seen in the ED 2 days ago with a negative rapid step. Pt states that she was offered a penicillin injection, to which she refused. Pt notes that she hasn't taken her prescription pain medications due to financial reasons. Pt reports that she has sick contacts of her grandchildren. She is having associated symptoms of mild cough, nasal congestion, and drooling. She has not tried any medications for the relief of her symptoms. She denies fever, chills, CP, appetite change, abdominal pain, urinary symptoms, and any other symptoms.   Per pt chart review: Pt was seen in the ED on 07/27/2016 for sore throat. Pt was given ibuprofen and prednisone while in the ED. Pt was Rx hycet and ibuprofen for their symptoms.   The history is provided by the patient. No language interpreter was used.    Past Medical History:  Diagnosis Date  . Asthma   . Bipolar 1 disorder (HCC)   . Chronic lumbar pain   . DDD (degenerative disc disease)   . Depression   . GERD (gastroesophageal reflux disease)   . History of gastric ulcer   . Hyperlipidemia   . Hypertension   . Migraine   . Osteoarthritis   . Right ACL tear   . Right knee meniscal tear   . Sciatica   . Wears glasses     Patient Active Problem List   Diagnosis Date Noted  . S/P ACL reconstruction 01/31/2014  . DEPRESSION 11/17/2008   . ACUTE SINUSITIS, UNSPECIFIED 11/17/2008  . ALLERGIC RHINITIS 11/17/2008  . GERD 11/17/2008  . HEADACHE 11/17/2008    Past Surgical History:  Procedure Laterality Date  . ABDOMINAL HYSTERECTOMY    . BUNIONECTOMY Left 2011  . HEEL SPUR SURGERY Right 2012  . KNEE ARTHROSCOPY WITH ANTERIOR CRUCIATE LIGAMENT (ACL) REPAIR WITH HAMSTRING GRAFT Right 01/31/2014   Procedure: RIGHT KNEE ARTHROSCOPY WITH ALLOGRAFT (ACL) ANTERIOR CRUCIATE LIGAMENT RECONSTRUCTON PARTIAL MENISCECTOMY VERSES REPAIR ;  Surgeon: Eugenia Mcalpine, MD;  Location: [facility];  Service: Orthopedics;  Laterality: Right;  . TOTAL ABDOMINAL HYSTERECTOMY W/ BILATERAL SALPINGOOPHORECTOMY  04-20-2006    OB History    No data available       Home Medications    Prior to Admission medications   Medication Sig Start Date End Date Taking? Authorizing Provider  Acetaminophen (PAIN RELIEF PO) Take 2 tablets by mouth every 2 (two) hours as needed (pain).    Historical Provider, MD  albuterol (PROVENTIL HFA;VENTOLIN HFA) 108 (90 BASE) MCG/ACT inhaler Inhale 2 puffs into the lungs every 6 (six) hours as needed for wheezing or shortness of breath.    Historical Provider, MD  amLODipine (NORVASC) 2.5 MG tablet Take 2.5 mg by mouth every morning.     Historical Provider, MD  cephALEXin (KEFLEX) 500 MG capsule Take 1 capsule (500 mg total) by mouth 2 (two) times  daily. 07/29/16   Emi HolesAlexandra M Mukund Weinreb, PA-C  cholecalciferol (VITAMIN D) 1000 units tablet Take 1,000 Units by mouth daily.    Historical Provider, MD  famotidine (PEPCID) 40 MG tablet Take 1 tablet (40 mg total) by mouth at bedtime. Patient taking differently: Take 40 mg by mouth at bedtime as needed for heartburn or indigestion.  08/03/15   Ace GinsSerena Y Sam, PA-C  gabapentin (NEURONTIN) 300 MG capsule Take 300 mg by mouth 3 (three) times daily as needed (for pain).     Historical Provider, MD  HYDROcodone-acetaminophen (HYCET) 7.5-325 mg/15 ml solution Take 15 mLs by  mouth every 8 (eight) hours as needed for moderate pain. 07/27/16   Barrett HenleNicole Elizabeth Nadeau, PA-C  ibuprofen (ADVIL,MOTRIN) 600 MG tablet Take 1 tablet (600 mg total) by mouth every 6 (six) hours as needed. 07/27/16   Barrett HenleNicole Elizabeth Nadeau, PA-C  lisinopril-hydrochlorothiazide (PRINZIDE,ZESTORETIC) 20-12.5 MG per tablet Take 1 tablet by mouth every morning.     Historical Provider, MD  meloxicam (MOBIC) 15 MG tablet Take 15 mg by mouth daily.    Historical Provider, MD  Multiple Vitamins-Minerals (MULTIVITAMIN ADULT PO) Take 1 tablet by mouth daily.    Historical Provider, MD  omeprazole (PRILOSEC) 40 MG capsule Take 1 capsule (40 mg total) by mouth daily. Patient taking differently: Take 40 mg by mouth daily as needed (heartburn/ acid reflux).  08/03/15   Ace GinsSerena Y Sam, PA-C  pantoprazole (PROTONIX) 20 MG tablet Take 2 tablets (40 mg total) by mouth daily. Patient taking differently: Take 40 mg by mouth daily as needed for heartburn or indigestion.  06/07/15   Alvira MondayErin Schlossman, MD  traMADol-acetaminophen (ULTRACET) 37.5-325 MG tablet Take 1-2 tablets by mouth every 6 (six) hours as needed. Patient not taking: Reported on 02/17/2016 09/25/15   Arby BarretteMarcy Pfeiffer, MD    Family History Family History  Problem Relation Age of Onset  . Hypertension Father     Social History Social History  Substance Use Topics  . Smoking status: Current Every Day Smoker    Packs/day: 0.50    Years: 32.00    Types: Cigarettes  . Smokeless tobacco: Never Used  . Alcohol use 8.4 oz/week    14 Cans of beer per week     Comment: 4 40 ounce beers and liquor     Allergies   Diphenhydramine hcl   Review of Systems Review of Systems  Constitutional: Negative for chills and fever.  HENT: Positive for congestion, drooling and sore throat.   Respiratory: Positive for cough.   Cardiovascular: Negative for chest pain.  Gastrointestinal: Negative for abdominal pain, nausea and vomiting.  Genitourinary: Negative  for difficulty urinating.    Physical Exam Updated Vital Signs BP 154/99 (BP Location: Right Arm)   Pulse 73   Temp 98.1 F (36.7 C) (Oral)   Resp 16   SpO2 100%   Physical Exam  Constitutional: She is oriented to person, place, and time. She appears well-developed and well-nourished. No distress.  HENT:  Head: Normocephalic and atraumatic.  Mouth/Throat: Uvula is midline and mucous membranes are normal. No trismus in the jaw. No dental abscesses. Posterior oropharyngeal edema and posterior oropharyngeal erythema present. No oropharyngeal exudate or tonsillar abscesses. Tonsils are 2+ on the right. Tonsils are 2+ on the left.  Eyes: Conjunctivae and EOM are normal. Pupils are equal, round, and reactive to light. Right eye exhibits no discharge. Left eye exhibits no discharge. No scleral icterus.  Neck: Normal range of motion. Neck supple. No thyromegaly present.  Cardiovascular: Normal rate, regular rhythm and normal heart sounds.  Exam reveals no gallop and no friction rub.   No murmur heard. Pulmonary/Chest: Effort normal and breath sounds normal. No stridor. No respiratory distress. She has no wheezes. She has no rales.  Abdominal: Soft. Bowel sounds are normal. She exhibits no distension. There is no tenderness. There is no rebound and no guarding.  Musculoskeletal: Normal range of motion. She exhibits no edema.  Lymphadenopathy:    She has cervical adenopathy.  Right sided anterior cervical LAD, tender and mobile.   Neurological: She is alert and oriented to person, place, and time. Coordination normal.  Skin: Skin is warm and dry. No rash noted. She is not diaphoretic. No pallor.  Psychiatric: She has a normal mood and affect. Her behavior is normal.  Nursing note and vitals reviewed.   ED Treatments / Results  DIAGNOSTIC STUDIES: Oxygen Saturation is 100% on RA, nl by my interpretation.    COORDINATION OF CARE: 11:00 AM Discussed treatment plan with pt at bedside which  includes rapid strep screen and culture, consult with attending, IV fluids, rocephin, and pt agreed to plan.   Labs (all labs ordered are listed, but only abnormal results are displayed) Labs Reviewed  RAPID STREP SCREEN (NOT AT [facility])  CULTURE, GROUP A STREP [facility])    Procedures Procedures (including critical care time)  Medications Ordered in ED Medications  sodium chloride 0.9 % bolus 1,000 mL (0 mLs Intravenous Stopped 07/29/16 1301)  cefTRIAXone (ROCEPHIN) 1 g in dextrose 5 % 50 mL IVPB (0 g Intravenous Stopped 07/29/16 1153)  ketorolac (TORADOL) 30 MG/ML injection 30 mg (30 mg Intravenous Given 07/29/16 1123)     Initial Impression / Assessment and Plan / ED Course  I have reviewed the triage vital signs and the nursing notes.  Pertinent labs that were available during my care of the patient were reviewed by me and considered in my medical decision making (see chart for details).  Clinical Course     Patient with negative rapid strep, however no culture resulted from 2 days ago. No tonsillar abscess or trismus. Patient evaluated by Dr. Estell HarpinZammit who recommended IV fluids, Rocephin, Toradol. Patient feeling much better after these therapies and able to swallow her saliva. Patient discharged home with Keflex. Supportive treatment discussed at home. Strict return precautions discussed. Patient understands and agrees with plan. Patient vitals stable throughout ED course and discharged in satisfactory condition.  Final Clinical Impressions(s) / ED Diagnoses   Final diagnoses:  Acute pharyngitis, unspecified etiology    New Prescriptions Discharge Medication List as of 07/29/2016  1:26 PM     I personally performed the services described in this documentation, which was scribed in my presence. The recorded information has been reviewed and is accurate.     Emi Holeslexandra M Darragh Nay, PA-C 07/29/16 1440    Bethann BerkshireJoseph Zammit, MD 07/30/16 [phone]

## 2016-07-30 ENCOUNTER — Inpatient Hospital Stay (HOSPITAL_COMMUNITY)
Admission: EM | Admit: 2016-07-30 | Discharge: 2016-08-01 | DRG: 153 | Disposition: A | Discharge status: Home / Self Care | Payer: Medicaid Other | Attending: Internal Medicine | Admitting: Internal Medicine

## 2016-07-30 ENCOUNTER — Emergency Department (HOSPITAL_COMMUNITY): Payer: Self-pay

## 2016-07-30 ENCOUNTER — Encounter (HOSPITAL_COMMUNITY): Payer: Self-pay

## 2016-07-30 DIAGNOSIS — E860 Dehydration: Secondary | ICD-10-CM | POA: Diagnosis present

## 2016-07-30 DIAGNOSIS — F17210 Nicotine dependence, cigarettes, uncomplicated: Secondary | ICD-10-CM | POA: Diagnosis present

## 2016-07-30 DIAGNOSIS — K219 Gastro-esophageal reflux disease without esophagitis: Secondary | ICD-10-CM | POA: Diagnosis present

## 2016-07-30 DIAGNOSIS — J029 Acute pharyngitis, unspecified: Secondary | ICD-10-CM

## 2016-07-30 DIAGNOSIS — E785 Hyperlipidemia, unspecified: Secondary | ICD-10-CM | POA: Diagnosis present

## 2016-07-30 DIAGNOSIS — D649 Anemia, unspecified: Secondary | ICD-10-CM | POA: Diagnosis present

## 2016-07-30 DIAGNOSIS — D72829 Elevated white blood cell count, unspecified: Secondary | ICD-10-CM

## 2016-07-30 DIAGNOSIS — J45909 Unspecified asthma, uncomplicated: Secondary | ICD-10-CM | POA: Diagnosis present

## 2016-07-30 DIAGNOSIS — I10 Essential (primary) hypertension: Secondary | ICD-10-CM | POA: Diagnosis present

## 2016-07-30 DIAGNOSIS — J989 Respiratory disorder, unspecified: Secondary | ICD-10-CM

## 2016-07-30 DIAGNOSIS — Z79899 Other long term (current) drug therapy: Secondary | ICD-10-CM

## 2016-07-30 DIAGNOSIS — E876 Hypokalemia: Secondary | ICD-10-CM | POA: Diagnosis present

## 2016-07-30 DIAGNOSIS — F319 Bipolar disorder, unspecified: Secondary | ICD-10-CM | POA: Diagnosis present

## 2016-07-30 DIAGNOSIS — J039 Acute tonsillitis, unspecified: Secondary | ICD-10-CM

## 2016-07-30 DIAGNOSIS — Z888 Allergy status to other drugs, medicaments and biological substances status: Secondary | ICD-10-CM

## 2016-07-30 DIAGNOSIS — F1010 Alcohol abuse, uncomplicated: Secondary | ICD-10-CM | POA: Diagnosis present

## 2016-07-30 DIAGNOSIS — J390 Retropharyngeal and parapharyngeal abscess: Principal | ICD-10-CM | POA: Diagnosis present

## 2016-07-30 LAB — CBC WITH DIFFERENTIAL/PLATELET
Basophils Absolute: 0 10*3/uL (ref 0.0–0.1)
Basophils Relative: 0 %
Eosinophils Absolute: 0.1 10*3/uL (ref 0.0–0.7)
Eosinophils Relative: 1 %
HCT: 31.2 % — ABNORMAL LOW (ref 36.0–46.0)
Hemoglobin: 10.8 g/dL — ABNORMAL LOW (ref 12.0–15.0)
Lymphocytes Relative: 25 %
Lymphs Abs: 2.7 10*3/uL (ref 0.7–4.0)
MCH: 32.1 pg (ref 26.0–34.0)
MCHC: 34.6 g/dL (ref 30.0–36.0)
MCV: 92.9 fL (ref 78.0–100.0)
Monocytes Absolute: 1 10*3/uL (ref 0.1–1.0)
Monocytes Relative: 10 %
Neutro Abs: 7.1 10*3/uL (ref 1.7–7.7)
Neutrophils Relative %: 64 %
Platelets: 345 10*3/uL (ref 150–400)
RBC: 3.36 MIL/uL — ABNORMAL LOW (ref 3.87–5.11)
RDW: 12.8 % (ref 11.5–15.5)
WBC: 11 10*3/uL — ABNORMAL HIGH (ref 4.0–10.5)

## 2016-07-30 LAB — BASIC METABOLIC PANEL
Anion gap: 8 (ref 5–15)
BUN: 11 mg/dL (ref 6–20)
CO2: 26 mmol/L (ref 22–32)
Calcium: 9.1 mg/dL (ref 8.9–10.3)
Chloride: 106 mmol/L (ref 101–111)
Creatinine, Ser: 0.73 mg/dL (ref 0.44–1.00)
GFR calc Af Amer: >60 mL/min (ref >60)
GFR calc non Af Amer: >60 mL/min (ref >60)
Glucose, Bld: 96 mg/dL (ref 65–99)
Potassium: 3.3 mmol/L — ABNORMAL LOW (ref 3.5–5.1)
Sodium: 140 mmol/L (ref 135–145)

## 2016-07-30 MED ORDER — IOPAMIDOL (ISOVUE-300) INJECTION 61%
INTRAVENOUS | Status: AC
  Filled 2016-07-30: qty 75

## 2016-07-30 MED ORDER — MORPHINE SULFATE (PF) 4 MG/ML IV SOLN
4.0000 mg | Freq: Once | INTRAVENOUS | Status: AC
  Administered 2016-07-30: 4 mg via INTRAVENOUS
  Filled 2016-07-30: qty 1

## 2016-07-30 MED ORDER — DEXAMETHASONE SODIUM PHOSPHATE 10 MG/ML IJ SOLN
10.0000 mg | Freq: Once | INTRAMUSCULAR | Status: AC
  Administered 2016-07-30: 10 mg via INTRAVENOUS
  Filled 2016-07-30: qty 1

## 2016-07-30 MED ORDER — IOPAMIDOL (ISOVUE-300) INJECTION 61%
75.0000 mL | Freq: Once | INTRAVENOUS | Status: AC | PRN
  Administered 2016-07-30: 75 mL via INTRAVENOUS

## 2016-07-30 MED ORDER — KETOROLAC TROMETHAMINE 15 MG/ML IJ SOLN
15.0000 mg | Freq: Once | INTRAMUSCULAR | Status: AC
  Administered 2016-07-30: 15 mg via INTRAVENOUS
  Filled 2016-07-30: qty 1

## 2016-07-30 MED ORDER — CLINDAMYCIN PHOSPHATE 900 MG/50ML IV SOLN
900.0000 mg | Freq: Once | INTRAVENOUS | Status: AC
  Administered 2016-07-30: 900 mg via INTRAVENOUS
  Filled 2016-07-30: qty 50

## 2016-07-30 MED ORDER — SODIUM CHLORIDE 0.9 % IV BOLUS (SEPSIS)
1000.0000 mL | Freq: Once | INTRAVENOUS | Status: AC
  Administered 2016-07-30: 1000 mL via INTRAVENOUS

## 2016-07-30 MED ORDER — VANCOMYCIN HCL IN DEXTROSE 1-5 GM/200ML-% IV SOLN
1000.0000 mg | Freq: Once | INTRAVENOUS | Status: AC
  Administered 2016-07-30: 1000 mg via INTRAVENOUS
  Filled 2016-07-30: qty 200

## 2016-07-30 MED ORDER — SODIUM CHLORIDE 0.9 % IV SOLN
Freq: Once | INTRAVENOUS | Status: AC
  Administered 2016-07-30: 18:00:00 via INTRAVENOUS

## 2016-07-30 MED ORDER — VANCOMYCIN HCL IN DEXTROSE 750-5 MG/150ML-% IV SOLN
750.0000 mg | Freq: Two times a day (BID) | INTRAVENOUS | Status: DC
  Administered 2016-07-31 – 2016-08-01 (×3): 750 mg via INTRAVENOUS
  Filled 2016-07-30 (×4): qty 150

## 2016-07-30 MED ORDER — CEFTRIAXONE SODIUM 1 G IJ SOLR
1.0000 g | Freq: Once | INTRAMUSCULAR | Status: AC
  Administered 2016-07-30: 1 g via INTRAVENOUS
  Filled 2016-07-30: qty 10

## 2016-07-30 NOTE — Consult Note (Signed)
Reason for Consult:pharyngeal infection Referring Physician: ER  [name] [name] is an 50 y.o. female.  HPI: With a 5 day history of sore throat. She has had mostly the inability to swallow liquids or solids. She was started on antibiotics yesterday and has only taken 2 of them so far. She really doesn't feel much better. She's had no nasal obstruction but some congestion. She did have an upper respiratory infection at the time of onset. She does not have repetitive or persistent tonsillitis or pharyngitis issues. She is not aware of anyone sick around her. She has not started any new medications. She has a sore throat that is diffuse not particular to one side. She presented to the emergency room with evaluation with a CT scan.  Past Medical History:  Diagnosis Date  . Asthma   . Bipolar 1 disorder ([place])   . Chronic lumbar pain   . DDD (degenerative disc disease)   . Depression   . GERD (gastroesophageal reflux disease)   . History of gastric ulcer   . Hyperlipidemia   . Hypertension   . Migraine   . Osteoarthritis   . Right ACL tear   . Right knee meniscal tear   . Sciatica   . Wears glasses     Past Surgical History:  Procedure Laterality Date  . ABDOMINAL HYSTERECTOMY    . BUNIONECTOMY Left 2011  . HEEL SPUR SURGERY Right 2012  . KNEE ARTHROSCOPY WITH ANTERIOR CRUCIATE LIGAMENT (ACL) REPAIR WITH HAMSTRING GRAFT Right 01/31/2014   Procedure: RIGHT KNEE ARTHROSCOPY WITH ALLOGRAFT (ACL) ANTERIOR CRUCIATE LIGAMENT RECONSTRUCTON PARTIAL MENISCECTOMY VERSES REPAIR ;  Surgeon: Sydnee Cabal, MD;  Location: [facility];  Service: Orthopedics;  Laterality: Right;  . TOTAL ABDOMINAL HYSTERECTOMY W/ BILATERAL SALPINGOOPHORECTOMY  04-20-2006    Family History  Problem Relation Age of Onset  . Hypertension Father     Social History:  reports that she has been smoking Cigarettes.  She has a 16.00 pack-year smoking history. She has never used smokeless tobacco. She  reports that she drinks about 8.4 oz of alcohol per week . She reports that she uses drugs, including Marijuana.  Allergies:  Allergies  Allergen Reactions  . Diphenhydramine Hcl Itching    Medications: I have reviewed the patient's current medications.  Results for orders placed or performed during the hospital encounter of 07/30/16 (from the past 48 hour(s))  CBC with Differential     Status: Abnormal   Collection Time: 07/30/16  3:57 PM  Result Value Ref Range   WBC 11.0 (H) 4.0 - 10.5 K/uL   RBC 3.36 (L) 3.87 - 5.11 MIL/uL   Hemoglobin 10.8 (L) 12.0 - 15.0 g/dL   HCT 31.2 (L) 36.0 - 46.0 %   MCV 92.9 78.0 - 100.0 fL   MCH 32.1 26.0 - 34.0 pg   MCHC 34.6 30.0 - 36.0 g/dL   RDW 12.8 11.5 - 15.5 %   Platelets 345 150 - 400 K/uL   Neutrophils Relative % 64 %   Neutro Abs 7.1 1.7 - 7.7 K/uL   Lymphocytes Relative 25 %   Lymphs Abs 2.7 0.7 - 4.0 K/uL   Monocytes Relative 10 %   Monocytes Absolute 1.0 0.1 - 1.0 K/uL   Eosinophils Relative 1 %   Eosinophils Absolute 0.1 0.0 - 0.7 K/uL   Basophils Relative 0 %   Basophils Absolute 0.0 0.0 - 0.1 K/uL  Basic metabolic panel     Status: Abnormal   Collection Time:  07/30/16  3:57 PM  Result Value Ref Range   Sodium 140 135 - 145 mmol/L   Potassium 3.3 (L) 3.5 - 5.1 mmol/L   Chloride 106 101 - 111 mmol/L   CO2 26 22 - 32 mmol/L   Glucose, Bld 96 65 - 99 mg/dL   BUN 11 6 - 20 mg/dL   Creatinine, Ser 0.73 0.44 - 1.00 mg/dL   Calcium 9.1 8.9 - 10.3 mg/dL   GFR calc non Af Amer >60 >60 mL/min   GFR calc Af Amer >60 >60 mL/min    Comment: (NOTE) The eGFR has been calculated using the CKD EPI equation. This calculation has not been validated in all clinical situations. eGFR's persistently <60 mL/min signify possible Chronic Kidney Disease.    Anion gap 8 5 - 15    Ct Soft Tissue Neck W Contrast  Result Date: 07/30/2016 CLINICAL DATA:  50 year old female with increasing sore throat and productive cough for 5 days. Started  antibiotics without relief. Initial encounter. EXAM: CT NECK WITH CONTRAST TECHNIQUE: Multidetector CT imaging of the neck was performed using the standard protocol following the bolus administration of intravenous contrast. CONTRAST:  31m ISOVUE-300 IOPAMIDOL (ISOVUE-300) INJECTION 61% COMPARISON:  No comparison neck CT. Comparison cervical spine MR 01/30/2008 and head CT 10/29/2007. FINDINGS: Pharynx and larynx: Diffuse enlargement lymphoid tissue of Waldeyer's ring with diffuse inflammation most notable involving right palatine tonsil with extension into the supraglottic region with narrowing of the piriform sinus and thickening of the aryepiglottic folds. With this enlarged tonsillar tissue, there is narrowing of the air column. Breakthrough into right parapharyngeal space and retropharyngeal space without deep drainable abscess. Tumor as cause for above described findings felt to be secondary less likely consideration given the patient's presenting symptoms. Salivary glands: No primary parotid or salivary gland abnormality. Thyroid: No thyroid lesion. Lymph nodes: Diffuse adenopathy greater on the right and most notable involving level 2 region and right lateral retropharyngeal node. Vascular: No septic thrombophlebitis. Limited intracranial: Negative. Visualized orbits: Negative. Mastoids and visualized paranasal sinuses: Clear. Skeleton: Cervical spondylotic changes C3-4 thru C6-7. Upper chest: No worrisome lung lesion. Other: Negative. IMPRESSION: Diffuse enlargement lymphoid tissue of Waldeyer's ring with diffuse inflammation most notable involving right palatine tonsil with extension into the supraglottic region with narrowing of the piriform sinus and thickening of the aryepiglottic folds. With this enlarged tonsillar tissue, there is narrowing of the air column. Breakthrough into right parapharyngeal space and retropharyngeal space without deep drainable abscess. Tumor as cause for above described  findings felt to be secondary less likely consideration given the patient's presenting symptoms. Diffuse adenopathy greater on the right and most notable involving level 2 region and right lateral retropharyngeal node. These results were called by telephone at the time of interpretation on 07/30/2016 at 5:53 pm to Dr. CDuffy Bruce who verbally acknowledged these results. Electronically Signed   By: SGenia DelM.D.   On: 07/30/2016 17:59    Review of Systems  Constitutional: Negative.   HENT: Positive for sore throat.   Eyes: Negative.   Respiratory: Negative.   Cardiovascular: Negative.   Skin: Negative.    Blood pressure 148/91, pulse 67, temperature 99 F (37.2 C), temperature source Oral, resp. rate 16, SpO2 99 %. Physical Exam   Alert and awake Face: No evidence of lesions or swelling. Facial nerve is intact. Eyes: Pupils equally round and reactive to light extraocular motor muscles are intact Ears: No evidence of any lesions Nose: No swelling or drainage. Septum seems  to be midline. The external nose has no deformity or inflammation. Oral cavity/oropharynx: No evidence of any erythema or lesions. Tongue looks normal. Both tonsils are about +2 without any bulging palate and uvula is midline Hypopharynx/larynx-fiberoptic exam reveals lymph tissue in the nasopharynx that is swollen but still open nasopharynx. The tonsils are enlarged bilaterally equal. The base of tongue has lingual tonsils bilaterally that are very large. The epiglottis is obscured by the swelling but once the scope was down to that level the vocal cords could be seen and they seem to move normally. There is only a slight thickening but no edematous swelling.  The right piriform is slightly swollen. The airway was clear once past the lingual tonsils. Neck: No swelling, masses, or lesions. Soft and good range of motion Neurologic: No evidence of focal lesions or abnormalities. resp: normal effort Cv: Regular Ext: no  tenderness or swelling Psych: no obvious issues  Assessment/Plan: Pharyngitis-I reviewed her CT scan which shows diffuse swelling of most all the lymph tissue in her pharynx and the exam is consistent with this. I suspect she has a viral infection causing this but definitely antibiotic coverage is needed. This may be a Mono like infection causing lymph tissue swelling. I would recommend admission by the hospitalists with intravenous antibiotics and steroids. There is no indication for a surgical intervention at this time.  Melissa Montane 07/30/2016, 8:28 PM

## 2016-07-30 NOTE — ED Provider Notes (Signed)
[facility]-EMERGENCY DEPT Provider Note   CSN: [id number] Arrival date & time: 07/30/16  1252     History   Chief Complaint Chief Complaint  Patient presents with  . Sore Throat    HPI LORETTA DOUTT is a 50 y.o. female.  HPI 50 yo F with PMHx as below here with sore throat. Pt states her sx started 1 week ago with mild bilateral sore throat. Since then, she has had progressively worsening right sided sore throat with onset of drooling, hoarseness, and increasing pain over the past several days. She has been seen twice in the ED for these sx and received IV rocephin, toradol yesterday with sx improvement. However, she states that since returning home she has had persistent pain, nausea, and has been unable to eat or drink. Denies fevers. Endorses fullness sensation in right throat that is worse with swallowing. Denies any neck pain or stiffness.  Past Medical History:  Diagnosis Date  . Asthma   . Bipolar 1 disorder ([facility])   . Chronic lumbar pain   . DDD (degenerative disc disease)   . Depression   . GERD (gastroesophageal reflux disease)   . History of gastric ulcer   . Hyperlipidemia   . Hypertension   . Migraine   . Osteoarthritis   . Right ACL tear   . Right knee meniscal tear   . Sciatica   . Wears glasses     Patient Active Problem List   Diagnosis Date Noted  . S/P ACL reconstruction 01/31/2014  . DEPRESSION 11/17/2008  . ACUTE SINUSITIS, UNSPECIFIED 11/17/2008  . ALLERGIC RHINITIS 11/17/2008  . GERD 11/17/2008  . HEADACHE 11/17/2008    Past Surgical History:  Procedure Laterality Date  . ABDOMINAL HYSTERECTOMY    . BUNIONECTOMY Left 2011  . HEEL SPUR SURGERY Right 2012  . KNEE ARTHROSCOPY WITH ANTERIOR CRUCIATE LIGAMENT (ACL) REPAIR WITH HAMSTRING GRAFT Right 01/31/2014   Procedure: RIGHT KNEE ARTHROSCOPY WITH ALLOGRAFT (ACL) ANTERIOR CRUCIATE LIGAMENT RECONSTRUCTON PARTIAL MENISCECTOMY VERSES REPAIR ;  Surgeon: Eugenia Mcalpine, MD;  Location: [facility];  Service: Orthopedics;  Laterality: Right;  . TOTAL ABDOMINAL HYSTERECTOMY W/ BILATERAL SALPINGOOPHORECTOMY  04-20-2006    OB History    No data available       Home Medications    Prior to Admission medications   Medication Sig Start Date End Date Taking? Authorizing Provider  Acetaminophen (PAIN RELIEF PO) Take 2 tablets by mouth every 2 (two) hours as needed (pain).    Historical Provider, MD  albuterol (PROVENTIL HFA;VENTOLIN HFA) 108 (90 BASE) MCG/ACT inhaler Inhale 2 puffs into the lungs every 6 (six) hours as needed for wheezing or shortness of breath.    Historical Provider, MD  amLODipine (NORVASC) 2.5 MG tablet Take 2.5 mg by mouth every morning.     Historical Provider, MD  cephALEXin (KEFLEX) 500 MG capsule Take 1 capsule (500 mg total) by mouth 2 (two) times daily. 07/29/16   Emi Holes, PA-C  cholecalciferol (VITAMIN D) 1000 units tablet Take 1,000 Units by mouth daily.    Historical Provider, MD  famotidine (PEPCID) 40 MG tablet Take 1 tablet (40 mg total) by mouth at bedtime. Patient taking differently: Take 40 mg by mouth at bedtime as needed for heartburn or indigestion.  08/03/15   Ace Gins Sam, PA-C  gabapentin (NEURONTIN) 300 MG capsule Take 300 mg by mouth 3 (three) times daily as needed (for pain).     Historical Provider, MD  HYDROcodone-acetaminophen (HYCET)  7.5-325 mg/15 ml solution Take 15 mLs by mouth every 8 (eight) hours as needed for moderate pain. 07/27/16   Barrett HenleNicole Elizabeth Nadeau, PA-C  ibuprofen (ADVIL,MOTRIN) 600 MG tablet Take 1 tablet (600 mg total) by mouth every 6 (six) hours as needed. 07/27/16   Barrett HenleNicole Elizabeth Nadeau, PA-C  lisinopril-hydrochlorothiazide (PRINZIDE,ZESTORETIC) 20-12.5 MG per tablet Take 1 tablet by mouth every morning.     Historical Provider, MD  meloxicam (MOBIC) 15 MG tablet Take 15 mg by mouth daily.    Historical Provider, MD  Multiple Vitamins-Minerals (MULTIVITAMIN ADULT PO) Take 1 tablet by mouth daily.     Historical Provider, MD  omeprazole (PRILOSEC) 40 MG capsule Take 1 capsule (40 mg total) by mouth daily. Patient taking differently: Take 40 mg by mouth daily as needed (heartburn/ acid reflux).  08/03/15   Ace GinsSerena Y Sam, PA-C  pantoprazole (PROTONIX) 20 MG tablet Take 2 tablets (40 mg total) by mouth daily. Patient taking differently: Take 40 mg by mouth daily as needed for heartburn or indigestion.  06/07/15   Alvira MondayErin Schlossman, MD  traMADol-acetaminophen (ULTRACET) 37.5-325 MG tablet Take 1-2 tablets by mouth every 6 (six) hours as needed. Patient not taking: Reported on 02/17/2016 09/25/15   Arby BarretteMarcy Pfeiffer, MD    Family History Family History  Problem Relation Age of Onset  . Hypertension Father     Social History Social History  Substance Use Topics  . Smoking status: Current Every Day Smoker    Packs/day: 0.50    Years: 32.00    Types: Cigarettes  . Smokeless tobacco: Never Used  . Alcohol use 8.4 oz/week    14 Cans of beer per week     Comment: 4 40 ounce beers and liquor     Allergies   Diphenhydramine hcl   Review of Systems Review of Systems  Constitutional: Positive for fatigue. Negative for chills and fever.  HENT: Positive for drooling, sore throat, trouble swallowing and voice change. Negative for congestion and rhinorrhea.   Eyes: Negative for visual disturbance.  Respiratory: Negative for cough, shortness of breath and wheezing.   Cardiovascular: Negative for chest pain and leg swelling.  Gastrointestinal: Negative for abdominal pain, diarrhea, nausea and vomiting.  Genitourinary: Negative for dysuria, flank pain, vaginal bleeding and vaginal discharge.  Musculoskeletal: Negative for neck pain.  Skin: Negative for rash.  Allergic/Immunologic: Negative for immunocompromised state.  Neurological: Negative for syncope and headaches.  Hematological: Does not bruise/bleed easily.  All other systems reviewed and are negative.    Physical Exam Updated Vital  Signs BP 148/91   Pulse 77   Temp 98.8 F (37.1 C)   Resp 18   SpO2 98%   Physical Exam  Constitutional: She is oriented to person, place, and time. She appears well-developed and well-nourished. No distress.  HENT:  Head: Normocephalic and atraumatic.  Mild posterior pharyngeal erythema, mild right peritonsillar fullness but uvula midline. Hoarse voice. No trismus.  Eyes: Conjunctivae are normal.  Neck: Normal range of motion. Neck supple. No tracheal deviation present.  Cardiovascular: Normal rate, regular rhythm and normal heart sounds.  Exam reveals no friction rub.   No murmur heard. Pulmonary/Chest: Effort normal and breath sounds normal. No stridor. No respiratory distress. She has no wheezes. She has no rales.  Abdominal: She exhibits no distension.  Musculoskeletal: She exhibits no edema.  Neurological: She is alert and oriented to person, place, and time. She exhibits normal muscle tone.  Skin: Skin is warm. Capillary refill takes less than 2  seconds.  Psychiatric: She has a normal mood and affect.  Nursing note and vitals reviewed.    ED Treatments / Results  Labs (all labs ordered are listed, but only abnormal results are displayed) Labs Reviewed  CBC WITH DIFFERENTIAL/PLATELET - Abnormal; Notable for the following:       Result Value   WBC 11.0 (*)    RBC 3.36 (*)    Hemoglobin 10.8 (*)    HCT 31.2 (*)    All other components within normal limits  BASIC METABOLIC PANEL - Abnormal; Notable for the following:    Potassium 3.3 (*)    All other components within normal limits  CULTURE, BLOOD (ROUTINE X 2)  CULTURE, BLOOD (ROUTINE X 2)    EKG  EKG Interpretation None       Radiology Ct Soft Tissue Neck W Contrast  Result Date: 07/30/2016 CLINICAL DATA:  50 year old female with increasing sore throat and productive cough for 5 days. Started antibiotics without relief. Initial encounter. EXAM: CT NECK WITH CONTRAST TECHNIQUE: Multidetector CT imaging of  the neck was performed using the standard protocol following the bolus administration of intravenous contrast. CONTRAST:  75mL ISOVUE-300 IOPAMIDOL (ISOVUE-300) INJECTION 61% COMPARISON:  No comparison neck CT. Comparison cervical spine MR 01/30/2008 and head CT 10/29/2007. FINDINGS: Pharynx and larynx: Diffuse enlargement lymphoid tissue of Waldeyer's ring with diffuse inflammation most notable involving right palatine tonsil with extension into the supraglottic region with narrowing of the piriform sinus and thickening of the aryepiglottic folds. With this enlarged tonsillar tissue, there is narrowing of the air column. Breakthrough into right parapharyngeal space and retropharyngeal space without deep drainable abscess. Tumor as cause for above described findings felt to be secondary less likely consideration given the patient's presenting symptoms. Salivary glands: No primary parotid or salivary gland abnormality. Thyroid: No thyroid lesion. Lymph nodes: Diffuse adenopathy greater on the right and most notable involving level 2 region and right lateral retropharyngeal node. Vascular: No septic thrombophlebitis. Limited intracranial: Negative. Visualized orbits: Negative. Mastoids and visualized paranasal sinuses: Clear. Skeleton: Cervical spondylotic changes C3-4 thru C6-7. Upper chest: No worrisome lung lesion. Other: Negative. IMPRESSION: Diffuse enlargement lymphoid tissue of Waldeyer's ring with diffuse inflammation most notable involving right palatine tonsil with extension into the supraglottic region with narrowing of the piriform sinus and thickening of the aryepiglottic folds. With this enlarged tonsillar tissue, there is narrowing of the air column. Breakthrough into right parapharyngeal space and retropharyngeal space without deep drainable abscess. Tumor as cause for above described findings felt to be secondary less likely consideration given the patient's presenting symptoms. Diffuse adenopathy  greater on the right and most notable involving level 2 region and right lateral retropharyngeal node. These results were called by telephone at the time of interpretation on 07/30/2016 at 5:53 pm to Dr. Shaune Pollack, who verbally acknowledged these results. Electronically Signed   By: Lacy Duverney M.D.   On: 07/30/2016 17:59    Procedures .Critical Care Performed by: Shaune Pollack Authorized by: Shaune Pollack   Critical care provider statement:    Critical care time (minutes):  35   Critical care time was exclusive of:  Separately billable procedures and treating other patients   Critical care was necessary to treat or prevent imminent or life-threatening deterioration of the following conditions:  Respiratory failure and sepsis   Critical care was time spent personally by me on the following activities:  Development of treatment plan with patient or surrogate, discussions with consultants, evaluation of patient's response to  treatment, examination of patient, obtaining history from patient or surrogate, ordering and review of laboratory studies, ordering and performing treatments and interventions, ordering and review of radiographic studies, pulse oximetry and re-evaluation of patient's condition   I assumed direction of critical care for this patient from another provider in my specialty: no      (including critical care time)  Medications Ordered in ED Medications  cefTRIAXone (ROCEPHIN) 1 g in dextrose 5 % 50 mL IVPB (1 g Intravenous New Bag/Given 07/30/16 1823)  vancomycin (VANCOCIN) IVPB 1000 mg/200 mL premix (not administered)  morphine 4 MG/ML injection 4 mg (not administered)  sodium chloride 0.9 % bolus 1,000 mL (1,000 mLs Intravenous New Bag/Given 07/30/16 1718)  clindamycin (CLEOCIN) IVPB 900 mg (0 mg Intravenous Stopped 07/30/16 1752)  dexamethasone (DECADRON) injection 10 mg (10 mg Intravenous Given 07/30/16 1719)  ketorolac (TORADOL) 15 MG/ML injection 15 mg (15 mg  Intravenous Given 07/30/16 1719)  iopamidol (ISOVUE-300) 61 % injection 75 mL (75 mLs Intravenous Contrast Given 07/30/16 1737)  0.9 %  sodium chloride infusion ( Intravenous New Bag/Given 07/30/16 1826)     Initial Impression / Assessment and Plan / ED Course  I have reviewed the triage vital signs and the nursing notes.  Pertinent labs & imaging results that were available during my care of the patient were reviewed by me and considered in my medical decision making (see chart for details).  Clinical Course     50 year old female with no significant past medical history here with persistent sore throat. Patient has been seen twice for these symptoms and was given Rocephin yesterday. On my assessment, patient does have hoarseness and I'm concerned for possible developing PTA versus RPA. She does not systemically appear ill with no fevers. Will obtain CT scan and labs.  Labs show moderate leukocytosis which is consistent with acute pharyngitis. CT scan is concerning for developing retropharyngeal infection with airway edema and narrowing. She has no stridor or signs of impending airway collapse at this time but will need to be monitored very closely. Will give Decadron, broaden to vancomycin and Rocephin, and plan for admission. ENT has been paged.  Discussed with Dr. Jearld FentonByers. [facility] and [facility].  Final Clinical Impressions(s) / ED Diagnoses   Final diagnoses:  Pharyngitis, unspecified etiology  Tonsillitis  Airway problem  Leukocytosis, unspecified type      Shaune Pollackameron Haydee Jabbour, MD 07/30/16 [phone]

## 2016-07-30 NOTE — ED Notes (Signed)
Patient denies pain and is resting comfortably.  

## 2016-07-30 NOTE — ED Notes (Signed)
Patient transported to CT 

## 2016-07-30 NOTE — Progress Notes (Signed)
Pharmacy Antibiotic Note  [name] RippleStephanie L [name] is a 50 y.o. female admitted on 07/30/2016 with retropharyngeal abscess.  Pharmacy has been consulted for vancomycin dosing.  Plan: Vancomycin 1gm IV x 1 then 750mg  IV q12h Follow renal function, cultures, clinical course     Temp (24hrs), Avg:98.8 F (37.1 C), Min:98.8 F (37.1 C), Max:98.8 F (37.1 C)   Recent Labs Lab 07/30/16 1557  WBC 11.0*  CREATININE 0.73    Estimated Creatinine Clearance: 78.8 mL/min (by C-G formula based on SCr of 0.73 mg/dL).    Allergies  Allergen Reactions  . Diphenhydramine Hcl Itching    Antimicrobials this admission: 12/23 ceftriaxone x1 12/23 clinda x1 12/23 vancomycin>>   Microbiology results: 12/23 BCx:    Thank you for allowing pharmacy to be a part of this patient's care.  Arley PhenixEllen Mitzie [name] RPh 07/30/2016, 6:26 PM Pager [phone]

## 2016-07-30 NOTE — H&P (Signed)
[name] [name] ZOX:[id number] DOB: 31-Mar-1966 DOA: 07/30/2016     WJX:BJYN Outpatient Specialists: none  Patient coming from:   home Lives  With family    Chief Complaint: Sore throat  HPI: [name] [name] is a 50 y.o. female with medical history significant of HTN, history of alcohol abuse  Bipolar disorder, asthma, GERD  Presented with one-week history of progressive right-sided throat soreness trouble swallowing and now development of neck swelling. Patient initially developed right sided sore throat rhinorrhea tear pain and nasal congestion she also noticed that she have had some drooling at night. This was associated with productive cough she feels that the side right side of her neck and swollen. He has saliva has been pulling at her mouth because it's too painful to swallow. She has been able to drink fluids but only was significant pain. She hadn't had any fever no shortness of breath. Denies hot potato voice. Voice has been hoarse though she has had sick contacts from her grandchildren. She has been evaluated in emergency department for this on the 20th as well as yesterday was diagnosed with viral pharyngitis after rapid strep has been negative. Day she presents again because her pain has become severe 10 out of 10 and now voice has become more like hot potato voice she started on antibiotics yesterday but didn't seem to help. While in emergency department during prior visits she had received Rocephin IV as well as Toradol but she still continues to be symptomatic she is now unable to tolerate by mouth at all. Still have not had any fevers. After administration of morphine patient feels much better able to speak in a normal voice states her pain is almost resolved   Regarding pertinent Chronic problems: Other than that patient had had some orthopedics procedures to repair anterior cruciate ligament tear he has been hospitalized for this in 2015 Examination does has history of  alcohol use has been admitted due to and alcohol intoxication to emergency department in July 4 she uses alcohol to control her chronic back  pain  IN ER:  Temp (24hrs), Avg:98.9 F (37.2 C), Min:98.8 F (37.1 C), Max:99 F (37.2 C)     RR 16 HR 67BP 148/91  WBC 11 Hg 10.8  k 3.3  CT showing Diffuse enlargement lymphoid tissue of Waldeyer's ring with diffuse inflammation most notable involving right palatine tonsil with extension into the supraglottic region with narrowing of the piriform sinus and thickening of the aryepiglottic folds. With this enlarged tonsillar tissue, there is narrowing of the air column.  Following Medications were ordered in ER: Medications  vancomycin (VANCOCIN) IVPB 1000 mg/200 mL premix (not administered)  morphine 4 MG/ML injection 4 mg (not administered)  vancomycin (VANCOCIN) IVPB 750 mg/150 ml premix (not administered)  sodium chloride 0.9 % bolus 1,000 mL (0 mLs Intravenous Stopped 07/30/16 1826)  clindamycin (CLEOCIN) IVPB 900 mg (0 mg Intravenous Stopped 07/30/16 1752)  dexamethasone (DECADRON) injection 10 mg (10 mg Intravenous Given 07/30/16 1719)  ketorolac (TORADOL) 15 MG/ML injection 15 mg (15 mg Intravenous Given 07/30/16 1719)  iopamidol (ISOVUE-300) 61 % injection 75 mL (75 mLs Intravenous Contrast Given 07/30/16 1737)  cefTRIAXone (ROCEPHIN) 1 g in dextrose 5 % 50 mL IVPB (1 g Intravenous New Bag/Given 07/30/16 1823)  0.9 %  sodium chloride infusion ( Intravenous New Bag/Given 07/30/16 1826)     ER provider discussed case with: ENT who recommended patient to be admitted to step down to [facility] and they  will see in consult  Hospitalist was called for admission for retropharyngeal swelling resulted in airway compromise  Review of Systems:    Pertinent positives include: ear ache, nasal congestion, post nasal drip,  Difficulty swallowing, Sore throat,  Constitutional:  No weight loss, night sweats, Fevers, chills, fatigue, weight  loss  HEENT:  No headaches,Tooth/dental problems, No sneezing, itching,  Cardio-vascular:  No chest pain, Orthopnea, PND, anasarca, dizziness, palpitations.no Bilateral lower extremity swelling  GI:  No heartburn, indigestion, abdominal pain, nausea, vomiting, diarrhea, change in bowel habits, loss of appetite, melena, blood in stool, hematemesis Resp:  no shortness of breath at rest. No dyspnea on exertion, No excess mucus, no productive cough, No non-productive cough, No coughing up of blood.No change in color of mucus.No wheezing. Skin:  no rash or lesions. No jaundice GU:  no dysuria, change in color of urine, no urgency or frequency. No straining to urinate.  No flank pain.  Musculoskeletal:  No joint pain or no joint swelling. No decreased range of motion. No back pain.  Psych:  No change in mood or affect. No depression or anxiety. No memory loss.  Neuro: no localizing neurological complaints, no tingling, no weakness, no double vision, no gait abnormality, no slurred speech, no confusion  As per HPI otherwise 10 point review of systems negative.   Past Medical History: Past Medical History:  Diagnosis Date  . Asthma   . Bipolar 1 disorder (HCC)   . Chronic lumbar pain   . DDD (degenerative disc disease)   . Depression   . GERD (gastroesophageal reflux disease)   . History of gastric ulcer   . Hyperlipidemia   . Hypertension   . Migraine   . Osteoarthritis   . Right ACL tear   . Right knee meniscal tear   . Sciatica   . Wears glasses    Past Surgical History:  Procedure Laterality Date  . ABDOMINAL HYSTERECTOMY    . BUNIONECTOMY Left 2011  . HEEL SPUR SURGERY Right 2012  . KNEE ARTHROSCOPY WITH ANTERIOR CRUCIATE LIGAMENT (ACL) REPAIR WITH HAMSTRING GRAFT Right 01/31/2014   Procedure: RIGHT KNEE ARTHROSCOPY WITH ALLOGRAFT (ACL) ANTERIOR CRUCIATE LIGAMENT RECONSTRUCTON PARTIAL MENISCECTOMY VERSES REPAIR ;  Surgeon: Eugenia Mcalpineobert Collins, MD;  [facility];  Service: Orthopedics;  Laterality: Right;  . TOTAL ABDOMINAL HYSTERECTOMY W/ BILATERAL SALPINGOOPHORECTOMY  04-20-2006     Social History:  Ambulatory  Independently     reports that she has been smoking Cigarettes.  She has a 16.00 pack-year smoking history. She has never used smokeless tobacco. She reports that she drinks about 8.4 oz of alcohol per week . She reports that she uses drugs, including Marijuana.  Allergies:   Allergies  Allergen Reactions  . Diphenhydramine Hcl Itching       Family History:   Family History  Problem Relation Age of Onset  . Hypertension Father     Medications: Prior to Admission medications   Medication Sig Start Date End Date Taking? Authorizing Provider  cephALEXin (KEFLEX) 500 MG capsule Take 1 capsule (500 mg total) by mouth 2 (two) times daily. 07/29/16   Emi HolesAlexandra M Law, PA-C  cholecalciferol (VITAMIN D) 1000 units tablet Take 1,000 Units by mouth daily.    Historical Provider, MD  famotidine (PEPCID) 40 MG tablet Take 1 tablet (40 mg total) by mouth at bedtime. Patient taking differently: Take 40 mg by mouth at bedtime as needed for heartburn or indigestion.  08/03/15   Carlene CoriaSerena Y Sam,  PA-C  gabapentin (NEURONTIN) 300 MG capsule Take 300 mg by mouth 3 (three) times daily as needed (for pain).     Historical Provider, MD  HYDROcodone-acetaminophen (HYCET) 7.5-325 mg/15 ml solution Take 15 mLs by mouth every 8 (eight) hours as needed for moderate pain. 07/27/16   Barrett Henle, PA-C  ibuprofen (ADVIL,MOTRIN) 600 MG tablet Take 1 tablet (600 mg total) by mouth every 6 (six) hours as needed. 07/27/16   Barrett Henle, PA-C  lisinopril-hydrochlorothiazide (PRINZIDE,ZESTORETIC) 20-12.5 MG per tablet Take 1 tablet by mouth every morning.     Historical Provider, MD  meloxicam (MOBIC) 15 MG tablet Take 15 mg by mouth daily.    Historical Provider, MD  Multiple Vitamins-Minerals (MULTIVITAMIN ADULT PO) Take 1 tablet by  mouth daily.    Historical Provider, MD  omeprazole (PRILOSEC) 40 MG capsule Take 1 capsule (40 mg total) by mouth daily. Patient taking differently: Take 40 mg by mouth daily as needed (heartburn/ acid reflux).  08/03/15   Ace Gins Sam, PA-C  pantoprazole (PROTONIX) 20 MG tablet Take 2 tablets (40 mg total) by mouth daily. Patient taking differently: Take 40 mg by mouth daily as needed for heartburn or indigestion.  06/07/15   Alvira Monday, MD    Physical Exam: Patient Vitals for the past 24 hrs:  BP Temp Temp src Pulse Resp SpO2  07/30/16 1830 148/91 99 F (37.2 C) Oral 67 16 99 %  07/30/16 1820 148/91 - - 77 18 98 %  07/30/16 1617 148/98 - - 86 16 100 %  07/30/16 1305 144/99 98.8 F (37.1 C) - 92 16 100 %  07/30/16 1300 - - - - - 98 %    1. General:  in No Acute distress 2. Psychological: Alert and Oriented 3. Head/ENT:     Dry Mucous Membranes                          Head Non traumatic, neck supple                           Poor Dentition 4. SKIN:   decreased Skin turgor,  Skin clean Dry and intact no rash 5. Heart: Regular rate and rhythm no  Murmur, Rub or gallop 6. Lungs:  Clear to auscultation bilaterally, no wheezes or crackles   7. Abdomen: Soft,  non-tender, Non distended, periumbilical hernia 8. Lower extremities: no clubbing, cyanosis, or edema 9. Neurologically Grossly intact, moving all 4 extremities equally 10. MSK: Normal range of motion   body mass index is unknown because there is no height or weight on file.  Labs on Admission:   Labs on Admission: I have personally reviewed following labs and imaging studies  CBC:  Recent Labs Lab 07/30/16 1557  WBC 11.0*  NEUTROABS 7.1  HGB 10.8*  HCT 31.2*  MCV 92.9  PLT 345   Basic Metabolic Panel:  Recent Labs Lab 07/30/16 1557  NA 140  K 3.3*  CL 106  CO2 26  GLUCOSE 96  BUN 11  CREATININE 0.73  CALCIUM 9.1   GFR: Estimated Creatinine Clearance: 78.8 mL/min (by C-G formula based on SCr of  0.73 mg/dL). Liver Function Tests: No results for input(s): AST, ALT, ALKPHOS, BILITOT, PROT, ALBUMIN in the last 168 hours. No results for input(s): LIPASE, AMYLASE in the last 168 hours. No results for input(s): AMMONIA in the last 168 hours. Coagulation Profile: No results  for input(s): INR, PROTIME in the last 168 hours. Cardiac Enzymes: No results for input(s): CKTOTAL, CKMB, CKMBINDEX, TROPONINI in the last 168 hours. BNP (last 3 results) No results for input(s): PROBNP in the last 8760 hours. HbA1C: No results for input(s): HGBA1C in the last 72 hours. CBG: No results for input(s): GLUCAP in the last 168 hours. Lipid Profile: No results for input(s): CHOL, HDL, LDLCALC, TRIG, CHOLHDL, LDLDIRECT in the last 72 hours. Thyroid Function Tests: No results for input(s): TSH, T4TOTAL, FREET4, T3FREE, THYROIDAB in the last 72 hours. Anemia Panel: No results for input(s): VITAMINB12, FOLATE, FERRITIN, TIBC, IRON, RETICCTPCT in the last 72 hours. Urine analysis:    Component Value Date/Time   COLORURINE YELLOW 03/31/2016 0853   APPEARANCEUR CLOUDY (A) 03/31/2016 0853   LABSPEC 1.013 03/31/2016 0853   PHURINE 6.5 03/31/2016 0853   GLUCOSEU NEGATIVE 03/31/2016 0853   HGBUR NEGATIVE 03/31/2016 0853   BILIRUBINUR NEGATIVE 03/31/2016 0853   KETONESUR NEGATIVE 03/31/2016 0853   PROTEINUR NEGATIVE 03/31/2016 0853   UROBILINOGEN 0.2 06/07/2015 1321   NITRITE NEGATIVE 03/31/2016 0853   LEUKOCYTESUR MODERATE (A) 03/31/2016 0853   Sepsis Labs: @LABRCNTIP (procalcitonin:4,lacticidven:4) ) Recent Results (from the past 240 hour(s))  Rapid strep screen     Status: None   Collection Time: 07/27/16 12:07 PM  Result Value Ref Range Status   Streptococcus, Group A Screen (Direct) NEGATIVE NEGATIVE Final    Comment: (NOTE) A Rapid Antigen test may result negative if the antigen level in the sample is below the detection level of this test. The FDA has not cleared this test as a stand-alone  test therefore the rapid antigen negative result has reflexed to a Group A Strep culture.   Culture, group A strep     Status: None   Collection Time: 07/27/16 12:07 PM  Result Value Ref Range Status   Specimen Description THROAT  Final   Special Requests NONE Reflexed from [id number]  Final   Culture   Final    NO GROUP A STREP (S.PYOGENES) ISOLATED Performed at [facility]    Report Status 07/29/2016 FINAL  Final  Rapid strep screen     Status: None   Collection Time: 07/29/16 10:41 AM  Result Value Ref Range Status   Streptococcus, Group A Screen (Direct) NEGATIVE NEGATIVE Final    Comment: (NOTE) A Rapid Antigen test may result negative if the antigen level in the sample is below the detection level of this test. The FDA has not cleared this test as a stand-alone test therefore the rapid antigen negative result has reflexed to a Group A Strep culture.   Culture, group A strep     Status: None (Preliminary result)   Collection Time: 07/29/16 10:41 AM  Result Value Ref Range Status   Specimen Description THROAT  Final   Special Requests NONE Reflexed from [id number]  Final   Culture   Final    CULTURE REINCUBATED FOR BETTER GROWTH Performed at [facility]    Report Status PENDING  Incomplete    UA not ordered  No results found for: HGBA1C  Estimated Creatinine Clearance: 78.8 mL/min (by C-G formula based on SCr of 0.73 mg/dL).  BNP (last 3 results) No results for input(s): PROBNP in the last 8760 hours.   ECG REPORTFrom December 19  Independently reviewed Rate: 66  Rhythm: NSR ST&T Change: No acute ischemic changes   QTC 423  There were no vitals filed for this visit.   Cultures:  Component Value Date/Time   SDES THROAT 07/29/2016 1041   SPECREQUEST NONE Reflexed from [id number] 07/29/2016 1041   CULT  07/29/2016 1041    CULTURE REINCUBATED FOR BETTER GROWTH [facility]formed [facility]    REPTSTATUS PENDING 07/29/2016 1041     Radiological  Exams on Admission: Ct Soft Tissue Neck W Contrast  Result Date: 07/30/2016 CLINICAL DATA:  50 year old female with increasing sore throat and productive cough for 5 days. Started antibiotics without relief. Initial encounter. EXAM: CT NECK WITH CONTRAST TECHNIQUE: Multidetector CT imaging of the neck was performed using the standard protocol following the bolus administration of intravenous contrast. CONTRAST:  75mL ISOVUE-300 IOPAMIDOL (ISOVUE-300) INJECTION 61% COMPARISON:  No comparison neck CT. Comparison cervical spine MR 01/30/2008 and head CT 10/29/2007. FINDINGS: Pharynx and larynx: Diffuse enlargement lymphoid tissue of Waldeyer's ring with diffuse inflammation most notable involving right palatine tonsil with extension into the supraglottic region with narrowing of the piriform sinus and thickening of the aryepiglottic folds. With this enlarged tonsillar tissue, there is narrowing of the air column. Breakthrough into right parapharyngeal space and retropharyngeal space without deep drainable abscess. Tumor as cause for above described findings felt to be secondary less likely consideration given the patient's presenting symptoms. Salivary glands: No primary parotid or salivary gland abnormality. Thyroid: No thyroid lesion. Lymph nodes: Diffuse adenopathy greater on the right and most notable involving level 2 region and right lateral retropharyngeal node. Vascular: No septic thrombophlebitis. Limited intracranial: Negative. Visualized orbits: Negative. Mastoids and visualized paranasal sinuses: Clear. Skeleton: Cervical spondylotic changes C3-4 thru C6-7. Upper chest: No worrisome lung lesion. Other: Negative. IMPRESSION: Diffuse enlargement lymphoid tissue of Waldeyer's ring with diffuse inflammation most notable involving right palatine tonsil with extension into the supraglottic region with narrowing of the piriform sinus and thickening of the aryepiglottic folds. With this enlarged tonsillar  tissue, there is narrowing of the air column. Breakthrough into right parapharyngeal space and retropharyngeal space without deep drainable abscess. Tumor as cause for above described findings felt to be secondary less likely consideration given the patient's presenting symptoms. Diffuse adenopathy greater on the right and most notable involving level 2 region and right lateral retropharyngeal node. These results were called by telephone at the time of interpretation on 07/30/2016 at 5:53 pm to Dr. Shaune PollackAMERON ISAACS, who verbally acknowledged these results. Electronically Signed   By: Lacy DuverneySteven  Olson M.D.   On: 07/30/2016 17:59    Chart has been reviewed    Assessment/Plan  50 y.o. female with medical history significant of HTN, history of alcohol abuse  Bipolar disorder, asthma, GERD admitted to stepdown due to  retropharyngeal swelling resulting in transient   inability to clear secretions  Present on Admission: . Retropharyngeal abscess  - continue  broad-spectrum antibiotics started in the emergency department until able to obtain cultures. patient's currently states she is feeling much better requesting something to eat no evidence of shortness of breath or dysphonia . Appreciate ENT consult. [facility] . Hypokalemia - will replace check magnesium level  History of alcohol abuse we'll monitor for any signs of withdrawal will ORDER ciwa . Dehydration administer IV fluids and monitor    Other plan as per orders.  DVT prophylaxis:    Lovenox     Code Status:    Full code as per patient   Family Communication:   Family not  at  Bedside    Disposition Plan:     To home once  workup is complete and patient is stable                                                                   Consults called: ENT will see on arrival to [facility]  Admission status:    INPATIENT    Level of care   sTEPDWON        I have spent a total of 56 min on  this admission   Chena Chohan 07/30/2016, 8:27 PM    [facility]  Pager [phone]   after 2 AM please page floor coverage PA If 7AM-7PM, please contact the day team taking care of the patient  [email or web address]  Password [email or web address]

## 2016-07-30 NOTE — ED Triage Notes (Addendum)
Per EMS, Pt, from home, c/o increasing sore throat and mucus production x 5 days.  Pain score 10/10.  "Hot potato" voice noted.  Pt has been seen several times for same.  Pt was started on an antibiotic yesterday w/o relief.

## 2016-07-31 DIAGNOSIS — F1010 Alcohol abuse, uncomplicated: Secondary | ICD-10-CM

## 2016-07-31 DIAGNOSIS — K219 Gastro-esophageal reflux disease without esophagitis: Secondary | ICD-10-CM

## 2016-07-31 LAB — CBC
HCT: 29.3 % — ABNORMAL LOW (ref 36.0–46.0)
Hemoglobin: 10.2 g/dL — ABNORMAL LOW (ref 12.0–15.0)
MCH: 32.3 pg (ref 26.0–34.0)
MCHC: 34.8 g/dL (ref 30.0–36.0)
MCV: 92.7 fL (ref 78.0–100.0)
Platelets: 326 10*3/uL (ref 150–400)
RBC: 3.16 MIL/uL — ABNORMAL LOW (ref 3.87–5.11)
RDW: 12.9 % (ref 11.5–15.5)
WBC: 8.7 10*3/uL (ref 4.0–10.5)

## 2016-07-31 LAB — COMPREHENSIVE METABOLIC PANEL
ALT: 56 U/L — ABNORMAL HIGH (ref 14–54)
AST: 77 U/L — ABNORMAL HIGH (ref 15–41)
Albumin: 3 g/dL — ABNORMAL LOW (ref 3.5–5.0)
Alkaline Phosphatase: 107 U/L (ref 38–126)
Anion gap: 8 (ref 5–15)
BUN: 15 mg/dL (ref 6–20)
CO2: 22 mmol/L (ref 22–32)
Calcium: 8.5 mg/dL — ABNORMAL LOW (ref 8.9–10.3)
Chloride: 108 mmol/L (ref 101–111)
Creatinine, Ser: 0.73 mg/dL (ref 0.44–1.00)
GFR calc Af Amer: >60 mL/min (ref >60)
GFR calc non Af Amer: >60 mL/min (ref >60)
Glucose, Bld: 117 mg/dL — ABNORMAL HIGH (ref 65–99)
Potassium: 3.4 mmol/L — ABNORMAL LOW (ref 3.5–5.1)
Sodium: 138 mmol/L (ref 135–145)
Total Bilirubin: 0.3 mg/dL (ref 0.3–1.2)
Total Protein: 6.8 g/dL (ref 6.5–8.1)

## 2016-07-31 LAB — MAGNESIUM: Magnesium: 1.9 mg/dL (ref 1.7–2.4)

## 2016-07-31 LAB — RETICULOCYTES
RBC.: 2.97 MIL/uL — ABNORMAL LOW (ref 3.87–5.11)
Retic Count, Absolute: 26.7 10*3/uL (ref 19.0–186.0)
Retic Ct Pct: 0.9 % (ref 0.4–3.1)

## 2016-07-31 LAB — MRSA PCR SCREENING: MRSA by PCR: NEGATIVE

## 2016-07-31 LAB — FOLATE: Folate: 18.6 ng/mL (ref >5.9)

## 2016-07-31 LAB — VITAMIN B12: Vitamin B-12: 244 pg/mL (ref 180–914)

## 2016-07-31 LAB — RAPID URINE DRUG SCREEN, HOSP PERFORMED
Amphetamines: NOT DETECTED
Barbiturates: NOT DETECTED
Benzodiazepines: NOT DETECTED
Cocaine: NOT DETECTED
Opiates: POSITIVE — AB
Tetrahydrocannabinol: POSITIVE — AB

## 2016-07-31 LAB — IRON AND TIBC
Iron: 60 ug/dL (ref 28–170)
Saturation Ratios: 20 % (ref 10.4–31.8)
TIBC: 300 ug/dL (ref 250–450)
UIBC: 240 ug/dL

## 2016-07-31 LAB — CULTURE, GROUP A STREP (THRC)

## 2016-07-31 LAB — TSH: TSH: 0.502 u[IU]/mL (ref 0.350–4.500)

## 2016-07-31 LAB — FERRITIN: Ferritin: 165 ng/mL (ref 11–307)

## 2016-07-31 LAB — PHOSPHORUS: Phosphorus: 2.7 mg/dL (ref 2.5–4.6)

## 2016-07-31 LAB — HIV ANTIBODY (ROUTINE TESTING W REFLEX): HIV Screen 4th Generation wRfx: NONREACTIVE

## 2016-07-31 MED ORDER — ALBUTEROL SULFATE (2.5 MG/3ML) 0.083% IN NEBU: 2.5000 mg | INHALATION_SOLUTION | RESPIRATORY_TRACT | Status: DC | PRN

## 2016-07-31 MED ORDER — HYDROMORPHONE HCL 1 MG/ML IJ SOLN: 0.5000 mg | INTRAMUSCULAR | Status: DC | PRN

## 2016-07-31 MED ORDER — SODIUM CHLORIDE 0.9 % IV BOLUS (SEPSIS)
1000.0000 mL | Freq: Once | INTRAVENOUS | Status: AC
  Administered 2016-07-31: 1000 mL via INTRAVENOUS

## 2016-07-31 MED ORDER — THIAMINE HCL 100 MG/ML IJ SOLN
100.0000 mg | Freq: Every day | INTRAMUSCULAR | Status: DC
  Administered 2016-07-31: 100 mg via INTRAVENOUS
  Filled 2016-07-31: qty 2

## 2016-07-31 MED ORDER — ACETAMINOPHEN 325 MG PO TABS: 650.0000 mg | ORAL_TABLET | Freq: Four times a day (QID) | ORAL | Status: DC | PRN

## 2016-07-31 MED ORDER — CLINDAMYCIN PHOSPHATE 600 MG/50ML IV SOLN
600.0000 mg | Freq: Three times a day (TID) | INTRAVENOUS | Status: DC
  Administered 2016-07-31 – 2016-08-01 (×4): 600 mg via INTRAVENOUS
  Filled 2016-07-31 (×4): qty 50

## 2016-07-31 MED ORDER — HYDROCOD POLST-CPM POLST ER 10-8 MG/5ML PO SUER
5.0000 mL | Freq: Two times a day (BID) | ORAL | Status: DC | PRN
  Administered 2016-07-31 – 2016-08-01 (×2): 5 mL via ORAL
  Filled 2016-07-31 (×2): qty 5

## 2016-07-31 MED ORDER — DEXAMETHASONE SODIUM PHOSPHATE 10 MG/ML IJ SOLN
10.0000 mg | Freq: Four times a day (QID) | INTRAMUSCULAR | Status: DC
  Administered 2016-07-31 – 2016-08-01 (×3): 10 mg via INTRAVENOUS
  Filled 2016-07-31 (×5): qty 1

## 2016-07-31 MED ORDER — HYDROXYZINE HCL 25 MG PO TABS: 25.0000 mg | ORAL_TABLET | Freq: Three times a day (TID) | ORAL | Status: DC | PRN

## 2016-07-31 MED ORDER — SODIUM CHLORIDE 0.9 % IV SOLN
30.0000 meq | Freq: Once | INTRAVENOUS | Status: AC
  Administered 2016-07-31: 30 meq via INTRAVENOUS
  Filled 2016-07-31: qty 15

## 2016-07-31 MED ORDER — SODIUM CHLORIDE 0.9% FLUSH
3.0000 mL | Freq: Two times a day (BID) | INTRAVENOUS | Status: DC
  Administered 2016-07-31 – 2016-08-01 (×2): 3 mL via INTRAVENOUS

## 2016-07-31 MED ORDER — ONDANSETRON HCL 4 MG PO TABS: 4.0000 mg | ORAL_TABLET | Freq: Four times a day (QID) | ORAL | Status: DC | PRN

## 2016-07-31 MED ORDER — CLINDAMYCIN PHOSPHATE 600 MG/50ML IV SOLN: 600.0000 mg | Freq: Four times a day (QID) | INTRAVENOUS | Status: DC

## 2016-07-31 MED ORDER — KETOROLAC TROMETHAMINE 30 MG/ML IJ SOLN
30.0000 mg | Freq: Four times a day (QID) | INTRAMUSCULAR | Status: DC | PRN
  Administered 2016-07-31 – 2016-08-01 (×4): 30 mg via INTRAVENOUS
  Filled 2016-07-31 (×4): qty 1

## 2016-07-31 MED ORDER — ACETAMINOPHEN 650 MG RE SUPP: 650.0000 mg | Freq: Four times a day (QID) | RECTAL | Status: DC | PRN

## 2016-07-31 MED ORDER — FOLIC ACID 5 MG/ML IJ SOLN
1.0000 mg | Freq: Every day | INTRAMUSCULAR | Status: DC
  Administered 2016-07-31: 1 mg via INTRAVENOUS
  Filled 2016-07-31 (×2): qty 0.2

## 2016-07-31 MED ORDER — SODIUM CHLORIDE 0.9 % IV SOLN
INTRAVENOUS | Status: AC
  Administered 2016-07-31: 22:00:00 via INTRAVENOUS

## 2016-07-31 MED ORDER — DEXAMETHASONE SODIUM PHOSPHATE 10 MG/ML IJ SOLN
10.0000 mg | Freq: Two times a day (BID) | INTRAMUSCULAR | Status: DC
  Administered 2016-07-31: 10 mg via INTRAVENOUS
  Filled 2016-07-31: qty 1

## 2016-07-31 MED ORDER — ONDANSETRON HCL 4 MG/2ML IJ SOLN: 4.0000 mg | Freq: Four times a day (QID) | INTRAMUSCULAR | Status: DC | PRN

## 2016-07-31 NOTE — Progress Notes (Signed)
Diet changed to regular, monitor closely, VSS so far, CBC and BMP in AM. Continue IV ABX.  [name] PrestoMAGICK-Jacobey Gura, MD  [facility] Pager [phone]  If 7PM-7AM, please contact night-coverage [email or web address] Password [email or web address]

## 2016-07-31 NOTE — Progress Notes (Signed)
Pt has asymptomatic SB in 45-49s. Text paged dr: per Md, pt will remain on telemetry and will take an EKG if pt experiences chest pains.

## 2016-07-31 NOTE — Progress Notes (Signed)
PHARMACY NOTE:  ANTIMICROBIAL RENAL DOSAGE ADJUSTMENT  Current antimicrobial regimen includes a mismatch between antimicrobial dosage and estimated renal function.  As per policy approved by the Pharmacy & Therapeutics and Medical Executive Committees, the antimicrobial dosage will be adjusted accordingly.  Current antimicrobial dosage:  Clindamycin 600mg  q8h  Indication: retropharengial infection  Renal Function:  Estimated Creatinine Clearance: 78.8 mL/min (by C-G formula based on SCr of 0.73 mg/dL).     Antimicrobial dosage has been changed to:  Clindamycin 600mg  IV q8h   Thank you for allowing pharmacy to be a part of this patient's care.  Lynann Beaverhristine Melissa Tomaselli PharmD, BCPS Pager [phone] 07/31/2016 8:05 AM

## 2016-07-31 NOTE — Progress Notes (Signed)
PROGRESS NOTE  [name] RippleStephanie L [name]  ZOX:[id number] DOB: [birth year] DOA: 07/30/2016 PCP: No PCP Per Patient   Brief Narrative: [name] [name] is a 50 y.o. female with a history of asthma, alcohol abuse, chronic back pain, HTN, bipolar disorder who [facility] for sore throat, odynophagia, and neck swelling. Also had associated saliva pooling/drooling and worsening development of voice changes. She had sought care in the ED twice in the week prior to admission for similar complaints. With negative rapid strep and stable vital signs, she was given ceftriaxone and toradol and discharged. In the ED she was in pain but no respiratory distress. No stridor documented. Tmax was 56F, RR 16/min, HR 67bpm, BP 148/91. WBC 11. CT showed diffuse lymphoid inflammation prominently in right palatine tonsillar tissue causing narrowing of the air column. Broad spectrum antibiotics were started, IV fluids given, and ENT was consulted, recommending [facility] and admission to Hospitalist service with ENT to consult. She has remained in the ED due to no bed availability.   Assessment & Plan: Active Problems:   GERD   Retropharyngeal abscess   Hypokalemia   Dehydration   Alcohol abuse  Retropharyngeal abscess: With evidence of air column compromise on CT. ENT suspects primarily viral process. Not septic. - Plan to transfer to SDU pending bed availability, requires very close monitoring of respiratory status. Hemodynamically stable.  - Continue vital signs with pulse oximetry q1h and continuous pulse ox when in SDU.  - Broad spectrum antibiotics: vanc/clinda - IV steroids: decadron 10mg  BID - Monitor blood cultures - Appreciate ENT recommendations: no surgical indications at this point.   Dehydration: Due to poor po related to abscess.  - Continue IV fluids  Anemia: Normocytic, hgb 10.8 on admission.  - Will release admission orders for AM labs which include anemia work up.   History of EtOH  abuse: Stopped drinking 1 week ago w/illness onset. Reports mild tremors that have been improving, otherwise no evidence of current withdrawal.  - CIWA ordered - Cessation counseling provided, pt remains precontemplative  Hypokalemia: Mild, K 3.3 on admission - Follow up AM CMP, Mg, Phos  DVT prophylaxis: Lovenox Code Status: Full Family Communication: None at bedside this AM Disposition Plan: [facility] SD[facility] with ENT consultation.   Consultants:   ENT, Dr. Jearld FentonByers  Procedures:   None  Antimicrobials:  Vancomycin 12/23 >>   Clindamycin 12/23 >>   Subjective: Pt still with some voice changes, no drooling or trouble breathing this morning. No fevers. Ate ham sandwich and has drunk some water with some pain with swallowing.   Objective: Vitals:   07/31/16 0100 07/31/16 0355 07/31/16 0559 07/31/16 0620  BP: 142/95 125/69 130/82 138/89  Pulse: (!) 50 64 60 64  Resp: 19 18 18 20   Temp:      TempSrc:      SpO2: 100% 99% 98% 99%    Intake/Output Summary (Last 24 hours) at 07/31/16 0709 Last data filed at 07/30/16 1826  Gross per 24 hour  Intake             1050 ml  Output                0 ml  Net             1050 ml   There were no vitals filed for this visit.  Examination: General exam: 50 y.o. female in no distress HEENT: Poor dentition, lingual leukoplakia, erythematous oropharynx with enlarged tonsils R >  L with incomplete airway obstruction. Respiratory system: Non-labored breathing room air, 99%. Clear to auscultation bilaterally without stridor.  Cardiovascular system: Regular rate and rhythm. No murmur, rub, or gallop. No JVD, and no pedal edema. Gastrointestinal system: Abdomen soft, non-tender, non-distended, with normoactive bowel sounds. No organomegaly or masses felt. Central nervous system: Alert and oriented. No focal neurological deficits. Extremities: Warm, no deformities Skin: No rashes, lesions or ulcers Psychiatry: Judgement and insight appear  normal. Mood & affect appropriate.   Data Reviewed: I have personally reviewed following labs and imaging studies  CBC:  Recent Labs Lab 07/30/16 1557  WBC 11.0*  NEUTROABS 7.1  HGB 10.8*  HCT 31.2*  MCV 92.9  PLT 345   Basic Metabolic Panel:  Recent Labs Lab 07/30/16 1557  NA 140  K 3.3*  CL 106  CO2 26  GLUCOSE 96  BUN 11  CREATININE 0.73  CALCIUM 9.1   GFR: Estimated Creatinine Clearance: 78.8 mL/min (by C-G formula based on SCr of 0.73 mg/dL). Liver Function Tests: No results for input(s): AST, ALT, ALKPHOS, BILITOT, PROT, ALBUMIN in the last 168 hours. No results for input(s): LIPASE, AMYLASE in the last 168 hours. No results for input(s): AMMONIA in the last 168 hours. Coagulation Profile: No results for input(s): INR, PROTIME in the last 168 hours. Cardiac Enzymes: No results for input(s): CKTOTAL, CKMB, CKMBINDEX, TROPONINI in the last 168 hours. BNP (last 3 results) No results for input(s): PROBNP in the last 8760 hours. HbA1C: No results for input(s): HGBA1C in the last 72 hours. CBG: No results for input(s): GLUCAP in the last 168 hours. Lipid Profile: No results for input(s): CHOL, HDL, LDLCALC, TRIG, CHOLHDL, LDLDIRECT in the last 72 hours. Thyroid Function Tests: No results for input(s): TSH, T4TOTAL, FREET4, T3FREE, THYROIDAB in the last 72 hours. Anemia Panel: No results for input(s): VITAMINB12, FOLATE, FERRITIN, TIBC, IRON, RETICCTPCT in the last 72 hours. Urine analysis:    Component Value Date/Time   COLORURINE YELLOW 03/31/2016 0853   APPEARANCEUR CLOUDY (A) 03/31/2016 0853   LABSPEC 1.013 03/31/2016 0853   PHURINE 6.5 03/31/2016 0853   GLUCOSEU NEGATIVE 03/31/2016 0853   HGBUR NEGATIVE 03/31/2016 0853   BILIRUBINUR NEGATIVE 03/31/2016 0853   KETONESUR NEGATIVE 03/31/2016 0853   PROTEINUR NEGATIVE 03/31/2016 0853   UROBILINOGEN 0.2 06/07/2015 1321   NITRITE NEGATIVE 03/31/2016 0853   LEUKOCYTESUR MODERATE (A) 03/31/2016 0853    Sepsis Labs: @LABRCNTIP (procalcitonin:4,lacticidven:4)  ) Recent Results (from the past 240 hour(s))  Rapid strep screen     Status: None   Collection Time: 07/27/16 12:07 PM  Result Value Ref Range Status   Streptococcus, Group A Screen (Direct) NEGATIVE NEGATIVE Final    Comment: (NOTE) A Rapid Antigen test may result negative if the antigen level in the sample is below the detection level of this test. The FDA has not cleared this test as a stand-alone test therefore the rapid antigen negative result has reflexed to a Group A Strep culture.   Culture, group A strep     Status: None   Collection Time: 07/27/16 12:07 PM  Result Value Ref Range Status   Specimen Description THROAT  Final   Special Requests NONE Reflexed from [id number]  Final   Culture   Final    NO GROUP A STREP (S.PYOGENES) ISOLATED [facility]    Report Status 07/29/2016 FINAL  Final  Rapid strep screen     Status: None   Collection Time: 07/29/16 10:41 AM  Result Value  Ref Range Status   Streptococcus, Group A Screen (Direct) NEGATIVE NEGATIVE Final    Comment: (NOTE) A Rapid Antigen test may result negative if the antigen level in the sample is below the detection level of this test. The FDA has not cleared this test as a stand-alone test therefore the rapid antigen negative result has reflexed to a Group A Strep culture.   Culture, group A strep     Status: None (Preliminary result)   Collection Time: 07/29/16 10:41 AM  Result Value Ref Range Status   Specimen Description THROAT  Final   Special Requests NONE Reflexed from [id number]  Final   Culture   Final    CULTURE REINCUBATED FOR BETTER GROWTH Performed at [facility]    Report Status PENDING  Incomplete  Blood culture (routine x 2)     Status: None (Preliminary result)   Collection Time: 07/30/16  6:18 PM  Result Value Ref Range Status   Specimen Description BLOOD RIGHT ANTECUBITAL  Final   Special Requests BOTTLES  DRAWN AEROBIC AND ANAEROBIC 5CC  Final   Culture PENDING  Incomplete   Report Status PENDING  Incomplete     Radiology Studies: Ct Soft Tissue Neck W Contrast  Result Date: 07/30/2016 CLINICAL DATA:  50 year old female with increasing sore throat and productive cough for 5 days. Started antibiotics without relief. Initial encounter. EXAM: CT NECK WITH CONTRAST TECHNIQUE: Multidetector CT imaging of the neck was performed using the standard protocol following the bolus administration of intravenous contrast. CONTRAST:  75mL ISOVUE-300 IOPAMIDOL (ISOVUE-300) INJECTION 61% COMPARISON:  No comparison neck CT. Comparison cervical spine MR 01/30/2008 and head CT 10/29/2007. FINDINGS: Pharynx and larynx: Diffuse enlargement lymphoid tissue of Waldeyer's ring with diffuse inflammation most notable involving right palatine tonsil with extension into the supraglottic region with narrowing of the piriform sinus and thickening of the aryepiglottic folds. With this enlarged tonsillar tissue, there is narrowing of the air column. Breakthrough into right parapharyngeal space and retropharyngeal space without deep drainable abscess. Tumor as cause for above described findings felt to be secondary less likely consideration given the patient's presenting symptoms. Salivary glands: No primary parotid or salivary gland abnormality. Thyroid: No thyroid lesion. Lymph nodes: Diffuse adenopathy greater on the right and most notable involving level 2 region and right lateral retropharyngeal node. Vascular: No septic thrombophlebitis. Limited intracranial: Negative. Visualized orbits: Negative. Mastoids and visualized paranasal sinuses: Clear. Skeleton: Cervical spondylotic changes C3-4 thru C6-7. Upper chest: No worrisome lung lesion. Other: Negative. IMPRESSION: Diffuse enlargement lymphoid tissue of Waldeyer's ring with diffuse inflammation most notable involving right palatine tonsil with extension into the supraglottic region  with narrowing of the piriform sinus and thickening of the aryepiglottic folds. With this enlarged tonsillar tissue, there is narrowing of the air column. Breakthrough into right parapharyngeal space and retropharyngeal space without deep drainable abscess. Tumor as cause for above described findings felt to be secondary less likely consideration given the patient's presenting symptoms. Diffuse adenopathy greater on the right and most notable involving level 2 region and right lateral retropharyngeal node. These results were called by telephone at the time of interpretation on 07/30/2016 at 5:53 pm to Dr. Shaune Pollack, who verbally acknowledged these results. Electronically Signed   By: Lacy Duverney M.D.   On: 07/30/2016 17:59    Scheduled Meds: . vancomycin  750 mg Intravenous Q12H   Continuous Infusions:   LOS: 1 day   Time spent: 25 minutes.  Hazeline Junker, MD [facility] Pager [phone]  If 7PM-7AM, please contact night-coverage [email or web address] Password [email or web address] 07/31/2016, 7:09 AM

## 2016-08-01 DIAGNOSIS — J989 Respiratory disorder, unspecified: Secondary | ICD-10-CM

## 2016-08-01 LAB — BASIC METABOLIC PANEL
Anion gap: 4 — ABNORMAL LOW (ref 5–15)
BUN: 16 mg/dL (ref 6–20)
CO2: 25 mmol/L (ref 22–32)
Calcium: 8.6 mg/dL — ABNORMAL LOW (ref 8.9–10.3)
Chloride: 109 mmol/L (ref 101–111)
Creatinine, Ser: 0.82 mg/dL (ref 0.44–1.00)
GFR calc Af Amer: >60 mL/min (ref >60)
GFR calc non Af Amer: >60 mL/min (ref >60)
Glucose, Bld: 157 mg/dL — ABNORMAL HIGH (ref 65–99)
Potassium: 3.9 mmol/L (ref 3.5–5.1)
Sodium: 138 mmol/L (ref 135–145)

## 2016-08-01 LAB — CBC
HCT: 28.7 % — ABNORMAL LOW (ref 36.0–46.0)
Hemoglobin: 9.8 g/dL — ABNORMAL LOW (ref 12.0–15.0)
MCH: 31.7 pg (ref 26.0–34.0)
MCHC: 34.1 g/dL (ref 30.0–36.0)
MCV: 92.9 fL (ref 78.0–100.0)
Platelets: 312 10*3/uL (ref 150–400)
RBC: 3.09 MIL/uL — ABNORMAL LOW (ref 3.87–5.11)
RDW: 12.7 % (ref 11.5–15.5)
WBC: 6.9 10*3/uL (ref 4.0–10.5)

## 2016-08-01 MED ORDER — TRAMADOL HCL 50 MG PO TABS: 50.0000 mg | ORAL_TABLET | Freq: Four times a day (QID) | ORAL | 0 refills | Status: DC | PRN

## 2016-08-01 MED ORDER — CLINDAMYCIN HCL 300 MG PO CAPS
300.0000 mg | ORAL_CAPSULE | Freq: Three times a day (TID) | ORAL | 0 refills | Status: DC
Start: 2016-08-01 — End: 2017-11-23

## 2016-08-01 MED ORDER — HYDROCOD POLST-CPM POLST ER 10-8 MG/5ML PO SUER: 5.0000 mL | Freq: Two times a day (BID) | ORAL | 0 refills | Status: DC | PRN

## 2016-08-01 MED ORDER — VITAMIN B-1 100 MG PO TABS
100.0000 mg | ORAL_TABLET | Freq: Every day | ORAL | Status: DC
  Administered 2016-08-01: 100 mg via ORAL
  Filled 2016-08-01: qty 1

## 2016-08-01 MED ORDER — FOLIC ACID 1 MG PO TABS
1.0000 mg | ORAL_TABLET | Freq: Every day | ORAL | Status: DC
  Administered 2016-08-01: 1 mg via ORAL
  Filled 2016-08-01: qty 1

## 2016-08-01 NOTE — Progress Notes (Signed)
Subjective: She is doing great and her throat pain has completely resolved.  Objective: Vital signs in last 24 hours: Temp:  [97.9 F (36.6 C)-99 F (37.2 C)] 98.4 F (36.9 C) (12/25 0411) Pulse Rate:  [45-73] 61 (12/25 0411) Resp:  [12-23] 20 (12/25 0411) BP: (132-164)/(52-95) 145/95 (12/25 0411) SpO2:  [93 %-100 %] 93 % (12/25 0411) Weight:  [72.1 kg (158 lb 15.2 oz)] 72.1 kg (158 lb 15.2 oz) (12/24 1315) Last BM Date: 07/26/16  Intake/Output from previous day: 12/24 0701 - 12/25 0700 In: 1990 [I.V.:1125; IV Piggyback:865] Out: 1 [Urine:1] Intake/Output this shift: Total I/O In: 50 [IV Piggyback:50] Out: -   she is alert and very good mood. oc/op- the tonsils look smaller and she opens well. neck without swelling. voice is normal.  Lab Results:   Recent Labs  07/31/16 0821 08/01/16 0228  WBC 8.7 6.9  HGB 10.2* 9.8*  HCT 29.3* 28.7*  PLT 326 312   BMET  Recent Labs  07/31/16 0821 08/01/16 0228  NA 138 138  K 3.4* 3.9  CL 108 109  CO2 22 25  GLUCOSE 117* 157*  BUN 15 16  CREATININE 0.73 0.82  CALCIUM 8.5* 8.6*   PT/INR No results for input(s): LABPROT, INR in the last 72 hours. ABG No results for input(s): PHART, HCO3 in the last 72 hours.  Invalid input(s): PCO2, PO2  Studies/Results: Ct Soft Tissue Neck W Contrast  Result Date: 07/30/2016 CLINICAL DATA:  50 year old female with increasing sore throat and productive cough for 5 days. Started antibiotics without relief. Initial encounter. EXAM: CT NECK WITH CONTRAST TECHNIQUE: Multidetector CT imaging of the neck was performed using the standard protocol following the bolus administration of intravenous contrast. CONTRAST:  75mL ISOVUE-300 IOPAMIDOL (ISOVUE-300) INJECTION 61% COMPARISON:  No comparison neck CT. Comparison cervical spine MR 01/30/2008 and head CT 10/29/2007. FINDINGS: Pharynx and larynx: Diffuse enlargement lymphoid tissue of Waldeyer's ring with diffuse inflammation most notable  involving right palatine tonsil with extension into the supraglottic region with narrowing of the piriform sinus and thickening of the aryepiglottic folds. With this enlarged tonsillar tissue, there is narrowing of the air column. Breakthrough into right parapharyngeal space and retropharyngeal space without deep drainable abscess. Tumor as cause for above described findings felt to be secondary less likely consideration given the patient's presenting symptoms. Salivary glands: No primary parotid or salivary gland abnormality. Thyroid: No thyroid lesion. Lymph nodes: Diffuse adenopathy greater on the right and most notable involving level 2 region and right lateral retropharyngeal node. Vascular: No septic thrombophlebitis. Limited intracranial: Negative. Visualized orbits: Negative. Mastoids and visualized paranasal sinuses: Clear. Skeleton: Cervical spondylotic changes C3-4 thru C6-7. Upper chest: No worrisome lung lesion. Other: Negative. IMPRESSION: Diffuse enlargement lymphoid tissue of Waldeyer's ring with diffuse inflammation most notable involving right palatine tonsil with extension into the supraglottic region with narrowing of the piriform sinus and thickening of the aryepiglottic folds. With this enlarged tonsillar tissue, there is narrowing of the air column. Breakthrough into right parapharyngeal space and retropharyngeal space without deep drainable abscess. Tumor as cause for above described findings felt to be secondary less likely consideration given the patient's presenting symptoms. Diffuse adenopathy greater on the right and most notable involving level 2 region and right lateral retropharyngeal node. These results were called by telephone at the time of interpretation on 07/30/2016 at 5:53 pm to Dr. Shaune PollackAMERON ISAACS, who verbally acknowledged these results. Electronically Signed   By: Lacy DuverneySteven  Olson M.D.   On: 07/30/2016 17:59  Anti-infectives: Anti-infectives    Start     Dose/Rate Route  Frequency Ordered Stop   07/31/16 0800  clindamycin (CLEOCIN) IVPB 600 mg  Status:  Discontinued     600 mg 100 mL/hr over 30 Minutes Intravenous Every 6 hours 07/31/16 0724 07/31/16 0729   07/31/16 0800  clindamycin (CLEOCIN) IVPB 600 mg     600 mg 100 mL/hr over 30 Minutes Intravenous Every 8 hours 07/31/16 0729     07/31/16 0600  vancomycin (VANCOCIN) IVPB 750 mg/150 ml premix     750 mg 150 mL/hr over 60 Minutes Intravenous Every 12 hours 07/30/16 1830     07/30/16 1830  vancomycin (VANCOCIN) IVPB 1000 mg/200 mL premix     1,000 mg 200 mL/hr over 60 Minutes Intravenous  Once 07/30/16 1819 07/30/16 2055   07/30/16 1800  cefTRIAXone (ROCEPHIN) 1 g in dextrose 5 % 50 mL IVPB     1 g 100 mL/hr over 30 Minutes Intravenous  Once 07/30/16 1758 07/30/16 1853   07/30/16 1645  clindamycin (CLEOCIN) IVPB 900 mg     900 mg 100 mL/hr over 30 Minutes Intravenous  Once 07/30/16 1630 07/30/16 1752      Assessment/Plan: s/p * No surgery found * She seems substantially better with her throat and she can be discharged from my standpoint on clindamycin. Follow up if any recurrence or further symtoms.   LOS: 2 days    Suzanna ObeyBYERS, Yanis Larin 08/01/2016

## 2016-08-01 NOTE — Discharge Instructions (Signed)
Retropharyngeal Abscess A retropharyngeal abscess is a collection of pus (abscess) in the back of the throat. It is usually caused by a bacterial infection. This infection can make it difficult to swallow and breathe normally. This can be a serious condition that requires immediate medical attention. What are the causes? This condition is usually caused by a bacterial infection. In adults, a retropharyngeal abscess is often caused by a spine infection. In children, a retropharyngeal abscess is often caused by an upper respiratory illness. When a lymph node in the retropharyngeal area gets infected, it can become filled with pus and cause swelling. What are the signs or symptoms? Symptoms of this condition include:  Swelling, pain, and tenderness on one side or both sides of the back of the throat.  A high fever.  A stiff neck.  Trouble breathing.  Trouble swallowing.  Pain when swallowing.  A muffled voice. How is this diagnosed? This condition is diagnosed with a physical exam and X-rays or a CT scan. How is this treated? This condition is usually treated with:  Antibiotic medicines.  Incision and drainage. This is a procedure in which a surgical cut (incision) is made over the abscess to allow pus to drain out of it. Follow these instructions at home:  Take over-the-counter and prescription medicines only as told by your health care provider.  If you were prescribed an antibiotic medicine, take it as told by your health care provider. Do not stop taking the antibiotic even if you start to feel better.  Watch for any changes in your condition.  Keep all follow-up visits as told by your health care provider. This is important. Contact a health care provider if:  You cannot swallow food or liquids.  Your symptoms get worse.  You are starting to drool because of pain when you swallow.  You feel short of breath whenever you lie flat. Get help right away if:  Your symptoms  get worse.  You develop noisy breathing.  You develop muscle aches.  You develop a stiff neck.  You have a severe headache that does not get better with medicines.  You have a fever or chills. Summary  Retropharyngeal abscess is usually caused by a bacterial infection.  This infection should be treated immediately and should be considered a serious medical condition.  Treatment may include draining the abscess and prescribing antibiotic medicines.  Contact your health care provider if any of your symptoms get worse or they do not improve with treatment. This information is not intended to replace advice given to you by your health care provider. Make sure you discuss any questions you have with your health care provider. Document Released: 11/06/2000 Document Revised: 03/11/2016 Document Reviewed: 03/11/2016 Elsevier Interactive Patient Education  2017 [employer]

## 2016-08-01 NOTE — Progress Notes (Signed)
Pt was provided with discharge education and prescriptions. Pt expressed understanding and teach back. Pt discharged to home with belongings: cell phone and clothing.

## 2016-08-01 NOTE — Discharge Summary (Signed)
Physician Discharge Summary  Thomasene RippleStephanie L Ems RUE:[id number] DOB: 08/02/1966 DOA: 07/30/2016  PCP: No PCP Per Patient  Admit date: 07/30/2016 Discharge date: 08/01/2016  Recommendations for Outpatient Follow-up:  1. Pt will need to follow up with ENT doctor Dr. Jearld FentonByers in 1-2 weeks post discharge 2. Please obtain BMP to evaluate electrolytes and kidney function 3. Continue clindamycin to complete therapy upon discharge   Discharge Diagnoses:  Active Problems:   GERD   Retropharyngeal abscess   Hypokalemia   Dehydration   Alcohol abuse    Discharge Condition: Stable  Diet recommendation: Heart healthy diet discussed in details   History of present illness:  50 y.o. female presented with 5 day history of sore throat. She has had mostly the inability to swallow liquids or solids. She was started on antibiotic Keflex but her symptoms did not improve. She did have an upper respiratory infection at the time of onset.   Hospital Course:  Pharyngitis  - CT scan which shows diffuse swelling of most all the lymph tissue in her pharynx and the exam is consistent with this - suspect pt had a viral infection causing this but definitely antibiotic coverage is needed - no indication for a surgical intervention at this time. - per Dr. Jearld FentonByers, completer therapy with Clindamycin   HTN, essential - continue home medical regimen   Procedures/Studies: Ct Soft Tissue Neck W Contrast  Result Date: 07/30/2016 CLINICAL DATA:  50 year old female with increasing sore throat and productive cough for 5 days. Started antibiotics without relief. Initial encounter. EXAM: CT NECK WITH CONTRAST TECHNIQUE: Multidetector CT imaging of the neck was performed using the standard protocol following the bolus administration of intravenous contrast. CONTRAST:  75mL ISOVUE-300 IOPAMIDOL (ISOVUE-300) INJECTION 61% COMPARISON:  No comparison neck CT. Comparison cervical spine MR 01/30/2008 and head CT 10/29/2007.  FINDINGS: Pharynx and larynx: Diffuse enlargement lymphoid tissue of Waldeyer's ring with diffuse inflammation most notable involving right palatine tonsil with extension into the supraglottic region with narrowing of the piriform sinus and thickening of the aryepiglottic folds. With this enlarged tonsillar tissue, there is narrowing of the air column. Breakthrough into right parapharyngeal space and retropharyngeal space without deep drainable abscess. Tumor as cause for above described findings felt to be secondary less likely consideration given the patient's presenting symptoms. Salivary glands: No primary parotid or salivary gland abnormality. Thyroid: No thyroid lesion. Lymph nodes: Diffuse adenopathy greater on the right and most notable involving level 2 region and right lateral retropharyngeal node. Vascular: No septic thrombophlebitis. Limited intracranial: Negative. Visualized orbits: Negative. Mastoids and visualized paranasal sinuses: Clear. Skeleton: Cervical spondylotic changes C3-4 thru C6-7. Upper chest: No worrisome lung lesion. Other: Negative. IMPRESSION: Diffuse enlargement lymphoid tissue of Waldeyer's ring with diffuse inflammation most notable involving right palatine tonsil with extension into the supraglottic region with narrowing of the piriform sinus and thickening of the aryepiglottic folds. With this enlarged tonsillar tissue, there is narrowing of the air column. Breakthrough into right parapharyngeal space and retropharyngeal space without deep drainable abscess. Tumor as cause for above described findings felt to be secondary less likely consideration given the patient's presenting symptoms. Diffuse adenopathy greater on the right and most notable involving level 2 region and right lateral retropharyngeal node. These results were called by telephone at the time of interpretation on 07/30/2016 at 5:53 pm to Dr. Shaune PollackAMERON ISAACS, who verbally acknowledged these results. Electronically  Signed   By: Lacy DuverneySteven  Olson M.D.   On: 07/30/2016 17:59     Discharge Exam:  Vitals:   08/01/16 0411 08/01/16 0840  BP: (!) 145/95 (!) 173/103  Pulse: 61   Resp: 20   Temp: 98.4 F (36.9 C) 98.7 F (37.1 C)   Vitals:   07/31/16 1951 07/31/16 2320 08/01/16 0411 08/01/16 0840  BP: (!) 163/94 (!) 164/94 (!) 145/95 (!) 173/103  Pulse: (!) 55 (!) 45 61   Resp: 17 12 20    Temp: 98 F (36.7 C) 98 F (36.7 C) 98.4 F (36.9 C) 98.7 F (37.1 C)  TempSrc: Oral Oral Oral Oral  SpO2: 100% 95% 93%   Weight:      Height:        General: Pt is alert, follows commands appropriately, not in acute distress Cardiovascular: Regular rate and rhythm, S1/S2 +, no murmurs, no rubs, no gallops Respiratory: Clear to auscultation bilaterally, no wheezing, no crackles, no rhonchi Abdominal: Soft, non tender, non distended, bowel sounds +, no guarding   Discharge Instructions  Discharge Instructions    Diet - low sodium heart healthy    Complete by:  As directed    Increase activity slowly    Complete by:  As directed      Allergies as of 08/01/2016      Reactions   Diphenhydramine Hcl Itching   Morphine And Related Rash      Medication List    STOP taking these medications   cephALEXin 500 MG capsule Commonly known as:  KEFLEX   HYDROcodone-acetaminophen 7.5-325 mg/15 ml solution Commonly known as:  HYCET   ibuprofen 600 MG tablet Commonly known as:  ADVIL,MOTRIN   pantoprazole 20 MG tablet Commonly known as:  PROTONIX     TAKE these medications   chlorpheniramine-HYDROcodone 10-8 MG/5ML Suer Commonly known as:  TUSSIONEX Take 5 mLs by mouth every 12 (twelve) hours as needed for cough.   cholecalciferol 1000 units tablet Commonly known as:  VITAMIN D Take 1,000 Units by mouth daily.   clindamycin 300 MG capsule Commonly known as:  CLEOCIN Take 1 capsule (300 mg total) by mouth 3 (three) times daily.   famotidine 40 MG tablet Commonly known as:  PEPCID Take 1 tablet  (40 mg total) by mouth at bedtime. What changed:  when to take this  reasons to take this   lisinopril-hydrochlorothiazide 20-12.5 MG tablet Commonly known as:  PRINZIDE,ZESTORETIC Take 1 tablet by mouth every morning.   meloxicam 15 MG tablet Commonly known as:  MOBIC Take 15 mg by mouth daily.   MULTIVITAMIN ADULT PO Take 1 tablet by mouth daily.   omeprazole 40 MG capsule Commonly known as:  PRILOSEC Take 1 capsule (40 mg total) by mouth daily. What changed:  when to take this  reasons to take this   traMADol 50 MG tablet Commonly known as:  ULTRAM Take 1 tablet (50 mg total) by mouth every 6 (six) hours as needed.      Follow-up Information    MAGICK-Annabella Elford, Sherlon Handing, MD Follow up.   Specialty:  Internal Medicine Contact information: [address] [place] [address] [phone]            The results of significant diagnostics from this hospitalization (including imaging, microbiology, ancillary and laboratory) are listed below for reference.     Microbiology: Recent Results (from the past 240 hour(s))  Rapid strep screen     Status: None   Collection Time: 07/27/16 12:07 PM  Result Value Ref Range Status   Streptococcus, Group A Screen (Direct) NEGATIVE NEGATIVE Final  Comment: (NOTE) A Rapid Antigen test may result negative if the antigen level in the sample is below the detection level of this test. The FDA has not cleared this test as a stand-alone test therefore the rapid antigen negative result has reflexed to a Group A Strep culture.   Culture, group A strep     Status: None   Collection Time: 07/27/16 12:07 PM  Result Value Ref Range Status   Specimen Description THROAT  Final   Special Requests NONE Reflexed from [id number]  Final   Culture   Final    NO GROUP A STREP (S.PYOGENES) ISOLATED Performed at [facility] [place]    Report Status 07/29/2016 FINAL  Final  Rapid strep screen     Status: None   Collection  Time: 07/29/16 10:41 AM  Result Value Ref Range Status   Streptococcus, Group A Screen (Direct) NEGATIVE NEGATIVE Final    Comment: (NOTE) A Rapid Antigen test may result negative if the antigen level in the sample is below the detection level of this test. The FDA has not cleared this test as a stand-alone test therefore the rapid antigen negative result has reflexed to a Group A Strep culture.   Culture, group A strep     Status: None   Collection Time: 07/29/16 10:41 AM  Result Value Ref Range Status   Specimen Description THROAT  Final   Special Requests NONE Reflexed from [id number]  Final   Culture   Final    NO GROUP A STREP (S.PYOGENES) ISOLATED Performed at [facility] [place]    Report Status 07/31/2016 FINAL  Final  Blood culture (routine x 2)     Status: None (Preliminary result)   Collection Time: 07/30/16  6:18 PM  Result Value Ref Range Status   Specimen Description BLOOD RIGHT ANTECUBITAL  Final   Special Requests BOTTLES DRAWN AEROBIC AND ANAEROBIC 5CC  Final   Culture PENDING  Incomplete   Report Status PENDING  Incomplete  MRSA PCR Screening     Status: None   Collection Time: 07/31/16  2:49 PM  Result Value Ref Range Status   MRSA by PCR NEGATIVE NEGATIVE Final    Comment:        The GeneXpert MRSA Assay (FDA approved for NASAL specimens only), is one component of a comprehensive MRSA colonization surveillance program. It is not intended to diagnose MRSA infection nor to guide or monitor treatment for MRSA infections.      Labs: Basic Metabolic Panel:  Recent Labs Lab 07/30/16 1557 07/31/16 0821 08/01/16 0228  NA 140 138 138  K 3.3* 3.4* 3.9  CL 106 108 109  CO2 26 22 25   GLUCOSE 96 117* 157*  BUN 11 15 16   CREATININE 0.73 0.73 0.82  CALCIUM 9.1 8.5* 8.6*  MG  --  1.9  --   PHOS  --  2.7  --    Liver Function Tests:  Recent Labs Lab 07/31/16 0821  AST 77*  ALT 56*  ALKPHOS 107  BILITOT 0.3  PROT 6.8  ALBUMIN 3.0*    CBC:  Recent Labs Lab 07/30/16 1557 07/31/16 0821 08/01/16 0228  WBC 11.0* 8.7 6.9  NEUTROABS 7.1  --   --   HGB 10.8* 10.2* 9.8*  HCT 31.2* 29.3* 28.7*  MCV 92.9 92.7 92.9  PLT 345 326 312    SIGNED: Time coordinating discharge: 30 minutes  Debbora PrestoMAGICK-Jenita Rayfield, MD  [facility] 08/01/2016, 8:56 AM Pager [phone]  If 7PM-7AM, please contact night-coverage  [email or web address] Password [email or web address]

## 2016-08-01 NOTE — Progress Notes (Signed)
Informed Dr. Julien NordmannLangeland of pt HR in the 30's. Pt is currently sleeping and asymptomatic. After waking pt up HR goes back into the 40's. Pt voice no complaints at this time.  No new orders given. Will continue to monitor

## 2016-08-02 LAB — HEMOGLOBIN A1C
Hgb A1c MFr Bld: 5.4 % (ref 4.8–5.6)
Mean Plasma Glucose: 108 mg/dL

## 2016-08-05 LAB — CULTURE, BLOOD (ROUTINE X 2)
Culture: NO GROWTH
Culture: NO GROWTH

## 2017-05-30 ENCOUNTER — Encounter (HOSPITAL_COMMUNITY): Payer: Self-pay | Admitting: Emergency Medicine

## 2017-05-30 ENCOUNTER — Emergency Department (HOSPITAL_COMMUNITY)
Admission: EM | Admit: 2017-05-30 | Discharge: 2017-05-30 | Disposition: A | Discharge status: Home / Self Care | Payer: Self-pay | Attending: Emergency Medicine | Admitting: Emergency Medicine

## 2017-05-30 DIAGNOSIS — G8929 Other chronic pain: Secondary | ICD-10-CM

## 2017-05-30 DIAGNOSIS — F319 Bipolar disorder, unspecified: Secondary | ICD-10-CM | POA: Insufficient documentation

## 2017-05-30 DIAGNOSIS — J45909 Unspecified asthma, uncomplicated: Secondary | ICD-10-CM | POA: Insufficient documentation

## 2017-05-30 DIAGNOSIS — F17210 Nicotine dependence, cigarettes, uncomplicated: Secondary | ICD-10-CM | POA: Insufficient documentation

## 2017-05-30 DIAGNOSIS — I10 Essential (primary) hypertension: Secondary | ICD-10-CM

## 2017-05-30 DIAGNOSIS — Z79899 Other long term (current) drug therapy: Secondary | ICD-10-CM | POA: Insufficient documentation

## 2017-05-30 DIAGNOSIS — M545 Low back pain, unspecified: Secondary | ICD-10-CM

## 2017-05-30 MED ORDER — CYCLOBENZAPRINE HCL 10 MG PO TABS: 10.0000 mg | ORAL_TABLET | Freq: Two times a day (BID) | ORAL | 0 refills | Status: DC | PRN

## 2017-05-30 MED ORDER — IBUPROFEN 800 MG PO TABS
800.0000 mg | ORAL_TABLET | Freq: Once | ORAL | Status: AC
  Administered 2017-05-30: 800 mg via ORAL
  Filled 2017-05-30: qty 1

## 2017-05-30 MED ORDER — CYCLOBENZAPRINE HCL 10 MG PO TABS
10.0000 mg | ORAL_TABLET | Freq: Once | ORAL | Status: AC
  Administered 2017-05-30: 10 mg via ORAL
  Filled 2017-05-30: qty 1

## 2017-05-30 MED ORDER — LISINOPRIL-HYDROCHLOROTHIAZIDE 20-12.5 MG PO TABS: 1.0000 | ORAL_TABLET | Freq: Every day | ORAL | 0 refills | Status: DC

## 2017-05-30 NOTE — ED Triage Notes (Signed)
Pt reports lower back pain  X 2 weeks, Hx arthritis, out of meds due to finances and no insurance. Also reports needs of BP meds . Denies urinary symptoms, appeared in pain while ambulating to triage room.

## 2017-05-30 NOTE — Discharge Instructions (Signed)
Please tylenol for low back pain. I have also written you a prescription for Flexeril which is a muscle relaxer. Please do not drive or drink alcohol while taking this medication as it can make you drowsy. Continue to apply heat to the lower back for symptom improvement.  I have also written you a prescription for blood pressure. Please take this once a day.   I have listed the information to [facility]. Located across the street from [facility] . They accept patients who do not have insurance. Please call and schedule an appointment to establish care and for follow-up on blood pressure and chronic low back pain.  Return to the emergency department if you experience new back pain with fever, back pain in which you can no longer feel your feet or legs, back pain with difficulty urinating or any new or worsening symptoms.

## 2017-05-30 NOTE — ED Provider Notes (Signed)
[facility]-EMERGENCY DEPT Provider Note   CSN: [id number] Arrival date & time: 05/30/17  1249     History   Chief Complaint Chief Complaint  Patient presents with  . Back Pain    lower    HPI [name] [name] is a 51 y.o. female.  HPI   [name] [name] is a 51 year old female with a history of chronic low back pain, hypertension, migraine, asthma, GERD, depression, bipolar 1 disorder who presents to the emergency department with multiple complaints. Patient states that she has had chronic lower back pain for the past 16 years. States that she is having a "flare" of pain over the past 2 weeks. Reports her pain is located in the bilateral lower back and is a 10/10 in severity. She describes the pain as "throbbing and aching" in nature. Pain is constant, worsened with prolonged ambulation. It does not radiate to the legs. She was told that she has degenerative disc disease and spinal stenosis by her orthopedic doctor who treated her with hydrocodone in the past. She has not been able to see her orthopedist in years since she does not have insurance. She currently takes ibuprofen which helps her some. Denies fever, night sweats, unexpected weight change, numbness, weakness, saddle anesthesia, loss of bowel or bladder control, abdominal pain, pelvic pain. Denies history of cancer, denies IV drug use. No history of chronic steroid use. No recent injury. She is able to ambulate independently without difficulty.  Patient also states that she is out of her blood pressure medication. She checks her blood pressure at home and states that it is 180/90 on most mornings. Her blood pressure was elevated at 147/88 in the ER. Denies chest pain, shortness of breath, visual changes. She is asking for a refill on this medication until she can establish care with [facility].  Past Medical History:  Diagnosis Date  . Asthma   . Bipolar 1 disorder (HCC)   . Chronic lumbar pain   . DDD  (degenerative disc disease)   . Depression   . GERD (gastroesophageal reflux disease)   . History of gastric ulcer   . Hyperlipidemia   . Hypertension   . Migraine   . Osteoarthritis   . Right ACL tear   . Right knee meniscal tear   . Sciatica   . Wears glasses     Patient Active Problem List   Diagnosis Date Noted  . Airway problem   . 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 Hypokalemia 07/30/2016  . Dehydration 07/30/2016  . Alcohol abuse 07/30/2016  . S/P ACL reconstruction 01/31/2014  . DEPRESSION 11/17/2008  . ACUTE SINUSITIS, UNSPECIFIED 11/17/2008  . ALLERGIC RHINITIS 11/17/2008  . GERD 11/17/2008  . HEADACHE 11/17/2008    Past Surgical History:  Procedure Laterality Date  . ABDOMINAL HYSTERECTOMY    . BUNIONECTOMY Left 2011  . HEEL SPUR SURGERY Right 2012  . KNEE ARTHROSCOPY WITH ANTERIOR CRUCIATE LIGAMENT (ACL) REPAIR WITH HAMSTRING GRAFT Right 01/31/2014   Procedure: RIGHT KNEE ARTHROSCOPY WITH ALLOGRAFT (ACL) ANTERIOR CRUCIATE LIGAMENT RECONSTRUCTON PARTIAL MENISCECTOMY VERSES REPAIR ;  Surgeon: Eugenia Mcalpine, MD;  Location: [facility];  Service: Orthopedics;  Laterality: Right;  . TOTAL ABDOMINAL HYSTERECTOMY W/ BILATERAL SALPINGOOPHORECTOMY  04-20-2006    OB History    No data available       Home Medications    Prior to Admission medications   Medication Sig Start Date End Date Taking? Authorizing Provider  chlorpheniramine-HYDROcodone (TUSSIONEX) 10-8 MG/5ML SUER Take  5 mLs by mouth every 12 (twelve) hours as needed for cough. 08/01/16   Dorothea OgleMyers, Iskra M, MD  cholecalciferol (VITAMIN D) 1000 units tablet Take 1,000 Units by mouth daily.    [provider]  clindamycin (CLEOCIN) 300 MG capsule Take 1 capsule (300 mg total) by mouth 3 (three) times daily. 08/01/16   Dorothea OgleMyers, Iskra M, MD  cyclobenzaprine (FLEXERIL) 10 MG tablet Take 1 tablet (10 mg total) by mouth 2 (two) times daily as needed for muscle spasms. 05/30/17   Kellie ShropshireShrosbree,  Jacayla Nordell J, PA-C  famotidine (PEPCID) 40 MG tablet Take 1 tablet (40 mg total) by mouth at bedtime. Patient taking differently: Take 40 mg by mouth at bedtime as needed for heartburn or indigestion.  08/03/15   Sam, Ace GinsSerena Y, PA-C  lisinopril-hydrochlorothiazide (PRINZIDE,ZESTORETIC) 20-12.5 MG per tablet Take 1 tablet by mouth every morning.     [provider]  lisinopril-hydrochlorothiazide (ZESTORETIC) 20-12.5 MG tablet Take 1 tablet by mouth daily. 05/30/17   Kellie ShropshireShrosbree, Chanice Brenton J, PA-C  meloxicam (MOBIC) 15 MG tablet Take 15 mg by mouth daily.    [provider]  Multiple Vitamins-Minerals (MULTIVITAMIN ADULT PO) Take 1 tablet by mouth daily.    [provider]  omeprazole (PRILOSEC) 40 MG capsule Take 1 capsule (40 mg total) by mouth daily. Patient taking differently: Take 40 mg by mouth daily as needed (heartburn/ acid reflux).  08/03/15   Sam, Ace GinsSerena Y, PA-C  traMADol (ULTRAM) 50 MG tablet Take 1 tablet (50 mg total) by mouth every 6 (six) hours as needed. 08/01/16   Dorothea OgleMyers, Iskra M, MD    Family History Family History  Problem Relation Age of Onset  . Hypertension Father     Social History Social History  Substance Use Topics  . Smoking status: Current Every Day Smoker    Packs/day: 0.50    Years: 32.00    Types: Cigarettes  . Smokeless tobacco: Never Used  . Alcohol use 8.4 oz/week    14 Cans of beer per week     Comment: 4 40 ounce beers and liquor     Allergies   Diphenhydramine hcl and Morphine and related   Review of Systems Review of Systems  Constitutional: Negative for chills, fatigue, fever and unexpected weight change.  Eyes: Negative for visual disturbance.  Respiratory: Negative for shortness of breath.   Cardiovascular: Negative for chest pain.  Gastrointestinal: Negative for abdominal pain, diarrhea, nausea and vomiting.  Genitourinary: Negative for difficulty urinating, dysuria, flank pain and hematuria.  Musculoskeletal:  Positive for back pain. Negative for gait problem, joint swelling, neck pain and neck stiffness.  Skin: Negative for wound.  Neurological: Negative for weakness and numbness.     Physical Exam Updated Vital Signs BP (!) 147/88   Pulse 96   Temp 98.1 F (36.7 C) (Oral)   Resp 16   SpO2 100%   Physical Exam  Constitutional: She is oriented to person, place, and time. She appears well-developed and well-nourished. No distress.  HENT:  Head: Normocephalic and atraumatic.  Eyes: Pupils are equal, round, and reactive to light. Conjunctivae are normal. Right eye exhibits no discharge. Left eye exhibits no discharge.  Neck: Normal range of motion. Neck supple.  No C-spine tenderness.  Cardiovascular: Normal rate, regular rhythm and intact distal pulses.  Exam reveals no friction rub.   No murmur heard. Pulmonary/Chest: Effort normal and breath sounds normal. No respiratory distress. She has no wheezes. She has no rales.  Abdominal: Soft. Bowel sounds  are normal. There is no tenderness. There is no guarding.  No pulsatile mass. No CVA tenderness.  Musculoskeletal:  Mild tenderness to palpation over L-spine and bilateral lumbar paraspinal muscles. No deformity or step-off T-spine or L-spine appreciated. Full ROM of bilateral knees, ankles. 5/5 strength in bilateral hips, knees, ankles. DP and PT pulses 2+ bilaterally.  Neurological: She is alert and oriented to person, place, and time. Coordination normal.  Distal sensation intact to light/sharp touch in bilateral lower extremities. Patellar reflexes 2+ bilaterally. Gait normal.  Skin: Skin is warm and dry. Capillary refill takes less than 2 seconds. No rash noted. She is not diaphoretic.  Psychiatric: She has a normal mood and affect. Her behavior is normal.  Nursing note and vitals reviewed.    ED Treatments / Results  Labs (all labs ordered are listed, but only abnormal results are displayed) Labs Reviewed - No data to  display  EKG  EKG Interpretation None       Radiology No results found.  Procedures Procedures (including critical care time)  Medications Ordered in ED Medications  ibuprofen (ADVIL,MOTRIN) tablet 800 mg (not administered)  cyclobenzaprine (FLEXERIL) tablet 10 mg (not administered)     Initial Impression / Assessment and Plan / ED Course  I have reviewed the triage vital signs and the nursing notes.  Pertinent labs & imaging results that were available during my care of the patient were reviewed by me and considered in my medical decision making (see chart for details).    Patient with back pain. No neurological deficits and normal neuro exam.  Patient can walk but states is painful. No loss of bowel or bladder control. No concern for cauda equina.  No fever, night sweats, weight loss, h/o cancer, IVDU. RICE protocol and pain medicine indicated and discussed with patient. Have discussed with patient that ibuprofen can increase her risk of stomach ulcer given her history. Have suggested Tylenol, counseled her on muscle relaxant use. We will refill her blood pressure medication, as she is out. Have provided her with information to establish care and follow up with [facility] for blood pressure recheck and chronic lower back pain. Patient agrees and voices understanding at the bedside.  Final Clinical Impressions(s) / ED Diagnoses   Final diagnoses:  Chronic bilateral low back pain without sciatica  Essential hypertension    New Prescriptions New Prescriptions   CYCLOBENZAPRINE (FLEXERIL) 10 MG TABLET    Take 1 tablet (10 mg total) by mouth 2 (two) times daily as needed for muscle spasms.   LISINOPRIL-HYDROCHLOROTHIAZIDE (ZESTORETIC) 20-12.5 MG TABLET    Take 1 tablet by mouth daily.     Kellie Shropshire, PA-C 05/30/17 1513    Little, Ambrose Finland, MD 05/31/17 [phone]

## 2017-07-06 ENCOUNTER — Emergency Department (HOSPITAL_COMMUNITY)
Admission: EM | Admit: 2017-07-06 | Discharge: 2017-07-06 | Disposition: A | Discharge status: Home / Self Care | Payer: Medicaid Other | Attending: Emergency Medicine | Admitting: Emergency Medicine

## 2017-07-06 DIAGNOSIS — L03211 Cellulitis of face: Secondary | ICD-10-CM | POA: Insufficient documentation

## 2017-07-06 DIAGNOSIS — J45909 Unspecified asthma, uncomplicated: Secondary | ICD-10-CM | POA: Insufficient documentation

## 2017-07-06 DIAGNOSIS — I10 Essential (primary) hypertension: Secondary | ICD-10-CM | POA: Insufficient documentation

## 2017-07-06 DIAGNOSIS — Z79899 Other long term (current) drug therapy: Secondary | ICD-10-CM | POA: Insufficient documentation

## 2017-07-06 DIAGNOSIS — F17210 Nicotine dependence, cigarettes, uncomplicated: Secondary | ICD-10-CM | POA: Insufficient documentation

## 2017-07-06 MED ORDER — ACETAMINOPHEN 500 MG PO TABS
1000.0000 mg | ORAL_TABLET | Freq: Once | ORAL | Status: AC
  Administered 2017-07-06: 1000 mg via ORAL
  Filled 2017-07-06: qty 2

## 2017-07-06 MED ORDER — CEPHALEXIN 500 MG PO CAPS: 500.0000 mg | ORAL_CAPSULE | Freq: Four times a day (QID) | ORAL | 0 refills | Status: AC

## 2017-07-06 MED ORDER — IBUPROFEN 800 MG PO TABS: 800.0000 mg | ORAL_TABLET | Freq: Three times a day (TID) | ORAL | 0 refills | Status: DC

## 2017-07-06 MED ORDER — OXYCODONE HCL 5 MG PO TABS: 5.0000 mg | ORAL_TABLET | Freq: Four times a day (QID) | ORAL | 0 refills | Status: AC | PRN

## 2017-07-06 MED ORDER — HYDROMORPHONE HCL 1 MG/ML IJ SOLN
0.5000 mg | Freq: Once | INTRAMUSCULAR | Status: AC
  Administered 2017-07-06: 0.5 mg via INTRAVENOUS
  Filled 2017-07-06: qty 1

## 2017-07-06 MED ORDER — CEPHALEXIN 500 MG PO CAPS
500.0000 mg | ORAL_CAPSULE | Freq: Once | ORAL | Status: AC
  Administered 2017-07-06: 500 mg via ORAL
  Filled 2017-07-06: qty 1

## 2017-07-06 MED ORDER — ACETAMINOPHEN 500 MG PO TABS: 1000.0000 mg | ORAL_TABLET | Freq: Four times a day (QID) | ORAL | 0 refills | Status: DC | PRN

## 2017-07-06 NOTE — ED Triage Notes (Signed)
Pt presents w/ severe right sided mouth swelling and tenderness. Pt denies hx of dental abscess. Pt reports tooth pain x2 on the right side. Teeth appear intact. Pt A+OX4, speaking in complete sentences, ambulatory to triage.

## 2017-07-06 NOTE — Discharge Instructions (Signed)
You have cellulitis which is an infection of the superficial skin. Please take antibiotics as prescribed. For pain take 1000 mg of Tylenol +600 mg of ibuprofen every 6-8 hours. If pain is severe you can add oxycodone as prescribed.

## 2017-07-06 NOTE — ED Provider Notes (Signed)
[facility]-EMERGENCY DEPT Provider Note   CSN: [id number] Arrival date & time: 07/06/17  0747     History   Chief Complaint Chief Complaint  Patient presents with  . Abscess    Mouth    HPI [name] [name] is a 51 y.o. female w/ no significant past medical history presents to the ED for focal swelling to right lower jaw associated with pain and warmth that she noticed this morning. Reports ongoing right lower posterior dental pain x 2 weeks. She has been taking tylenol and tylenol #3s for this pain with some relief. States she cannot afford dental follow up. No fevers, chills, drooling, hot potato voice, sore throat, neck pain or stiffness, anterior neck swelling. Tolerating orals, she is hungry right now and wants to eat. No h/o immunosuppresion. No trauma.   HPI  Past Medical History:  Diagnosis Date  . Asthma   . Bipolar 1 disorder (HCC)   . Chronic lumbar pain   . DDD (degenerative disc disease)   . Depression   . GERD (gastroesophageal reflux disease)   . History of gastric ulcer   . Hyperlipidemia   . Hypertension   . Migraine   . Osteoarthritis   . Right ACL tear   . Right knee meniscal tear   . Sciatica   . Wears glasses     Patient Active Problem List   Diagnosis Date Noted  . Airway problem   . 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 Hypokalemia 07/30/2016  . Dehydration 07/30/2016  . Alcohol abuse 07/30/2016  . S/P ACL reconstruction 01/31/2014  . DEPRESSION 11/17/2008  . ACUTE SINUSITIS, UNSPECIFIED 11/17/2008  . ALLERGIC RHINITIS 11/17/2008  . GERD 11/17/2008  . HEADACHE 11/17/2008    Past Surgical History:  Procedure Laterality Date  . ABDOMINAL HYSTERECTOMY    . BUNIONECTOMY Left 2011  . HEEL SPUR SURGERY Right 2012  . KNEE ARTHROSCOPY WITH ANTERIOR CRUCIATE LIGAMENT (ACL) REPAIR WITH HAMSTRING GRAFT Right 01/31/2014   Procedure: RIGHT KNEE ARTHROSCOPY WITH ALLOGRAFT (ACL) ANTERIOR CRUCIATE LIGAMENT RECONSTRUCTON  PARTIAL MENISCECTOMY VERSES REPAIR ;  Surgeon: Eugenia Mcalpineobert Collins, MD;  [facility];  Service: Orthopedics;  Laterality: Right;  . TOTAL ABDOMINAL HYSTERECTOMY W/ BILATERAL SALPINGOOPHORECTOMY  04-20-2006    OB History    No data available       Home Medications    Prior to Admission medications   Medication Sig Start Date End Date Taking? Authorizing Provider  lisinopril-hydrochlorothiazide (ZESTORETIC) 20-12.5 MG tablet Take 1 tablet by mouth daily. 05/30/17  Yes Kellie ShropshireShrosbree, Emily J, PA-C  acetaminophen (TYLENOL) 500 MG tablet Take 2 tablets (1,000 mg total) by mouth every 6 (six) hours as needed. 07/06/17   Liberty HandyGibbons, Jacquelyn Shadrick J, PA-C  cephALEXin (KEFLEX) 500 MG capsule Take 1 capsule (500 mg total) by mouth 4 (four) times daily for 7 days. 07/06/17 07/13/17  Liberty HandyGibbons, Ji Fairburn J, PA-C  chlorpheniramine-HYDROcodone (TUSSIONEX) 10-8 MG/5ML SUER Take 5 mLs by mouth every 12 (twelve) hours as needed for cough. Patient not taking: Reported on 07/06/2017 08/01/16   Dorothea OgleMyers, Iskra M, MD  cholecalciferol (VITAMIN D) 1000 units tablet Take 1,000 Units by mouth daily.    [provider]  clindamycin (CLEOCIN) 300 MG capsule Take 1 capsule (300 mg total) by mouth 3 (three) times daily. Patient not taking: Reported on 07/06/2017 08/01/16   Dorothea OgleMyers, Iskra M, MD  cyclobenzaprine (FLEXERIL) 10 MG tablet Take 1 tablet (10 mg total) by mouth 2 (two) times daily as needed for muscle  spasms. Patient not taking: Reported on 07/06/2017 05/30/17   Kellie ShropshireShrosbree, Emily J, PA-C  famotidine (PEPCID) 40 MG tablet Take 1 tablet (40 mg total) by mouth at bedtime. Patient taking differently: Take 40 mg by mouth at bedtime as needed for heartburn or indigestion.  08/03/15   Sam, Ace GinsSerena Y, PA-C  ibuprofen (ADVIL,MOTRIN) 800 MG tablet Take 1 tablet (800 mg total) by mouth 3 (three) times daily. 07/06/17   Liberty HandyGibbons, Areej Tayler J, PA-C  meloxicam (MOBIC) 15 MG tablet Take 15 mg by mouth daily.    [provider]  omeprazole (PRILOSEC) 40 MG capsule Take 1 capsule (40 mg total) by mouth daily. Patient not taking: Reported on 07/06/2017 08/03/15   Carlene CoriaSam, Serena Y, PA-C  traMADol (ULTRAM) 50 MG tablet Take 1 tablet (50 mg total) by mouth every 6 (six) hours as needed. Patient not taking: Reported on 07/06/2017 08/01/16   Dorothea OgleMyers, Iskra M, MD    Family History Family History  Problem Relation Age of Onset  . Hypertension Father     Social History Social History   Tobacco Use  . Smoking status: Current Every Day Smoker    Packs/day: 0.50    Years: 32.00    Pack years: 16.00    Types: Cigarettes  . Smokeless tobacco: Never Used  Substance Use Topics  . Alcohol use: Yes    Alcohol/week: 8.4 oz    Types: 14 Cans of beer per week    Comment: 4 40 ounce beers and liquor  . Drug use: Yes    Types: Marijuana    Comment: occasional  marijuana--  hx  "crack" use  last used 2004--  no treatment rehab (done on her own)     Allergies   Diphenhydramine hcl   Review of Systems Review of Systems  HENT: Positive for dental problem and facial swelling.   All other systems reviewed and are negative.    Physical Exam Updated Vital Signs BP (!) 160/102 (BP Location: Left Arm)   Pulse 75   Temp 98.3 F (36.8 C) (Oral)   Resp 16   Ht 5\' 4"  (1.626 m)   Wt 67.8 kg (149 lb 7.6 oz)   SpO2 100%   BMI 25.66 kg/m   Physical Exam  Constitutional: She is oriented to person, place, and time. She appears well-developed and well-nourished. No distress.  NAD.  HENT:  Head: Normocephalic and atraumatic.  Right Ear: External ear normal.  Left Ear: External ear normal.  Nose: Nose normal.  Poor dentition; mildly tender posterior molar w/o surrounding edema, fluctuance or abscess.  Focal area of edema, tenderness and questionable warmth to right lower jaw w/o induration or obvious fluctuance.  No trismus, drooling, sublingual edema or tenderness. Soft palate is flat. No stridor.   Eyes:  Conjunctivae and EOM are normal. No scleral icterus.  Neck: Normal range of motion. Neck supple.  No cervical adenopathy. No anterior neck swelling. PROM of neck w/o pain.   Cardiovascular: Normal rate, regular rhythm and normal heart sounds.  No murmur heard. Pulmonary/Chest: Effort normal and breath sounds normal. She has no wheezes.  Musculoskeletal: Normal range of motion. She exhibits no deformity.  Neurological: She is alert and oriented to person, place, and time.  Skin: Skin is warm and dry. Capillary refill takes less than 2 seconds.  Psychiatric: She has a normal mood and affect. Her behavior is normal. Judgment and thought content normal.  Nursing note and vitals reviewed.    ED Treatments / Results  Labs (all labs ordered are listed, but only abnormal results are displayed) Labs Reviewed - No data to display  EKG  EKG Interpretation None       Radiology No results found.  Procedures Procedures (including critical care time)  Medications Ordered in ED Medications  cephALEXin (KEFLEX) capsule 500 mg (not administered)  HYDROmorphone (DILAUDID) injection 0.5 mg (0.5 mg Intravenous Given 07/06/17 0854)  acetaminophen (TYLENOL) tablet 1,000 mg (1,000 mg Oral Given 07/06/17 0854)     Initial Impression / Assessment and Plan / ED Course  I have reviewed the triage vital signs and the nursing notes.  Pertinent labs & imaging results that were available during my care of the patient were reviewed by me and considered in my medical decision making (see chart for details).    51 year old female with no significant past medical history presents with sudden onset focal area of cellulitis to right lower all. Has poor dentition throughout with pain in the posterior molar on this side. On exam, she is afebrile and nontoxic. No trismus, drooling, hot potato voice, anterior neck swelling. Neck is supple without meningeal signs. No hot potato voice. No pain with passive range  of motion of the neck. She is tolerating oral fluids and food. Bedside ultrasound shows cobblestoning consistent with cellulitis but no abscess. I think patient stable for discharge with oral antibiotics, NSAIDs and analgesia. Discussed return precautions.  Final Clinical Impressions(s) / ED Diagnoses   Final diagnoses:  Facial cellulitis    ED Discharge Orders        Ordered    cephALEXin (KEFLEX) 500 MG capsule  4 times daily     07/06/17 0920    ibuprofen (ADVIL,MOTRIN) 800 MG tablet  3 times daily     07/06/17 0920    acetaminophen (TYLENOL) 500 MG tablet  Every 6 hours PRN     07/06/17 0920       Liberty Handy, PA-C 07/06/17 1610    Mancel Bale, MD 07/07/17 1726

## 2017-07-12 ENCOUNTER — Other Ambulatory Visit: Payer: Self-pay

## 2017-07-12 ENCOUNTER — Emergency Department (HOSPITAL_COMMUNITY): Payer: Self-pay

## 2017-07-12 ENCOUNTER — Encounter (HOSPITAL_COMMUNITY): Payer: Self-pay | Admitting: Nurse Practitioner

## 2017-07-12 ENCOUNTER — Emergency Department (HOSPITAL_COMMUNITY)
Admission: EM | Admit: 2017-07-12 | Discharge: 2017-07-12 | Disposition: A | Discharge status: Home / Self Care | Payer: Self-pay | Attending: Emergency Medicine | Admitting: Emergency Medicine

## 2017-07-12 DIAGNOSIS — Z79899 Other long term (current) drug therapy: Secondary | ICD-10-CM | POA: Insufficient documentation

## 2017-07-12 DIAGNOSIS — I10 Essential (primary) hypertension: Secondary | ICD-10-CM | POA: Insufficient documentation

## 2017-07-12 DIAGNOSIS — L03211 Cellulitis of face: Secondary | ICD-10-CM | POA: Insufficient documentation

## 2017-07-12 DIAGNOSIS — F17210 Nicotine dependence, cigarettes, uncomplicated: Secondary | ICD-10-CM | POA: Insufficient documentation

## 2017-07-12 LAB — CBC WITH DIFFERENTIAL/PLATELET
Basophils Absolute: 0 10*3/uL (ref 0.0–0.1)
Basophils Relative: 1 %
Eosinophils Absolute: 0.1 10*3/uL (ref 0.0–0.7)
Eosinophils Relative: 2 %
HCT: 30.6 % — ABNORMAL LOW (ref 36.0–46.0)
Hemoglobin: 10.7 g/dL — ABNORMAL LOW (ref 12.0–15.0)
Lymphocytes Relative: 54 %
Lymphs Abs: 3.5 10*3/uL (ref 0.7–4.0)
MCH: 32.4 pg (ref 26.0–34.0)
MCHC: 35 g/dL (ref 30.0–36.0)
MCV: 92.7 fL (ref 78.0–100.0)
Monocytes Absolute: 0.5 10*3/uL (ref 0.1–1.0)
Monocytes Relative: 8 %
Neutro Abs: 2.2 10*3/uL (ref 1.7–7.7)
Neutrophils Relative %: 35 %
Platelets: 430 10*3/uL — ABNORMAL HIGH (ref 150–400)
RBC: 3.3 MIL/uL — ABNORMAL LOW (ref 3.87–5.11)
RDW: 12.8 % (ref 11.5–15.5)
WBC: 6.3 10*3/uL (ref 4.0–10.5)

## 2017-07-12 LAB — BASIC METABOLIC PANEL
Anion gap: 8 (ref 5–15)
BUN: 7 mg/dL (ref 6–20)
CO2: 27 mmol/L (ref 22–32)
Calcium: 8.9 mg/dL (ref 8.9–10.3)
Chloride: 104 mmol/L (ref 101–111)
Creatinine, Ser: 0.72 mg/dL (ref 0.44–1.00)
GFR calc Af Amer: >60 mL/min (ref >60)
GFR calc non Af Amer: >60 mL/min (ref >60)
Glucose, Bld: 88 mg/dL (ref 65–99)
Potassium: 3 mmol/L — ABNORMAL LOW (ref 3.5–5.1)
Sodium: 139 mmol/L (ref 135–145)

## 2017-07-12 LAB — I-STAT CG4 LACTIC ACID, ED: Lactic Acid, Venous: 0.51 mmol/L (ref 0.5–1.9)

## 2017-07-12 MED ORDER — ACETAMINOPHEN 500 MG PO TABS: 500.0000 mg | ORAL_TABLET | Freq: Four times a day (QID) | ORAL | 0 refills | Status: DC | PRN

## 2017-07-12 MED ORDER — IOPAMIDOL (ISOVUE-300) INJECTION 61%
INTRAVENOUS | Status: AC
  Administered 2017-07-12: 75 mL via INTRAVENOUS
  Filled 2017-07-12: qty 75

## 2017-07-12 MED ORDER — SULFAMETHOXAZOLE-TRIMETHOPRIM 800-160 MG PO TABS
1.0000 | ORAL_TABLET | Freq: Once | ORAL | Status: AC
  Administered 2017-07-12: 1 via ORAL
  Filled 2017-07-12: qty 1

## 2017-07-12 MED ORDER — MORPHINE SULFATE (PF) 2 MG/ML IV SOLN
2.0000 mg | Freq: Once | INTRAVENOUS | Status: AC
  Administered 2017-07-12: 2 mg via INTRAVENOUS
  Filled 2017-07-12: qty 1

## 2017-07-12 MED ORDER — LISINOPRIL-HYDROCHLOROTHIAZIDE 20-12.5 MG PO TABS: 1.0000 | ORAL_TABLET | Freq: Every day | ORAL | 0 refills | Status: DC

## 2017-07-12 MED ORDER — IBUPROFEN 800 MG PO TABS: 800.0000 mg | ORAL_TABLET | Freq: Three times a day (TID) | ORAL | 0 refills | Status: DC

## 2017-07-12 MED ORDER — SULFAMETHOXAZOLE-TRIMETHOPRIM 800-160 MG PO TABS: 1.0000 | ORAL_TABLET | Freq: Two times a day (BID) | ORAL | 0 refills | Status: AC

## 2017-07-12 NOTE — ED Notes (Signed)
Patient transported to CT 

## 2017-07-12 NOTE — ED Provider Notes (Signed)
[facility]-EMERGENCY DEPT Provider Note   CSN: [id number] Arrival date & time: 07/12/17  1209     History   Chief Complaint Chief Complaint  Patient presents with  . Abscess    Right side of chin     HPI [name] [name] is a 51 y.o. female with history of bipolar 1 disorder, HLD, HTN and retropharyngeal abscess presents today with chief complaint acute onset, somewhat improving area of swelling and erythema in the submandibular region for 1 week.  She states that when she awoke on 07/06/2017 6 days ago she had developed an area of swelling and redness to the right side of her jaw.  [facility] and was discharged with antibiotics, NSAIDs, and Tylenol.  She was thought to have a facial cellulitis at that time.  She states that since then the swelling has improved, but the area is now draining purulent material constantly for the past 4 days.  She endorses dull throbbing pain which radiates down the neck.  She also notes aching frontal headaches which do not radiate.  Not associated with photophobia or vision changes.  She denies dental pain, drooling, throat tightness, voice changes, or sore throat.  She does endorse pain with extension of the neck.  Denies fevers or chills.  She has completed the antibiotics entirely.  NSAIDs and Tylenol somewhat helpful. No shortness of breath.   The history is provided by the patient.    Past Medical History:  Diagnosis Date  . Asthma   . Bipolar 1 disorder (HCC)   . Chronic lumbar pain   . DDD (degenerative disc disease)   . Depression   . GERD (gastroesophageal reflux disease)   . History of gastric ulcer   . Hyperlipidemia   . Hypertension   . Migraine   . Osteoarthritis   . Right ACL tear   . Right knee meniscal tear   . Sciatica   . Wears glasses     Patient Active Problem List   Diagnosis Date Noted  . Airway problem   . 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 Hypokalemia  07/30/2016  . Dehydration 07/30/2016  . Alcohol abuse 07/30/2016  . S/P ACL reconstruction 01/31/2014  . DEPRESSION 11/17/2008  . ACUTE SINUSITIS, UNSPECIFIED 11/17/2008  . ALLERGIC RHINITIS 11/17/2008  . GERD 11/17/2008  . HEADACHE 11/17/2008    Past Surgical History:  Procedure Laterality Date  . ABDOMINAL HYSTERECTOMY    . BUNIONECTOMY Left 2011  . HEEL SPUR SURGERY Right 2012  . KNEE ARTHROSCOPY WITH ANTERIOR CRUCIATE LIGAMENT (ACL) REPAIR WITH HAMSTRING GRAFT Right 01/31/2014   Procedure: RIGHT KNEE ARTHROSCOPY WITH ALLOGRAFT (ACL) ANTERIOR CRUCIATE LIGAMENT RECONSTRUCTON PARTIAL MENISCECTOMY VERSES REPAIR ;  Surgeon: Eugenia Mcalpineobert Collins, MD;  [facility];  Service: Orthopedics;  Laterality: Right;  . TOTAL ABDOMINAL HYSTERECTOMY W/ BILATERAL SALPINGOOPHORECTOMY  04-20-2006    OB History    No data available       Home Medications    Prior to Admission medications   Medication Sig Start Date End Date Taking? Authorizing Provider  Aspirin-Acetaminophen-Caffeine (GOODY HEADACHE PO) Take 2 packets by mouth daily as needed (pain).   Yes [provider]  calcium carbonate (TUMS EX) 750 MG chewable tablet Chew 1 tablet by mouth 2 (two) times daily as needed for heartburn.   Yes [provider]  acetaminophen (TYLENOL) 500 MG tablet Take 1 tablet (500 mg total) by mouth every 6 (six) hours as  needed. 07/12/17   Luevenia MaxinFawze, Messina Kosinski A, PA-C  cephALEXin (KEFLEX) 500 MG capsule Take 1 capsule (500 mg total) by mouth 4 (four) times daily for 7 days. Patient not taking: Reported on 07/12/2017 07/06/17 07/13/17  Liberty HandyGibbons, Claudia J, PA-C  chlorpheniramine-HYDROcodone (TUSSIONEX) 10-8 MG/5ML SUER Take 5 mLs by mouth every 12 (twelve) hours as needed for cough. Patient not taking: Reported on 07/06/2017 08/01/16   Dorothea OgleMyers, Iskra M, MD  cholecalciferol (VITAMIN D) 1000 units tablet Take 1,000 Units by mouth daily.    [provider]  clindamycin (CLEOCIN)  300 MG capsule Take 1 capsule (300 mg total) by mouth 3 (three) times daily. Patient not taking: Reported on 07/06/2017 08/01/16   Dorothea OgleMyers, Iskra M, MD  cyclobenzaprine (FLEXERIL) 10 MG tablet Take 1 tablet (10 mg total) by mouth 2 (two) times daily as needed for muscle spasms. Patient not taking: Reported on 07/06/2017 05/30/17   Kellie ShropshireShrosbree, Emily J, PA-C  famotidine (PEPCID) 40 MG tablet Take 1 tablet (40 mg total) by mouth at bedtime. Patient taking differently: Take 40 mg by mouth at bedtime as needed for heartburn or indigestion.  08/03/15   Sam, Ace GinsSerena Y, PA-C  ibuprofen (ADVIL,MOTRIN) 800 MG tablet Take 1 tablet (800 mg total) by mouth 3 (three) times daily. 07/12/17   Treana Lacour A, PA-C  lisinopril-hydrochlorothiazide (PRINZIDE,ZESTORETIC) 20-12.5 MG tablet Take 1 tablet by mouth daily. 07/12/17 08/11/17  Michela PitcherFawze, Shakura Cowing A, PA-C  meloxicam (MOBIC) 15 MG tablet Take 15 mg by mouth daily.    [provider]  omeprazole (PRILOSEC) 40 MG capsule Take 1 capsule (40 mg total) by mouth daily. Patient not taking: Reported on 07/06/2017 08/03/15   Carlene CoriaSam, Serena Y, PA-C  sulfamethoxazole-trimethoprim (BACTRIM DS,SEPTRA DS) 800-160 MG tablet Take 1 tablet by mouth 2 (two) times daily for 10 days. 07/12/17 07/22/17  Michela PitcherFawze, Lecretia Buczek A, PA-C  traMADol (ULTRAM) 50 MG tablet Take 1 tablet (50 mg total) by mouth every 6 (six) hours as needed. Patient not taking: Reported on 07/06/2017 08/01/16   Dorothea OgleMyers, Iskra M, MD    Family History Family History  Problem Relation Age of Onset  . Hypertension Father     Social History Social History   Tobacco Use  . Smoking status: Current Every Day Smoker    Packs/day: 0.50    Years: 32.00    Pack years: 16.00    Types: Cigarettes  . Smokeless tobacco: Never Used  Substance Use Topics  . Alcohol use: Yes    Alcohol/week: 8.4 oz    Types: 14 Cans of beer per week    Comment: 4 40 ounce beers and liquor  . Drug use: Yes    Types: Marijuana    Comment: occasional   marijuana--  hx  "crack" use  last used 2004--  no treatment rehab (done on her own)     Allergies   Diphenhydramine hcl   Review of Systems Review of Systems  Constitutional: Negative for chills and fever.  HENT: Positive for facial swelling. Negative for dental problem, drooling, rhinorrhea, sore throat and trouble swallowing.   Eyes: Negative for photophobia and visual disturbance.  Respiratory: Negative for shortness of breath.   Cardiovascular: Negative for chest pain.  Gastrointestinal: Negative for abdominal pain, nausea and vomiting.  Neurological: Positive for headaches. Negative for syncope.  All other systems reviewed and are negative.    Physical Exam Updated Vital Signs BP (!) 175/98 (BP Location: Left Arm)   Pulse 71   Temp 98.4 F (36.9 C) (  Oral)   Resp 18   Ht 5\' 4"  (1.626 m)   Wt 68 kg (150 lb)   SpO2 99%   BMI 25.75 kg/m   Physical Exam  Constitutional: She appears well-developed and well-nourished. No distress.  HENT:  Head: Atraumatic.  Nose: Nose normal.  Mouth/Throat: Oropharynx is clear and moist.  TMs without erythema or bulging bilaterally.  No frontal or maxillary sinus tenderness.  Dentition is poor with no evidence of gingival irritation or tenderness to percussion of the teeth.  No trismus or sublingual abnormalities.  Posterior oropharynx without erythema, tonsillar hypertrophy, exudates, or uvular deviation.  Significant right-sided submandibular swelling and induration.  There is central erythema and a 1 cm area of drainage which is actively draining purulent material.  Exquisitely tender to palpation.  Eyes: Conjunctivae and EOM are normal. Pupils are equal, round, and reactive to light. Right eye exhibits no discharge. Left eye exhibits no discharge.  Neck: No JVD present. No tracheal deviation present.  Some tenderness to palpation of the anterior neck.  Pain elicited with extension of the cervical spine and lateral rotation    Cardiovascular: Normal rate, regular rhythm and normal heart sounds.  Pulmonary/Chest: Effort normal and breath sounds normal.  Abdominal: Soft. Bowel sounds are normal. She exhibits no distension. There is no tenderness.  Musculoskeletal: Normal range of motion. She exhibits no edema.  Neurological: She is alert.  Skin: Skin is warm and dry. No erythema.  Psychiatric: She has a normal mood and affect. Her behavior is normal.  Nursing note and vitals reviewed.    ED Treatments / Results  Labs (all labs ordered are listed, but only abnormal results are displayed) Labs Reviewed  BASIC METABOLIC PANEL - Abnormal; Notable for the following components:      Result Value   Potassium 3.0 (*)    All other components within normal limits  CBC WITH DIFFERENTIAL/PLATELET - Abnormal; Notable for the following components:   RBC 3.30 (*)    Hemoglobin 10.7 (*)    HCT 30.6 (*)    Platelets 430 (*)    All other components within normal limits  I-STAT CG4 LACTIC ACID, ED    EKG  EKG Interpretation None       Radiology Ct Soft Tissue Neck W Contrast  Result Date: 07/12/2017 CLINICAL DATA:  Severe chin swelling injury and draining abscess ; persistent symptoms after 2 courses of antibiotics. EXAM: CT NECK WITH CONTRAST TECHNIQUE: Multidetector CT imaging of the neck was performed using the standard protocol following the bolus administration of intravenous contrast. CONTRAST:  75mL ISOVUE-300 IOPAMIDOL (ISOVUE-300) INJECTION 61% COMPARISON:  None. FINDINGS: PHARYNX AND LARYNX: Fullness of the pharyngeal lymphoid tissue without acute process. Normal larynx. Widely patent airway. SALIVARY GLANDS: Normal. THYROID: Normal. LYMPH NODES: Prominent reniform likely reactive cervical lymph nodes measuring to 13 mm RIGHT level 1 B VASCULAR: Normal. LIMITED INTRACRANIAL: Normal. VISUALIZED ORBITS: Normal. MASTOIDS AND VISUALIZED PARANASAL SINUSES: Well-aerated. SKELETON: Nonacute. Numerous molar dental  caries and approximate tooth 31 periapical abscess. Degenerative cervical spine resulting in moderate to severe bilateral C5-6 neural foraminal narrowing. UPPER CHEST: Lung apices are clear. No superior mediastinal lymphadenopathy. OTHER: RIGHT lower face soft tissue swelling, subcutaneous fat stranding in focal skin defect without drainable fluid collection. Trace RIGHT lower face and upper neck effusion with thickened RIGHT platysma. IMPRESSION: 1. RIGHT lower facial cellulitis, no drainable fluid collection. 2. Scattered dental caries. Approximate tooth 30 periapical abscess though, cellulitis could be odontogenic in origin . 3. Mild lymphoid  hyperplasia associated with recent viral illnesses and immunocompromised states. 4. Moderate to severe C5-6 neural foraminal narrowing. Electronically Signed   By: Awilda Metro M.D.   On: 07/12/2017 18:37    Procedures Procedures (including critical care time)  Medications Ordered in ED Medications  iopamidol (ISOVUE-300) 61 % injection (75 mLs Intravenous Contrast Given 07/12/17 1803)  morphine 2 MG/ML injection 2 mg (2 mg Intravenous Given 07/12/17 1931)  sulfamethoxazole-trimethoprim (BACTRIM DS,SEPTRA DS) 800-160 MG per tablet 1 tablet (1 tablet Oral Given 07/12/17 1931)     Initial Impression / Assessment and Plan / ED Course  I have reviewed the triage vital signs and the nursing notes.  Pertinent labs & imaging results that were available during my care of the patient were reviewed by me and considered in my medical decision making (see chart for details).     Patient presents with right sided submandibular swelling and erythema.  Afebrile, hypertensive while in the ED but she has not been taking her blood pressure medications for several months.  No leukocytosis, lactate is normal, remainder of lab work is unremarkable.  She is nontoxic in appearance.  Diffusely decaying dentition but no dental pain on examination today.  No evidence of strep  pharyngitis or PTA.  No fever or meningeal signs to suggest meningitis. With pain on movement of the neck as well as history of retropharyngeal abscess, will obtain CT of the neck for further evaluation.   CT shows right lower facial cellulitis, no drainable fluid collection.  Also shows scattered dental caries. Approximate tooth 30 periapical abscess though.  No evidence of Ludwig's angina.  Spoke with patient regarding the risks and benefits of I&D, which patient refuses at this time.  On reevaluation, patient's pain has been managed and she is tolerating p.o. food and fluids without difficulty.  I think this is reasonable given that CT did not show a drainable fluid collection.  Will discharge with Bactrim as well and refill her blood pressure medications.  She was given referral for dental follow-up as well as outpatient PCP follow-up.  Discussed indications for return to the ED. Pt verbalized understanding of and agreement with plan and is safe for discharge home at this time.  She has no complaints prior to discharge.  Final Clinical Impressions(s) / ED Diagnoses   Final diagnoses:  Facial cellulitis    ED Discharge Orders        Ordered    sulfamethoxazole-trimethoprim (BACTRIM DS,SEPTRA DS) 800-160 MG tablet  2 times daily     07/12/17 1925    lisinopril-hydrochlorothiazide (PRINZIDE,ZESTORETIC) 20-12.5 MG tablet  Daily     07/12/17 1925    ibuprofen (ADVIL,MOTRIN) 800 MG tablet  3 times daily     07/12/17 1925    acetaminophen (TYLENOL) 500 MG tablet  Every 6 hours PRN     07/12/17 1925       Jeanie Sewer, PA-C 07/13/17 0108    Jeanie Sewer, PA-C 07/13/17 0109    Alvira Monday, MD 07/13/17 1259

## 2017-07-12 NOTE — ED Notes (Signed)
EDPA Provider at bedside. 

## 2017-07-12 NOTE — ED Triage Notes (Signed)
Pt presents with severe swelling, redness, and draining abcess to chin. Patient states she has been on 2 rounds of abx without relief and now it is continuously draining. Pt denies pain in mouth/teeth area.

## 2017-07-12 NOTE — Discharge Instructions (Signed)
Please take all of your antibiotics until finished!   You may develop abdominal discomfort or diarrhea from the antibiotic.  You may help offset this with probiotics which you can buy or get in yogurt. Do not eat  or take the probiotics until 2 hours after your antibiotic.   Alternate 600 mg of ibuprofen and [phone] mg of Tylenol every 3 hours as needed for pain. Do not exceed 4000 mg of Tylenol daily.  Apply cool compresses 2-3 times daily.  Keep the area clean and dry.  Follow-up with a primary care physician for reevaluation of your high blood pressure and for reevaluation of your cellulitis.  I have attached information for [facility] which you can follow-up with for [facility].  Follow-up with a dentist for evaluation of your cavities.  I have attached resources for you to follow-up with in order to find a dentist.    Return to the ED immediately for any concerning signs or symptoms develop such as fevers, worsening swelling or redness, difficulty swallowing or breathing, or or drooling.

## 2017-07-28 ENCOUNTER — Emergency Department (HOSPITAL_COMMUNITY)
Admission: EM | Admit: 2017-07-28 | Discharge: 2017-07-28 | Disposition: A | Discharge status: Home / Self Care | Payer: Medicaid Other | Attending: Physician Assistant | Admitting: Physician Assistant

## 2017-07-28 ENCOUNTER — Encounter (HOSPITAL_COMMUNITY): Payer: Self-pay | Admitting: Emergency Medicine

## 2017-07-28 ENCOUNTER — Other Ambulatory Visit: Payer: Self-pay

## 2017-07-28 DIAGNOSIS — Z79899 Other long term (current) drug therapy: Secondary | ICD-10-CM | POA: Insufficient documentation

## 2017-07-28 DIAGNOSIS — J45909 Unspecified asthma, uncomplicated: Secondary | ICD-10-CM | POA: Insufficient documentation

## 2017-07-28 DIAGNOSIS — I10 Essential (primary) hypertension: Secondary | ICD-10-CM | POA: Insufficient documentation

## 2017-07-28 DIAGNOSIS — F17210 Nicotine dependence, cigarettes, uncomplicated: Secondary | ICD-10-CM | POA: Insufficient documentation

## 2017-07-28 DIAGNOSIS — M7731 Calcaneal spur, right foot: Secondary | ICD-10-CM | POA: Insufficient documentation

## 2017-07-28 DIAGNOSIS — M79671 Pain in right foot: Secondary | ICD-10-CM

## 2017-07-28 DIAGNOSIS — L84 Corns and callosities: Secondary | ICD-10-CM | POA: Insufficient documentation

## 2017-07-28 MED ORDER — IBUPROFEN 800 MG PO TABS: 800.0000 mg | ORAL_TABLET | Freq: Three times a day (TID) | ORAL | 2 refills | Status: DC | PRN

## 2017-07-28 NOTE — Discharge Instructions (Signed)
Use the callus/corn cushions to protect the area. Perform warm water soaks to help with pain. Alternate between tylenol and ibuprofen as directed as needed for pain. Wear shoes with padding in them to protect the area. Follow up with the podiatrist in 1-2 weeks for ongoing management of your foot pain. Return to the ER for changes or worsening symptoms.

## 2017-07-28 NOTE — ED Triage Notes (Signed)
Pt complaint of worsening right heel pain related to hx of heel spurs.

## 2017-07-28 NOTE — ED Provider Notes (Signed)
[facility]-EMERGENCY DEPT Provider Note   CSN: [id number] Arrival date & time: 07/28/17  1046     History   Chief Complaint Chief Complaint  Patient presents with  . Foot Pain    HPI Thomasene RippleStephanie L Kazanjian is a 51 y.o. female with a PMHx of DDD, asthma, HTN, HLD, osteoarthritis, sciatica, and other conditions listed below, who presents to the ED with complaints of right heel pain that has been ongoing for several years however has gradually worsened over the last 3 weeks.  She has a heel spur which has caused a callus, several years ago it fell off on its own however it seems to have recurred.  She describes the pain as 9/10 constant throbbing nonradiating right posterior heel pain, worse with palpation, walking, and going up steps, and significantly improved with ibuprofen, and slightly improved with Tylenol and heat.  She is not currently insured therefore she has not been able to follow-up with a podiatrist for this issue.  She denies any erythema, swelling, bruising, numbness, tingling, focal weakness, or any other complaints at this time.  No injury or trauma to the area.    The history is provided by the patient and medical records. No language interpreter was used.  Foot Pain  This is a recurrent problem. The current episode started more than 1 week ago. The problem occurs constantly. The problem has been gradually worsening. The symptoms are aggravated by walking (and palpation). The symptoms are relieved by acetaminophen, NSAIDs and heat. She has tried acetaminophen and a warm compress (and ibuprofen) for the symptoms. The treatment provided moderate relief.    Past Medical History:  Diagnosis Date  . Asthma   . Bipolar 1 disorder (HCC)   . Chronic lumbar pain   . DDD (degenerative disc disease)   . Depression   . GERD (gastroesophageal reflux disease)   . History of gastric ulcer   . Hyperlipidemia   . Hypertension   . Migraine   . Osteoarthritis   .  Right ACL tear   . Right knee meniscal tear   . Sciatica   . Wears glasses     Patient Active Problem List   Diagnosis Date Noted  . Airway problem   . 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 Hypokalemia 07/30/2016  . Dehydration 07/30/2016  . Alcohol abuse 07/30/2016  . S/P ACL reconstruction 01/31/2014  . DEPRESSION 11/17/2008  . ACUTE SINUSITIS, UNSPECIFIED 11/17/2008  . ALLERGIC RHINITIS 11/17/2008  . GERD 11/17/2008  . HEADACHE 11/17/2008    Past Surgical History:  Procedure Laterality Date  . ABDOMINAL HYSTERECTOMY    . BUNIONECTOMY Left 2011  . HEEL SPUR SURGERY Right 2012  . KNEE ARTHROSCOPY WITH ANTERIOR CRUCIATE LIGAMENT (ACL) REPAIR WITH HAMSTRING GRAFT Right 01/31/2014   Procedure: RIGHT KNEE ARTHROSCOPY WITH ALLOGRAFT (ACL) ANTERIOR CRUCIATE LIGAMENT RECONSTRUCTON PARTIAL MENISCECTOMY VERSES REPAIR ;  Surgeon: Eugenia Mcalpineobert Collins, MD;  [facility];  Service: Orthopedics;  Laterality: Right;  . TOTAL ABDOMINAL HYSTERECTOMY W/ BILATERAL SALPINGOOPHORECTOMY  04-20-2006    OB History    No data available       Home Medications    Prior to Admission medications   Medication Sig Start Date End Date Taking? Authorizing Provider  acetaminophen (TYLENOL) 500 MG tablet Take 1 tablet (500 mg total) by mouth every 6 (six) hours as needed. 07/12/17   Fawze, Mina A, PA-C  Aspirin-Acetaminophen-Caffeine (GOODY HEADACHE PO) Take 2 packets by mouth daily as needed (pain).  [provider]  calcium carbonate (TUMS EX) 750 MG chewable tablet Chew 1 tablet by mouth 2 (two) times daily as needed for heartburn.    [provider]  chlorpheniramine-HYDROcodone (TUSSIONEX) 10-8 MG/5ML SUER Take 5 mLs by mouth every 12 (twelve) hours as needed for cough. Patient not taking: Reported on 07/06/2017 08/01/16   Dorothea OgleMyers, Iskra M, MD  cholecalciferol (VITAMIN D) 1000 units tablet Take 1,000 Units by mouth daily.    [provider]  clindamycin  (CLEOCIN) 300 MG capsule Take 1 capsule (300 mg total) by mouth 3 (three) times daily. Patient not taking: Reported on 07/06/2017 08/01/16   Dorothea OgleMyers, Iskra M, MD  cyclobenzaprine (FLEXERIL) 10 MG tablet Take 1 tablet (10 mg total) by mouth 2 (two) times daily as needed for muscle spasms. Patient not taking: Reported on 07/06/2017 05/30/17   Kellie ShropshireShrosbree, Emily J, PA-C  famotidine (PEPCID) 40 MG tablet Take 1 tablet (40 mg total) by mouth at bedtime. Patient taking differently: Take 40 mg by mouth at bedtime as needed for heartburn or indigestion.  08/03/15   Sam, Ace GinsSerena Y, PA-C  ibuprofen (ADVIL,MOTRIN) 800 MG tablet Take 1 tablet (800 mg total) by mouth 3 (three) times daily. 07/12/17   Fawze, Mina A, PA-C  lisinopril-hydrochlorothiazide (PRINZIDE,ZESTORETIC) 20-12.5 MG tablet Take 1 tablet by mouth daily. 07/12/17 08/11/17  Michela PitcherFawze, Mina A, PA-C  meloxicam (MOBIC) 15 MG tablet Take 15 mg by mouth daily.    [provider]  omeprazole (PRILOSEC) 40 MG capsule Take 1 capsule (40 mg total) by mouth daily. Patient not taking: Reported on 07/06/2017 08/03/15   Carlene CoriaSam, Serena Y, PA-C  traMADol (ULTRAM) 50 MG tablet Take 1 tablet (50 mg total) by mouth every 6 (six) hours as needed. Patient not taking: Reported on 07/06/2017 08/01/16   Dorothea OgleMyers, Iskra M, MD    Family History Family History  Problem Relation Age of Onset  . Hypertension Father     Social History Social History   Tobacco Use  . Smoking status: Current Every Day Smoker    Packs/day: 0.50    Years: 32.00    Pack years: 16.00    Types: Cigarettes  . Smokeless tobacco: Never Used  Substance Use Topics  . Alcohol use: Yes    Alcohol/week: 8.4 oz    Types: 14 Cans of beer per week    Comment: 4 40 ounce beers and liquor  . Drug use: Yes    Types: Marijuana    Comment: occasional  marijuana--  hx  "crack" use  last used 2004--  no treatment rehab (done on her own)     Allergies   Diphenhydramine hcl   Review of  Systems Review of Systems  Musculoskeletal: Positive for arthralgias. Negative for joint swelling.  Skin: Positive for wound (callus). Negative for color change.  Allergic/Immunologic: Negative for immunocompromised state.  Neurological: Negative for weakness and numbness.     Physical Exam Updated Vital Signs BP (!) 126/94 (BP Location: Right Arm)   Pulse 84   Temp 98.4 F (36.9 C) (Oral)   Resp 14   SpO2 100%   Physical Exam  Constitutional: She is oriented to person, place, and time. Vital signs are normal. She appears well-developed and well-nourished.  Non-toxic appearance. No distress.  Afebrile, nontoxic, NAD  HENT:  Head: Normocephalic and atraumatic.  Mouth/Throat: Mucous membranes are normal.  Eyes: Conjunctivae and EOM are normal. Right eye exhibits no discharge. Left eye exhibits no discharge.  Neck: Normal range of motion.  Neck supple.  Cardiovascular: Normal rate and intact distal pulses.  Pulmonary/Chest: Effort normal. No respiratory distress.  Abdominal: Normal appearance. She exhibits no distension.  Musculoskeletal: Normal range of motion.       Right ankle: She exhibits normal range of motion, no swelling, no ecchymosis, no deformity, no laceration and normal pulse. Tenderness. Achilles tendon normal.       Feet:  R ankle with FROM intact, no swelling, no crepitus or deformity, with mild TTP to the posterior calcaneus at the insertion site of the achilles tendon, overlying a small callus. No warmth/erythema. No TTP or swelling of fore foot or calf, or remainder of the ankle. No bruising. Achilles intact. Good pedal pulse and cap refill of all toes. Wiggling toes without difficulty. Sensation grossly intact. Soft compartments   Neurological: She is alert and oriented to person, place, and time. She has normal strength. No sensory deficit.  Skin: Skin is warm, dry and intact. No rash noted.  Psychiatric: She has a normal mood and affect. Her behavior is normal.   Nursing note and vitals reviewed.    ED Treatments / Results  Labs (all labs ordered are listed, but only abnormal results are displayed) Labs Reviewed - No data to display  EKG  EKG Interpretation None       Radiology No results found.  Procedures Procedures (including critical care time)  Medications Ordered in ED Medications - No data to display   Initial Impression / Assessment and Plan / ED Course  I have reviewed the triage vital signs and the nursing notes.  Pertinent labs & imaging results that were available during my care of the patient were reviewed by me and considered in my medical decision making (see chart for details).     51 y.o. female here with R heel spur and callus pain ongoing for years but worse x3wks. On exam, callus to posterior heel at achilles tendon insertion site, mildly tender, otherwise no focal bony TTP. No crepitus or deformity, FROM intact, no erythema/warmth, no swelling. NVI with soft compartments. Doubt need for imaging. Advised cushioning area, warm soaks, tylenol/ibuprofen for pain, and f/up with podiatry. I explained the diagnosis and have given explicit precautions to return to the ER including for any other new or worsening symptoms. The patient understands and accepts the medical plan as it's been dictated and I have answered their questions. Discharge instructions concerning home care and prescriptions have been given. The patient is STABLE and is discharged to home in good condition.    Final Clinical Impressions(s) / ED Diagnoses   Final diagnoses:  Pain of right heel  Heel spur, right  Callus of heel    ED Discharge Orders        Ordered    ibuprofen (ADVIL,MOTRIN) 800 MG tablet  Every 8 hours PRN     07/28/17 [address] [place], PA-C 07/28/17 1302    Mackuen, Cindee Salt, MD 07/28/17 2052

## 2017-07-28 NOTE — ED Notes (Signed)
Pt verbalizes understanding of d/c paperwork, follow up instructions, and medications. Pt A/O x4, ambulatory. All belongings with patient upon departure.  

## 2017-08-18 ENCOUNTER — Ambulatory Visit: Payer: Self-pay | Attending: Internal Medicine | Admitting: Physician Assistant

## 2017-08-18 VITALS — BP 173/104 | HR 74 | Temp 98.7°F | Resp 16 | Ht 63.0 in | Wt 151.0 lb

## 2017-08-18 DIAGNOSIS — F319 Bipolar disorder, unspecified: Secondary | ICD-10-CM | POA: Insufficient documentation

## 2017-08-18 DIAGNOSIS — M5136 Other intervertebral disc degeneration, lumbar region: Secondary | ICD-10-CM | POA: Insufficient documentation

## 2017-08-18 DIAGNOSIS — F17210 Nicotine dependence, cigarettes, uncomplicated: Secondary | ICD-10-CM | POA: Insufficient documentation

## 2017-08-18 DIAGNOSIS — M79671 Pain in right foot: Secondary | ICD-10-CM

## 2017-08-18 DIAGNOSIS — L84 Corns and callosities: Secondary | ICD-10-CM | POA: Insufficient documentation

## 2017-08-18 DIAGNOSIS — I10 Essential (primary) hypertension: Secondary | ICD-10-CM | POA: Insufficient documentation

## 2017-08-18 DIAGNOSIS — J45909 Unspecified asthma, uncomplicated: Secondary | ICD-10-CM | POA: Insufficient documentation

## 2017-08-18 DIAGNOSIS — G8929 Other chronic pain: Secondary | ICD-10-CM | POA: Insufficient documentation

## 2017-08-18 DIAGNOSIS — E785 Hyperlipidemia, unspecified: Secondary | ICD-10-CM | POA: Insufficient documentation

## 2017-08-18 DIAGNOSIS — Z79899 Other long term (current) drug therapy: Secondary | ICD-10-CM | POA: Insufficient documentation

## 2017-08-18 DIAGNOSIS — M545 Low back pain: Secondary | ICD-10-CM | POA: Insufficient documentation

## 2017-08-18 DIAGNOSIS — K219 Gastro-esophageal reflux disease without esophagitis: Secondary | ICD-10-CM | POA: Insufficient documentation

## 2017-08-18 DIAGNOSIS — F17200 Nicotine dependence, unspecified, uncomplicated: Secondary | ICD-10-CM

## 2017-08-18 MED ORDER — LISINOPRIL-HYDROCHLOROTHIAZIDE 20-12.5 MG PO TABS: 1.0000 | ORAL_TABLET | Freq: Every day | ORAL | 1 refills | Status: DC

## 2017-08-18 MED ORDER — METOPROLOL SUCCINATE ER 25 MG PO TB24: 25.0000 mg | ORAL_TABLET | Freq: Every day | ORAL | 1 refills | Status: DC

## 2017-08-18 NOTE — Progress Notes (Signed)
Dry cough mostly at night Problem sleeping Heel spur

## 2017-08-18 NOTE — Progress Notes (Signed)
[name] [name] Hoel  [id number]  [id number]  DOB - 04/29/1966  Chief Complaint  Patient presents with  . Follow-up    ED       Subjective:   [name] [name] is a 52 y.o. female here today for establishment of care. History of hypertension, chronic low back pain/DDD, smoking, bipolar disorder and hyperlipidemia. She's not been seen by primary care provider for 1-2 years. She presented to the emergency room on 07/28/2017 with right heel pain. This is acute on chronic. She previously has had heel spurs and now has developed a callus. This limits her shoe wearing. It limits her mobility. Is very painful. No imaging was done. No labs were done. She was encouraged of supportive treatment, anti-inflammatories and a podiatry referral.  Symptoms as above. No new complaints today. She states she has been on blood pressure medications in the past but has been off. She smokes one third pack per day of cigarettes. No chest pain. No shortness of breath.  ROS: GEN: denies fever or chills, denies change in weight Skin: denies lesions or rashes HEENT: denies headache, earache, epistaxis, sore throat, or neck pain LUNGS: denies SHOB, dyspnea, PND, orthopnea CV: denies CP or palpitations ABD: denies abd pain, N or V EXT: denies muscle spasms or swelling; no pain in lower ext except right foot, no weakness NEURO: denies numbness or tingling, denies sz, stroke or TIA  ALLERGIES: Allergies  Allergen Reactions  . Diphenhydramine Hcl Itching    PAST MEDICAL HISTORY: Past Medical History:  Diagnosis Date  . Asthma   . Bipolar 1 disorder (HCC)   . Chronic lumbar pain   . DDD (degenerative disc disease)   . Depression   . GERD (gastroesophageal reflux disease)   . History of gastric ulcer   . Hyperlipidemia   . Hypertension   . Migraine   . Osteoarthritis   . Right ACL tear   . Right knee meniscal tear   . Sciatica   . Wears glasses     PAST SURGICAL HISTORY: Past Surgical  History:  Procedure Laterality Date  . ABDOMINAL HYSTERECTOMY    . BUNIONECTOMY Left 2011  . HEEL SPUR SURGERY Right 2012  . KNEE ARTHROSCOPY WITH ANTERIOR CRUCIATE LIGAMENT (ACL) REPAIR WITH HAMSTRING GRAFT Right 01/31/2014   Procedure: RIGHT KNEE ARTHROSCOPY WITH ALLOGRAFT (ACL) ANTERIOR CRUCIATE LIGAMENT RECONSTRUCTON PARTIAL MENISCECTOMY VERSES REPAIR ;  Surgeon: Eugenia Mcalpineobert Collins, MD;  [facility];  Service: Orthopedics;  Laterality: Right;  . TOTAL ABDOMINAL HYSTERECTOMY W/ BILATERAL SALPINGOOPHORECTOMY  04-20-2006    MEDICATIONS AT HOME: Prior to Admission medications   Medication Sig Start Date End Date Taking? Authorizing Provider  ibuprofen (ADVIL,MOTRIN) 800 MG tablet Take 1 tablet (800 mg total) by mouth 3 (three) times daily. 07/12/17  Yes Fawze, Mina A, PA-C  acetaminophen (TYLENOL) 500 MG tablet Take 1 tablet (500 mg total) by mouth every 6 (six) hours as needed. 07/12/17   Luevenia MaxinFawze, Mina A, PA-C  calcium carbonate (TUMS EX) 750 MG chewable tablet Chew 1 tablet by mouth 2 (two) times daily as needed for heartburn.    [provider]  chlorpheniramine-HYDROcodone (TUSSIONEX) 10-8 MG/5ML SUER Take 5 mLs by mouth every 12 (twelve) hours as needed for cough. Patient not taking: Reported on 07/06/2017 08/01/16   Dorothea OgleMyers, Iskra M, MD  clindamycin (CLEOCIN) 300 MG capsule Take 1 capsule (300 mg total) by mouth 3 (three) times daily. Patient not taking: Reported on 07/06/2017 08/01/16   Dorothea OgleMyers, Iskra M, MD  cyclobenzaprine (FLEXERIL) 10 MG tablet Take 1 tablet (10 mg total) by mouth 2 (two) times daily as needed for muscle spasms. Patient not taking: Reported on 07/06/2017 05/30/17   Kellie Shropshire, PA-C  famotidine (PEPCID) 40 MG tablet Take 1 tablet (40 mg total) by mouth at bedtime. Patient not taking: Reported on 07/28/2017 08/03/15   Carlene Coria, PA-C  ibuprofen (ADVIL,MOTRIN) 800 MG tablet Take 1 tablet (800 mg total) by mouth every 8 (eight) hours as  needed for fever, headache, moderate pain or cramping. 07/28/17   Street, [place], PA-C  lisinopril-hydrochlorothiazide (PRINZIDE,ZESTORETIC) 20-12.5 MG tablet Take 1 tablet by mouth daily. 08/18/17 09/17/17  Vivianne Master, PA-C  metoprolol succinate (TOPROL-XL) 25 MG 24 hr tablet Take 1 tablet (25 mg total) by mouth daily. 08/18/17   Vivianne Master, PA-C  omeprazole (PRILOSEC) 40 MG capsule Take 1 capsule (40 mg total) by mouth daily. Patient not taking: Reported on 07/06/2017 08/03/15   Carlene Coria, PA-C  traMADol (ULTRAM) 50 MG tablet Take 1 tablet (50 mg total) by mouth every 6 (six) hours as needed. Patient not taking: Reported on 07/06/2017 08/01/16   Dorothea Ogle, MD    Family History  Problem Relation Age of Onset  . Hypertension Father     Objective:   Vitals:   08/18/17 1032  BP: (!) 173/104  Pulse: 74  Resp: 16  Temp: 98.7 F (37.1 C)  TempSrc: Oral  SpO2: 100%  Weight: 151 lb (68.5 kg)  Height: 5\' 3"  (1.6 m)    Exam General appearance : Awake, alert, not in any distress. Speech Clear. Not toxic looking HEENT: Atraumatic and Normocephalic, pupils equally reactive to light and accomodation Neck: supple, no JVD. No cervical lymphadenopathy.  Chest:Good air entry bilaterally, no added sounds  CVS: S1 S2 regular, no murmurs.  Abdomen: Bowel sounds present, Non tender and not distended with no guarding, rigidity or rebound. Extremities: B/L Lower Ext shows no edema, both legs are warm to touch Neurology: Awake alert, and oriented X 3, CN II-XII intact, Non focal Skin:No Rash   Data Review Lab Results  Component Value Date   HGBA1C 5.4 07/31/2016     Assessment & Plan  1. Right heel callous  -supportive shoes, bandages  -prn pain meds  -podiatry referral  2. Smoker  -cessation discussed, not ready to quit    3. HTN  -DASH diet  -refill Lisinopril/HCTZ  -add Toprol  -needs BMP next visit   Financial assistance Return in about 4 weeks (around  09/15/2017).  The patient was given clear instructions to go to ER or return to medical center if symptoms don't improve, worsen or new problems develop. The patient verbalized understanding. The patient was told to call to get lab results if they haven't heard anything in the next week.   Total time spent with patient was 31 min. Greater than 50 % of this visit was spent face to face counseling and coordinating care regarding risk factor modification, compliance importance and encouragement, education related to blood pressure.  This note has been created with Education officer, environmental. Any transcriptional errors are unintentional.    Scot Jun, PA-C [facility] [place], [place] [phone]   08/18/2017, 11:03 AM

## 2017-08-28 ENCOUNTER — Ambulatory Visit: Payer: Medicaid Other

## 2017-09-04 ENCOUNTER — Ambulatory Visit: Payer: Medicaid Other | Attending: Internal Medicine

## 2017-09-14 ENCOUNTER — Ambulatory Visit: Payer: Medicaid Other | Admitting: Internal Medicine

## 2017-10-10 ENCOUNTER — Ambulatory Visit: Payer: Medicaid Other | Admitting: Internal Medicine

## 2017-11-02 ENCOUNTER — Ambulatory Visit: Payer: Medicaid Other

## 2017-11-22 ENCOUNTER — Ambulatory Visit: Payer: Medicaid Other

## 2017-11-23 ENCOUNTER — Ambulatory Visit: Payer: Self-pay | Attending: Internal Medicine | Admitting: Internal Medicine

## 2017-11-23 ENCOUNTER — Encounter: Payer: Self-pay | Admitting: Internal Medicine

## 2017-11-23 VITALS — BP 136/88 | HR 73 | Temp 98.5°F | Resp 16 | Ht 63.0 in | Wt 142.8 lb

## 2017-11-23 DIAGNOSIS — G4700 Insomnia, unspecified: Secondary | ICD-10-CM | POA: Insufficient documentation

## 2017-11-23 DIAGNOSIS — M25561 Pain in right knee: Secondary | ICD-10-CM | POA: Insufficient documentation

## 2017-11-23 DIAGNOSIS — Z79899 Other long term (current) drug therapy: Secondary | ICD-10-CM | POA: Insufficient documentation

## 2017-11-23 DIAGNOSIS — Z1231 Encounter for screening mammogram for malignant neoplasm of breast: Secondary | ICD-10-CM

## 2017-11-23 DIAGNOSIS — M545 Low back pain, unspecified: Secondary | ICD-10-CM

## 2017-11-23 DIAGNOSIS — F1210 Cannabis abuse, uncomplicated: Secondary | ICD-10-CM | POA: Insufficient documentation

## 2017-11-23 DIAGNOSIS — F1020 Alcohol dependence, uncomplicated: Secondary | ICD-10-CM | POA: Insufficient documentation

## 2017-11-23 DIAGNOSIS — G8929 Other chronic pain: Secondary | ICD-10-CM | POA: Insufficient documentation

## 2017-11-23 DIAGNOSIS — Z1239 Encounter for other screening for malignant neoplasm of breast: Secondary | ICD-10-CM

## 2017-11-23 DIAGNOSIS — I10 Essential (primary) hypertension: Secondary | ICD-10-CM | POA: Insufficient documentation

## 2017-11-23 DIAGNOSIS — K219 Gastro-esophageal reflux disease without esophagitis: Secondary | ICD-10-CM | POA: Insufficient documentation

## 2017-11-23 DIAGNOSIS — F319 Bipolar disorder, unspecified: Secondary | ICD-10-CM | POA: Insufficient documentation

## 2017-11-23 DIAGNOSIS — Z131 Encounter for screening for diabetes mellitus: Secondary | ICD-10-CM

## 2017-11-23 DIAGNOSIS — F17210 Nicotine dependence, cigarettes, uncomplicated: Secondary | ICD-10-CM | POA: Insufficient documentation

## 2017-11-23 DIAGNOSIS — F10982 Alcohol use, unspecified with alcohol-induced sleep disorder: Secondary | ICD-10-CM | POA: Insufficient documentation

## 2017-11-23 DIAGNOSIS — L03211 Cellulitis of face: Secondary | ICD-10-CM | POA: Insufficient documentation

## 2017-11-23 DIAGNOSIS — K029 Dental caries, unspecified: Secondary | ICD-10-CM | POA: Insufficient documentation

## 2017-11-23 DIAGNOSIS — F17200 Nicotine dependence, unspecified, uncomplicated: Secondary | ICD-10-CM | POA: Insufficient documentation

## 2017-11-23 MED ORDER — SERTRALINE HCL 50 MG PO TABS: ORAL_TABLET | ORAL | 1 refills | Status: DC

## 2017-11-23 MED ORDER — OMEPRAZOLE 40 MG PO CPDR: 40.0000 mg | DELAYED_RELEASE_CAPSULE | Freq: Every day | ORAL | 4 refills | Status: DC

## 2017-11-23 MED ORDER — VITAMIN B-1 100 MG PO TABS: 100.0000 mg | ORAL_TABLET | Freq: Every day | ORAL | 0 refills | Status: DC

## 2017-11-23 MED ORDER — CLINDAMYCIN HCL 300 MG PO CAPS: 300.0000 mg | ORAL_CAPSULE | Freq: Three times a day (TID) | ORAL | 0 refills | Status: DC

## 2017-11-23 MED ORDER — IBUPROFEN 800 MG PO TABS: 800.0000 mg | ORAL_TABLET | Freq: Three times a day (TID) | ORAL | 2 refills | Status: DC | PRN

## 2017-11-23 MED ORDER — HYDROXYZINE HCL 25 MG PO TABS: 25.0000 mg | ORAL_TABLET | Freq: Every evening | ORAL | 1 refills | Status: DC | PRN

## 2017-11-23 MED ORDER — LISINOPRIL-HYDROCHLOROTHIAZIDE 20-12.5 MG PO TABS: 1.0000 | ORAL_TABLET | Freq: Every day | ORAL | 5 refills | Status: DC

## 2017-11-23 MED FILL — IBUPROFEN 800 MG TABLET: 800 | 20 days supply | Qty: 60 | Fill #0

## 2017-11-23 MED FILL — OMEPRAZOLE DR 40 MG CAPSULE: 40 | 30 days supply | Qty: 30 | Fill #0

## 2017-11-23 MED FILL — LISINOPRIL-HCTZ 20-12.5 MG: 20-12.5 | 30 days supply | Qty: 30 | Fill #0

## 2017-11-23 MED FILL — hydrOXYzine HCL 25 MG TABS: 25 | 30 days supply | Qty: 30 | Fill #0

## 2017-11-23 MED FILL — CLINDAMYCIN HCL 300 MG CAP: 300 | 7 days supply | Qty: 21 | Fill #0

## 2017-11-23 NOTE — Progress Notes (Signed)
Pt states her stomach ulcers has been acting up  Pt states she had cellulitis on the face 2 months ago and it's coming back  Pt states the metoprolol gives her heart palpitations s she stopped taking it   Pt is needing medication refills   Pt states the Tramadol doesn't help with her pain

## 2017-11-23 NOTE — Progress Notes (Signed)
Patient ID: [name] [name], female    DOB: 06/03/1966  MRN: [id number]  CC: Establish Care; Back Pain; and Insomnia   Subjective: [name] [name] is a 52 y.o. female who presents for chronic disease management and to establish with me as PCP. Her concerns today include:  Patient with history of HTN, tob dep, bipolar disorder (was seeing a psychiatrist in past), HL, chronic LBP which she attributes to arthritis, ETOH abuse, ACD, marijuana, GERD  Previous PCP was a [facility].  She lost Medicaid and could not afford her medications so she had not seen a doctor in a while.  She saw physician assistant here in January.  HTN:  On Lis/HCTZ which she usually takes every morning but forgot to take this morning.  She just took it a few minutes ago..  Metoprolol was added on last visit but she stopped taking it after the first dose because it caused palpitations.   Check QOD wrist cuff and reports that levels have been a little high in the 160s for systolic.  ETOH use:  Drinks tweleve 40 oz beers a day.  "I drink my pain a way and I miss my mom."  Mom decease 5 yrs ago.  Also 33 yr old grand-daughter living with her she creates a lot of stress for her.  She admits to depression and feels that she would benefit from being on medication for it.  Gives history of bipolar disorder for which she had seen a psychiatrist in the past. -Regards to alcohol she feels that she can stop if she wants to.  She is wanting to quit or at least cut back significantly.  She reportedly quit for about 2 months several months ago but started up again.  She has never been in any treatment program for alcohol abuse and is not interested in doing so.  She has 4 children and her youngest son constantly gets on her about drinking so much.  Patient does not drive.  She has had serious injury to her right knee about 2 years ago when she fell down her stairs at home.  Knee injury required surgery.   -Clean cocaine 13 yrs.   Smokes marijuana QOD. Treatment program in past for cocaine. -She smokes cigarettes about 1 pack a week. -She is currently unemployed.  She has applied for disability and was denied x4.  C/o pain pain RT knee and heel spur.  Spur in heel has been shaved x 2. Last 2 yrs ago. Will see podiatrist 12/04/2017.  Had surgery on knee 3-4 yrs ago.  Fell down steps.  Chronic pain since then Pain lower back thoracic to lumbar - "all day every day."Attributes this to arthritis.  Reportedly saw orthopedics in the past for this about 3 years ago and was told that she will eventually need surgery.  She denies any numbness or tingling in the legs.  No pain radiating down the legs.    Swelling RT jaw, very tender, cellulitis Patient Active Problem List   Diagnosis Date Noted  . Airway problem   . 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 Hypokalemia 07/30/2016  . Dehydration 07/30/2016  . Alcohol abuse 07/30/2016  . S/P ACL reconstruction 01/31/2014  . DEPRESSION 11/17/2008  . ACUTE SINUSITIS, UNSPECIFIED 11/17/2008  . ALLERGIC RHINITIS 11/17/2008  . GERD 11/17/2008  . HEADACHE 11/17/2008     Current Outpatient Medications on File Prior to Visit  Medication Sig Dispense Refill  . acetaminophen (TYLENOL) 500 MG tablet Take 1  tablet (500 mg total) by mouth every 6 (six) hours as needed. 30 tablet 0  . calcium carbonate (TUMS EX) 750 MG chewable tablet Chew 1 tablet by mouth 2 (two) times daily as needed for heartburn.    . chlorpheniramine-HYDROcodone (TUSSIONEX) 10-8 MG/5ML SUER Take 5 mLs by mouth every 12 (twelve) hours as needed for cough. (Patient not taking: Reported on 07/06/2017) 120 mL 0  . clindamycin (CLEOCIN) 300 MG capsule Take 1 capsule (300 mg total) by mouth 3 (three) times daily. (Patient not taking: Reported on 07/06/2017) 21 capsule 0  . cyclobenzaprine (FLEXERIL) 10 MG tablet Take 1 tablet (10 mg total) by mouth 2 (two) times daily as needed for muscle spasms. (Patient not taking: Reported  on 07/06/2017) 20 tablet 0  . famotidine (PEPCID) 40 MG tablet Take 1 tablet (40 mg total) by mouth at bedtime. (Patient not taking: Reported on 07/28/2017) 30 tablet 0  . ibuprofen (ADVIL,MOTRIN) 800 MG tablet Take 1 tablet (800 mg total) by mouth 3 (three) times daily. 21 tablet 0  . ibuprofen (ADVIL,MOTRIN) 800 MG tablet Take 1 tablet (800 mg total) by mouth every 8 (eight) hours as needed for fever, headache, moderate pain or cramping. 60 tablet 2  . lisinopril-hydrochlorothiazide (PRINZIDE,ZESTORETIC) 20-12.5 MG tablet Take 1 tablet by mouth daily. 30 tablet 1  . metoprolol succinate (TOPROL-XL) 25 MG 24 hr tablet Take 1 tablet (25 mg total) by mouth daily. 30 tablet 1  . omeprazole (PRILOSEC) 40 MG capsule Take 1 capsule (40 mg total) by mouth daily. (Patient not taking: Reported on 07/06/2017) 30 capsule 0  . traMADol (ULTRAM) 50 MG tablet Take 1 tablet (50 mg total) by mouth every 6 (six) hours as needed. (Patient not taking: Reported on 07/06/2017) 30 tablet 0   No current facility-administered medications on file prior to visit.     Allergies  Allergen Reactions  . Diphenhydramine Hcl Itching    Social History   Socioeconomic History  . Marital status: Legally Separated    Spouse name: Not on file  . Number of children: Not on file  . Years of education: Not on file  . Highest education level: Not on file  Occupational History  . Not on file  Social Needs  . Financial resource strain: Not on file  . Food insecurity:    Worry: Not on file    Inability: Not on file  . Transportation needs:    Medical: Not on file    Non-medical: Not on file  Tobacco Use  . Smoking status: Current Every Day Smoker    Packs/day: 0.25    Years: 32.00    Pack years: 8.00    Types: Cigarettes  . Smokeless tobacco: Never Used  Substance and Sexual Activity  . Alcohol use: Yes    Alcohol/week: 8.4 oz    Types: 14 Cans of beer per week    Comment: 4 40 ounce beers and liquor  . Drug  use: Yes    Types: Marijuana    Comment: occasional  marijuana--  hx  "crack" use  last used 2004--  no treatment rehab (done on her own)  . Sexual activity: Not on file  Lifestyle  . Physical activity:    Days per week: Not on file    Minutes per session: Not on file  . Stress: Not on file  Relationships  . Social connections:    Talks on phone: Not on file    Gets together: Not on file  Attends religious service: Not on file    Active member of club or organization: Not on file    Attends meetings of clubs or organizations: Not on file    Relationship status: Not on file  . Intimate partner violence:    Fear of current or ex partner: Not on file    Emotionally abused: Not on file    Physically abused: Not on file    Forced sexual activity: Not on file  Other Topics Concern  . Not on file  Social History Narrative  . Not on file    Family History  Problem Relation Age of Onset  . Hypertension Father     Past Surgical History:  Procedure Laterality Date  . ABDOMINAL HYSTERECTOMY    . BUNIONECTOMY Left 2011  . HEEL SPUR SURGERY Right 2012  . KNEE ARTHROSCOPY WITH ANTERIOR CRUCIATE LIGAMENT (ACL) REPAIR WITH HAMSTRING GRAFT Right 01/31/2014   Procedure: RIGHT KNEE ARTHROSCOPY WITH ALLOGRAFT (ACL) ANTERIOR CRUCIATE LIGAMENT RECONSTRUCTON PARTIAL MENISCECTOMY VERSES REPAIR ;  Surgeon: Eugenia Mcalpineobert Collins, MD;  [facility];  Service: Orthopedics;  Laterality: Right;  . TOTAL ABDOMINAL HYSTERECTOMY W/ BILATERAL SALPINGOOPHORECTOMY  04-20-2006    ROS: Review of Systems  General: Complains of problems sleeping for several years.  She reports that even when she does not drink alcohol she still has problems sleeping.  Gets in bed around 10 PM at nights.  She leaves the TV on.  Reports that she can be up until 5 in the morning Complain of having decayed teeth with some missing fillings.  Also complains of swelling of the right jaw with soreness of the chin on  that side.  Had similar issue back in December of last year where she had a cellulitis and abscess on this side of the face.  It drained spontaneously.  Denies any fever.  PHYSICAL EXAM: BP 136/88   Pulse 73   Temp 98.5 F (36.9 C) (Oral)   Resp 16   Ht 5\' 3"  (1.6 m)   Wt 142 lb 12.8 oz (64.8 kg)   SpO2 98%   BMI 25.30 kg/m   BP 140/90 Physical Exam  General appearance - alert, well appearing, and in no distress Mental status - alert, oriented to person, place, and time, normal mood, behavior, speech, dress, motor activity, and thought processes Mouth - mucous membranes moist, pharynx normal without lesions.  Several feelings are broken off.  Decay of both third molars lower jaw.  No peridental abscess appreciated Mild swelling of the right jaw.  Mild swelling below the chin on the right side slight tender to touch.  No fluctuance appreciated Neck -no cervical lymphadenopathy Chest - clear to auscultation, no wheezes, rales or rhonchi, symmetric air entry Heart - normal rate, regular rhythm, normal S1, S2, no murmurs, rubs, clicks or gallops Abdomen - soft, nontender, nondistended, no masses or organomegaly Musculoskeletal -no tenderness on palpation of thoracic and lumbar spine.  Power in lower extremities 5 out of 5 bilaterally. RT Knee: Mild joint enlargement.  Good range of motion.  No point tenderness. Extremities -no lower extremity edema  Depression screen [facility] 08/18/2017  Decreased Interest 2  Down, Depressed, Hopeless 1  PHQ - 2 Score 3  Altered sleeping 3  Tired, decreased energy 2  Change in appetite 0  Feeling bad or failure about yourself  0  Trouble concentrating 0  Moving slowly or fidgety/restless 0  Suicidal thoughts 0  PHQ-9 Score 8  ASSESSMENT AND PLAN: 1. Essential hypertension Close to goal.  Continue lisinopril HCTZ.  I did not change the dose as she has just taken her medicine prior to me coming in the room.  Discontinue metoprolol since she  has not been taking it and it did cause palpitations. - Microalbumin / creatinine urine ratio - CBC - Comprehensive metabolic panel - Lipid panel - lisinopril-hydrochlorothiazide (PRINZIDE,ZESTORETIC) 20-12.5 MG tablet; Take 1 tablet by mouth daily.  Dispense: 30 tablet; Refill: 5  2. Alcohol use disorder, severe, dependence (HCC) Strongly encouraged her to get into a treatment program or attend AA meetings.  She is not interested in doing that but does want to quit. She is interested in trying naltrexone.  We will check baseline labs and if liver and kidney functions are okay we will prescribe naltrexone to help decrease her craving.  I advised patient that she should not be on any narcotic medications as it can cause withdrawals.  She denies being on any narcotic medications and denies heroin use. - thiamine (VITAMIN B-1) 100 MG tablet; Take 1 tablet (100 mg total) by mouth daily.  Dispense: 90 tablet; Refill: 0  3. Chronic midline low back pain without sciatica  -Should not requesting refill on ibuprofen.  Reports that this is the only medication that works for her back and knee pain.  Given her heavy alcohol use and history of GERD I recommend change to Celebrex.  Patient declines stating that she does not use the ibuprofen every day.  We will refill omeprazole for her to take daily. -Refer to PT - ibuprofen (ADVIL,MOTRIN) 800 MG tablet; Take 1 tablet (800 mg total) by mouth every 8 (eight) hours as needed for fever, headache, moderate pain or cramping.  Dispense: 60 tablet; Refill: 2 - DG Lumbar Spine Complete; Future - DG Thoracic Spine W/Swimmers; Future  4. Chronic pain of right knee The #3 above - ibuprofen (ADVIL,MOTRIN) 800 MG tablet; Take 1 tablet (800 mg total) by mouth every 8 (eight) hours as needed for fever, headache, moderate pain or cramping.  Dispense: 60 tablet; Refill: 2  5. Alcohol-induced insomnia (HCC) Good sleep hygiene discussed.  Encourage patient to cut back on  drinking.  Advised to turn off all lights and sounds when she gets in bed at night.  If she is unable to fall asleep after 40 minutes she should get up and watch TV or read until she feels sleepy. - hydrOXYzine (ATARAX/VISTARIL) 25 MG tablet; Take 1 tablet (25 mg total) by mouth at bedtime as needed.  Dispense: 30 tablet; Refill: 1  6. Marijuana abuse Discouraged use.  7. Tobacco dependence Patient knows that this is not a healthy habit but she wants to work on quitting alcohol first.  8. Breast cancer screening - MM Digital Screening; Future  9. Diabetes mellitus screening - Hemoglobin A1c  10. Gastroesophageal reflux disease without esophagitis - omeprazole (PRILOSEC) 40 MG capsule; Take 1 capsule (40 mg total) by mouth daily.  Dispense: 30 capsule; Refill: 4  11. Cellulitis, face Clindamycin times 7 days.  12. Decay, teeth - Ambulatory referral to Dentistry  Addendum: I had also spoken with the patient about starting her on medication for depression.  She was agreeable to starting Zoloft.  She denies any homicidal or suicidal ideation at this time.  Prescription sent to pharmacy.  Patient was given the opportunity to ask questions.  Patient verbalized understanding of the plan and was able to repeat key elements of the plan.   No  orders of the defined types were placed in this encounter.    Requested Prescriptions    No prescriptions requested or ordered in this encounter    No follow-ups on file.  Karle Plumber, MD, FACP

## 2017-11-24 ENCOUNTER — Other Ambulatory Visit: Payer: Self-pay | Admitting: Internal Medicine

## 2017-11-24 LAB — COMPREHENSIVE METABOLIC PANEL
ALT: 19 IU/L (ref 0–32)
AST: 32 IU/L (ref 0–40)
Albumin/Globulin Ratio: 1.3 (ref 1.2–2.2)
Albumin: 4.4 g/dL (ref 3.5–5.5)
Alkaline Phosphatase: 59 IU/L (ref 39–117)
BUN/Creatinine Ratio: 16 (ref 9–23)
BUN: 14 mg/dL (ref 6–24)
Bilirubin Total: <0.2 mg/dL (ref 0.0–1.2)
CO2: 27 mmol/L (ref 20–29)
Calcium: 9.4 mg/dL (ref 8.7–10.2)
Chloride: 99 mmol/L (ref 96–106)
Creatinine, Ser: 0.88 mg/dL (ref 0.57–1.00)
GFR calc Af Amer: 87 mL/min/{1.73_m2} (ref >59)
GFR calc non Af Amer: 75 mL/min/{1.73_m2} (ref >59)
Globulin, Total: 3.3 g/dL (ref 1.5–4.5)
Glucose: 73 mg/dL (ref 65–99)
Potassium: 4.5 mmol/L (ref 3.5–5.2)
Sodium: 137 mmol/L (ref 134–144)
Total Protein: 7.7 g/dL (ref 6.0–8.5)

## 2017-11-24 LAB — CBC
Hematocrit: 34.4 % (ref 34.0–46.6)
Hemoglobin: 11.5 g/dL (ref 11.1–15.9)
MCH: 31.9 pg (ref 26.6–33.0)
MCHC: 33.4 g/dL (ref 31.5–35.7)
MCV: 96 fL (ref 79–97)
Platelets: 361 10*3/uL (ref 150–379)
RBC: 3.6 x10E6/uL — ABNORMAL LOW (ref 3.77–5.28)
RDW: 13.8 % (ref 12.3–15.4)
WBC: 5.9 10*3/uL (ref 3.4–10.8)

## 2017-11-24 LAB — MICROALBUMIN / CREATININE URINE RATIO
Creatinine, Urine: 195.1 mg/dL
Microalb/Creat Ratio: 12 mg/g creat (ref 0.0–30.0)
Microalbumin, Urine: 23.4 ug/mL

## 2017-11-24 LAB — LIPID PANEL
Chol/HDL Ratio: 2.6 ratio (ref 0.0–4.4)
Cholesterol, Total: 250 mg/dL — ABNORMAL HIGH (ref 100–199)
HDL: 97 mg/dL (ref >39)
LDL Calculated: 138 mg/dL — ABNORMAL HIGH (ref 0–99)
Triglycerides: 75 mg/dL (ref 0–149)
VLDL Cholesterol Cal: 15 mg/dL (ref 5–40)

## 2017-11-24 LAB — HEMOGLOBIN A1C
Est. average glucose Bld gHb Est-mCnc: 117 mg/dL
Hgb A1c MFr Bld: 5.7 % — ABNORMAL HIGH (ref 4.8–5.6)

## 2017-11-24 MED ORDER — NALTREXONE HCL 50 MG PO TABS: 50.0000 mg | ORAL_TABLET | Freq: Every day | ORAL | 1 refills | Status: DC

## 2017-11-27 ENCOUNTER — Telehealth: Payer: Self-pay | Admitting: Internal Medicine

## 2017-11-27 MED FILL — NALTREXONE 50 MG TABLET: 50 | 30 days supply | Qty: 30 | Fill #0

## 2017-11-27 MED FILL — SERTRALINE HCL 50 MG TABS: 50 | 30 days supply | Qty: 30 | Fill #0

## 2017-11-27 NOTE — Telephone Encounter (Signed)
Returned pt call and went over lab results. Pt doesn't have any questions or concerns 

## 2017-11-27 NOTE — Telephone Encounter (Signed)
Pt. Called requesting to speak with her nurse regarding the lab results that she had done on her last OV. Please f/u with pt.

## 2017-11-28 ENCOUNTER — Telehealth: Payer: Self-pay | Admitting: Internal Medicine

## 2017-11-28 NOTE — Telephone Encounter (Signed)
Patient called and stated that she was recently put on medication to help her stop drinking alcohol. Patient stated she took it this morning and has been sick ever since, patient stated she is unable to hold anything on her stomach and her stomach is now sore. Please fu at your earliest convenience.

## 2017-11-28 NOTE — Telephone Encounter (Signed)
Spoke with Dr. Laural BenesJohnson and per Dr. Laural BenesJohnson she informed pt that while taking the naltrexone (DEPADE) 50 MG tablet she should not be taking any narcotics or street drugs and if she is it will make her have withdrawals. Per Dr. Laural BenesJohnson pt is to stop naltrexone (DEPADE) 50 MG tablet and proceed to the ER. Before I could call pt back pt called the office again. I spoke to pt and informed her of Dr. Laural BenesJohnson response and per pt she is not taking any narcotics or street drugs. Pt states she will stop the medication and if her symptoms reside she will not go to the ER but if it continues to be server she will go. I informed Dr. Laural BenesJohnson of pt response and she is aware

## 2017-11-28 NOTE — Telephone Encounter (Signed)
Phone call placed to patient this evening to follow-up on adverse reaction from naltrexone.  Patient states that about an hour after taking naltrexone today for the first time she developed nausea vomiting and diarrhea.  She reports that she has vomited at least about 7 times.  She took a hot bath this evening and was able to drink some water and Sprite and is beginning to feel better.  Patient confirms that she has not taken any narcotic medications or street drugs over the past 2 weeks so it is unlikely that she is having any withdrawals as a result of taking naltrexone not having any narcotic medications in her system.  Otherwise naltrexone can cause vomiting and diarrhea.  I advised her to push fluids including Gatorade.  Also advised taking some Pepto-Bismol.  I have asked her to give me a call tomorrow if she is not feeling any better. Patient reports that she did notice a decrease craving for alcohol today after taking naltrexone.  She is wondering whether she should try taking half a tablet the next time instead of 50 mg.  I told it is probably best just to discontinue taking the medicine altogether given this reaction.  She was wanting to know if there is another medication that she can try.  I told her about Antabuse but this is not carried at our pharmacy and patient is uninsured.  I can check with the pharmacy to see whether ordering it can be an option.

## 2017-11-28 NOTE — Telephone Encounter (Signed)
Spoke with pt and pt stated that she took the naltrexone (DEPADE) 50 MG tablet this morning and since taking the medicine she has not been feeling well. Pt states she has been vomiting all day to now where she is throwing up yellow stuff. Pt states she is having the shakes. Pt states when she smells food she starts to get nausea. Pt states she is not even able to keep to water down. I informed pt that I will send a message to the pcp and once I get a response back on what pcp would like for her to do I will contact her.

## 2017-12-04 ENCOUNTER — Ambulatory Visit: Payer: No Typology Code available for payment source

## 2017-12-04 ENCOUNTER — Ambulatory Visit: Payer: No Typology Code available for payment source | Admitting: Podiatry

## 2017-12-04 ENCOUNTER — Encounter: Payer: Self-pay | Admitting: Podiatry

## 2017-12-04 ENCOUNTER — Other Ambulatory Visit: Payer: Self-pay | Admitting: Podiatry

## 2017-12-04 DIAGNOSIS — M7661 Achilles tendinitis, right leg: Secondary | ICD-10-CM

## 2017-12-04 DIAGNOSIS — M722 Plantar fascial fibromatosis: Secondary | ICD-10-CM

## 2017-12-08 ENCOUNTER — Telehealth: Payer: Self-pay

## 2017-12-08 NOTE — Telephone Encounter (Signed)
Pt daughter contacted the office and stated pt has been drinking more and she is not taking the medicine that you prescribed for alcohol. That pt fell down the stairs 2-3 days ago because she was drunk. Pt daughter states that pt granddaughter is worried about her because she has been at the house with the pt and has notice more drinking. Pt daughter states she doesn't know what to do and she is worried about her mom and would like to know what can be done? Please f/u

## 2017-12-08 NOTE — Telephone Encounter (Signed)
I called pt's daughter and left message on VM.  I informed her that she can take her mother to hospital to get her sent to a detox program.  I will have our LCSW give a call on Monday.

## 2017-12-11 NOTE — Progress Notes (Signed)
   HPI: 52 year old female presents the office today for evaluation of a painful skin lesion on the posterior aspect of the right heel.  Patient had a retrocalcaneal exostectomy performed of the right posterior tubercle of the calcaneus approximate 5 years ago.  She states that she has had surgery 2 times in the past on this posterior heel.  She gradually developed a painful skin lesion that is very hard to palpation to the posterior heel.  She changes a significant amount of pain and tenderness without any alleviation of symptoms for the patient.  Past Medical History:  Diagnosis Date  . Asthma   . Bipolar 1 disorder (HCC)   . Chronic lumbar pain   . DDD (degenerative disc disease)   . Depression   . GERD (gastroesophageal reflux disease)   . History of gastric ulcer   . Hyperlipidemia   . Hypertension   . Migraine   . Osteoarthritis   . Right ACL tear   . Right knee meniscal tear   . Sciatica   . Wears glasses      Physical Exam: General: The patient is alert and oriented x3 in no acute distress.  Dermatology: Painful, hypertrophic skin nodule noted to the posterior aspect of the right heel along the surgical incision site.  No drainage noted.  Skin is warm, dry and supple bilateral lower extremities. Negative for open lesions or macerations.  Vascular: Palpable pedal pulses bilaterally. No edema or erythema noted. Capillary refill within normal limits.  Neurological: Epicritic and protective threshold grossly intact bilaterally.   Musculoskeletal Exam: Range of motion within normal limits to all pedal and ankle joints bilateral. Muscle strength 5/5 in all groups bilateral.  Significant amount of pain on palpation of the right posterior heel  Radiographic Exam:  Normal osseous mineralization. Joint spaces preserved. No fracture/dislocation/boney destruction.  There does appear to be an intact Achilles tendon anchor in bed into the posterior tubercle of the calcaneus.   Calcifications also noted along the insertion of the Achilles tendon.  Thickening of the Achilles tendon also noted.  Assessment: 1.  Achilles tendinitis right lower extremity 2.  History of retrocalcaneal exostectomy with repair of Achilles tendon right lower extremity 3.  Painful skin nodule right posterior heel   Plan of Care:  1. Patient evaluated. X-Rays reviewed.  2.  Today we are going to order an MRI of the right lower extremity to determine the severity and extent of the Achilles tendon.  This will aid in surgical planning and possible surgical intervention 3.  Return to clinic in 4 weeks for follow-up and to review MRI results      Felecia Shelling, DPM [facility]  Dr. Felecia Shelling, DPM    [address] [place], [place] [address]                Office [phone]  Fax [phone]

## 2017-12-12 ENCOUNTER — Telehealth: Payer: Self-pay | Admitting: Licensed Clinical Social Worker

## 2017-12-12 ENCOUNTER — Telehealth: Payer: Self-pay | Admitting: *Deleted

## 2017-12-12 DIAGNOSIS — M7661 Achilles tendinitis, right leg: Secondary | ICD-10-CM

## 2017-12-12 NOTE — Telephone Encounter (Signed)
Faxed orders to [facility] - Main Scheduling.

## 2017-12-12 NOTE — Telephone Encounter (Signed)
Call placed to pt's daughter, Jamie Wilson, to provide community resources, per request. No answer. LCSWA left message for return call.  

## 2017-12-12 NOTE — Telephone Encounter (Signed)
Call placed to pt's daughter, Jenkins Rouge, to provide community resources, per request. No answer. LCSWA left message for return call.

## 2017-12-12 NOTE — Telephone Encounter (Signed)
-----   Message from Brent MFelecia ShellingM sent at 12/11/2017  7:14 PM EDT ----- Regarding: MRI right posterior heel Please order MRI right posterior heel with or without contrast  Diagnosis: Hard skin lesion right posterior heel.  History of retrocalcaneal exostectomy with repair of Achilles tendon right.   Thanks, Dr. Logan Bores

## 2017-12-18 NOTE — Telephone Encounter (Signed)
LCSWA has made multiple attempts to contact family. Detailed messages were left requesting a return call.

## 2017-12-19 ENCOUNTER — Other Ambulatory Visit: Payer: Self-pay | Admitting: Podiatry

## 2017-12-19 ENCOUNTER — Ambulatory Visit (HOSPITAL_COMMUNITY)
Admission: RE | Admit: 2017-12-19 | Discharge: 2017-12-19 | Disposition: A | Discharge status: Home / Self Care | Payer: Medicaid Other | Source: Ambulatory Visit | Attending: Podiatry | Admitting: Podiatry

## 2017-12-19 DIAGNOSIS — M7661 Achilles tendinitis, right leg: Secondary | ICD-10-CM | POA: Insufficient documentation

## 2017-12-19 DIAGNOSIS — Z9889 Other specified postprocedural states: Secondary | ICD-10-CM | POA: Insufficient documentation

## 2017-12-19 MED ORDER — GADOBENATE DIMEGLUMINE 529 MG/ML IV SOLN: 15.0000 mL | Freq: Once | INTRAVENOUS | Status: DC | PRN

## 2018-01-11 ENCOUNTER — Ambulatory Visit: Payer: Medicaid Other | Admitting: Internal Medicine

## 2018-01-11 MED FILL — IBUPROFEN 800 MG TABLET: 800 | 20 days supply | Qty: 60 | Fill #1

## 2018-01-11 MED FILL — SERTRALINE HCL 50 MG TABS: 50 | 30 days supply | Qty: 30 | Fill #1

## 2018-01-11 MED FILL — hydrOXYzine HCL 25 MG TABS: 25 | 30 days supply | Qty: 30 | Fill #1

## 2018-01-11 MED FILL — OMEPRAZOLE DR 40 MG CAPSULE: 40 | 30 days supply | Qty: 30 | Fill #1

## 2018-01-11 MED FILL — LISINOPRIL-HCTZ 20-12.5 MG: 20-12.5 | 30 days supply | Qty: 30 | Fill #1

## 2018-01-17 ENCOUNTER — Other Ambulatory Visit: Payer: Self-pay | Admitting: Internal Medicine

## 2018-01-17 DIAGNOSIS — Z1231 Encounter for screening mammogram for malignant neoplasm of breast: Secondary | ICD-10-CM

## 2018-01-22 ENCOUNTER — Ambulatory Visit: Payer: Medicaid Other | Admitting: Internal Medicine

## 2018-01-29 ENCOUNTER — Ambulatory Visit: Payer: Medicaid Other | Admitting: Internal Medicine

## 2018-01-31 ENCOUNTER — Telehealth: Payer: Self-pay | Admitting: Podiatry

## 2018-01-31 NOTE — Telephone Encounter (Signed)
I told pt we had the MRI results and to schedule an appt, transferred to schedulers.

## 2018-01-31 NOTE — Telephone Encounter (Signed)
Pt wanted to be called about results from MRI

## 2018-02-12 ENCOUNTER — Ambulatory Visit: Payer: No Typology Code available for payment source | Admitting: Podiatry

## 2018-02-19 ENCOUNTER — Other Ambulatory Visit: Payer: Self-pay | Admitting: Internal Medicine

## 2018-02-19 DIAGNOSIS — F10982 Alcohol use, unspecified with alcohol-induced sleep disorder: Secondary | ICD-10-CM

## 2018-02-19 MED FILL — SERTRALINE HCL 50 MG TABS: 50 | 30 days supply | Qty: 30 | Fill #0

## 2018-02-19 MED FILL — IBUPROFEN 800 MG TABLET: 800 | 20 days supply | Qty: 60 | Fill #2

## 2018-02-19 MED FILL — ?LISINOPRIL-HCTZ 20/12.5 TA: 20-12.5 | 30 days supply | Qty: 30 | Fill #2

## 2018-02-19 MED FILL — hydrOXYzine HCL 25 MG TABS: 25 | 30 days supply | Qty: 30 | Fill #0

## 2018-02-19 MED FILL — OMEPRAZOLE DR 40 MG CAPSULE: 40 | 30 days supply | Qty: 30 | Fill #2

## 2018-02-26 ENCOUNTER — Ambulatory Visit: Payer: Medicaid Other | Attending: Internal Medicine

## 2018-03-05 ENCOUNTER — Ambulatory Visit: Payer: Self-pay | Admitting: Podiatry

## 2018-03-14 ENCOUNTER — Ambulatory Visit: Payer: Self-pay | Admitting: Podiatry

## 2018-03-26 ENCOUNTER — Ambulatory Visit (INDEPENDENT_AMBULATORY_CARE_PROVIDER_SITE_OTHER): Payer: Self-pay | Admitting: Podiatry

## 2018-03-26 DIAGNOSIS — L989 Disorder of the skin and subcutaneous tissue, unspecified: Secondary | ICD-10-CM

## 2018-03-26 DIAGNOSIS — M7661 Achilles tendinitis, right leg: Secondary | ICD-10-CM

## 2018-03-26 NOTE — Patient Instructions (Signed)
Pre-Operative Instructions  Congratulations, you have decided to take an important step towards improving your quality of life.  You can be assured that the doctors and staff at [facility] will be with you every step of the way.  Here are some important things you should know:  1. Plan to be at the surgery center/hospital at least 1 (one) hour prior to your scheduled time, unless otherwise directed by the surgical center/hospital staff.  You must have a responsible adult accompany you, remain during the surgery and drive you home.  Make sure you have directions to the surgical center/hospital to ensure you arrive on time. 2. If you are having surgery at [facility] or [facility] hospitals, you will need a copy of your medical history and physical form from your family physician within one month prior to the date of surgery. We will give you a form for your primary physician to complete.  3. We make every effort to accommodate the date you request for surgery.  However, there are times where surgery dates or times have to be moved.  We will contact you as soon as possible if a change in schedule is required.   4. No aspirin/ibuprofen for one week before surgery.  If you are on aspirin, any non-steroidal anti-inflammatory medications (Mobic, Aleve, Ibuprofen) should not be taken seven (7) days prior to your surgery.  You make take Tylenol for pain prior to surgery.  5. Medications - If you are taking daily heart and blood pressure medications, seizure, reflux, allergy, asthma, anxiety, pain or diabetes medications, make sure you notify the surgery center/hospital before the day of surgery so they can tell you which medications you should take or avoid the day of surgery. 6. No food or drink after midnight the night before surgery unless directed otherwise by surgical center/hospital staff. 7. No alcoholic beverages 24-hours prior to surgery.  No smoking 24-hours prior or 24-hours after  surgery. 8. Wear loose pants or shorts. They should be loose enough to fit over bandages, boots, and casts. 9. Don't wear slip-on shoes. Sneakers are preferred. 10. Bring your boot with you to the surgery center/hospital.  Also bring crutches or a walker if your physician has prescribed it for you.  If you do not have this equipment, it will be provided for you after surgery. 11. If you have not been contacted by the surgery center/hospital by the day before your surgery, call to confirm the date and time of your surgery. 12. Leave-time from work may vary depending on the type of surgery you have.  Appropriate arrangements should be made prior to surgery with your employer. 13. Prescriptions will be provided immediately following surgery by your doctor.  Fill these as soon as possible after surgery and take the medication as directed. Pain medications will not be refilled on weekends and must be approved by the doctor. 14. Remove nail polish on the operative foot and avoid getting pedicures prior to surgery. 15. Wash the night before surgery.  The night before surgery wash the foot and leg well with water and the antibacterial soap provided. Be sure to pay special attention to beneath the toenails and in between the toes.  Wash for at least three (3) minutes. Rinse thoroughly with water and dry well with a towel.  Perform this wash unless told not to do so by your physician.  Enclosed: 1 Ice pack (please put in freezer the night before surgery)   1 Hibiclens skin cleaner     Pre-op instructions  If you have any questions regarding the instructions, please do not hesitate to call our office.  [place]: [address], [place], [place] [address] -- [phone]  [place]: [address] [place], [place] [address] -- [phone]  [place]: [address]  [place], [place] [address] -- [phone]  [place]: [address], [address], [place], [place] [address] -- [phone]  Website:  [email or web address] 

## 2018-03-28 NOTE — Progress Notes (Signed)
   HPI: 52 year old female presenting today for follow up evaluation of achilles tendinitis of the right lower extremity. She reports she still has the same lesion present and it is bothersome. There are no modifying factors noted. Patient is here for further evaluation and treatment.   Past Medical History:  Diagnosis Date  . Asthma   . Bipolar 1 disorder (HCC)   . Chronic lumbar pain   . DDD (degenerative disc disease)   . Depression   . GERD (gastroesophageal reflux disease)   . History of gastric ulcer   . Hyperlipidemia   . Hypertension   . Migraine   . Osteoarthritis   . Right ACL tear   . Right knee meniscal tear   . Sciatica   . Wears glasses      Physical Exam: General: The patient is alert and oriented x3 in no acute distress.  Dermatology: Painful, hypertrophic skin nodule noted to the posterior aspect of the right heel along the surgical incision site.  No drainage noted.  Skin is warm, dry and supple bilateral lower extremities. Negative for open lesions or macerations.  Vascular: Palpable pedal pulses bilaterally. No edema or erythema noted. Capillary refill within normal limits.  Neurological: Epicritic and protective threshold intact.  Paresthesia with burning, shooting pain noted to the right sural nerve. Positive Tinel sign with percussion of the sural nerve of the right foot.    Musculoskeletal Exam: Range of motion within normal limits to all pedal and ankle joints bilateral. Muscle strength 5/5 in all groups bilateral.   MRI Impression:  Negative for evidence of neoplastic process.  No fluid collection.  Small focus of fibrotic tissue along the lateral margin of the heel likely accounts for the patient's palpated abnormality.  Status post calcaneal osteotomy and Achilles tendon repair without evidence of complication.   Assessment: 1.  Neuritis sural nerve right  2.  History of retrocalcaneal exostectomy with repair of Achilles tendon right lower  extremity 3.  Painful skin nodule right posterior heel   Plan of Care:  1. Patient evaluated. MRI reviewed.  2. Today we discussed the conservative versus surgical management of the presenting pathology. The patient opts for surgical management. All possible complications and details of the procedure were explained. All patient questions were answered. No guarantees were expressed or implied. 3. Authorization for surgery was initiated today. Surgery will consist of excision of soft tissue tumor of the right heel; neurolysis sural nerve right.  4. Return to clinic one week post op.       Felecia ShellingBrent M. Rachal Dvorsky, DPM [facility]  Dr. Felecia ShellingBrent M. Rylend Pietrzak, DPM    [address]                                        [place], [place] [address]                Office [phone]  Fax [phone]

## 2018-04-13 ENCOUNTER — Telehealth: Payer: Self-pay | Admitting: *Deleted

## 2018-04-13 NOTE — Telephone Encounter (Addendum)
I am calling to find out what insurance you have.  "I have the orange card."  We need to reschedule your surgery.  [facility] does not take the orange card.  Your surgery has to be done at [facility].  "I am going next week to renew my orange card.  I have to take them the information from the IRS.  My nephew is going to take me.  I understand if we have to cancel it.  I will call you when I get it renewed.  What do I need to do then?"  You need to get a history and physical form completed by your primary care physician.  So come by the office to get the forms and then we can get you scheduled at [facility].

## 2018-04-13 NOTE — Telephone Encounter (Signed)
"  I am scheduled to have surgery on Thursday of next week.  I am calling to see what time I will need to be there so I can make arrangements for a ride."  You need to call the surgical center.  "What's their phone number?"  It is [phone].  "Okay, have a great weekend."

## 2018-04-23 ENCOUNTER — Emergency Department (HOSPITAL_COMMUNITY): Payer: Medicaid Other

## 2018-04-23 ENCOUNTER — Emergency Department (HOSPITAL_COMMUNITY)
Admission: EM | Admit: 2018-04-23 | Discharge: 2018-04-23 | Disposition: A | Discharge status: Home / Self Care | Payer: Medicaid Other | Attending: Emergency Medicine | Admitting: Emergency Medicine

## 2018-04-23 ENCOUNTER — Encounter (HOSPITAL_COMMUNITY): Payer: Self-pay | Admitting: Emergency Medicine

## 2018-04-23 ENCOUNTER — Other Ambulatory Visit: Payer: Self-pay

## 2018-04-23 ENCOUNTER — Ambulatory Visit: Payer: Medicaid Other

## 2018-04-23 DIAGNOSIS — J40 Bronchitis, not specified as acute or chronic: Secondary | ICD-10-CM | POA: Insufficient documentation

## 2018-04-23 DIAGNOSIS — Z79899 Other long term (current) drug therapy: Secondary | ICD-10-CM | POA: Insufficient documentation

## 2018-04-23 DIAGNOSIS — F17210 Nicotine dependence, cigarettes, uncomplicated: Secondary | ICD-10-CM | POA: Insufficient documentation

## 2018-04-23 DIAGNOSIS — I10 Essential (primary) hypertension: Secondary | ICD-10-CM | POA: Insufficient documentation

## 2018-04-23 MED ORDER — BENZONATATE 100 MG PO CAPS: 100.0000 mg | ORAL_CAPSULE | Freq: Three times a day (TID) | ORAL | 0 refills | Status: DC

## 2018-04-23 MED ORDER — AZITHROMYCIN 250 MG PO TABS: 250.0000 mg | ORAL_TABLET | Freq: Every day | ORAL | 0 refills | Status: DC

## 2018-04-23 MED ORDER — NAPROXEN 375 MG PO TABS: 375.0000 mg | ORAL_TABLET | Freq: Two times a day (BID) | ORAL | 0 refills | Status: DC

## 2018-04-23 NOTE — ED Triage Notes (Signed)
Pt reports that she started having body aches, cough with green phlegm, congestion and upper back pain since Friday.

## 2018-04-23 NOTE — ED Provider Notes (Signed)
[facility]-EMERGENCY DEPT Provider Note   CSN: [id number] Arrival date & time: 04/23/18  1107     History   Chief Complaint Chief Complaint  Patient presents with  . Back Pain  . Cough  . Generalized Body Aches    HPI [name] [name] is a 52 y.o. female with hx of asthma and is an every day smoker who presents to the ED with body aches, cough with green sputum, congestion and upper back pain. Symptoms started 4 days ago.  The back pain increases with cough.  The history is provided by the patient. No language interpreter was used.  URI   This is a new problem. The current episode started more than 2 days ago. The problem has been gradually worsening. The maximum temperature recorded prior to her arrival was 100 to 100.9 F. Associated symptoms include congestion, sinus pain and cough. Pertinent negatives include no chest pain, no diarrhea, no nausea, no vomiting, no dysuria, no ear pain, no headaches, no sore throat and no rash.    Past Medical History:  Diagnosis Date  . Asthma   . Bipolar 1 disorder (HCC)   . Chronic lumbar pain   . DDD (degenerative disc disease)   . Depression   . GERD (gastroesophageal reflux disease)   . History of gastric ulcer   . Hyperlipidemia   . Hypertension   . Migraine   . Osteoarthritis   . Right ACL tear   . Right knee meniscal tear   . Sciatica   . Wears glasses     Patient Active Problem List   Diagnosis Date Noted  . Alcohol use disorder, severe, dependence (HCC) 11/23/2017  . Chronic midline low back pain without sciatica 11/23/2017  . Essential hypertension 11/23/2017  . Chronic pain of right knee 11/23/2017  . 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 Marijuana abuse 11/23/2017  . Tobacco dependence 11/23/2017  . Airway problem   . 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 Hypokalemia 07/30/2016  . Dehydration 07/30/2016  . Alcohol abuse 07/30/2016  . S/P ACL reconstruction 01/31/2014  . DEPRESSION  11/17/2008  . ACUTE SINUSITIS, UNSPECIFIED 11/17/2008  . ALLERGIC RHINITIS 11/17/2008  . GERD 11/17/2008  . HEADACHE 11/17/2008    Past Surgical History:  Procedure Laterality Date  . ABDOMINAL HYSTERECTOMY    . BUNIONECTOMY Left 2011  . HEEL SPUR SURGERY Right 2012  . KNEE ARTHROSCOPY WITH ANTERIOR CRUCIATE LIGAMENT (ACL) REPAIR WITH HAMSTRING GRAFT Right 01/31/2014   Procedure: RIGHT KNEE ARTHROSCOPY WITH ALLOGRAFT (ACL) ANTERIOR CRUCIATE LIGAMENT RECONSTRUCTON PARTIAL MENISCECTOMY VERSES REPAIR ;  Surgeon: Eugenia Mcalpine, MD;  Location: [facility];  Service: Orthopedics;  Laterality: Right;  . TOTAL ABDOMINAL HYSTERECTOMY W/ BILATERAL SALPINGOOPHORECTOMY  04-20-2006     OB History   None      Home Medications    Prior to Admission medications   Medication Sig Start Date End Date Taking? Authorizing Provider  azithromycin (ZITHROMAX) 250 MG tablet Take 1 tablet (250 mg total) by mouth daily. Take first 2 tablets together, then 1 every day until finished. 04/23/18   Janne Napoleon, NP  benzonatate (TESSALON) 100 MG capsule Take 1 capsule (100 mg total) by mouth every 8 (eight) hours. 04/23/18   Janne Napoleon, NP  calcium carbonate (TUMS EX) 750 MG chewable tablet Chew 1 tablet by mouth 2 (two) times daily as needed for heartburn.    [provider]  hydrOXYzine (ATARAX/VISTARIL) 25 MG tablet TAKE 1 TABLET BY MOUTH  AT BEDTIME AS NEEDED. 02/19/18   Marcine MatarJohnson, Deborah B, MD  lisinopril-hydrochlorothiazide (PRINZIDE,ZESTORETIC) 20-12.5 MG tablet Take 1 tablet by mouth daily. 11/23/17   Marcine MatarJohnson, Deborah B, MD  naproxen (NAPROSYN) 375 MG tablet Take 1 tablet (375 mg total) by mouth 2 (two) times daily. 04/23/18   Janne NapoleonNeese, Hope M, NP  omeprazole (PRILOSEC) 40 MG capsule Take 1 capsule (40 mg total) by mouth daily. 11/23/17   Marcine MatarJohnson, Deborah B, MD  sertraline (ZOLOFT) 50 MG tablet Take 1 tablet (50 mg total) by mouth daily. 02/19/18   Marcine MatarJohnson, Deborah B, MD  thiamine (VITAMIN  B-1) 100 MG tablet Take 1 tablet (100 mg total) by mouth daily. 11/23/17   Marcine MatarJohnson, Deborah B, MD    Family History Family History  Problem Relation Age of Onset  . Hypertension Father     Social History Social History   Tobacco Use  . Smoking status: Current Every Day Smoker    Packs/day: 0.25    Years: 32.00    Pack years: 8.00    Types: Cigarettes  . Smokeless tobacco: Never Used  Substance Use Topics  . Alcohol use: Yes    Alcohol/week: 14.0 standard drinks    Types: 14 Cans of beer per week    Comment: 4 40 ounce beers and liquor  . Drug use: Yes    Types: Marijuana    Comment: occasional  marijuana--  hx  "crack" use  last used 2004--  no treatment rehab (done on her own)     Allergies   Diphenhydramine hcl and Metoprolol   Review of Systems Review of Systems  Constitutional: Positive for chills. Fever: low grade.  HENT: Positive for congestion, sinus pressure and sinus pain. Negative for ear pain, sore throat and trouble swallowing.   Eyes: Negative for redness and visual disturbance.  Respiratory: Positive for cough. Negative for shortness of breath.   Cardiovascular: Negative for chest pain.  Gastrointestinal: Negative for diarrhea, nausea and vomiting.  Genitourinary: Negative for dysuria, frequency and urgency.  Musculoskeletal: Positive for back pain.  Skin: Negative for rash.  Neurological: Negative for syncope and headaches.  Psychiatric/Behavioral: Negative for confusion.     Physical Exam Updated Vital Signs BP (!) 164/100 (BP Location: Left Arm)   Pulse 78   Temp 99.1 F (37.3 C) (Oral)   Resp 18   SpO2 100%   Physical Exam  Constitutional: She appears well-developed and well-nourished. No distress.  HENT:  Head: Normocephalic and atraumatic.  Right Ear: Tympanic membrane normal.  Left Ear: Tympanic membrane normal.  Nose: Mucosal edema and rhinorrhea present. Right sinus exhibits maxillary sinus tenderness. Left sinus exhibits  maxillary sinus tenderness.  Mouth/Throat: Oropharynx is clear and moist.  Eyes: EOM are normal.  Neck: Neck supple.  Cardiovascular: Normal rate and regular rhythm.  Pulmonary/Chest: Effort normal. She has no wheezes. She has no rales.  Abdominal: Soft. There is no tenderness.  Musculoskeletal: Normal range of motion.  Neurological: She is alert.  Skin: Skin is warm and dry.  Psychiatric: She has a normal mood and affect. Her behavior is normal.  Nursing note and vitals reviewed.    ED Treatments / Results  Labs (all labs ordered are listed, but only abnormal results are displayed) Labs Reviewed - No data to display  Radiology Dg Chest 2 View  Result Date: 04/23/2018 CLINICAL DATA:  Body aches, cough, congestion EXAM: CHEST - 2 VIEW COMPARISON:  10/14/2013 FINDINGS: Heart is normal size. Lungs clear. No effusions or edema. No acute  bony abnormality. IMPRESSION: No active cardiopulmonary disease. Electronically Signed   By: Charlett Nose M.D.   On: 04/23/2018 11:49    Procedures Procedures (including critical care time)  Medications Ordered in ED Medications - No data to display   Initial Impression / Assessment and Plan / ED Course  I have reviewed the triage vital signs and the nursing notes. Pt CXR negative for acute infiltrate. Patients symptoms are consistent with bronchitis. Discussed with the patient that with her hx of asthma and every day smoker she is at higher risk for infection so will start Z-pak, cough medication. She will take NSAIDS for her back pain. Patient verbalizes understanding and is agreeable with plan. Pt is hemodynamically stable & in NAD prior to dc.  Final Clinical Impressions(s) / ED Diagnoses   Final diagnoses:  Bronchitis    ED Discharge Orders         Ordered    azithromycin (ZITHROMAX) 250 MG tablet  Daily     04/23/18 1617    benzonatate (TESSALON) 100 MG capsule  Every 8 hours     04/23/18 1617    naproxen (NAPROSYN) 375 MG tablet  2  times daily     04/23/18 [address] [place], [place] 04/23/18 1628    Mancel Bale, MD 04/23/18 1630

## 2018-04-30 ENCOUNTER — Ambulatory Visit: Payer: Self-pay | Attending: Internal Medicine

## 2018-04-30 MED FILL — LISINOPRIL-HCTZ 20-12.5 MG: 20-12.5 | 30 days supply | Qty: 30 | Fill #3

## 2018-04-30 MED FILL — OMEPRAZOLE DR 40 MG CAPSULE: 40 | 30 days supply | Qty: 30 | Fill #3

## 2018-05-22 ENCOUNTER — Telehealth: Payer: Self-pay | Admitting: Internal Medicine

## 2018-05-22 ENCOUNTER — Ambulatory Visit: Payer: Self-pay | Attending: Internal Medicine

## 2018-05-22 NOTE — Telephone Encounter (Signed)
Pt came to office to try to apply for the CAFA and OC, I try to informed her that we are missing the notari  Letter, she got upset and told me why I did not tell her, I inform her that she left mad and she don't let me talk again that is why I did not told her everything she need to bring for the appointment, she still got mad and left she, again we did not finish the conversation and she walk out,

## 2018-07-12 ENCOUNTER — Ambulatory Visit: Payer: Medicaid Other

## 2018-07-19 ENCOUNTER — Ambulatory Visit: Payer: Self-pay | Attending: Internal Medicine | Admitting: Physician Assistant

## 2018-07-19 VITALS — BP 159/93 | HR 98 | Temp 98.0°F | Ht 63.0 in | Wt 149.0 lb

## 2018-07-19 DIAGNOSIS — K219 Gastro-esophageal reflux disease without esophagitis: Secondary | ICD-10-CM | POA: Insufficient documentation

## 2018-07-19 DIAGNOSIS — J45909 Unspecified asthma, uncomplicated: Secondary | ICD-10-CM | POA: Insufficient documentation

## 2018-07-19 DIAGNOSIS — R05 Cough: Secondary | ICD-10-CM | POA: Insufficient documentation

## 2018-07-19 DIAGNOSIS — F10982 Alcohol use, unspecified with alcohol-induced sleep disorder: Secondary | ICD-10-CM

## 2018-07-19 DIAGNOSIS — Z8719 Personal history of other diseases of the digestive system: Secondary | ICD-10-CM | POA: Insufficient documentation

## 2018-07-19 DIAGNOSIS — Z79899 Other long term (current) drug therapy: Secondary | ICD-10-CM | POA: Insufficient documentation

## 2018-07-19 DIAGNOSIS — K047 Periapical abscess without sinus: Secondary | ICD-10-CM

## 2018-07-19 DIAGNOSIS — F319 Bipolar disorder, unspecified: Secondary | ICD-10-CM | POA: Insufficient documentation

## 2018-07-19 DIAGNOSIS — R059 Cough, unspecified: Secondary | ICD-10-CM

## 2018-07-19 DIAGNOSIS — E785 Hyperlipidemia, unspecified: Secondary | ICD-10-CM | POA: Insufficient documentation

## 2018-07-19 DIAGNOSIS — I10 Essential (primary) hypertension: Secondary | ICD-10-CM

## 2018-07-19 DIAGNOSIS — Z791 Long term (current) use of non-steroidal anti-inflammatories (NSAID): Secondary | ICD-10-CM | POA: Insufficient documentation

## 2018-07-19 DIAGNOSIS — R51 Headache: Secondary | ICD-10-CM

## 2018-07-19 DIAGNOSIS — M79604 Pain in right leg: Secondary | ICD-10-CM

## 2018-07-19 MED ORDER — AMOXICILLIN 500 MG PO CAPS: 500.0000 mg | ORAL_CAPSULE | Freq: Three times a day (TID) | ORAL | 0 refills | Status: DC

## 2018-07-19 MED ORDER — SERTRALINE HCL 50 MG PO TABS: 50.0000 mg | ORAL_TABLET | Freq: Every day | ORAL | 1 refills | Status: DC

## 2018-07-19 MED ORDER — AMLODIPINE BESYLATE 10 MG PO TABS: 10.0000 mg | ORAL_TABLET | Freq: Every day | ORAL | 3 refills | Status: DC

## 2018-07-19 MED ORDER — BENZONATATE 100 MG PO CAPS: 100.0000 mg | ORAL_CAPSULE | Freq: Three times a day (TID) | ORAL | 0 refills | Status: DC

## 2018-07-19 MED ORDER — METHOCARBAMOL 500 MG PO TABS: 500.0000 mg | ORAL_TABLET | Freq: Three times a day (TID) | ORAL | 0 refills | Status: DC | PRN

## 2018-07-19 MED ORDER — HYDROXYZINE HCL 25 MG PO TABS: 25.0000 mg | ORAL_TABLET | Freq: Every evening | ORAL | 1 refills | Status: DC | PRN

## 2018-07-19 MED ORDER — NAPROXEN 500 MG PO TABS: 500.0000 mg | ORAL_TABLET | Freq: Two times a day (BID) | ORAL | 0 refills | Status: DC

## 2018-07-19 MED FILL — METHOCARBAMOL 500 MG TABS: 500 | 30 days supply | Qty: 90 | Fill #0

## 2018-07-19 MED FILL — BENZONATATE 100 MG CAP: 100 | 7 days supply | Qty: 21 | Fill #0

## 2018-07-19 MED FILL — OMEPRAZOLE DR 40 MG CAPSULE: 40 | 30 days supply | Qty: 30 | Fill #3

## 2018-07-19 MED FILL — AMOXICILLIN 500 MG CAPSULE: 500 | 10 days supply | Qty: 30 | Fill #0

## 2018-07-19 MED FILL — SERTRALINE HCL 50 MG TABS: 50 | 30 days supply | Qty: 30 | Fill #0

## 2018-07-19 MED FILL — hydrOXYzine HCL 25 MG TABS: 25 | 30 days supply | Qty: 30 | Fill #0

## 2018-07-19 MED FILL — AMLODIPINE BESYLATE 10 MG T: 10 | 30 days supply | Qty: 30 | Fill #0

## 2018-07-19 MED FILL — NAPROXEN 500 MG TABLET: 500 | 30 days supply | Qty: 60 | Fill #0

## 2018-07-19 NOTE — Patient Instructions (Signed)
Check blood pressure out of the office 3-5 times/ week and record and bring to next visit 

## 2018-07-19 NOTE — Progress Notes (Signed)
Patient ID: SUSA BONES, female   DOB: 1965-12-27, 52 y.o.   MRN: [id number]    [name] [name], is a 52 y.o. female  NWG:[id number]  [id number]  DOB - 05-23-1966  Subjective:  Chief Complaint and HPI: [name] [name] is a 52 y.o. female here today  With multiple issues and needs RF.  Not taking lisinopril bc she says it makes her feel bad.  C/o HA for years w/o vision changes.  No dizziness.    R leg pain for several months.  No back/leg injury.  No weakness.  No paresthesias.  Also painful abscess R lower mouth.  +low grade fever this week.   Social:  Has BF, 5 grandchildren she keeps a lot.    ROS:   Constitutional:  No f/c, No night sweats, No unexplained weight loss. EENT:  No vision changes, No blurry vision, No hearing changes.  Respiratory: No cough, No SOB Cardiac: No CP, no palpitations GI:  No abd pain, No N/V/D. GU: No Urinary s/sx Musculoskeletal: + back pain Neuro: + headache, no dizziness, no motor weakness.  Skin: No rash Endocrine:  No polydipsia. No polyuria.  Psych: Denies SI/HI  No problems updated.  ALLERGIES: Allergies  Allergen Reactions  . Diphenhydramine Hcl Itching  . Metoprolol Palpitations    PAST MEDICAL HISTORY: Past Medical History:  Diagnosis Date  . Asthma   . Bipolar 1 disorder (HCC)   . Chronic lumbar pain   . DDD (degenerative disc disease)   . Depression   . GERD (gastroesophageal reflux disease)   . History of gastric ulcer   . Hyperlipidemia   . Hypertension   . Migraine   . Osteoarthritis   . Right ACL tear   . Right knee meniscal tear   . Sciatica   . Wears glasses     MEDICATIONS AT HOME: Prior to Admission medications   Medication Sig Start Date End Date Taking? Authorizing Provider  amLODipine (NORVASC) 10 MG tablet Take 1 tablet (10 mg total) by mouth daily. 07/19/18   Anders Simmonds, PA-C  amoxicillin (AMOXIL) 500 MG capsule Take 1 capsule (500 mg total) by mouth 3 (three) times daily. 07/19/18    Anders Simmonds, PA-C  benzonatate (TESSALON) 100 MG capsule Take 1 capsule (100 mg total) by mouth every 8 (eight) hours. 07/19/18   Anders Simmonds, PA-C  calcium carbonate (TUMS EX) 750 MG chewable tablet Chew 1 tablet by mouth 2 (two) times daily as needed for heartburn.    [provider]  hydrOXYzine (ATARAX/VISTARIL) 25 MG tablet Take 1 tablet (25 mg total) by mouth at bedtime as needed. 07/19/18   Anders Simmonds, PA-C  methocarbamol (ROBAXIN) 500 MG tablet Take 1 tablet (500 mg total) by mouth every 8 (eight) hours as needed for muscle spasms. 07/19/18   Anders Simmonds, PA-C  naproxen (NAPROSYN) 500 MG tablet Take 1 tablet (500 mg total) by mouth 2 (two) times daily with a meal. Prn pain 07/19/18   Anders Simmonds, PA-C  omeprazole (PRILOSEC) 40 MG capsule Take 1 capsule (40 mg total) by mouth daily. 11/23/17   Marcine Matar, MD  sertraline (ZOLOFT) 50 MG tablet Take 1 tablet (50 mg total) by mouth daily. 07/19/18   Anders Simmonds, PA-C  thiamine (VITAMIN B-1) 100 MG tablet Take 1 tablet (100 mg total) by mouth daily. 11/23/17   Marcine Matar, MD     Objective:  [name]:   Vitals:   07/19/18 1353  BP: (!) 159/93  Pulse: 98  Temp: 98 F (36.7 C)  TempSrc: Oral  SpO2: 98%  Weight: 149 lb (67.6 kg)  Height: 5\' 3"  (1.6 m)    General appearance : A&OX3. NAD. Non-toxic-appearing HEENT: Atraumatic and Normocephalic.  PERRLA. EOM intact.  TM clear B.  Poor dentition, small abscess R lower by molar.   Mouth-MMM, post pharynx WNL w/o erythema, No PND. Neck: supple, no JVD. No cervical lymphadenopathy. No thyromegaly Chest/Lungs:  Breathing-non-labored, Good air entry bilaterally, breath sounds normal without rales, rhonchi, or wheezing  CVS: S1 S2 regular, no murmurs, gallops, rubs  Back:  Full S&ROM.  Neg SLR B.  DTR= intact Extremities: Bilateral Lower Ext shows no edema, both legs are warm to touch with = pulse throughout Neurology:  CN II-XII grossly  intact, Non focal.   Psych:  TP linear. J/I WNL. Normal speech. Appropriate eye contact and affect.  Skin:  No Rash  Data Review Lab Results  Component Value Date   HGBA1C 5.7 (H) 11/23/2017   HGBA1C 5.4 07/31/2016     Assessment & Plan   1. Tooth abscess - amoxicillin (AMOXIL) 500 MG capsule; Take 1 capsule (500 mg total) by mouth 3 (three) times daily.  Dispense: 30 capsule; Refill: 0 - Ambulatory referral to Dentistry  2. Alcohol-induced insomnia (HCC) - hydrOXYzine (ATARAX/VISTARIL) 25 MG tablet; Take 1 tablet (25 mg total) by mouth at bedtime as needed.  Dispense: 30 tablet; Refill: 1  3. Essential hypertension Likely causing HA Uncontrolled and wont' take lisinopril HCT.  Stop lisinopril/HCT.  Start- - amLODipine (NORVASC) 10 MG tablet; Take 1 tablet (10 mg total) by mouth daily.  Dispense: 90 tablet; Refill: 3 Check BP OOO 3-5 times/weeks and record and bring to next visit  4. Right leg pain - methocarbamol (ROBAXIN) 500 MG tablet; Take 1 tablet (500 mg total) by mouth every 8 (eight) hours as needed for muscle spasms.  Dispense: 90 tablet; Refill: 0 - naproxen (NAPROSYN) 500 MG tablet; Take 1 tablet (500 mg total) by mouth 2 (two) times daily with a meal. Prn pain  Dispense: 60 tablet; Refill: 0  5. Cough - benzonatate (TESSALON) 100 MG capsule; Take 1 capsule (100 mg total) by mouth every 8 (eight) hours.  Dispense: 21 capsule; Refill: 0  Patient have been counseled extensively about nutrition and exercise  Return in about 1 month (around 08/19/2018) for Dr Laural BenesJohnson for recheck BP, leg pain.  The patient was given clear instructions to go to ER or return to medical center if symptoms don't improve, worsen or new problems develop. The patient verbalized understanding. The patient was told to call to get lab results if they haven't heard anything in the next week.     Georgian CoAngela Ivylynn Hoppes, PA-C [facility] [place], [place] [phone]     07/19/2018, 2:05 PM

## 2018-08-21 ENCOUNTER — Ambulatory Visit: Payer: Medicaid Other | Admitting: Critical Care Medicine

## 2018-08-23 ENCOUNTER — Ambulatory Visit: Payer: Self-pay | Attending: Critical Care Medicine | Admitting: Physician Assistant

## 2018-08-23 ENCOUNTER — Other Ambulatory Visit: Payer: Self-pay | Admitting: Physician Assistant

## 2018-08-23 VITALS — BP 128/79 | HR 77 | Temp 98.1°F | Ht 63.0 in | Wt 151.6 lb

## 2018-08-23 DIAGNOSIS — G5601 Carpal tunnel syndrome, right upper limb: Secondary | ICD-10-CM

## 2018-08-23 DIAGNOSIS — M79604 Pain in right leg: Secondary | ICD-10-CM

## 2018-08-23 DIAGNOSIS — M6740 Ganglion, unspecified site: Secondary | ICD-10-CM

## 2018-08-23 DIAGNOSIS — R05 Cough: Secondary | ICD-10-CM

## 2018-08-23 DIAGNOSIS — R059 Cough, unspecified: Secondary | ICD-10-CM

## 2018-08-23 DIAGNOSIS — F1010 Alcohol abuse, uncomplicated: Secondary | ICD-10-CM

## 2018-08-23 MED ORDER — NAPROXEN 500 MG PO TABS: 500.0000 mg | ORAL_TABLET | Freq: Two times a day (BID) | ORAL | 0 refills | Status: DC

## 2018-08-23 MED ORDER — DICLOFENAC SODIUM 1 % TD GEL: 2.0000 g | Freq: Four times a day (QID) | TRANSDERMAL | 1 refills | Status: DC

## 2018-08-23 MED ORDER — PROMETHAZINE-DM 6.25-15 MG/5ML PO SYRP: 5.0000 mL | ORAL_SOLUTION | Freq: Four times a day (QID) | ORAL | 0 refills | Status: DC | PRN

## 2018-08-23 MED FILL — AMLODIPINE BESYLATE 10 MG T: 10 | 30 days supply | Qty: 30 | Fill #1

## 2018-08-23 MED FILL — SERTRALINE HCL 50 MG TABS: 50 | 30 days supply | Qty: 30 | Fill #1

## 2018-08-23 MED FILL — NAPROXEN 500 MG TABLET: 500 | 30 days supply | Qty: 60 | Fill #0

## 2018-08-23 MED FILL — DICLOFENAC SODIUM 1% GEL: 1 | 12 days supply | Qty: 100 | Fill #0

## 2018-08-23 MED FILL — hydrOXYzine HCL 25 MG TABS: 25 | 30 days supply | Qty: 30 | Fill #1

## 2018-08-23 MED FILL — PROMETHAZINE W/DM SYRUP: 6.25-15 | 6 days supply | Qty: 118 | Fill #0

## 2018-08-23 NOTE — Progress Notes (Signed)
Patient states that Zoloft makes her head hurt.

## 2018-08-23 NOTE — Progress Notes (Signed)
Patient ID: [name] [name], female   DOB: 1966/03/06, 53 y.o.   MRN: [id number]   [name] NickStephanie [name], is a 53 y.o. female  [id number]  [id number]  DOB - 1966/03/06  Subjective:  Chief Complaint and HPI: [name] [name] is a 53 y.o. female here today with R wrist pain X several months.  Does a lot of dishwashing.  Worse at night. +paresthesias and pain awaken her.  She has borrowed a wrist splint from her aunt that seems to be helping some.   Also has a "lump" on the inside of her R wrist for "a while."  Also, c/o chronic cough-worse at night.  It is intermittent and doesn't bother her every night.    She quit drinking alcohol about 1 month ago.    ROS:   Constitutional:  No f/c, No night sweats, No unexplained weight loss. EENT:  No vision changes, No blurry vision, No hearing changes. No mouth, throat, or ear problems.  Respiratory: No cough, No SOB Cardiac: No CP, no palpitations GI:  No abd pain, No N/V/D. GU: No Urinary s/sx Musculoskeletal: R wrist Neuro: No headache, no dizziness, no motor weakness.  Skin: No rash Endocrine:  No polydipsia. No polyuria.  Psych: Denies SI/HI  No problems updated.  ALLERGIES: Allergies  Allergen Reactions  . Diphenhydramine Hcl Itching  . Metoprolol Palpitations    PAST MEDICAL HISTORY: Past Medical History:  Diagnosis Date  . Asthma   . Bipolar 1 disorder (HCC)   . Chronic lumbar pain   . DDD (degenerative disc disease)   . Depression   . GERD (gastroesophageal reflux disease)   . History of gastric ulcer   . Hyperlipidemia   . Hypertension   . Migraine   . Osteoarthritis   . Right ACL tear   . Right knee meniscal tear   . Sciatica   . Wears glasses     MEDICATIONS AT HOME: Prior to Admission medications   Medication Sig Start Date End Date Taking? Authorizing Provider  amLODipine (NORVASC) 10 MG tablet Take 1 tablet (10 mg total) by mouth daily. 07/19/18  Yes Georgian CoMcClung, Emry Barbato M, PA-C  calcium carbonate (TUMS  EX) 750 MG chewable tablet Chew 1 tablet by mouth 2 (two) times daily as needed for heartburn.   Yes [provider]  hydrOXYzine (ATARAX/VISTARIL) 25 MG tablet Take 1 tablet (25 mg total) by mouth at bedtime as needed. 07/19/18  Yes Anders SimmondsMcClung, Traeger Sultana M, PA-C  methocarbamol (ROBAXIN) 500 MG tablet Take 1 tablet (500 mg total) by mouth every 8 (eight) hours as needed for muscle spasms. 07/19/18  Yes Georgian CoMcClung, Lewi Drost M, PA-C  naproxen (NAPROSYN) 500 MG tablet Take 1 tablet (500 mg total) by mouth 2 (two) times daily with a meal. Prn pain 08/23/18  Yes Hendry Speas M, PA-C  omeprazole (PRILOSEC) 40 MG capsule Take 1 capsule (40 mg total) by mouth daily. 11/23/17  Yes Marcine MatarJohnson, Deborah B, MD  sertraline (ZOLOFT) 50 MG tablet Take 1 tablet (50 mg total) by mouth daily. 07/19/18  Yes Georgian CoMcClung, Pati Thinnes M, PA-C  thiamine (VITAMIN B-1) 100 MG tablet Take 1 tablet (100 mg total) by mouth daily. 11/23/17  Yes Marcine MatarJohnson, Deborah B, MD  diclofenac sodium (VOLTAREN) 1 % GEL Apply 2 g topically 4 (four) times daily. 08/23/18   Anders SimmondsMcClung, Aaronjames Kelsay M, PA-C  promethazine-dextromethorphan (PROMETHAZINE-DM) 6.25-15 MG/5ML syrup Take 5 mLs by mouth 4 (four) times daily as needed for cough. 08/23/18   Anders SimmondsMcClung, Park Beck M, PA-C     Objective:  EXAM:   Vitals:   08/23/18 1044  BP: 128/79  Pulse: 77  Temp: 98.1 F (36.7 C)  TempSrc: Oral  SpO2: 100%  Weight: 151 lb 9.6 oz (68.8 kg)  Height: 5\' 3"  (1.6 m)    General appearance : A&OX3. NAD. Non-toxic-appearing HEENT: Atraumatic and Normocephalic.  PERRLA. EOM intact.  Chest/Lungs:  Breathing-non-labored, Good air entry bilaterally, breath sounds normal without rales, rhonchi, or wheezing  CVS: S1 S2 regular, no murmurs, gallops, rubs  R wrist-+1cm soft and mobile ganglion cyst inside/volar radial aspect.  +phalen's, neg tinel's.  UEDTR=intact B.   Extremities: Bilateral Lower Ext shows no edema, both legs are warm to touch with = pulse throughout Neurology:  CN  II-XII grossly intact, Non focal.   Psych:  TP linear. J/I WNL. Normal speech. Appropriate eye contact and affect.  Skin:  No Rash  Data Review Lab Results  Component Value Date   HGBA1C 5.7 (H) 11/23/2017   HGBA1C 5.4 07/31/2016     Assessment & Plan   1. Carpal tunnel syndrome of right wrist -obtain more sturdy wrist splint.  Sleep in them and wear most of day when able - naproxen (NAPROSYN) 500 MG tablet; Take 1 tablet (500 mg total) by mouth 2 (two) times daily with a meal. Prn pain  Dispense: 60 tablet; Refill: 0 - diclofenac sodium (VOLTAREN) 1 % GEL; Apply 2 g topically 4 (four) times daily.  Dispense: 100 g; Refill: 1 - Ambulatory referral to Hand Surgery  2. Ganglion cyst - naproxen (NAPROSYN) 500 MG tablet; Take 1 tablet (500 mg total) by mouth 2 (two) times daily with a meal. Prn pain  Dispense: 60 tablet; Refill: 0 - diclofenac sodium (VOLTAREN) 1 % GEL; Apply 2 g topically 4 (four) times daily.  Dispense: 100 g; Refill: 1 - Ambulatory referral to Hand Surgery  3. Cough - promethazine-dextromethorphan (PROMETHAZINE-DM) 6.25-15 MG/5ML syrup; Take 5 mLs by mouth 4 (four) times daily as needed for cough.  Dispense: 118 mL; Refill: 0  4. Alcohol abuse In remission.  I have counseled the patient at length about substance abuse and addiction.  12 step meetings/recovery recommended.  Local 12 step meeting lists were given and attendance was encouraged.  Patient expresses understanding.       Patient have been counseled extensively about nutrition and exercise  Return for 2/21 appt with Dr Laural Benes.  The patient was given clear instructions to go to ER or return to medical center if symptoms don't improve, worsen or new problems develop. The patient verbalized understanding. The patient was told to call to get lab results if they haven't heard anything in the next week.     Georgian Co, PA-C [facility] and [facility] [place],  [place] [phone]   08/23/2018, 11:00 AM

## 2018-08-23 NOTE — Patient Instructions (Signed)
Carpal Tunnel Syndrome  Carpal tunnel syndrome is a condition that causes pain in your hand and arm. The carpal tunnel is a narrow area that is on the palm side of your wrist. Repeated wrist motion or certain diseases may cause swelling in the tunnel. This swelling can pinch the main nerve in the wrist (median nerve). What are the causes? This condition may be caused by:  Repeated wrist motions.  Wrist injuries.  Arthritis.  A sac of fluid (cyst) or abnormal growth (tumor) in the carpal tunnel.  Fluid buildup during pregnancy. Sometimes the cause is not known. What increases the risk? The following factors may make you more likely to develop this condition:  Having a job in which you move your wrist in the same way many times. This includes jobs like being a butcher or a cashier.  Being a woman.  Having other health conditions, such as: ? Diabetes. ? Obesity. ? A thyroid gland that is not active enough (hypothyroidism). ? Kidney failure. What are the signs or symptoms? Symptoms of this condition include:  A tingling feeling in your fingers.  Tingling or a loss of feeling (numbness) in your hand.  Pain in your entire arm. This pain may get worse when you bend your wrist and elbow for a long time.  Pain in your wrist that goes up your arm to your shoulder.  Pain that goes down into your palm or fingers.  A weak feeling in your hands. You may find it hard to grab and hold items. You may feel worse at night. How is this diagnosed? This condition is diagnosed with a medical history and physical exam. You may also have tests, such as:  Electromyogram (EMG). This test checks the signals that the nerves send to the muscles.  Nerve conduction study. This test checks how well signals pass through your nerves.  Imaging tests, such as X-rays, ultrasound, and MRI. These tests check for what might be the cause of your condition. How is this treated? This condition may be treated  with:  Lifestyle changes. You will be asked to stop or change the activity that caused your problem.  Doing exercise and activities that make bones and muscles stronger (physical therapy).  Learning how to use your hand again (occupational therapy).  Medicines for pain and swelling (inflammation). You may have injections in your wrist.  A wrist splint.  Surgery. Follow these instructions at home: If you have a splint:  Wear the splint as told by your doctor. Remove it only as told by your doctor.  Loosen the splint if your fingers: ? Tingle. ? Lose feeling (become numb). ? Turn cold and blue.  Keep the splint clean.  If the splint is not waterproof: ? Do not let it get wet. ? Cover it with a watertight covering when you take a bath or a shower. Managing pain, stiffness, and swelling   If told, put ice on the painful area: ? If you have a removable splint, remove it as told by your doctor. ? Put ice in a plastic bag. ? Place a towel between your skin and the bag. ? Leave the ice on for 20 minutes, 2-3 times per day. General instructions  Take over-the-counter and prescription medicines only as told by your doctor.  Rest your wrist from any activity that may cause pain. If needed, talk with your boss at work about changes that can help your wrist heal.  Do any exercises as told by your doctor,   ice on for 20 minutes, 2-3 times per day.  General instructions   Take over-the-counter and prescription medicines only as told by your doctor.   Rest your wrist from any activity that may cause pain. If needed, talk with your boss at work about changes that can help your wrist heal.   Do any exercises as told by your doctor, physical therapist, or occupational therapist.   Keep all follow-up visits as told by your doctor. This is important.  Contact a doctor if:   You have new symptoms.   Medicine does not help your pain.   Your symptoms get worse.  Get help right away if:   You have very bad numbness or tingling in your wrist or hand.  Summary   Carpal tunnel syndrome is a condition that causes pain in your hand and arm.   It is often caused by repeated wrist motions.   Lifestyle changes and medicines are used to treat this problem. Surgery may help in very bad cases.   Follow your doctor's  instructions about wearing a splint, resting your wrist, keeping follow-up visits, and calling for help.  This information is not intended to replace advice given to you by your health care provider. Make sure you discuss any questions you have with your health care provider.  Document Released: 07/14/2011 Document Revised: 12/01/2017 Document Reviewed: 12/01/2017  Elsevier Interactive Patient Education  2019 Elsevier Inc.      Ganglion Cyst    A ganglion cyst is a non-cancerous, fluid-filled lump that occurs near a joint or tendon. The cyst grows out of a joint or the lining of a tendon. Ganglion cysts most often develop in the hand or wrist, but they can also develop in the shoulder, elbow, hip, knee, ankle, or foot.  Ganglion cysts are ball-shaped or egg-shaped. Their size can range from the size of a pea to larger than a grape. Increased activity may cause the cyst to get bigger because more fluid starts to build up.  What are the causes?  The exact cause of this condition is not known, but it may be related to:   Inflammation or irritation around the joint.   An injury.   Repetitive movements or overuse.   Arthritis.  What increases the risk?  You are more likely to develop this condition if:   You are a woman.   You are 15-40 years old.  What are the signs or symptoms?  The main symptom of this condition is a lump. It most often appears on the hand or wrist. In many cases, there are no other symptoms, but a cyst can sometimes cause:   Tingling.   Pain.   Numbness.   Muscle weakness.   Weak grip.   Less range of motion in a joint.  How is this diagnosed?  Ganglion cysts are usually diagnosed based on a physical exam. Your health care provider will feel the lump and may shine a light next to it. If it is a ganglion cyst, the light will likely shine through it.  Your health care provider may order an X-ray, ultrasound, or MRI to rule out other conditions.  How is this treated?  Ganglion cysts often  go away on their own without treatment. If you have pain or other symptoms, treatment may be needed. Treatment is also needed if the ganglion cyst limits your movement or if it gets infected. Treatment may include:   Wearing a brace or splint on your wrist or   finger.   Taking anti-inflammatory medicine.   Having fluid drained from the lump with a needle (aspiration).   Getting a steroid injected into the joint.   Having surgery to remove the ganglion cyst.   Placing a pad on your shoe or wearing shoes that will not rub against the cyst if it is on your foot.  Follow these instructions at home:   Do not press on the ganglion cyst, poke it with a needle, or hit it.   Take over-the-counter and prescription medicines only as told by your health care provider.   If you have a brace or splint:  ? Wear it as told by your health care provider.  ? Remove it as told by your health care provider. Ask if you need to remove it when you take a shower or a bath.   Watch your ganglion cyst for any changes.   Keep all follow-up visits as told by your health care provider. This is important.  Contact a health care provider if:   Your ganglion cyst becomes larger or more painful.   You have pus coming from the lump.   You have weakness or numbness in the affected area.   You have a fever or chills.  Get help right away if:   You have a fever and have any of these in the cyst area:  ? Increased redness.  ? Red streaks.  ? Swelling.  Summary   A ganglion cyst is a non-cancerous, fluid-filled lump that occurs near a joint or tendon.   Ganglion cysts most often develop in the hand or wrist, but they can also develop in the shoulder, elbow, hip, knee, ankle, or foot.   Ganglion cysts often go away on their own without treatment.  This information is not intended to replace advice given to you by your health care provider. Make sure you discuss any questions you have with your health care provider.  Document Released:  07/22/2000 Document Revised: 03/24/2017 Document Reviewed: 03/24/2017  Elsevier Interactive Patient Education  2019 Elsevier Inc.

## 2018-08-27 MED FILL — METHOCARBAMOL 500 MG TABS: 500 | 30 days supply | Qty: 90 | Fill #0

## 2018-08-30 ENCOUNTER — Ambulatory Visit: Payer: Self-pay | Attending: Family Medicine

## 2018-08-31 ENCOUNTER — Encounter (HOSPITAL_COMMUNITY): Payer: Self-pay | Admitting: Emergency Medicine

## 2018-08-31 ENCOUNTER — Emergency Department (HOSPITAL_COMMUNITY)
Admission: EM | Admit: 2018-08-31 | Discharge: 2018-08-31 | Disposition: A | Discharge status: Home / Self Care | Payer: Medicaid Other | Attending: Emergency Medicine | Admitting: Emergency Medicine

## 2018-08-31 DIAGNOSIS — G5603 Carpal tunnel syndrome, bilateral upper limbs: Secondary | ICD-10-CM | POA: Insufficient documentation

## 2018-08-31 DIAGNOSIS — F17210 Nicotine dependence, cigarettes, uncomplicated: Secondary | ICD-10-CM | POA: Insufficient documentation

## 2018-08-31 DIAGNOSIS — J45909 Unspecified asthma, uncomplicated: Secondary | ICD-10-CM | POA: Insufficient documentation

## 2018-08-31 DIAGNOSIS — Z79899 Other long term (current) drug therapy: Secondary | ICD-10-CM | POA: Insufficient documentation

## 2018-08-31 DIAGNOSIS — I10 Essential (primary) hypertension: Secondary | ICD-10-CM | POA: Insufficient documentation

## 2018-08-31 MED ORDER — HYDROCODONE-ACETAMINOPHEN 5-325 MG PO TABS
1.0000 | ORAL_TABLET | Freq: Once | ORAL | Status: AC
  Administered 2018-08-31: 1 via ORAL
  Filled 2018-08-31: qty 1

## 2018-08-31 MED ORDER — HYDROCODONE-ACETAMINOPHEN 5-325 MG PO TABS: 1.0000 | ORAL_TABLET | Freq: Every evening | ORAL | 0 refills | Status: DC | PRN

## 2018-08-31 MED ORDER — PREDNISONE 10 MG PO TABS: 40.0000 mg | ORAL_TABLET | Freq: Every day | ORAL | 0 refills | Status: DC

## 2018-08-31 MED FILL — predniSONE 10 MG TABS: 10 | 4 days supply | Qty: 16 | Fill #0

## 2018-08-31 NOTE — Discharge Instructions (Signed)
Continue to take the Naprosyn as directed. Follow up with your doctor to discuss continued treatment, physical therapy or referral. Only take the narcotic at night as it will make you sleepy.

## 2018-08-31 NOTE — ED Notes (Signed)
Ortho tech called to place split

## 2018-08-31 NOTE — ED Triage Notes (Signed)
Pt c/o carpal tunnel in bilat arms and seeing [facility] for it. But reports pain is really bad at night. Pt states that just got her blue and orange cards reinstated, so hoping to be able to get in sometime to see a surgeon.

## 2018-08-31 NOTE — ED Provider Notes (Signed)
[facility]-EMERGENCY DEPT Provider Note   CSN: [id number] Arrival date & time: 08/31/18  1403     History   Chief Complaint Chief Complaint  Patient presents with  . Carpal Tunnel    HPI [name] [name] is a 53 y.o. female who presents to the ED with c/o bilateral wrist pain. Patient reports she went to her doctor at [facility] and was dx with carpal tunnel. Patient has been wearing an elastic bandage that a friend gave her without relief. Patient reports the pain is increasing. It started in the right hand and now is in both wrists. Patient denies fever or other problems at this time. She does report taking ibuprofen and is afraid she may be taking to much.   HPI  Past Medical History:  Diagnosis Date  . Asthma   . Bipolar 1 disorder (HCC)   . Chronic lumbar pain   . DDD (degenerative disc disease)   . Depression   . GERD (gastroesophageal reflux disease)   . History of gastric ulcer   . Hyperlipidemia   . Hypertension   . Migraine   . Osteoarthritis   . Right ACL tear   . Right knee meniscal tear   . Sciatica   . Wears glasses     Patient Active Problem List   Diagnosis Date Noted  . Alcohol use disorder, severe, dependence (HCC) 11/23/2017  . Chronic midline low back pain without sciatica 11/23/2017  . Essential hypertension 11/23/2017  . Chronic pain of right knee 11/23/2017  . 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 Marijuana abuse 11/23/2017  . Tobacco dependence 11/23/2017  . Airway problem   . 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 Hypokalemia 07/30/2016  . Dehydration 07/30/2016  . Alcohol abuse 07/30/2016  . S/P ACL reconstruction 01/31/2014  . DEPRESSION 11/17/2008  . ACUTE SINUSITIS, UNSPECIFIED 11/17/2008  . ALLERGIC RHINITIS 11/17/2008  . GERD 11/17/2008  . HEADACHE 11/17/2008    Past Surgical History:  Procedure Laterality Date  . ABDOMINAL HYSTERECTOMY    . BUNIONECTOMY Left 2011  . HEEL SPUR  SURGERY Right 2012  . KNEE ARTHROSCOPY WITH ANTERIOR CRUCIATE LIGAMENT (ACL) REPAIR WITH HAMSTRING GRAFT Right 01/31/2014   Procedure: RIGHT KNEE ARTHROSCOPY WITH ALLOGRAFT (ACL) ANTERIOR CRUCIATE LIGAMENT RECONSTRUCTON PARTIAL MENISCECTOMY VERSES REPAIR ;  Surgeon: Eugenia Mcalpine, MD;  Location[facility];  Service: Orthopedics;  Laterality: Right;  . TOTAL ABDOMINAL HYSTERECTOMY W/ BILATERAL SALPINGOOPHORECTOMY  04-20-2006     OB History   No obstetric history on file.      Home Medications    Prior to Admission medications   Medication Sig Start Date End Date Taking? Authorizing Provider  amLODipine (NORVASC) 10 MG tablet Take 1 tablet (10 mg total) by mouth daily. 07/19/18   Anders Simmonds, PA-C  calcium carbonate (TUMS EX) 750 MG chewable tablet Chew 1 tablet by mouth 2 (two) times daily as needed for heartburn.    [provider]  diclofenac sodium (VOLTAREN) 1 % GEL Apply 2 g topically 4 (four) times daily. 08/23/18   Anders Simmonds, PA-C  HYDROcodone-acetaminophen (NORCO/VICODIN) 5-325 MG tablet Take 1 tablet by mouth at bedtime as needed for severe pain. 08/31/18   Janne Napoleon, NP  hydrOXYzine (ATARAX/VISTARIL) 25 MG tablet Take 1 tablet (25 mg total) by mouth at bedtime as needed. 07/19/18   Anders Simmonds, PA-C  methocarbamol (ROBAXIN) 500 MG tablet TAKE 1 TABLET (500 MG TOTAL) BY MOUTH EVERY 8 (EIGHT)  HOURS AS NEEDED FOR MUSCLE SPASMS. 08/27/18   Marcine Matar, MD  naproxen (NAPROSYN) 500 MG tablet Take 1 tablet (500 mg total) by mouth 2 (two) times daily with a meal. Prn pain 08/23/18   Anders Simmonds, PA-C  omeprazole (PRILOSEC) 40 MG capsule Take 1 capsule (40 mg total) by mouth daily. 11/23/17   Marcine Matar, MD  predniSONE (DELTASONE) 10 MG tablet Take 4 tablets (40 mg total) by mouth daily with breakfast. 08/31/18   Janne Napoleon, NP  promethazine-dextromethorphan (PROMETHAZINE-DM) 6.25-15 MG/5ML syrup Take 5 mLs by mouth 4 (four)  times daily as needed for cough. 08/23/18   Anders Simmonds, PA-C  sertraline (ZOLOFT) 50 MG tablet Take 1 tablet (50 mg total) by mouth daily. 07/19/18   Anders Simmonds, PA-C  thiamine (VITAMIN B-1) 100 MG tablet Take 1 tablet (100 mg total) by mouth daily. 11/23/17   Marcine Matar, MD    Family History Family History  Problem Relation Age of Onset  . Hypertension Father     Social History Social History   Tobacco Use  . Smoking status: Current Every Day Smoker    Packs/day: 0.25    Years: 32.00    Pack years: 8.00    Types: Cigarettes  . Smokeless tobacco: Never Used  Substance Use Topics  . Alcohol use: Yes    Alcohol/week: 14.0 standard drinks    Types: 14 Cans of beer per week    Comment: 4 40 ounce beers and liquor  . Drug use: Yes    Types: Marijuana    Comment: occasional  marijuana--  hx  "crack" use  last used 2004--  no treatment rehab (done on her own)     Allergies   Diphenhydramine hcl and Metoprolol   Review of Systems Review of Systems  Musculoskeletal: Positive for arthralgias.  All other systems reviewed and are negative.    Physical Exam Updated Vital Signs BP (!) 150/109   Pulse 84   Temp 98.6 F (37 C) (Oral)   Resp 16   SpO2 99%   Physical Exam Vitals signs and nursing note reviewed.  Constitutional:      Appearance: She is well-developed.  HENT:     Head: Normocephalic.     Nose: Nose normal.     Mouth/Throat:     Mouth: Mucous membranes are moist.  Eyes:     Conjunctiva/sclera: Conjunctivae normal.  Neck:     Musculoskeletal: Neck supple.  Cardiovascular:     Rate and Rhythm: Normal rate.  Pulmonary:     Effort: Pulmonary effort is normal.  Musculoskeletal:     Right wrist: She exhibits tenderness and swelling. She exhibits no crepitus, no deformity and no laceration. Decreased range of motion: due to pain.     Left wrist: She exhibits tenderness. She exhibits normal range of motion, no swelling, no deformity and  no laceration.     Comments: Radial pulses 2+, positive Tinel's right.    Skin:    General: Skin is warm and dry.  Neurological:     Mental Status: She is alert and oriented to person, place, and time.  Psychiatric:        Mood and Affect: Mood normal.      ED Treatments / Results  Labs (all labs ordered are listed, but only abnormal results are displayed) Labs Reviewed - No data to display  EKG None  Radiology No results found.  Procedures Procedures (including critical care time)  Medications Ordered in ED Medications  HYDROcodone-acetaminophen (NORCO/VICODIN) 5-325 MG per tablet 1 tablet (1 tablet Oral Given 08/31/18 1553)     Initial Impression / Assessment and Plan / ED Course  I have reviewed the triage vital signs and the nursing notes. 53 y.o. female here with increased pain bilateral wrists stable for d/c without focal neuro deficits. Wrist splint bilateral short course of prednisone and a few hydrocodone to take at bedtime is pain is interrupting sleep. Patient to f/u with her doctor to discuss PT or referral or other options.  Final Clinical Impressions(s) / ED Diagnoses   Final diagnoses:  Bilateral carpal tunnel syndrome       Janne Napoleoneese, Shiraz Bastyr M, NP 08/31/18 [id number]    Azalia Bilisampos, Kevin, MD 09/02/18 [phone]

## 2018-09-11 ENCOUNTER — Telehealth: Payer: Self-pay | Admitting: Internal Medicine

## 2018-09-11 NOTE — Telephone Encounter (Signed)
I LVM since [facility] is review her CAFA application and we need to know how the  PATIENT APPLYING FOR FINANCIAL ASSISTANCE HOWEVER THE LETTER OF SUPPORT INDICATE THEY ASSIST HE PATIENT WITH ONLY 15.00 DOLLARS PATIENT LIGHT BILL IS 150.00 TO 375.00 A MONTH PATIENT RECIEVES 353.00 IN FOOD STAMPS .Marland Kitchen NEED TO KNOW HOW PATIENT IS PAYING HER BILLS WITH 15.00 DOLLARS PATIENT NEEDS TO SHOW HOW THEY ARE PAYING THEIR LIGHT BILL AND OTHER BILLS. WILL HOLD ACCOUNT FOR 14 DAYS FOR PATIENT TO SHOW HOW THEY ARE PAYING THEIR BILLS, if they have any question to please call back

## 2018-09-13 ENCOUNTER — Telehealth: Payer: Self-pay | Admitting: Internal Medicine

## 2018-09-13 NOTE — Telephone Encounter (Signed)
I called Pt and explain her that the [facility] process it take 3 to 4 weeks to review her CAFA appliaction

## 2018-09-13 NOTE — Telephone Encounter (Signed)
Patient called to speak with you regarding paperwork. Please follow up.

## 2018-09-28 ENCOUNTER — Encounter: Payer: Self-pay | Admitting: Internal Medicine

## 2018-09-28 ENCOUNTER — Ambulatory Visit: Payer: Self-pay | Attending: Internal Medicine | Admitting: Internal Medicine

## 2018-09-28 ENCOUNTER — Other Ambulatory Visit: Payer: Self-pay | Admitting: Internal Medicine

## 2018-09-28 VITALS — BP 149/98 | HR 75 | Temp 98.1°F | Resp 16 | Wt 163.2 lb

## 2018-09-28 DIAGNOSIS — M62838 Other muscle spasm: Secondary | ICD-10-CM | POA: Insufficient documentation

## 2018-09-28 DIAGNOSIS — I10 Essential (primary) hypertension: Secondary | ICD-10-CM

## 2018-09-28 DIAGNOSIS — J069 Acute upper respiratory infection, unspecified: Secondary | ICD-10-CM

## 2018-09-28 DIAGNOSIS — M6740 Ganglion, unspecified site: Secondary | ICD-10-CM | POA: Insufficient documentation

## 2018-09-28 DIAGNOSIS — D649 Anemia, unspecified: Secondary | ICD-10-CM

## 2018-09-28 DIAGNOSIS — G5601 Carpal tunnel syndrome, right upper limb: Secondary | ICD-10-CM

## 2018-09-28 DIAGNOSIS — G8929 Other chronic pain: Secondary | ICD-10-CM

## 2018-09-28 DIAGNOSIS — K029 Dental caries, unspecified: Secondary | ICD-10-CM

## 2018-09-28 DIAGNOSIS — M79671 Pain in right foot: Secondary | ICD-10-CM

## 2018-09-28 MED ORDER — HYDROCHLOROTHIAZIDE 12.5 MG PO TABS: 12.5000 mg | ORAL_TABLET | Freq: Every day | ORAL | 3 refills | Status: DC

## 2018-09-28 MED ORDER — BENZONATATE 100 MG PO CAPS: 100.0000 mg | ORAL_CAPSULE | Freq: Two times a day (BID) | ORAL | 0 refills | Status: DC | PRN

## 2018-09-28 MED ORDER — CYCLOBENZAPRINE HCL 5 MG PO TABS: 5.0000 mg | ORAL_TABLET | Freq: Every day | ORAL | 1 refills | Status: DC | PRN

## 2018-09-28 MED ORDER — IBUPROFEN 600 MG PO TABS: 600.0000 mg | ORAL_TABLET | Freq: Three times a day (TID) | ORAL | 3 refills | Status: DC | PRN

## 2018-09-28 MED ORDER — AMLODIPINE BESYLATE 10 MG PO TABS: 10.0000 mg | ORAL_TABLET | Freq: Every day | ORAL | 3 refills | Status: DC

## 2018-09-28 MED FILL — IBUPROFEN 600 MG TABLET: 600 | 10 days supply | Qty: 30 | Fill #0

## 2018-09-28 MED FILL — AMLODIPINE BESYLATE 10 MG T: 10 | 30 days supply | Qty: 30 | Fill #0

## 2018-09-28 MED FILL — ?CYCLOBENZAPRINE 5MG TABLET: 5 | 30 days supply | Qty: 30 | Fill #0

## 2018-09-28 MED FILL — BENZONATATE 100 MG CAP: 100 | 10 days supply | Qty: 20 | Fill #0

## 2018-09-28 MED FILL — HYDROCHLOROTHIAZIDE 12.5 MG: 12.5 | 30 days supply | Qty: 30 | Fill #0

## 2018-09-28 NOTE — Patient Instructions (Signed)
Your blood pressure is not at goal.  Goal is 130/80 or lower.  Continue amlodipine.  We have added a blood pressure medication called hydrochlorothiazide that you will take once a day.  Stop the Naprosyn.  You can use the ibuprofen as needed for pain.  I have submitted the referral for you to see the dentist and the podiatrist.

## 2018-09-28 NOTE — Progress Notes (Signed)
Patient ID: [name] [name], female    DOB: June 10, 1966  MRN: [id number]  CC: Referral (Podiatrist and dentist ) and Hypertension   Subjective: [name] [name] is a 53 y.o. female who presents for chronic ds management Her concerns today include:  Patient with history of HTN, tob dep, bipolar disorder (was seeing a psychiatrist in past), HL, chronic LBP which she attributes to arthritis, ETOH abuse, ACD, marijuana, GERD  Pt c/o spasms in legs x 3 wks.  Only at nights when laying down and sometimes when walking.  Can last for up to 20 minutes.  Mainly in calf.  Robaxin "they don't work for me.  They only make me sleepy."  C/o pain in both hands RT>LT.  Pain mainly in wrist.  Little swelling numbness numbness.  She is currently unemployed.  She baby-sits her grandchildren and "I do the usual house work." Dx with CTS in RT hand on last visit.  Wears wrist splint on RT hand only.  Helps some.   Referred to hand surg for ganglion cyst on RT wrist which he states has been present for a few years.  She has noted no increase in size.  No pain. Ibuprofen works better than Naprosyn.  She would like prescription for ibuprofen instead.  She is on PPI to take with NSAIDs.  She would like to be switched from omeprazole to Nexium or Protonix stating that she was on one of those in the past and worked better for her.  Request  referral to [facility] for posterior heel spur.  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  She reports that surgery was being planned but at the time she lost coverage.  She just got her orange card reinstated.  Request referral to dentist for cavities to be addressed.  Reports that she has quit drinking since she last saw me last year.  HTN: Reports compliance with Norvasc.  She was on lisinopril HCTZ but this was changed after she reported to the PA in December that this medicine was making her feel bad.  She limits salt in the foods.  No chest  pains or shortness of breath. Patient Active Problem List   Diagnosis Date Noted  . Alcohol use disorder, severe, dependence (HCC) 11/23/2017  . Chronic midline low back pain without sciatica 11/23/2017  . Essential hypertension 11/23/2017  . Chronic pain of right knee 11/23/2017  . 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 Marijuana abuse 11/23/2017  . Tobacco dependence 11/23/2017  . Airway problem   . 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 Hypokalemia 07/30/2016  . Dehydration 07/30/2016  . Alcohol abuse 07/30/2016  . S/P ACL reconstruction 01/31/2014  . DEPRESSION 11/17/2008  . ACUTE SINUSITIS, UNSPECIFIED 11/17/2008  . ALLERGIC RHINITIS 11/17/2008  . GERD 11/17/2008  . HEADACHE 11/17/2008     Current Outpatient Medications on File Prior to Visit  Medication Sig Dispense Refill  . calcium carbonate (TUMS EX) 750 MG chewable tablet Chew 1 tablet by mouth 2 (two) times daily as needed for heartburn.    . diclofenac sodium (VOLTAREN) 1 % GEL Apply 2 g topically 4 (four) times daily. 100 g 1  . hydrOXYzine (ATARAX/VISTARIL) 25 MG tablet Take 1 tablet (25 mg total) by mouth at bedtime as needed. 30 tablet 1  . sertraline (ZOLOFT) 50 MG tablet Take 1 tablet (50 mg total) by mouth daily. 30 tablet 1   No current facility-administered medications on file prior to visit.  Allergies  Allergen Reactions  . Diphenhydramine Hcl Itching  . Metoprolol Palpitations    Social History   Socioeconomic History  . Marital status: Legally Separated    Spouse name: Not on file  . Number of children: Not on file  . Years of education: Not on file  . Highest education level: Not on file  Occupational History  . Not on file  Social Needs  . Financial resource strain: Not on file  . Food insecurity:    Worry: Not on file    Inability: Not on file  . Transportation needs:    Medical: Not on file    Non-medical: Not on file  Tobacco Use  . Smoking status: Current Every Day  Smoker    Packs/day: 0.25    Years: 32.00    Pack years: 8.00    Types: Cigarettes  . Smokeless tobacco: Never Used  Substance and Sexual Activity  . Alcohol use: Yes    Alcohol/week: 14.0 standard drinks    Types: 14 Cans of beer per week    Comment: 4 40 ounce beers and liquor  . Drug use: Yes    Types: Marijuana    Comment: occasional  marijuana--  hx  "crack" use  last used 2004--  no treatment rehab (done on her own)  . Sexual activity: Not on file  Lifestyle  . Physical activity:    Days per week: Not on file    Minutes per session: Not on file  . Stress: Not on file  Relationships  . Social connections:    Talks on phone: Not on file    Gets together: Not on file    Attends religious service: Not on file    Active member of club or organization: Not on file    Attends meetings of clubs or organizations: Not on file    Relationship status: Not on file  . Intimate partner violence:    Fear of current or ex partner: Not on file    Emotionally abused: Not on file    Physically abused: Not on file    Forced sexual activity: Not on file  Other Topics Concern  . Not on file  Social History Narrative  . Not on file    Family History  Problem Relation Age of Onset  . Hypertension Father     Past Surgical History:  Procedure Laterality Date  . ABDOMINAL HYSTERECTOMY    . BUNIONECTOMY Left 2011  . HEEL SPUR SURGERY Right 2012  . KNEE ARTHROSCOPY WITH ANTERIOR CRUCIATE LIGAMENT (ACL) REPAIR WITH HAMSTRING GRAFT Right 01/31/2014   Procedure: RIGHT KNEE ARTHROSCOPY WITH ALLOGRAFT (ACL) ANTERIOR CRUCIATE LIGAMENT RECONSTRUCTON PARTIAL MENISCECTOMY VERSES REPAIR ;  Surgeon: Eugenia Mcalpineobert Collins, MD;  [facility];  Service: Orthopedics;  Laterality: Right;  . TOTAL ABDOMINAL HYSTERECTOMY W/ BILATERAL SALPINGOOPHORECTOMY  04-20-2006    ROS: Review of Systems  Respiratory:       Complains of having a cold for the past few weeks.  Most bothersome symptom  is a cough at night.  No fever no shortness of breath.   Negative except as stated above  PHYSICAL EXAM: BP (!) 149/98   Pulse 75   Temp 98.1 F (36.7 C) (Oral)   Resp 16   Wt 163 lb 3.2 oz (74 kg)   SpO2 98%   BMI 28.91 kg/m   Physical Exam BP 138/90-  General appearance - alert, well appearing, and in no distress Mental status - normal  mood, behavior, speech, dress, motor activity, and thought processes Mouth -she has cavity of third molar on each side in the lower jaw  neck - supple, no significant adenopathy Chest - clear to auscultation, no wheezes, rales or rhonchi, symmetric air entry Heart - normal rate, regular rhythm, normal S1, S2, no murmurs, rubs, clicks or gallops Musculoskeletal -wrist splint was removed from the right wrist.  Grip 4+/5 bilaterally.  No wasting of intrinsic hand muscles.  No wasting of thenar eminence.  She has a blueberry sized ganglion cyst on the radial aspect of the right wrist Right foot -firm raised hypopigmented lesion lateral to the Achilles tendon Extremities -no lower extremity edema.  Feet are warm.  Dorsalis pedis, posterior tibialis and popliteal pulses are 3+ bilaterally.  CMP Latest Ref Rng & Units 11/23/2017 07/12/2017 08/01/2016  Glucose 65 - 99 mg/dL 73 88 161(W)  BUN 6 - 24 mg/dL 14 7 16   Creatinine 0.57 - 1.00 mg/dL 9.60 4.54 0.98  Sodium 134 - 144 mmol/L 137 139 138  Potassium 3.5 - 5.2 mmol/L 4.5 3.0(L) 3.9  Chloride 96 - 106 mmol/L 99 104 109  CO2 20 - 29 mmol/L 27 27 25   Calcium 8.7 - 10.2 mg/dL 9.4 8.9 1.1(B)  Total Protein 6.0 - 8.5 g/dL 7.7 - -  Total Bilirubin 0.0 - 1.2 mg/dL <1.4 - -  Alkaline Phos 39 - 117 IU/L 59 - -  AST 0 - 40 IU/L 32 - -  ALT 0 - 32 IU/L 19 - -   Lipid Panel     Component Value Date/Time   CHOL 250 (H) 11/23/2017 1619   TRIG 75 11/23/2017 1619   HDL 97 11/23/2017 1619   CHOLHDL 2.6 11/23/2017 1619   CHOLHDL 4.5 Ratio 04/23/2007 2021   VLDL 39 04/23/2007 2021   LDLCALC 138 (H) 11/23/2017  1619    CBC    Component Value Date/Time   WBC 5.9 11/23/2017 1619   WBC 6.3 07/12/2017 1647   RBC 3.60 (L) 11/23/2017 1619   RBC 3.30 (L) 07/12/2017 1647   HGB 11.5 11/23/2017 1619   HCT 34.4 11/23/2017 1619   PLT 361 11/23/2017 1619   MCV 96 11/23/2017 1619   MCH 31.9 11/23/2017 1619   MCH 32.4 07/12/2017 1647   MCHC 33.4 11/23/2017 1619   MCHC 35.0 07/12/2017 1647   RDW 13.8 11/23/2017 1619   LYMPHSABS 3.5 07/12/2017 1647   MONOABS 0.5 07/12/2017 1647   EOSABS 0.1 07/12/2017 1647   BASOSABS 0.0 07/12/2017 1647    ASSESSMENT AND PLAN:  1. Essential hypertension Not at goal.  Continue Norvasc.  Add HCTZ - hydrochlorothiazide (HYDRODIURIL) 12.5 MG tablet; Take 1 tablet (12.5 mg total) by mouth daily.  Dispense: 90 tablet; Refill: 3 - amLODipine (NORVASC) 10 MG tablet; Take 1 tablet (10 mg total) by mouth daily.  Dispense: 90 tablet; Refill: 3 - CBC - Comprehensive metabolic panel - Lipid panel  2. Carpal tunnel syndrome of right wrist Refer to neurology for nerve conduction study on the upper extremities.  Stop Naprosyn.  Change to ibuprofen instead.  Continue wrist splint - ibuprofen (ADVIL,MOTRIN) 600 MG tablet; Take 1 tablet (600 mg total) by mouth every 8 (eight) hours as needed.  Dispense: 30 tablet; Refill: 3 - Ambulatory referral to Neurology  3. Ganglion cyst Recommend observation  4. Muscle spasm Stop Robaxin.  Change to Flexeril instead.  Patient informed that medication can cause drowsiness - cyclobenzaprine (FLEXERIL) 5 MG tablet; Take 1 tablet (5 mg  total) by mouth daily as needed for muscle spasms.  Dispense: 30 tablet; Refill: 1  5. Heel pain, chronic, right - Ambulatory referral to Podiatry  6. Viral upper respiratory tract infection - benzonatate (TESSALON) 100 MG capsule; Take 1 capsule (100 mg total) by mouth 2 (two) times daily as needed for cough.  Dispense: 20 capsule; Refill: 0  7. Dental cavities - Ambulatory referral to  Dentistry   Patient was given the opportunity to ask questions.  Patient verbalized understanding of the plan and was able to repeat key elements of the plan.   Orders Placed This Encounter  Procedures  . CBC  . Comprehensive metabolic panel  . Lipid panel  . Ambulatory referral to Podiatry  . Ambulatory referral to Dentistry  . Ambulatory referral to Neurology     Requested Prescriptions   Signed Prescriptions Disp Refills  . benzonatate (TESSALON) 100 MG capsule 20 capsule 0    Sig: Take 1 capsule (100 mg total) by mouth 2 (two) times daily as needed for cough.  . hydrochlorothiazide (HYDRODIURIL) 12.5 MG tablet 90 tablet 3    Sig: Take 1 tablet (12.5 mg total) by mouth daily.  Marland Kitchen amLODipine (NORVASC) 10 MG tablet 90 tablet 3    Sig: Take 1 tablet (10 mg total) by mouth daily.  Marland Kitchen ibuprofen (ADVIL,MOTRIN) 600 MG tablet 30 tablet 3    Sig: Take 1 tablet (600 mg total) by mouth every 8 (eight) hours as needed.  . cyclobenzaprine (FLEXERIL) 5 MG tablet 30 tablet 1    Sig: Take 1 tablet (5 mg total) by mouth daily as needed for muscle spasms.    Return in about 3 months (around 12/27/2018).  Jonah Blue, MD, FACP

## 2018-09-28 NOTE — Progress Notes (Signed)
Pt states she still has been having spasms in her back   Pt states she still has a cough  Pt states her right wrist is still hurting and hurts the worst   Pt states the pain is worse at night  Pt states the muscle relaxer are not working

## 2018-09-29 LAB — CBC
Hematocrit: 31.9 % — ABNORMAL LOW (ref 34.0–46.6)
Hemoglobin: 11.1 g/dL (ref 11.1–15.9)
MCH: 31.4 pg (ref 26.6–33.0)
MCHC: 34.8 g/dL (ref 31.5–35.7)
MCV: 90 fL (ref 79–97)
Platelets: 370 10*3/uL (ref 150–450)
RBC: 3.54 x10E6/uL — ABNORMAL LOW (ref 3.77–5.28)
RDW: 11.8 % (ref 11.7–15.4)
WBC: 5.5 10*3/uL (ref 3.4–10.8)

## 2018-09-29 LAB — LIPID PANEL
Chol/HDL Ratio: 3.9 ratio (ref 0.0–4.4)
Cholesterol, Total: 189 mg/dL (ref 100–199)
HDL: 49 mg/dL (ref >39)
LDL Calculated: 109 mg/dL — ABNORMAL HIGH (ref 0–99)
Triglycerides: 153 mg/dL — ABNORMAL HIGH (ref 0–149)
VLDL Cholesterol Cal: 31 mg/dL (ref 5–40)

## 2018-09-29 LAB — COMPREHENSIVE METABOLIC PANEL
ALT: 17 IU/L (ref 0–32)
AST: 21 IU/L (ref 0–40)
Albumin/Globulin Ratio: 1.2 (ref 1.2–2.2)
Albumin: 3.8 g/dL (ref 3.8–4.9)
Alkaline Phosphatase: 68 IU/L (ref 39–117)
BUN/Creatinine Ratio: 22 (ref 9–23)
BUN: 16 mg/dL (ref 6–24)
Bilirubin Total: <0.2 mg/dL (ref 0.0–1.2)
CO2: 27 mmol/L (ref 20–29)
Calcium: 9.3 mg/dL (ref 8.7–10.2)
Chloride: 100 mmol/L (ref 96–106)
Creatinine, Ser: 0.73 mg/dL (ref 0.57–1.00)
GFR calc Af Amer: 108 mL/min/{1.73_m2} (ref >59)
GFR calc non Af Amer: 94 mL/min/{1.73_m2} (ref >59)
Globulin, Total: 3.1 g/dL (ref 1.5–4.5)
Glucose: 74 mg/dL (ref 65–99)
Potassium: 4.3 mmol/L (ref 3.5–5.2)
Sodium: 141 mmol/L (ref 134–144)
Total Protein: 6.9 g/dL (ref 6.0–8.5)

## 2018-09-30 NOTE — Addendum Note (Signed)
Addended by: Jonah Blue B on: 09/30/2018 06:01 PM   Modules accepted: Orders

## 2018-10-02 LAB — IRON,TIBC AND FERRITIN PANEL
Ferritin: 76 ng/mL (ref 15–150)
Iron Saturation: 28 % (ref 15–55)
Iron: 90 ug/dL (ref 27–159)
Total Iron Binding Capacity: 317 ug/dL (ref 250–450)
UIBC: 227 ug/dL (ref 131–425)

## 2018-10-02 LAB — SPECIMEN STATUS REPORT

## 2018-10-02 NOTE — Telephone Encounter (Signed)
I called the Pt explain her that she applied for CAFA on 08/30/2018, I received an email from [employer] saying  PATIENT APPLYING FOR FINANCIAL ASSISTANCE HOWEVER THE LETTER OF SUPPORT INDICATE THEY ASSIST HE PATIENT WITH ONLY 15.00 DOLLARS PATIENT LIGHT BILL IS 150.00 TO 375.00 A MONTH PATIENT RECIEVES 353.00 IN FOOD STAMPS .Marland Kitchen NEED TO KNOW HOW PATIENT IS PAYING HER BILLS WITH 15.00 DOLLARS PATIENT NEEDS TO SHOW HOW THEY ARE PAYING THEIR LIGHT BILL AND OTHER BILLS. WILL HOLD ACCOUNT FOR 14 DAYS FOR PATIENT TO SHOW HOW THEY ARE PAYING THEIR BILLS I called her on the same day I got the e-mail LVM to her  And documents the call, now has been pass 21 days and she inquire about her application according to the rules she will be denied for 6 month since she did not submit the correct documentation [facility] need it for her approval, I also provide her with the [facility] costumer service number since at this time I can not do anything.

## 2018-10-02 NOTE — Telephone Encounter (Signed)
Follow up   Pt is calling states she lost her cafa letter that she received in the mail and that she spoke with you about agencies paying her light bill and that she gets food stamps. Please f/u

## 2018-10-06 ENCOUNTER — Other Ambulatory Visit: Payer: Self-pay

## 2018-10-06 ENCOUNTER — Emergency Department (HOSPITAL_COMMUNITY): Payer: Self-pay

## 2018-10-06 ENCOUNTER — Encounter (HOSPITAL_COMMUNITY): Payer: Self-pay | Admitting: *Deleted

## 2018-10-06 ENCOUNTER — Emergency Department (HOSPITAL_COMMUNITY)
Admission: EM | Admit: 2018-10-06 | Discharge: 2018-10-06 | Disposition: A | Discharge status: Home / Self Care | Payer: Self-pay | Attending: Emergency Medicine | Admitting: Emergency Medicine

## 2018-10-06 DIAGNOSIS — Z79899 Other long term (current) drug therapy: Secondary | ICD-10-CM | POA: Insufficient documentation

## 2018-10-06 DIAGNOSIS — J45909 Unspecified asthma, uncomplicated: Secondary | ICD-10-CM | POA: Insufficient documentation

## 2018-10-06 DIAGNOSIS — F17210 Nicotine dependence, cigarettes, uncomplicated: Secondary | ICD-10-CM | POA: Insufficient documentation

## 2018-10-06 DIAGNOSIS — M272 Inflammatory conditions of jaws: Secondary | ICD-10-CM | POA: Insufficient documentation

## 2018-10-06 DIAGNOSIS — L03211 Cellulitis of face: Secondary | ICD-10-CM | POA: Insufficient documentation

## 2018-10-06 DIAGNOSIS — I10 Essential (primary) hypertension: Secondary | ICD-10-CM | POA: Insufficient documentation

## 2018-10-06 LAB — COMPREHENSIVE METABOLIC PANEL
ALT: 18 U/L (ref 0–44)
AST: 19 U/L (ref 15–41)
Albumin: 3.6 g/dL (ref 3.5–5.0)
Alkaline Phosphatase: 56 U/L (ref 38–126)
Anion gap: 6 (ref 5–15)
BUN: 15 mg/dL (ref 6–20)
CO2: 27 mmol/L (ref 22–32)
Calcium: 9 mg/dL (ref 8.9–10.3)
Chloride: 104 mmol/L (ref 98–111)
Creatinine, Ser: 0.83 mg/dL (ref 0.44–1.00)
GFR calc Af Amer: >60 mL/min (ref >60)
GFR calc non Af Amer: >60 mL/min (ref >60)
Glucose, Bld: 101 mg/dL — ABNORMAL HIGH (ref 70–99)
Potassium: 3.2 mmol/L — ABNORMAL LOW (ref 3.5–5.1)
Sodium: 137 mmol/L (ref 135–145)
Total Bilirubin: 0.5 mg/dL (ref 0.3–1.2)
Total Protein: 7.4 g/dL (ref 6.5–8.1)

## 2018-10-06 LAB — CBC WITH DIFFERENTIAL/PLATELET
Abs Immature Granulocytes: 0.03 10*3/uL (ref 0.00–0.07)
Basophils Absolute: 0 10*3/uL (ref 0.0–0.1)
Basophils Relative: 0 %
Eosinophils Absolute: 0 10*3/uL (ref 0.0–0.5)
Eosinophils Relative: 0 %
HCT: 34.6 % — ABNORMAL LOW (ref 36.0–46.0)
Hemoglobin: 11.2 g/dL — ABNORMAL LOW (ref 12.0–15.0)
Immature Granulocytes: 0 %
Lymphocytes Relative: 28 %
Lymphs Abs: 2 10*3/uL (ref 0.7–4.0)
MCH: 30.5 pg (ref 26.0–34.0)
MCHC: 32.4 g/dL (ref 30.0–36.0)
MCV: 94.3 fL (ref 80.0–100.0)
Monocytes Absolute: 0.8 10*3/uL (ref 0.1–1.0)
Monocytes Relative: 11 %
Neutro Abs: 4.5 10*3/uL (ref 1.7–7.7)
Neutrophils Relative %: 61 %
Platelets: 328 10*3/uL (ref 150–400)
RBC: 3.67 MIL/uL — ABNORMAL LOW (ref 3.87–5.11)
RDW: 12.7 % (ref 11.5–15.5)
WBC: 7.4 10*3/uL (ref 4.0–10.5)
nRBC: 0 % (ref 0.0–0.2)

## 2018-10-06 MED ORDER — AMOXICILLIN-POT CLAVULANATE 875-125 MG PO TABS: 1.0000 | ORAL_TABLET | Freq: Two times a day (BID) | ORAL | 0 refills | Status: DC

## 2018-10-06 MED ORDER — IOHEXOL 300 MG/ML  SOLN
75.0000 mL | Freq: Once | INTRAMUSCULAR | Status: AC | PRN
  Administered 2018-10-06: 75 mL via INTRAVENOUS

## 2018-10-06 MED ORDER — AMOXICILLIN-POT CLAVULANATE 875-125 MG PO TABS: 1.0000 | ORAL_TABLET | Freq: Two times a day (BID) | ORAL | 0 refills | Status: AC

## 2018-10-06 MED ORDER — TRAMADOL HCL 50 MG PO TABS: 50.0000 mg | ORAL_TABLET | Freq: Four times a day (QID) | ORAL | 0 refills | Status: DC | PRN

## 2018-10-06 MED ORDER — IOPAMIDOL (ISOVUE-300) INJECTION 61%: 100.0000 mL | Freq: Once | INTRAVENOUS | Status: DC | PRN

## 2018-10-06 MED ORDER — SODIUM CHLORIDE 0.9 % IV BOLUS
1000.0000 mL | Freq: Once | INTRAVENOUS | Status: AC
  Administered 2018-10-06: 1000 mL via INTRAVENOUS

## 2018-10-06 MED ORDER — SODIUM CHLORIDE (PF) 0.9 % IJ SOLN
INTRAMUSCULAR | Status: AC
  Filled 2018-10-06: qty 50

## 2018-10-06 MED ORDER — SODIUM CHLORIDE 0.9 % IV SOLN
1.5000 g | Freq: Once | INTRAVENOUS | Status: AC
  Administered 2018-10-06: 1.5 g via INTRAVENOUS
  Filled 2018-10-06: qty 1.5

## 2018-10-06 NOTE — ED Triage Notes (Signed)
Pt complains of jaw swelling since yesterday, worse in right side. Pt states it became woree this morning and has spread to her neck. Pt has cavity in right lower molar and has been unable to see dentist yet.

## 2018-10-06 NOTE — ED Notes (Signed)
ED Provider at bedside. 

## 2018-10-06 NOTE — ED Provider Notes (Signed)
[facility]-EMERGENCY DEPT Provider Note   CSN: [id number] Arrival date & time: 10/06/18  1158    History   Chief Complaint Chief Complaint  Patient presents with  . Jaw Pain  . Facial Swelling    HPI [name] [name] is a 53 y.o. female.     HPI   Had some swelling right side and now has spread to neck and having right facial pain and neck pain Yesterday morning had some swelling, this is the third time it has happened, seeing [facility] and being referred to dentist Sometimes has dental pain left side, sometimes right, has been going on for a long time No insurance, has orange card Has hx of abscess, cellulitis Fatigue, chills for 2-3 days Woke up with right sided pain yesterday and swelling, swelling worsened this AM Has hx of this before Nausea No voice changes, no swallowing problems, no trouble breathing Tried taking leftover amoxicillin and ibuprofen Pain with turning neck and swelling nad pain extending into neck    Past Medical History:  Diagnosis Date  . Asthma   . Bipolar 1 disorder (HCC)   . Chronic lumbar pain   . DDD (degenerative disc disease)   . Depression   . GERD (gastroesophageal reflux disease)   . History of gastric ulcer   . Hyperlipidemia   . Hypertension   . Migraine   . Osteoarthritis   . Right ACL tear   . Right knee meniscal tear   . Sciatica   . Wears glasses     Patient Active Problem List   Diagnosis Date Noted  . Muscle spasm 09/28/2018  . Ganglion cyst 09/28/2018  . Carpal tunnel syndrome of right wrist 09/28/2018  . Chronic midline low back pain without sciatica 11/23/2017  . Essential hypertension 11/23/2017  . Chronic pain of right knee 11/23/2017  . 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 Marijuana abuse 11/23/2017  . Tobacco dependence 11/23/2017  . Alcohol abuse 07/30/2016  . S/P ACL reconstruction 01/31/2014  . DEPRESSION 11/17/2008  . ALLERGIC RHINITIS  11/17/2008  . GERD 11/17/2008    Past Surgical History:  Procedure Laterality Date  . ABDOMINAL HYSTERECTOMY    . BUNIONECTOMY Left 2011  . HEEL SPUR SURGERY Right 2012  . KNEE ARTHROSCOPY WITH ANTERIOR CRUCIATE LIGAMENT (ACL) REPAIR WITH HAMSTRING GRAFT Right 01/31/2014   Procedure: RIGHT KNEE ARTHROSCOPY WITH ALLOGRAFT (ACL) ANTERIOR CRUCIATE LIGAMENT RECONSTRUCTON PARTIAL MENISCECTOMY VERSES REPAIR ;  Surgeon: Eugenia Mcalpine, MD;  Location: [facility];  Service: Orthopedics;  Laterality: Right;  . TOTAL ABDOMINAL HYSTERECTOMY W/ BILATERAL SALPINGOOPHORECTOMY  04-20-2006     OB History   No obstetric history on file.      Home Medications    Prior to Admission medications   Medication Sig Start Date End Date Taking? Authorizing Provider  amLODipine (NORVASC) 10 MG tablet Take 1 tablet (10 mg total) by mouth daily. 09/28/18  Yes Marcine Matar, MD  Ascorbic Acid (VITAMIN C PO) Take 1 tablet by mouth daily.   Yes [provider]  benzonatate (TESSALON) 100 MG capsule Take 1 capsule (100 mg total) by mouth 2 (two) times daily as needed for cough. 09/28/18  Yes Marcine Matar, MD  Cyanocobalamin (VITAMIN B-12 PO) Take 1 tablet by mouth daily.   Yes [provider]  cyclobenzaprine (FLEXERIL) 5 MG tablet Take 1 tablet (5 mg total) by mouth daily as needed for muscle spasms. 09/28/18  Yes Marcine Matar,  MD  diclofenac sodium (VOLTAREN) 1 % GEL Apply 2 g topically 4 (four) times daily. 08/23/18  Yes McClung, Angela M, PA-C  hydrochlorothiazide (HYDRODIURIL) 12.5 MG tablet Take 1 tablet (12.5 mg total) by mouth daily. 09/28/18  Yes Marcine Matar, MD  hydrOXYzine (ATARAX/VISTARIL) 25 MG tablet Take 1 tablet (25 mg total) by mouth at bedtime as needed. Patient taking differently: Take 25 mg by mouth at bedtime as needed (sleep).  07/19/18  Yes Georgian Co M, PA-C  ibuprofen (ADVIL,MOTRIN) 600 MG tablet Take 1 tablet (600 mg total) by mouth  every 8 (eight) hours as needed. Patient taking differently: Take 600 mg by mouth every 8 (eight) hours as needed for moderate pain.  09/28/18  Yes Marcine Matar, MD  Multiple Vitamin (MULTIVITAMIN) tablet Take 1 tablet by mouth daily.   Yes [provider]  Omega-3 Fatty Acids (OMEGA-3 FISH OIL PO) Take 1 tablet by mouth daily.   Yes [provider]  sertraline (ZOLOFT) 50 MG tablet Take 1 tablet (50 mg total) by mouth daily. 07/19/18  Yes Anders Simmonds, PA-C  amoxicillin-clavulanate (AUGMENTIN) 875-125 MG tablet Take 1 tablet by mouth every 12 (twelve) hours for 10 days. 10/06/18 10/16/18  Alvira Monday, MD  traMADol (ULTRAM) 50 MG tablet Take 1 tablet (50 mg total) by mouth every 6 (six) hours as needed. 10/06/18   Alvira Monday, MD    Family History Family History  Problem Relation Age of Onset  . Hypertension Father     Social History Social History   Tobacco Use  . Smoking status: Current Every Day Smoker    Packs/day: 0.25    Years: 32.00    Pack years: 8.00    Types: Cigarettes  . Smokeless tobacco: Never Used  Substance Use Topics  . Alcohol use: Yes    Alcohol/week: 14.0 standard drinks    Types: 14 Cans of beer per week    Comment: 4 40 ounce beers and liquor  . Drug use: Yes    Types: Marijuana    Comment: occasional  marijuana--  hx  "crack" use  last used 2004--  no treatment rehab (done on her own)     Allergies   Diphenhydramine hcl and Metoprolol   Review of Systems Review of Systems  Constitutional: Positive for chills and fatigue.  HENT: Negative for congestion, trouble swallowing and voice change.   Respiratory: Negative for cough and shortness of breath.   Cardiovascular: Negative for chest pain.  Gastrointestinal: Positive for nausea. Negative for abdominal pain and vomiting.  Musculoskeletal: Positive for neck pain.  Skin: Positive for rash.  Neurological: Positive for headaches.     Physical Exam Updated Vital  Signs BP 133/80 (BP Location: Right Arm)   Pulse 76   Temp 98.7 F (37.1 C) (Oral)   Resp 14   SpO2 100%   Physical Exam Vitals signs and nursing note reviewed.  Constitutional:      General: She is not in acute distress.    Appearance: She is well-developed. She is not diaphoretic.  HENT:     Head: Normocephalic and atraumatic.     Comments: Right jaw tenderness and swelling Eyes:     Conjunctiva/sclera: Conjunctivae normal.  Neck:     Musculoskeletal: Normal range of motion.  Cardiovascular:     Rate and Rhythm: Normal rate and regular rhythm.     Heart sounds: Normal heart sounds. No murmur. No friction rub. No gallop.   Pulmonary:  Effort: Pulmonary effort is normal. No respiratory distress.     Breath sounds: Normal breath sounds. No wheezing or rales.  Abdominal:     General: There is no distension.     Palpations: Abdomen is soft.     Tenderness: There is no abdominal tenderness. There is no guarding.  Musculoskeletal:        General: No tenderness.  Skin:    General: Skin is warm and dry.     Findings: No erythema or rash.  Neurological:     Mental Status: She is alert and oriented to person, place, and time.      ED Treatments / Results  Labs (all labs ordered are listed, but only abnormal results are displayed) Labs Reviewed  CBC WITH DIFFERENTIAL/PLATELET - Abnormal; Notable for the following components:      Result Value   RBC 3.67 (*)    Hemoglobin 11.2 (*)    HCT 34.6 (*)    All other components within normal limits  COMPREHENSIVE METABOLIC PANEL - Abnormal; Notable for the following components:   Potassium 3.2 (*)    Glucose, Bld 101 (*)    All other components within normal limits    EKG None  Radiology Ct Soft Tissue Neck W Contrast  Result Date: 10/06/2018 CLINICAL DATA:  Neck and jaw swelling. EXAM: CT NECK WITH CONTRAST TECHNIQUE: Multidetector CT imaging of the neck was performed using the standard protocol following the bolus  administration of intravenous contrast. CONTRAST:  65mL OMNIPAQUE IOHEXOL 300 MG/ML  SOLN COMPARISON:  07/12/2017 FINDINGS: Pharynx and larynx: Symmetric prominence of the oropharyngeal and nasopharyngeal lymphoid tissue, similar to the prior study and without a focal mass identified. Patent airway. Salivary glands: No inflammation, mass, or stone. Thyroid: Unremarkable. Lymph nodes: Borderline enlarged lymph nodes primarily in the right neck, likely reactive including 10 mm short axis submental and right submandibular lymph nodes. Vascular: Major vascular structures of the neck are patent. Limited intracranial: Unremarkable. Visualized orbits: Unremarkable. Mastoids and visualized paranasal sinuses: Clear. Skeleton: Multiple dental caries. Mildly increased periapical lucency associated with the right mandibular second molar tooth. Cervical spondylosis with left greater than right neural foraminal stenosis at C5-6. Upper chest: Clear lung apices. Other: Moderately extensive swelling/inflammation involving the right lower face including buccal space and subcutaneous soft tissues in a similar distribution to the prior study though likely slightly greater in severity. No drainable fluid collection. IMPRESSION: Right lower facial cellulitis without drainable fluid collection, possibly odontogenic in origin given mild worsening of periapical lucency/periapical abscess associated with a right mandibular molar tooth. Electronically Signed   By: Sebastian Ache M.D.   On: 10/06/2018 15:24    Procedures Procedures (including critical care time)  Medications Ordered in ED Medications  sodium chloride 0.9 % bolus 1,000 mL (0 mLs Intravenous Stopped 10/06/18 1415)  ampicillin-sulbactam (UNASYN) 1.5 g in sodium chloride 0.9 % 100 mL IVPB ( Intravenous Stopped 10/06/18 1405)  iohexol (OMNIPAQUE) 300 MG/ML solution 75 mL (75 mLs Intravenous Contrast Given 10/06/18 1444)     Initial Impression / Assessment and Plan / ED  Course  I have reviewed the triage vital signs and the nursing notes.  Pertinent labs & imaging results that were available during my care of the patient were reviewed by me and considered in my medical decision making (see chart for details).        53 year old female with the history above including history of multiple odontogenic infections and admission for concern for spread into  retropharyngeal space who presents with concern for right facial swelling and pain with extension into neck.  CT neck shows no sign of drainable fluid collection, with signs of cellulitis and periapical abscess.    Given rx for augmentin. Discussed risk of tramadol, gave short rx and rec ibuprofen/tylenol.  Reviewed in [place] drug database. Patient discharged in stable condition with understanding of reasons to return.   Final Clinical Impressions(s) / ED Diagnoses   Final diagnoses:  Odontogenic infection of jaw  Cellulitis of face    ED Discharge Orders         Ordered    amoxicillin-clavulanate (AUGMENTIN) 875-125 MG tablet  Every 12 hours,   Status:  Discontinued     10/06/18 1559    amoxicillin-clavulanate (AUGMENTIN) 875-125 MG tablet  Every 12 hours     10/06/18 1621    traMADol (ULTRAM) 50 MG tablet  Every 6 hours PRN     10/06/18 1621           Alvira Monday, MD 10/06/18 2047

## 2018-10-08 ENCOUNTER — Telehealth: Payer: Self-pay

## 2018-10-08 MED FILL — AMOX-CLAV 875-125 MG TABLET: 875-125 | 10 days supply | Qty: 20 | Fill #0

## 2018-10-08 NOTE — Telephone Encounter (Signed)
Contacted pt to go over lab results pt is aware and doesn't have any questions or concerns  Dr. Laural Benes pt states she has been having spams in her right leg mainly in the calf and it radiates. Pt states she has been taking the muscle relaxer but it doesn't seem to work.   I have schedule pt an appointment with Marylene Land for Wednesday

## 2018-10-09 NOTE — Progress Notes (Signed)
Patient ID: [name] [name], female   DOB: [birth year], 53 y.o.   MRN: [id number]   [name] [name], is a 53 y.o. female  [id number]:[id number]  [id number]:[id number]  DOB - October 01, 1965  Subjective:  Chief Complaint and HPI: [name] [name] is a 53 y.o. female here with continued pain in L calf.  Seems to be worsening and now having swelling in that leg/foot in the mornings.  No SOB.  No CP.  +smoker and sedentary lifestyle.  Pain and cramping/muscle spasms present for ~3 months.  5mg  flexeril helps a little  Needs new med for GERD.    seen in the ED 2/29 for abscess tooth.  K+ was 3.2.    From ED A/P: 53 year old female with the history above including history of multiple odontogenic infections and admission for concern for spread into retropharyngeal space who presents with concern for right facial swelling and pain with extension into neck.  CT neck shows no sign of drainable fluid collection, with signs of cellulitis and periapical abscess.    Given rx for augmentin. Discussed risk of tramadol, gave short rx and rec ibuprofen/tylenol  ROS:   Constitutional:  No f/c, No night sweats, No unexplained weight loss. EENT:  No vision changes, No blurry vision, No hearing changes. No mouth, throat, or ear problems.  Respiratory: No cough, No SOB Cardiac: No CP, no palpitations GI:  No abd pain, No N/V/D. GU: No Urinary s/sx Musculoskeletal: + L leg pain Neuro: No headache, no dizziness, no motor weakness.  Skin: No rash Endocrine:  No polydipsia. No polyuria.  Psych: Denies SI/HI  No problems updated.  ALLERGIES: Allergies  Allergen Reactions  . Diphenhydramine Hcl Itching  . Metoprolol Palpitations    PAST MEDICAL HISTORY: Past Medical History:  Diagnosis Date  . Asthma   . Bipolar 1 disorder (HCC)   . Chronic lumbar pain   . DDD (degenerative disc disease)   . Depression   . GERD (gastroesophageal reflux disease)   . History of gastric ulcer   . Hyperlipidemia   . Hypertension    . Migraine   . Osteoarthritis   . Right ACL tear   . Right knee meniscal tear   . Sciatica   . Wears glasses     MEDICATIONS AT HOME: Prior to Admission medications   Medication Sig Start Date End Date Taking? Authorizing Provider  amLODipine (NORVASC) 10 MG tablet Take 1 tablet (10 mg total) by mouth daily. 09/28/18   Marcine Matar, MD  amoxicillin-clavulanate (AUGMENTIN) 875-125 MG tablet Take 1 tablet by mouth every 12 (twelve) hours for 10 days. 10/06/18 10/16/18  Alvira Monday, MD  Ascorbic Acid (VITAMIN C PO) Take 1 tablet by mouth daily.    [provider]  benzonatate (TESSALON) 100 MG capsule Take 1 capsule (100 mg total) by mouth 2 (two) times daily as needed for cough. 09/28/18   Marcine Matar, MD  Cyanocobalamin (VITAMIN B-12 PO) Take 1 tablet by mouth daily.    [provider]  cyclobenzaprine (FLEXERIL) 5 MG tablet 1-2 at bedtime prn 10/10/18   Anders Simmonds, PA-C  diclofenac sodium (VOLTAREN) 1 % GEL Apply 2 g topically 4 (four) times daily. 08/23/18   Anders Simmonds, PA-C  hydrochlorothiazide (HYDRODIURIL) 12.5 MG tablet Take 1 tablet (12.5 mg total) by mouth daily. 09/28/18   Marcine Matar, MD  hydrOXYzine (ATARAX/VISTARIL) 25 MG tablet Take 1 tablet (25 mg total) by mouth at bedtime as needed. Patient taking differently: Take  25 mg by mouth at bedtime as needed (sleep).  07/19/18   Anders Simmonds, PA-C  ibuprofen (ADVIL,MOTRIN) 600 MG tablet Take 1 tablet (600 mg total) by mouth every 8 (eight) hours as needed. Patient taking differently: Take 600 mg by mouth every 8 (eight) hours as needed for moderate pain.  09/28/18   Marcine Matar, MD  Multiple Vitamin (MULTIVITAMIN) tablet Take 1 tablet by mouth daily.    [provider]  Omega-3 Fatty Acids (OMEGA-3 FISH OIL PO) Take 1 tablet by mouth daily.    [provider]  omeprazole (PRILOSEC) 20 MG capsule Take 1 capsule (20 mg total) by mouth daily. 10/10/18    Anders Simmonds, PA-C  potassium chloride (K-DUR) 10 MEQ tablet Take 1 tablet (10 mEq total) by mouth daily. 10/10/18   Anders Simmonds, PA-C  sertraline (ZOLOFT) 50 MG tablet Take 1 tablet (50 mg total) by mouth daily. 07/19/18   Anders Simmonds, PA-C  traMADol (ULTRAM) 50 MG tablet Take 1 tablet (50 mg total) by mouth every 6 (six) hours as needed. 10/06/18   Alvira Monday, MD     Objective:  EXAM:   Vitals:   10/10/18 1046  BP: (!) 136/93  Pulse: 76  Resp: 16  Temp: 98.1 F (36.7 C)  TempSrc: Oral  SpO2: 100%  Weight: 159 lb 3.2 oz (72.2 kg)    General appearance : A&OX3. NAD. Non-toxic-appearing HEENT: Atraumatic and Normocephalic.  PERRLA. EOM intact.  Neck: supple, no JVD. No cervical lymphadenopathy. No thyromegaly Chest/Lungs:  Breathing-non-labored, Good air entry bilaterally, breath sounds normal without rales, rhonchi, or wheezing  CVS: S1 S2 regular, no murmurs, gallops, rubs  L leg-no erythema of calf, neg Homan's sign. L foot with minimal edema.  Pulses good=B Extremities: Bilateral Lower Ext shows no edema, both legs are warm to touch with = pulse throughout Neurology:  CN II-XII grossly intact, Non focal.   Psych:  TP linear. J/I WNL. Normal speech. Appropriate eye contact and affect.  Skin:  No Rash  Data Review Lab Results  Component Value Date   HGBA1C 5.7 (H) 11/23/2017   HGBA1C 5.4 07/31/2016     Assessment & Plan   1. Pain of left calf R/o clot - VAS [place] LOWER EXTREMITY VENOUS (DVT); Future  2. Muscle spasm Can try increase dose at bedtime - cyclobenzaprine (FLEXERIL) 5 MG tablet; 1-2 at bedtime prn  Dispense: 30 tablet; Refill: 1  3. Hypokalemia - potassium chloride (K-DUR) 10 MEQ tablet; Take 1 tablet (10 mEq total) by mouth daily.  Dispense: 30 tablet; Refill: 2 - Basic metabolic panel  4. Smoker Cessation advised - VAS [place] LOWER EXTREMITY VENOUS (DVT); Future  5. Gastroesophageal reflux disease, esophagitis presence not  specified Can try- - omeprazole (PRILOSEC) 20 MG capsule; Take 1 capsule (20 mg total) by mouth daily.  Dispense: 30 capsule; Refill: 3   Patient have been counseled extensively about nutrition and exercise  Return for 5/22 appt with Dr Laural Benes;  sooner if needed.  The patient was given clear instructions to go to ER or return to medical center if symptoms don't improve, worsen or new problems develop. The patient verbalized understanding. The patient was told to call to get lab results if they haven't heard anything in the next week.     Georgian Co, PA-C [facility] and [facility] [place], [place] [phone]   10/10/2018, 11:02 AM

## 2018-10-10 ENCOUNTER — Ambulatory Visit (HOSPITAL_COMMUNITY): Payer: Self-pay

## 2018-10-10 ENCOUNTER — Telehealth: Payer: Self-pay | Admitting: Internal Medicine

## 2018-10-10 ENCOUNTER — Ambulatory Visit: Payer: Self-pay | Attending: Family Medicine | Admitting: Physician Assistant

## 2018-10-10 VITALS — BP 136/93 | HR 76 | Temp 98.1°F | Resp 16 | Wt 159.2 lb

## 2018-10-10 DIAGNOSIS — M62838 Other muscle spasm: Secondary | ICD-10-CM

## 2018-10-10 DIAGNOSIS — M79662 Pain in left lower leg: Secondary | ICD-10-CM

## 2018-10-10 DIAGNOSIS — F17200 Nicotine dependence, unspecified, uncomplicated: Secondary | ICD-10-CM

## 2018-10-10 DIAGNOSIS — K219 Gastro-esophageal reflux disease without esophagitis: Secondary | ICD-10-CM

## 2018-10-10 DIAGNOSIS — E876 Hypokalemia: Secondary | ICD-10-CM

## 2018-10-10 MED ORDER — POTASSIUM CHLORIDE ER 10 MEQ PO TBCR: 10.0000 meq | EXTENDED_RELEASE_TABLET | Freq: Every day | ORAL | 2 refills | Status: DC

## 2018-10-10 MED ORDER — OMEPRAZOLE 20 MG PO CPDR: 20.0000 mg | DELAYED_RELEASE_CAPSULE | Freq: Every day | ORAL | 3 refills | Status: DC

## 2018-10-10 MED ORDER — CYCLOBENZAPRINE HCL 5 MG PO TABS: ORAL_TABLET | ORAL | 1 refills | Status: DC

## 2018-10-10 MED FILL — ?CYCLOBENZAPRINE 5MG TABLET: 5 | 15 days supply | Qty: 30 | Fill #0

## 2018-10-10 MED FILL — POTASSIUM CHLORIDE ER 10 ME: 10 | 30 days supply | Qty: 30 | Fill #0

## 2018-10-10 MED FILL — ?OMEPRAZOLE 20 MG CAPSULE D: 20 | 30 days supply | Qty: 30 | Fill #0

## 2018-10-11 ENCOUNTER — Ambulatory Visit (HOSPITAL_COMMUNITY)
Admission: RE | Admit: 2018-10-11 | Discharge: 2018-10-11 | Disposition: A | Discharge status: Home / Self Care | Payer: Self-pay | Source: Ambulatory Visit | Attending: Physician Assistant | Admitting: Physician Assistant

## 2018-10-11 DIAGNOSIS — F17200 Nicotine dependence, unspecified, uncomplicated: Secondary | ICD-10-CM | POA: Insufficient documentation

## 2018-10-11 DIAGNOSIS — M79662 Pain in left lower leg: Secondary | ICD-10-CM | POA: Insufficient documentation

## 2018-10-11 LAB — BASIC METABOLIC PANEL
BUN/Creatinine Ratio: 17 (ref 9–23)
BUN: 15 mg/dL (ref 6–24)
CO2: 27 mmol/L (ref 20–29)
Calcium: 10.1 mg/dL (ref 8.7–10.2)
Chloride: 97 mmol/L (ref 96–106)
Creatinine, Ser: 0.89 mg/dL (ref 0.57–1.00)
GFR calc Af Amer: 85 mL/min/{1.73_m2} (ref >59)
GFR calc non Af Amer: 74 mL/min/{1.73_m2} (ref >59)
Glucose: 95 mg/dL (ref 65–99)
Potassium: 4.1 mmol/L (ref 3.5–5.2)
Sodium: 138 mmol/L (ref 134–144)

## 2018-10-11 NOTE — Progress Notes (Signed)
Left lower extremity venous duplex exam completed. Result attempted to call ordering physician's office, no answers.  More details please see preliminary notes on CV PROC under chart review. Alessander Sikorski H Anzlee Hinesley(RDMS RVT) 10/11/18 1:40 PM

## 2018-10-15 ENCOUNTER — Ambulatory Visit: Payer: Medicaid Other | Admitting: Podiatrist

## 2018-10-19 ENCOUNTER — Telehealth: Payer: Self-pay

## 2018-10-19 NOTE — Telephone Encounter (Signed)
Contacted pt to go over lab and DVT results. Pt is aware   Dr. Laural Benes pt states she is still having pain in b/l legs and the pain is getting worse. Pt states it feels like a pulled muscle. Pt states the lady told her after the scan it could be her sciatica. Pt is wanting to know what can she do for the pain or what can be done

## 2018-10-22 NOTE — Telephone Encounter (Signed)
Pt states she has been taking the tylenol and the ibuprofen and nothing seems to help. Pt states she is going to bed with the pain and she is waking up with the pain and it is a constant thing.  Pt states she has ulcers so she can't take a lot of medicine.

## 2018-10-23 NOTE — Telephone Encounter (Signed)
Please contact pt and schedule her an appointment.

## 2018-10-23 NOTE — Telephone Encounter (Signed)
Patient is on the walkin nest week

## 2018-10-29 ENCOUNTER — Ambulatory Visit: Payer: Medicaid Other

## 2018-10-31 ENCOUNTER — Ambulatory Visit: Payer: Self-pay | Attending: Family Medicine | Admitting: Physician Assistant

## 2018-10-31 ENCOUNTER — Other Ambulatory Visit: Payer: Self-pay

## 2018-10-31 VITALS — BP 124/91 | HR 77 | Temp 98.2°F | Resp 16 | Wt 169.2 lb

## 2018-10-31 DIAGNOSIS — F329 Major depressive disorder, single episode, unspecified: Secondary | ICD-10-CM | POA: Insufficient documentation

## 2018-10-31 DIAGNOSIS — E785 Hyperlipidemia, unspecified: Secondary | ICD-10-CM | POA: Insufficient documentation

## 2018-10-31 DIAGNOSIS — G5600 Carpal tunnel syndrome, unspecified upper limb: Secondary | ICD-10-CM | POA: Insufficient documentation

## 2018-10-31 DIAGNOSIS — I10 Essential (primary) hypertension: Secondary | ICD-10-CM | POA: Insufficient documentation

## 2018-10-31 DIAGNOSIS — Z791 Long term (current) use of non-steroidal anti-inflammatories (NSAID): Secondary | ICD-10-CM | POA: Insufficient documentation

## 2018-10-31 DIAGNOSIS — M1990 Unspecified osteoarthritis, unspecified site: Secondary | ICD-10-CM | POA: Insufficient documentation

## 2018-10-31 DIAGNOSIS — Z79899 Other long term (current) drug therapy: Secondary | ICD-10-CM | POA: Insufficient documentation

## 2018-10-31 DIAGNOSIS — M545 Low back pain, unspecified: Secondary | ICD-10-CM

## 2018-10-31 DIAGNOSIS — G8929 Other chronic pain: Secondary | ICD-10-CM | POA: Insufficient documentation

## 2018-10-31 DIAGNOSIS — J45909 Unspecified asthma, uncomplicated: Secondary | ICD-10-CM | POA: Insufficient documentation

## 2018-10-31 DIAGNOSIS — F10982 Alcohol use, unspecified with alcohol-induced sleep disorder: Secondary | ICD-10-CM

## 2018-10-31 DIAGNOSIS — K219 Gastro-esophageal reflux disease without esophagitis: Secondary | ICD-10-CM | POA: Insufficient documentation

## 2018-10-31 DIAGNOSIS — M62838 Other muscle spasm: Secondary | ICD-10-CM

## 2018-10-31 DIAGNOSIS — Z888 Allergy status to other drugs, medicaments and biological substances status: Secondary | ICD-10-CM | POA: Insufficient documentation

## 2018-10-31 DIAGNOSIS — G629 Polyneuropathy, unspecified: Secondary | ICD-10-CM

## 2018-10-31 MED ORDER — GABAPENTIN 300 MG PO CAPS: 300.0000 mg | ORAL_CAPSULE | Freq: Every day | ORAL | 3 refills | Status: DC

## 2018-10-31 MED ORDER — IBUPROFEN 800 MG PO TABS: 800.0000 mg | ORAL_TABLET | Freq: Three times a day (TID) | ORAL | 0 refills | Status: DC | PRN

## 2018-10-31 MED ORDER — PREDNISONE 10 MG PO TABS: ORAL_TABLET | ORAL | 0 refills | Status: DC

## 2018-10-31 MED ORDER — HYDROXYZINE HCL 25 MG PO TABS: 25.0000 mg | ORAL_TABLET | Freq: Every evening | ORAL | 3 refills | Status: DC | PRN

## 2018-10-31 MED FILL — ?OMEPRAZOLE 20 MG CAPSULE D: 20 | 30 days supply | Qty: 30 | Fill #1

## 2018-10-31 MED FILL — AMLODIPINE BESYLATE 10 MG T: 10 | 30 days supply | Qty: 30 | Fill #1

## 2018-10-31 MED FILL — HYDROCHLOROTHIAZIDE 12.5 MG: 12.5 | 30 days supply | Qty: 30 | Fill #1

## 2018-10-31 MED FILL — CYCLOBENZAPRINE 5 MG TABLET: 5 | 15 days supply | Qty: 30 | Fill #1

## 2018-10-31 MED FILL — predniSONE 10 MG TABS: 10 | 6 days supply | Qty: 21 | Fill #0

## 2018-10-31 MED FILL — POTASSIUM CHLORIDE ER 10 ME: 10 | 30 days supply | Qty: 30 | Fill #1

## 2018-10-31 NOTE — Progress Notes (Signed)
Patient ID: TRESSIA GOLIS, female   DOB: 1966/07/31, 53 y.o.   MRN: [id number]   Diandrea Timian, is a 53 y.o. female  TJQ:[id number]  [id number]  DOB - [birth year]  Subjective:  Chief Complaint and HPI: Myria Lorente is a 53 y.o. female here today for multiple issues.  She continues to have lower leg pain and burning in her feet.  Esp in the evenings and at night.  She used to be a heavy drinker but has been sober of alcohol and marijuana for 8 months.  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  Doppler studies were done for lower leg pain and were negative.    Also continues to have low back stiffness down into both legs past her knees.  Wants 800mg  ibuprofen to use occasionally for this.  Still has trouble with B carpal tunnel syndrome and hasn't been able to see hand surgeon yet due to financial constraints.    ROS:   Constitutional:  No f/c, No night sweats, No unexplained weight loss. EENT:  No vision changes, No blurry vision, No hearing changes. No mouth, throat, or ear problems.  Respiratory: No cough, No SOB Cardiac: No CP, no palpitations GI:  No abd pain, No N/V/D. GU: No Urinary s/sx Musculoskeletal: +see above Neuro: No headache, no dizziness, no motor weakness.  Skin: No rash Endocrine:  No polydipsia. No polyuria.  Psych: Denies SI/HI  No problems updated.  ALLERGIES: Allergies  Allergen Reactions  . Diphenhydramine Hcl Itching  . Metoprolol Palpitations    PAST MEDICAL HISTORY: Past Medical History:  Diagnosis Date  . Asthma   . Bipolar 1 disorder (HCC)   . Chronic lumbar pain   . DDD (degenerative disc disease)   . Depression   . GERD (gastroesophageal reflux disease)   . History of gastric ulcer   . Hyperlipidemia   . Hypertension   . Migraine   . Osteoarthritis   . Right ACL tear   . Right knee meniscal tear   . Sciatica   . Wears glasses     MEDICATIONS AT HOME: Prior to Admission medications   Medication Sig Start Date End Date  Taking? Authorizing Provider  amLODipine (NORVASC) 10 MG tablet Take 1 tablet (10 mg total) by mouth daily. 09/28/18   Marcine Matar, MD  Ascorbic Acid (VITAMIN C PO) Take 1 tablet by mouth daily.    [provider]  benzonatate (TESSALON) 100 MG capsule Take 1 capsule (100 mg total) by mouth 2 (two) times daily as needed for cough. Patient not taking: Reported on 10/31/2018 09/28/18   Marcine Matar, MD  Cyanocobalamin (VITAMIN B-12 PO) Take 1 tablet by mouth daily.    [provider]  cyclobenzaprine (FLEXERIL) 5 MG tablet 1-2 at bedtime prn 10/10/18   Anders Simmonds, PA-C  diclofenac sodium (VOLTAREN) 1 % GEL Apply 2 g topically 4 (four) times daily. 08/23/18   Anders Simmonds, PA-C  gabapentin (NEURONTIN) 300 MG capsule Take 1 capsule (300 mg total) by mouth at bedtime. 10/31/18   Anders Simmonds, PA-C  hydrochlorothiazide (HYDRODIURIL) 12.5 MG tablet Take 1 tablet (12.5 mg total) by mouth daily. 09/28/18   Marcine Matar, MD  hydrOXYzine (ATARAX/VISTARIL) 25 MG tablet Take 1 tablet (25 mg total) by mouth at bedtime as needed (sleep). 10/31/18   Anders Simmonds, PA-C  ibuprofen (ADVIL,MOTRIN) 800 MG tablet Take 1 tablet (800 mg total) by mouth every 8 (eight) hours as needed. Do not use until  you complete prednisone/use sparingly 10/31/18   Anders Simmonds, PA-C  Multiple Vitamin (MULTIVITAMIN) tablet Take 1 tablet by mouth daily.    [provider]  Omega-3 Fatty Acids (OMEGA-3 FISH OIL PO) Take 1 tablet by mouth daily.    [provider]  omeprazole (PRILOSEC) 20 MG capsule Take 1 capsule (20 mg total) by mouth daily. 10/10/18   Anders Simmonds, PA-C  potassium chloride (K-DUR) 10 MEQ tablet Take 1 tablet (10 mEq total) by mouth daily. 10/10/18   Anders Simmonds, PA-C  predniSONE (DELTASONE) 10 MG tablet 6,5,4,3,2,1 take each days dose in am with food 10/31/18   Georgian Co M, PA-C  sertraline (ZOLOFT) 50 MG tablet Take 1 tablet (50 mg  total) by mouth daily. 07/19/18   Anders Simmonds, PA-C  traMADol (ULTRAM) 50 MG tablet Take 1 tablet (50 mg total) by mouth every 6 (six) hours as needed. 10/06/18   Alvira Monday, MD     Objective:  EXAM:   Vitals:   10/31/18 1021  BP: (!) 124/91  Pulse: 77  Resp: 16  Temp: 98.2 F (36.8 C)  TempSrc: Oral  SpO2: 100%  Weight: 169 lb 3.2 oz (76.7 kg)    General appearance : A&OX3. NAD. Non-toxic-appearing HEENT: Atraumatic and Normocephalic.  PERRLA. EOM intact.   Neck: supple, no JVD. No cervical lymphadenopathy. No thyromegaly Chest/Lungs:  Breathing-non-labored, Good air entry bilaterally, breath sounds normal without rales, rhonchi, or wheezing  CVS: S1 S2 regular, no murmurs, gallops, rubs  Abdomen: Bowel sounds present, Non tender and not distended with no gaurding, rigidity or rebound. Extremities: Bilateral Lower Ext shows no edema, both legs are warm to touch with = pulse throughout Back exam-no changes-neg SLR B.   Neurology:  CN II-XII grossly intact, Non focal.   Psych:  TP linear. J/I WNL. Normal speech. Appropriate eye contact and affect.  Skin:  No Rash  Data Review Lab Results  Component Value Date   HGBA1C 5.7 (H) 11/23/2017   HGBA1C 5.4 07/31/2016     Assessment & Plan   1. Neuropathy Likely alcohol induced given history-congratulated on 8 months sober.  Also she stopped using marijuana about 8 months ago, so both likely masking her other aches and pains.  - predniSONE (DELTASONE) 10 MG tablet; 6,5,4,3,2,1 take each days dose in am with food  Dispense: 21 tablet; Refill: 0 - gabapentin (NEURONTIN) 300 MG capsule; Take 1 capsule (300 mg total) by mouth at bedtime.  Dispense: 90 capsule; Refill: 3  2. Alcohol-induced insomnia (HCC) Stable, improved since sober, but still takes for sleep sometimes - hydrOXYzine (ATARAX/VISTARIL) 25 MG tablet; Take 1 tablet (25 mg total) by mouth at bedtime as needed (sleep).  Dispense: 30 tablet; Refill: 3  3.  Muscle spasm No changes  4. Chronic midline low back pain without sciatica No red flags-needs to go for Xrays.  Prednisone can also help with CTS,  - Sedimentation Rate - DG Lumbar Spine Complete; Future - ibuprofen (ADVIL,MOTRIN) 800 MG tablet; Take 1 tablet (800 mg total) by mouth every 8 (eight) hours as needed. Do not use until you complete prednisone/use sparingly  Dispense: 60 tablet; Refill: 0   Patient have been counseled extensively about nutrition and exercise  Return for for 5/22 appt with Dr Laural Benes.  The patient was given clear instructions to go to ER or return to medical center if symptoms don't improve, worsen or new problems develop. The patient verbalized understanding. The patient was told to call  to get lab results if they haven't heard anything in the next week.     Georgian CoAngela Hilary Pundt, PA-C [facility] [place], [place] [phone]   10/31/2018, 10:45 AM

## 2018-11-01 LAB — SEDIMENTATION RATE: Sed Rate: 24 mm/hr (ref 0–40)

## 2018-11-12 ENCOUNTER — Ambulatory Visit: Payer: Medicaid Other

## 2018-11-14 ENCOUNTER — Telehealth: Payer: Self-pay

## 2018-11-14 NOTE — Telephone Encounter (Signed)
I called and left a message with a family member to have the patient call the office. Please let her know that we have canceled the NCV/EMG on 11/23/18 due to the covid 19 pandemic and that I will call in a couple of weeks to reschedule.

## 2018-11-21 ENCOUNTER — Telehealth: Payer: Self-pay | Admitting: Internal Medicine

## 2018-11-21 NOTE — Telephone Encounter (Signed)
New Message   Pt states that she would like to speak to a nurse regarding her  Gabapentin and Ibuprofen. Please f/u

## 2018-11-22 MED FILL — GABAPENTIN 300 MG CAPSULE: 300 | 90 days supply | Qty: 90 | Fill #0

## 2018-11-22 MED FILL — IBUPROFEN 800 MG TABLET: 800 | 20 days supply | Qty: 60 | Fill #0

## 2018-11-22 NOTE — Telephone Encounter (Signed)
Contacted pt and got her medication situated

## 2018-11-23 ENCOUNTER — Encounter: Payer: Medicaid Other | Admitting: Neurology

## 2018-11-28 ENCOUNTER — Ambulatory Visit: Payer: Self-pay | Attending: Internal Medicine | Admitting: Physician Assistant

## 2018-11-28 ENCOUNTER — Other Ambulatory Visit: Payer: Self-pay

## 2018-11-28 DIAGNOSIS — M6740 Ganglion, unspecified site: Secondary | ICD-10-CM

## 2018-11-28 DIAGNOSIS — G5601 Carpal tunnel syndrome, right upper limb: Secondary | ICD-10-CM

## 2018-11-28 DIAGNOSIS — G629 Polyneuropathy, unspecified: Secondary | ICD-10-CM

## 2018-11-28 MED ORDER — DICLOFENAC SODIUM 1 % TD GEL: 4.0000 g | Freq: Four times a day (QID) | TRANSDERMAL | 2 refills | Status: DC

## 2018-11-28 MED ORDER — ACETAMINOPHEN-CODEINE #3 300-30 MG PO TABS: 1.0000 | ORAL_TABLET | ORAL | 0 refills | Status: DC | PRN

## 2018-11-28 MED ORDER — GABAPENTIN 300 MG PO CAPS: 600.0000 mg | ORAL_CAPSULE | Freq: Every day | ORAL | 3 refills | Status: DC

## 2018-11-28 MED FILL — DICLOFENAC SODIUM 1% GEL: 1 | 6 days supply | Qty: 100 | Fill #0

## 2018-11-28 MED FILL — ACETAMINOPHEN/COD #3 TABLET: 300-30 | 5 days supply | Qty: 30 | Fill #0

## 2018-11-28 NOTE — Progress Notes (Signed)
Patient ID: [name] [name], female   DOB: Dec 17, 1965, 53 y.o.   MRN: [id number]      Virtual Visit via Telephone Note  I connected with [name] [name] on 11/28/18 at  2:30 PM EDT by telephone and verified that I am speaking with the correct person using two identifiers.   I discussed the limitations, risks, security and privacy concerns of performing an evaluation and management service by telephone and the availability of in person appointments. I also discussed with the patient that there may be a patient responsible charge related to this service. The patient expressed understanding and agreed to proceed.   History of Present Illness: Complains of B hand, knees, and feet pain for at least a week.  B knee pain is new.  Also feels like she has less strength in her hands.  Can't see a specialist currently bc no insurance.  Still not drinking.  Not using tramadol bc doesn't help.  Requesting something stronger.    No fever.  No tick bites.  NKI.    There is some rheumatoid arthritis in her family.    Observations/Objective:  TP linear.  Speech is clear   Assessment and Plan: 1. Neuropathy -increase dose - gabapentin (NEURONTIN) 300 MG capsule; Take 2 capsules (600 mg total) by mouth at bedtime.  Dispense: 90 capsule; Refill: 3 Tylenol #3 Rx #30-ues sparingly-counseled at length on this/chemical dependency issues/use sparingly  2. Carpal tunnel syndrome of right wrist Increase dose - diclofenac sodium (VOLTAREN) 1 % GEL; Apply 4 g topically 4 (four) times daily.  Dispense: 100 g; Refill: 2  3. Ganglion cyst - diclofenac sodium (VOLTAREN) 1 % GEL; Apply 4 g topically 4 (four) times daily.  Dispense: 100 g; Refill: 2   Follow Up Instructions: Keep f/up appt.     I discussed the assessment and treatment plan with the patient. The patient was provided an opportunity to ask questions and all were answered. The patient agreed with the plan and demonstrated an understanding of the  instructions.   The patient was advised to call back or seek an in-person evaluation if the symptoms worsen or if the condition fails to improve as anticipated.  I provided 13 minutes of non-face-to-face time during this encounter.   Georgian Co, PA-C

## 2018-12-28 ENCOUNTER — Ambulatory Visit: Payer: Medicaid Other | Admitting: Internal Medicine

## 2019-01-03 ENCOUNTER — Other Ambulatory Visit: Payer: Self-pay

## 2019-01-03 ENCOUNTER — Encounter: Payer: Self-pay | Admitting: Internal Medicine

## 2019-01-03 ENCOUNTER — Ambulatory Visit: Payer: Self-pay | Attending: Internal Medicine | Admitting: Internal Medicine

## 2019-01-03 DIAGNOSIS — M25561 Pain in right knee: Secondary | ICD-10-CM

## 2019-01-03 DIAGNOSIS — E669 Obesity, unspecified: Secondary | ICD-10-CM

## 2019-01-03 DIAGNOSIS — R7303 Prediabetes: Secondary | ICD-10-CM

## 2019-01-03 DIAGNOSIS — R600 Localized edema: Secondary | ICD-10-CM

## 2019-01-03 DIAGNOSIS — G5601 Carpal tunnel syndrome, right upper limb: Secondary | ICD-10-CM

## 2019-01-03 DIAGNOSIS — G629 Polyneuropathy, unspecified: Secondary | ICD-10-CM

## 2019-01-03 DIAGNOSIS — R059 Cough, unspecified: Secondary | ICD-10-CM

## 2019-01-03 DIAGNOSIS — I10 Essential (primary) hypertension: Secondary | ICD-10-CM

## 2019-01-03 DIAGNOSIS — F17200 Nicotine dependence, unspecified, uncomplicated: Secondary | ICD-10-CM

## 2019-01-03 DIAGNOSIS — R05 Cough: Secondary | ICD-10-CM

## 2019-01-03 NOTE — Progress Notes (Signed)
Virtual Visit via Telephone Note Due to current restrictions/limitations of in-office visits due to the COVID-19 pandemic, this scheduled clinical appointment was converted to a telehealth visit  I connected with [name] [name] on 01/03/19 at 10:15 a.m EDT by telephone and verified that I am speaking with the correct person using two identifiers. I am in my office.  The patient is at home.  Only the patient and myself participated in this encounter.  I discussed the limitations, risks, security and privacy concerns of performing an evaluation and management service by telephone and the availability of in person appointments. I also discussed with the patient that there may be a patient responsible charge related to this service. The patient expressed understanding and agreed to proceed.  History of Present Illness: Patient with history of HTN, tobdep, bipolar disorder, HL, chronic LBPwhich she attributes to arthritis, ETOHabuse in remission, ACD, marijuana in remission, GERD   Since last visit with me in February, patient has seen our PA several times with various concerns including pain and spasm in the left leg, burning pain in the feet, lower back stiffness and knee pain.  "I'm still in pain.  Nothing that you'll give me is helping."  CTS:  "my carpal tunnel is still acting up."  Has wrist splints and using them. Hurts the most in her thumbs.  Some swelling.   -taking Ibuprofen and Gabapentin "but they ain't doing no good though."   When I last saw her I had referred to neurology for nerve conduction study.  However looks like it was canceled due to the COVID pandemic. -Requesting hydrocodone for pain.  C/O pain in RT knee x 2 wks. worse when going up and down stairs in her split-level apartment.  Some swelling in the knee.  No known injury.  Lives in an apartment that has 2 levels on the second floor of her apartment building.  She would like to be able to get an apartment on the first  floor that does not have 2 levels.  Requesting a letter for that. -hx of arthroscopy surgery. -wgh 176 lbs up 7 lbs from last visit in 10/31/2018.  Eating more since COVID-19 pandemic  C/o dry cough x few days. No nasal congestion, rhinorrhea.  Throat a little sore from coughing.  No fever.  -little itchy throat and eyes. Not a lot of mucous at back of throat -still smoking about 4-5 cig/day.  Quit for 7 yrs at one time.  Not ready to quit -no recent sick contacts  Bottom and sides of feet swollen in mornings but get better during the day.  Voltaren gel helps sometimes.  Had complained of some pain in the lower legs and burning in the feet to the PA back in March.  She is known prediabetes based on A1c last year. HTN:  Compliant with meds that includes hydrochlorothiazide, amlodipine.  Limits alt in foods.    Outpatient Encounter Medications as of 01/03/2019  Medication Sig  . acetaminophen-codeine (TYLENOL #3) 300-30 MG tablet Take 1 tablet by mouth every 4 (four) hours as needed for moderate pain.  Marland Kitchen. amLODipine (NORVASC) 10 MG tablet Take 1 tablet (10 mg total) by mouth daily.  . Ascorbic Acid (VITAMIN C PO) Take 1 tablet by mouth daily.  . Cyanocobalamin (VITAMIN B-12 PO) Take 1 tablet by mouth daily.  . cyclobenzaprine (FLEXERIL) 5 MG tablet 1-2 at bedtime prn  . diclofenac sodium (VOLTAREN) 1 % GEL Apply 4 g topically 4 (four) times daily.  .Marland Kitchen  gabapentin (NEURONTIN) 300 MG capsule Take 2 capsules (600 mg total) by mouth at bedtime.  . hydrochlorothiazide (HYDRODIURIL) 12.5 MG tablet Take 1 tablet (12.5 mg total) by mouth daily.  . hydrOXYzine (ATARAX/VISTARIL) 25 MG tablet Take 1 tablet (25 mg total) by mouth at bedtime as needed (sleep).  Marland Kitchen ibuprofen (ADVIL,MOTRIN) 800 MG tablet Take 1 tablet (800 mg total) by mouth every 8 (eight) hours as needed. Do not use until you complete prednisone/use sparingly  . Multiple Vitamin (MULTIVITAMIN) tablet Take 1 tablet by mouth daily.  . Omega-3  Fatty Acids (OMEGA-3 FISH OIL PO) Take 1 tablet by mouth daily.  Marland Kitchen omeprazole (PRILOSEC) 20 MG capsule Take 1 capsule (20 mg total) by mouth daily.  . potassium chloride (K-DUR) 10 MEQ tablet Take 1 tablet (10 mEq total) by mouth daily.  . sertraline (ZOLOFT) 50 MG tablet Take 1 tablet (50 mg total) by mouth daily.  . benzonatate (TESSALON) 100 MG capsule Take 1 capsule (100 mg total) by mouth 2 (two) times daily as needed for cough. (Patient not taking: Reported on 10/31/2018)   No facility-administered encounter medications on file as of 01/03/2019.     Observations/Objective: No direct observation done as this was a telephone encounter. Weight is reported as 176 pounds putting her BMI above 30 Results for orders placed or performed in visit on 10/31/18  Sedimentation Rate  Result Value Ref Range   Sed Rate 24 0 - 40 mm/hr     Assessment and Plan: 1. Essential hypertension Continue current medications and low-salt diet.  2. Edema of both feet Patient advised that amlodipine can sometimes cause swelling in the lower extremities.  If the swelling in the feet is mainly in the mornings and goes away during the day, she is willing to tolerate staying on amlodipine.  3. Tobacco dependence Strongly advised to quit.  Discussed health risks associated with smoking.  Patient not ready to quit.  Less than 5 minutes spent on counseling.  4. Acute pain of right knee Given history of arthroscopic knee surgery and current symptoms, will get an x-ray and refer to orthopedics.  Continue nonsteroidal either as the diclofenac's gel or ibuprofen as needed.  Patient advised that we do not prescribe hydrocodone or Percocet in this practice -Letter written requesting management at her apartment complex to allow her to have an apartment that is 1 level and is on the first floor. - AMB referral to orthopedics - DG Knee Complete 4 Views Right; Future  5. Cough Likely due to allergies.  Recommend Claritin  over-the-counter.  Strongly advised to quit smoking  6. Prediabetes - Hemoglobin A1c; Future  7. Obesity (BMI 30-39.9) Dietary counseling given.  Advised to cut out sugary drinks and cut back on [place] carbohydrates.  Encouraged her to get out and walk several times a week  8. Neuropathy - Vitamin B12; Future  9. Carpal tunnel syndrome of right wrist - Ambulatory referral to Neurology   Follow Up Instructions: Follow-up in 3 months or sooner as needed.   I discussed the assessment and treatment plan with the patient. The patient was provided an opportunity to ask questions and all were answered. The patient agreed with the plan and demonstrated an understanding of the instructions.   The patient was advised to call back or seek an in-person evaluation if the symptoms worsen or if the condition fails to improve as anticipated.  I provided 25  minutes of non-face-to-face time during this encounter.   Jonah Blue, MD

## 2019-01-03 NOTE — Progress Notes (Signed)
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

## 2019-01-04 ENCOUNTER — Ambulatory Visit: Payer: Self-pay | Attending: Family Medicine

## 2019-01-04 DIAGNOSIS — E669 Obesity, unspecified: Secondary | ICD-10-CM | POA: Insufficient documentation

## 2019-01-04 DIAGNOSIS — R7303 Prediabetes: Secondary | ICD-10-CM | POA: Insufficient documentation

## 2019-01-04 DIAGNOSIS — G629 Polyneuropathy, unspecified: Secondary | ICD-10-CM

## 2019-01-05 LAB — HEMOGLOBIN A1C
Est. average glucose Bld gHb Est-mCnc: 120 mg/dL
Hgb A1c MFr Bld: 5.8 % — ABNORMAL HIGH (ref 4.8–5.6)

## 2019-01-05 LAB — VITAMIN B12: Vitamin B-12: 1398 pg/mL — ABNORMAL HIGH (ref 232–1245)

## 2019-01-09 ENCOUNTER — Telehealth: Payer: Self-pay | Admitting: Internal Medicine

## 2019-01-09 NOTE — Telephone Encounter (Signed)
-----   Message from Marcine Matar, MD sent at 01/06/2019  2:36 PM EDT ----- Let pt know that she is still in the range for pre-diabetes.  Healthy eating habits and regular exercise will help prevent progression to diabetes.  Vitamin B12 level is good.

## 2019-01-09 NOTE — Telephone Encounter (Signed)
Patient verified DOB Patient is aware of being prediabetic and needing to implement nutritional and physical changes to avoid progressing into diabetes. Patient is also aware of B12 being normal. Patient states she will complete knee and lumbar xrays next week

## 2019-01-09 NOTE — Telephone Encounter (Signed)
Patient called stating she would like to get her results. Please follow up °

## 2019-01-16 ENCOUNTER — Telehealth: Payer: Self-pay | Admitting: Internal Medicine

## 2019-01-16 NOTE — Telephone Encounter (Signed)
Patients call taken.  Patient identified by name and date of birth.  Patient complaining of rash with petichie on her forehead and extending doen to her checks.  Patient woke up with condition.  Patient endorses itching with rash and that it is spreading and has a fear that it will engulf her eyes.    Patient advise to use benedryl but states she is allergic to the medication.  Patient advised to go to Urgent Care or the Emergency Department to be assessed.  Patient acknowledged understanding of advice.

## 2019-01-16 NOTE — Telephone Encounter (Signed)
Patient called stating her face is breaking out and she is not sure what is causing it. Please follow up.

## 2019-01-17 ENCOUNTER — Ambulatory Visit: Payer: Self-pay | Attending: Internal Medicine | Admitting: Physician Assistant

## 2019-01-17 ENCOUNTER — Ambulatory Visit (HOSPITAL_COMMUNITY)
Admission: RE | Admit: 2019-01-17 | Discharge: 2019-01-17 | Disposition: A | Discharge status: Home / Self Care | Payer: Medicaid Other | Source: Ambulatory Visit | Attending: Physician Assistant | Admitting: Physician Assistant

## 2019-01-17 ENCOUNTER — Other Ambulatory Visit: Payer: Self-pay

## 2019-01-17 DIAGNOSIS — G629 Polyneuropathy, unspecified: Secondary | ICD-10-CM

## 2019-01-17 DIAGNOSIS — M545 Low back pain, unspecified: Secondary | ICD-10-CM

## 2019-01-17 DIAGNOSIS — T492X5A Adverse effect of local astringents and local detergents, initial encounter: Secondary | ICD-10-CM

## 2019-01-17 DIAGNOSIS — I10 Essential (primary) hypertension: Secondary | ICD-10-CM

## 2019-01-17 DIAGNOSIS — K219 Gastro-esophageal reflux disease without esophagitis: Secondary | ICD-10-CM

## 2019-01-17 DIAGNOSIS — G8929 Other chronic pain: Secondary | ICD-10-CM | POA: Insufficient documentation

## 2019-01-17 DIAGNOSIS — T7840XA Allergy, unspecified, initial encounter: Secondary | ICD-10-CM

## 2019-01-17 DIAGNOSIS — M25561 Pain in right knee: Secondary | ICD-10-CM | POA: Insufficient documentation

## 2019-01-17 DIAGNOSIS — M62838 Other muscle spasm: Secondary | ICD-10-CM

## 2019-01-17 DIAGNOSIS — R21 Rash and other nonspecific skin eruption: Secondary | ICD-10-CM

## 2019-01-17 DIAGNOSIS — E876 Hypokalemia: Secondary | ICD-10-CM

## 2019-01-17 MED ORDER — AMLODIPINE BESYLATE 10 MG PO TABS: 10.0000 mg | ORAL_TABLET | Freq: Every day | ORAL | 3 refills | Status: DC

## 2019-01-17 MED ORDER — CYCLOBENZAPRINE HCL 5 MG PO TABS: ORAL_TABLET | ORAL | 1 refills | Status: DC

## 2019-01-17 MED ORDER — POTASSIUM CHLORIDE ER 10 MEQ PO TBCR: 10.0000 meq | EXTENDED_RELEASE_TABLET | Freq: Every day | ORAL | 2 refills | Status: DC

## 2019-01-17 MED ORDER — HYDROCHLOROTHIAZIDE 12.5 MG PO TABS: 12.5000 mg | ORAL_TABLET | Freq: Every day | ORAL | 3 refills | Status: DC

## 2019-01-17 MED ORDER — OMEPRAZOLE 20 MG PO CPDR: 20.0000 mg | DELAYED_RELEASE_CAPSULE | Freq: Every day | ORAL | 3 refills | Status: DC

## 2019-01-17 MED ORDER — GABAPENTIN 300 MG PO CAPS: 600.0000 mg | ORAL_CAPSULE | Freq: Every day | ORAL | 3 refills | Status: DC

## 2019-01-17 MED ORDER — CETIRIZINE HCL 10 MG PO TABS: 10.0000 mg | ORAL_TABLET | Freq: Every day | ORAL | 11 refills | Status: DC

## 2019-01-17 MED FILL — POTASSIUM CHLORIDE ER 10 ME: 10 | 30 days supply | Qty: 30 | Fill #0

## 2019-01-17 MED FILL — CYCLOBENZAPRINE 5 MG TABLET: 5 | 15 days supply | Qty: 30 | Fill #0

## 2019-01-17 MED FILL — HYDROCHLOROTHIAZIDE 12.5 MG: 12.5 | 30 days supply | Qty: 30 | Fill #0

## 2019-01-17 MED FILL — ?CETIRIZINE HCL 10 MG TABLE: 10 | 30 days supply | Qty: 30 | Fill #0

## 2019-01-17 MED FILL — GABAPENTIN 300 MG CAPSULE: 300 | 30 days supply | Qty: 60 | Fill #0

## 2019-01-17 MED FILL — ?OMEPRAZOLE 20 MG CAPSULE D: 20 | 30 days supply | Qty: 30 | Fill #0

## 2019-01-17 MED FILL — ?AMLODIPINE BESYLATE 10 MG: 10 | 30 days supply | Qty: 30 | Fill #0

## 2019-01-17 NOTE — Progress Notes (Signed)
Virtual Visit via Telephone Note  I connected with [name] [name] on 01/17/19 at  9:30 AM EDT by telephone and verified that I am speaking with the correct person using two identifiers.   I discussed the limitations, risks, security and privacy concerns of performing an evaluation and management service by telephone and the availability of in person appointments. I also discussed with the patient that there may be a patient responsible charge related to this service. The patient expressed understanding and agreed to proceed.  Patient location:  Home  My Location:  [place] office Persons on the call:  Myself and the patient   History of Present Illness: 1-Needs medications RF. 2-Has pruritic rash on face that is slightly red and burning after using ne dryer sheets for her pillow cases.  This occurred a few days ago.  Hydrocortisone cream OTC is helping some.  No f/c.     Observations/Objective:  A&Ox3   Assessment and Plan: 1. Essential hypertension stable - hydrochlorothiazide (HYDRODIURIL) 12.5 MG tablet; Take 1 tablet (12.5 mg total) by mouth daily.  Dispense: 90 tablet; Refill: 3 - amLODipine (NORVASC) 10 MG tablet; Take 1 tablet (10 mg total) by mouth daily.  Dispense: 90 tablet; Refill: 3  2. Hypokalemia - potassium chloride (K-DUR) 10 MEQ tablet; Take 1 tablet (10 mEq total) by mouth daily.  Dispense: 30 tablet; Refill: 2  3. Neuropathy - gabapentin (NEURONTIN) 300 MG capsule; Take 2 capsules (600 mg total) by mouth at bedtime.  Dispense: 90 capsule; Refill: 3  4. Muscle spasm - cyclobenzaprine (FLEXERIL) 5 MG tablet; 1-2 at bedtime prn  Dispense: 30 tablet; Refill: 1  5. Gastroesophageal reflux disease, esophagitis presence not specified - omeprazole (PRILOSEC) 20 MG capsule; Take 1 capsule (20 mg total) by mouth daily.  Dispense: 30 capsule; Refill: 3  6. Allergic reaction, initial encounter --avoid dryer sheets that caused reaction.  OTC hydrocortisone is ok to use  sparingly tid X 5 days - cetirizine (ZYRTEC) 10 MG tablet; Take 1 tablet (10 mg total) by mouth daily.  Dispense: 30 tablet; Refill: 11    Follow Up Instructions: With PCP in 3 months;  Sooner if needed   I discussed the assessment and treatment plan with the patient. The patient was provided an opportunity to ask questions and all were answered. The patient agreed with the plan and demonstrated an understanding of the instructions.   The patient was advised to call back or seek an in-person evaluation if the symptoms worsen or if the condition fails to improve as anticipated.  I provided 7 minutes of non-face-to-face time during this encounter.   Freeman Caldron, PA-C  Patient ID: [name] [name], female   DOB: January 06, 1966, 53 y.o.   MRN: [id number]

## 2019-01-17 NOTE — Progress Notes (Signed)
Pt. Stated she have a red bump breakout some by her eyes on her cheeks.  She noticed it yesterday when she work up. She tried Hydrocortisone cream.  She described the bumps as burning on fire.

## 2019-01-18 ENCOUNTER — Telehealth: Payer: Self-pay | Admitting: Emergency Medicine

## 2019-01-18 NOTE — Telephone Encounter (Signed)
Nurse called the patient's home phone number but received no answer and message was left on the voicemail for the patient to call back.  Return phone number given. 

## 2019-01-18 NOTE — Telephone Encounter (Signed)
Patient called back to get their results. Please follow up.  °

## 2019-01-21 ENCOUNTER — Telehealth: Payer: Self-pay | Admitting: *Deleted

## 2019-01-21 ENCOUNTER — Telehealth: Payer: Self-pay | Admitting: Emergency Medicine

## 2019-01-21 ENCOUNTER — Telehealth: Payer: Self-pay

## 2019-01-21 NOTE — Telephone Encounter (Signed)
Contacted pt on Friday 6/12 to go over xray results pt didn't answer left a detailed vm informing pt of results and if she has any questions or concerns to give me a call

## 2019-01-21 NOTE — Telephone Encounter (Signed)
Pt name and DOB verified. Patient aware of results and result note per Dr. Wynetta Emery ( rt knee)  and Freeman Caldron, PA-C (  Lumbar Spine)    Pt request medication refill on ATB for tooth pain. She states the medication help and but since taking all medication the discomfort is returning. She was prescribed Augmentin- Clavulanate. Attempt to return call to schedule an appointment with PCP since no MD prescribed that medication/ ATB at this facility but in the ED. No answer. Unable to leave voicemail.

## 2019-01-21 NOTE — Telephone Encounter (Signed)
Patient contacted via phone to be given results of labs.  Patient identified by name and date of birth.  Patient given results of labs.  Patient educated on lab results. Questions answered. Patient acknowledged understanding of labs results. 

## 2019-01-22 ENCOUNTER — Telehealth: Payer: Self-pay | Admitting: Internal Medicine

## 2019-01-22 MED ORDER — PENICILLIN V POTASSIUM 250 MG PO TABS: 250.0000 mg | ORAL_TABLET | Freq: Four times a day (QID) | ORAL | 0 refills | Status: DC

## 2019-01-22 NOTE — Telephone Encounter (Signed)
Spoke with patient in person and convey information concerning her prescription

## 2019-01-22 NOTE — Telephone Encounter (Signed)
Patient called stating that he jaw keeps swelling up and that she would like to know what she can do because it is not getting better and states its worse than it was yesterday. Please follow up.

## 2019-01-22 NOTE — Addendum Note (Signed)
Addended by: Karle Plumber B on: 01/22/2019 01:50 PM   Modules accepted: Orders

## 2019-01-22 NOTE — Telephone Encounter (Signed)
Patient left message that prescription was sent to [facility] pharmacy.   Another attempt will be made later to get in touch with patient.

## 2019-01-22 NOTE — Telephone Encounter (Signed)
Patients call taken.  Patient identified by name and date of birth.  Patient states that her jaw has continued to swell.  Since she has seen Dr Wynetta Emery last her tooth has chipped off.  Patient sttes she has no dental insurance and can't go to the dentist.   Patient wished for Dr. Wynetta Emery to re prescribe antibiotic.  Patient advised that information would be passed along to Dr. Wynetta Emery and any advised would be forwarded to patient.  Patient acknowledged understanding of advice.

## 2019-02-01 MED FILL — PENICILLIN VK 500 MG TABLET: 500 | 7 days supply | Qty: 14 | Fill #0

## 2019-02-21 ENCOUNTER — Encounter: Payer: Medicaid Other | Admitting: Diagnostic Neuroimaging

## 2019-02-25 ENCOUNTER — Telehealth: Payer: Self-pay

## 2019-02-25 NOTE — Telephone Encounter (Signed)
02/25/19 LVM to r/s NCV / EMG study

## 2019-02-25 NOTE — Telephone Encounter (Signed)
-----   Message from Belinda Block, [place] sent at 02/25/2019 10:47 AM EDT ----- Regarding: reschedule I had to urgently cancel this patient last 2022-11-30 due to a death in the family, would you mind contacting her to reschedule. She can be put with any provider. Thank you so much!

## 2019-03-18 ENCOUNTER — Telehealth: Payer: Self-pay | Admitting: Internal Medicine

## 2019-03-18 NOTE — Telephone Encounter (Signed)
New Message  Pt is requesting a call back to discuss her paperwork because the pts father is no longer supporting her so she wants to know how to fill that part of the application out. Please f/u

## 2019-03-19 NOTE — Telephone Encounter (Signed)
I called Pt back unable to LVM since she does not have one set up

## 2019-04-05 ENCOUNTER — Ambulatory Visit: Payer: Medicaid Other | Admitting: Internal Medicine

## 2019-05-09 DIAGNOSIS — Z8616 Personal history of COVID-19: Secondary | ICD-10-CM

## 2019-05-09 HISTORY — DX: Personal history of COVID-19: Z86.16

## 2019-05-24 ENCOUNTER — Emergency Department (HOSPITAL_COMMUNITY)
Admission: EM | Admit: 2019-05-24 | Discharge: 2019-05-24 | Disposition: A | Discharge status: Home / Self Care | Payer: Self-pay | Attending: Emergency Medicine | Admitting: Emergency Medicine

## 2019-05-24 ENCOUNTER — Other Ambulatory Visit: Payer: Self-pay

## 2019-05-24 ENCOUNTER — Encounter (HOSPITAL_COMMUNITY): Payer: Self-pay

## 2019-05-24 ENCOUNTER — Emergency Department (HOSPITAL_COMMUNITY): Payer: Self-pay

## 2019-05-24 DIAGNOSIS — U071 COVID-19: Secondary | ICD-10-CM | POA: Insufficient documentation

## 2019-05-24 DIAGNOSIS — I10 Essential (primary) hypertension: Secondary | ICD-10-CM | POA: Insufficient documentation

## 2019-05-24 DIAGNOSIS — R059 Cough, unspecified: Secondary | ICD-10-CM

## 2019-05-24 DIAGNOSIS — F17210 Nicotine dependence, cigarettes, uncomplicated: Secondary | ICD-10-CM | POA: Insufficient documentation

## 2019-05-24 DIAGNOSIS — Z79899 Other long term (current) drug therapy: Secondary | ICD-10-CM | POA: Insufficient documentation

## 2019-05-24 DIAGNOSIS — R05 Cough: Secondary | ICD-10-CM

## 2019-05-24 DIAGNOSIS — J45909 Unspecified asthma, uncomplicated: Secondary | ICD-10-CM | POA: Insufficient documentation

## 2019-05-24 MED ORDER — FLUTICASONE PROPIONATE 50 MCG/ACT NA SUSP: 1.0000 | Freq: Every day | NASAL | 0 refills | Status: DC

## 2019-05-24 MED ORDER — TRIAMCINOLONE ACETONIDE 0.1 % EX CREA: 1.0000 "application " | TOPICAL_CREAM | Freq: Two times a day (BID) | CUTANEOUS | 0 refills | Status: DC

## 2019-05-24 MED ORDER — BENZONATATE 100 MG PO CAPS: 100.0000 mg | ORAL_CAPSULE | Freq: Three times a day (TID) | ORAL | 0 refills | Status: DC

## 2019-05-24 MED ORDER — ALBUTEROL SULFATE HFA 108 (90 BASE) MCG/ACT IN AERS
2.0000 | INHALATION_SPRAY | Freq: Once | RESPIRATORY_TRACT | Status: AC
  Administered 2019-05-24: 2 via RESPIRATORY_TRACT
  Filled 2019-05-24: qty 6.7

## 2019-05-24 NOTE — ED Triage Notes (Signed)
Patient c/o nasal congestion and a productive cough with brown sputum x 4 days. Patient states she has been drinking teas etc., but not any better. Patient denies any fever.  Patient also c/o rash on her left shoulder x 1 month.

## 2019-05-24 NOTE — ED Provider Notes (Signed)
[facility]-EMERGENCY DEPT Provider Note   CSN: [id number] Arrival date & time: 05/24/19  1036     History   Chief Complaint Chief Complaint  Patient presents with   Cough    HPI [name] [name] is a 53 y.o. female with a history of bipolar 1 disorder, asthma, depression, GERD, hypertension, & hyperlipidemia who presents to the ED w/ complaints of nasal congestion & cough x 4 days. Patient states she has had nasal congestion, sinus pressure, ear pain, cough productive of brown mucous sputum, & mild body aches. No alleviating/aggravating factors. No intervention PTA. No sick contacts w/ similar sxs. Denies fever, chills, chest pain, dyspnea, N/V/D, or abdominal pain. Also mentions pruritic rash that is well localized to the L upper back/shoulder area- no known new exposures, tick bites, or rashes in other locations.     HPI  Past Medical History:  Diagnosis Date   Asthma    Bipolar 1 disorder (HCC)    Chronic lumbar pain    DDD (degenerative disc disease)    Depression    GERD (gastroesophageal reflux disease)    History of gastric ulcer    Hyperlipidemia    Hypertension    Migraine    Osteoarthritis    Right ACL tear    Right knee meniscal tear    Sciatica    Wears glasses     Patient Active Problem List   Diagnosis Date Noted   Prediabetes 01/04/2019   Obesity (BMI 30-39.9) 01/04/2019   Muscle spasm 09/28/2018   Ganglion cyst 09/28/2018   Carpal tunnel syndrome of right wrist 09/28/2018   Chronic midline low back pain without sciatica 11/23/2017   Essential hypertension 11/23/2017   Chronic pain of right knee 11/23/2017   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   Marijuana abuse 11/23/2017   Tobacco dependence 11/23/2017   Alcohol abuse 07/30/2016   S/P ACL reconstruction 01/31/2014   DEPRESSION 11/17/2008   ALLERGIC RHINITIS 11/17/2008   GERD 11/17/2008    Past Surgical History:  Procedure  Laterality Date   ABDOMINAL HYSTERECTOMY     BUNIONECTOMY Left 2011   HEEL SPUR SURGERY Right 2012   KNEE ARTHROSCOPY WITH ANTERIOR CRUCIATE LIGAMENT (ACL) REPAIR WITH HAMSTRING GRAFT Right 01/31/2014   Procedure: RIGHT KNEE ARTHROSCOPY WITH ALLOGRAFT (ACL) ANTERIOR CRUCIATE LIGAMENT RECONSTRUCTON PARTIAL MENISCECTOMY VERSES REPAIR ;  Surgeon: Eugenia Mcalpine, MD;  Location: [facility];  Service: Orthopedics;  Laterality: Right;   TOTAL ABDOMINAL HYSTERECTOMY W/ BILATERAL SALPINGOOPHORECTOMY  04-20-2006     OB History   No obstetric history on file.      Home Medications    Prior to Admission medications   Medication Sig Start Date End Date Taking? Authorizing Provider  acetaminophen-codeine (TYLENOL #3) 300-30 MG tablet Take 1 tablet by mouth every 4 (four) hours as needed for moderate pain. 11/28/18   Anders Simmonds, PA-C  amLODipine (NORVASC) 10 MG tablet Take 1 tablet (10 mg total) by mouth daily. 01/17/19   Anders Simmonds, PA-C  Ascorbic Acid (VITAMIN C PO) Take 1 tablet by mouth daily.    [provider]  benzonatate (TESSALON) 100 MG capsule Take 1 capsule (100 mg total) by mouth 2 (two) times daily as needed for cough. Patient not taking: Reported on 10/31/2018 09/28/18   Marcine Matar, MD  cetirizine (ZYRTEC) 10 MG tablet Take 1 tablet (10 mg total) by mouth daily. 01/17/19   Anders Simmonds, PA-C  Cyanocobalamin (VITAMIN B-12 PO) Take 1 tablet  by mouth daily.    [provider]  cyclobenzaprine (FLEXERIL) 5 MG tablet 1-2 at bedtime prn 01/17/19   Argentina Donovan, PA-C  diclofenac sodium (VOLTAREN) 1 % GEL Apply 4 g topically 4 (four) times daily. 11/28/18   Argentina Donovan, PA-C  gabapentin (NEURONTIN) 300 MG capsule Take 2 capsules (600 mg total) by mouth at bedtime. 01/17/19   Argentina Donovan, PA-C  hydrochlorothiazide (HYDRODIURIL) 12.5 MG tablet Take 1 tablet (12.5 mg total) by mouth daily. 01/17/19   Argentina Donovan, PA-C    hydrOXYzine (ATARAX/VISTARIL) 25 MG tablet Take 1 tablet (25 mg total) by mouth at bedtime as needed (sleep). 10/31/18   Argentina Donovan, PA-C  ibuprofen (ADVIL,MOTRIN) 800 MG tablet Take 1 tablet (800 mg total) by mouth every 8 (eight) hours as needed. Do not use until you complete prednisone/use sparingly 10/31/18   Argentina Donovan, PA-C  Multiple Vitamin (MULTIVITAMIN) tablet Take 1 tablet by mouth daily.    [provider]  Omega-3 Fatty Acids (OMEGA-3 FISH OIL PO) Take 1 tablet by mouth daily.    [provider]  omeprazole (PRILOSEC) 20 MG capsule Take 1 capsule (20 mg total) by mouth daily. 01/17/19   Argentina Donovan, PA-C  penicillin v potassium (VEETID) 250 MG tablet Take 1 tablet (250 mg total) by mouth 4 (four) times daily. 01/22/19   Ladell Pier, MD  potassium chloride (K-DUR) 10 MEQ tablet Take 1 tablet (10 mEq total) by mouth daily. 01/17/19   Argentina Donovan, PA-C  sertraline (ZOLOFT) 50 MG tablet Take 1 tablet (50 mg total) by mouth daily. 07/19/18   Argentina Donovan, PA-C    Family History Family History  Problem Relation Age of Onset   Hypertension Father     Social History Social History   Tobacco Use   Smoking status: Current Every Day Smoker    Packs/day: 0.25    Years: 32.00    Pack years: 8.00    Types: Cigarettes   Smokeless tobacco: Never Used  Substance Use Topics   Alcohol use: Not Currently    Alcohol/week: 14.0 standard drinks    Types: 14 Cans of beer per week    Comment: 4 40 ounce beers and liquor   Drug use: Not Currently    Types: Marijuana    Comment: occasional  marijuana--  hx  "crack" use  last used 2004--  no treatment rehab (done on her own)     Allergies   Diphenhydramine hcl and Metoprolol   Review of Systems Review of Systems  Constitutional: Negative for chills and fever.  HENT: Positive for congestion, ear pain, rhinorrhea and sinus pressure. Negative for sore throat, trouble swallowing and  voice change.   Respiratory: Positive for cough. Negative for shortness of breath and wheezing.   Cardiovascular: Negative for chest pain.  Gastrointestinal: Negative for abdominal pain, diarrhea, nausea and vomiting.  Skin: Positive for rash.  Neurological: Negative for syncope.     Physical Exam Updated Vital Signs BP (!) 131/104 (BP Location: Left Arm)    Pulse 87    Temp 98.4 F (36.9 C) (Oral)    Resp 16    Ht 5\' 3"  (1.6 m)    Wt 79.8 kg    SpO2 98%    BMI 31.18 kg/m   Physical Exam Vitals signs and nursing note reviewed.  Constitutional:      General: She is not in acute distress.    Appearance: She is  well-developed.  HENT:     Head: Normocephalic and atraumatic.     Right Ear: Ear canal normal. Tympanic membrane is not perforated, erythematous, retracted or bulging.     Left Ear: Ear canal normal. Tympanic membrane is not perforated, erythematous, retracted or bulging.     Ears:     Comments: No mastoid erythema/swelling/tenderness.     Nose: Congestion present.     Right Sinus: No maxillary sinus tenderness or frontal sinus tenderness.     Left Sinus: No maxillary sinus tenderness or frontal sinus tenderness.     Mouth/Throat:     Pharynx: Uvula midline. No oropharyngeal exudate or posterior oropharyngeal erythema.     Comments: Posterior oropharynx is symmetric appearing. Patient tolerating own secretions without difficulty. No trismus. No drooling. No hot potato voice. No swelling beneath the tongue, submandibular compartment is soft.  Eyes:     General:        Right eye: No discharge.        Left eye: No discharge.     Conjunctiva/sclera: Conjunctivae normal.     Pupils: Pupils are equal, round, and reactive to light.  Neck:     Musculoskeletal: Normal range of motion and neck supple. No edema or neck rigidity.  Cardiovascular:     Rate and Rhythm: Normal rate and regular rhythm.     Heart sounds: No murmur.  Pulmonary:     Effort: Pulmonary effort is normal.  No respiratory distress.     Breath sounds: Normal breath sounds. No wheezing, rhonchi or rales.  Abdominal:     General: There is no distension.     Palpations: Abdomen is soft.     Tenderness: There is no abdominal tenderness.  Lymphadenopathy:     Cervical: No cervical adenopathy.  Skin:    General: Skin is warm and dry.     Findings: Rash (small papulovesiculo rash  well localized to the L trapezius area.  No surrounding erythema, crusting, or scaling. ) present.  Neurological:     Mental Status: She is alert.  Psychiatric:        Behavior: Behavior normal.     ED Treatments / Results  Labs (all labs ordered are listed, but only abnormal results are displayed) Labs Reviewed - No data to display  EKG None  Radiology No results found.  Procedures Procedures (including critical care time)  Medications Ordered in ED Medications - No data to display   Initial Impression / Assessment and Plan / ED Course  I have reviewed the triage vital signs and the nursing notes.  Pertinent labs & imaging results that were available during my care of the patient were reviewed by me and considered in my medical decision making (see chart for details).   Patient presents to the ED w/ cough & congestion. She is nontoxic appearing, in no apparent distress, vitals WNL with the exception of elevated diastolic BP- doubt HTN emergency. Afebrile, no sinus tenderness, doubt acute bacterial sinusitis. No signs of AOM/AOE/mastoidits. Oropharynx clear. Lungs CTA- no infiltrate on CXR- doubt pneumonia. No wheezing to indicate asthma exacerbation. Suspect viral vs. Allergic- will covid swab- discussed quarantine/hand hygiene. Tx supportively w/ flonase, tessalon, & provide inhaler to see if this helps. Rash of unclear etiology-  No concern for SJS, TEN, TSS, tick borne illness, syphilis or other life-threatening condition- trial topical steroids. I discussed results, treatment plan, need for follow-up, and  return precautions with the patient. Provided opportunity for questions, patient confirmed understanding and is  in agreement with plan.      Findings and plan of care discussed with supervising physician Dr. Renaye Rakersrifan who is in agreement.   Thomasene RippleStephanie L Toscano was evaluated in Emergency Department on 05/24/2019 for the symptoms described in the history of present illness. He/she was evaluated in the context of the global COVID-19 pandemic, which necessitated consideration that the patient might be at risk for infection with the SARS-CoV-2 virus that causes COVID-19. Institutional protocols and algorithms that pertain to the evaluation of patients at risk for COVID-19 are in a state of rapid change based on information released by regulatory bodies including the CDC and federal and state organizations. These policies and algorithms were followed during the patient's care in the ED.   Final Clinical Impressions(s) / ED Diagnoses   Final diagnoses:  Cough    ED Discharge Orders         Ordered    fluticasone (FLONASE) 50 MCG/ACT nasal spray  Daily     05/24/19 1213    benzonatate (TESSALON) 100 MG capsule  Every 8 hours     05/24/19 1213    triamcinolone cream (KENALOG) 0.1 %  2 times daily     05/24/19 1215           Cherly Andersonetrucelli, Calisa Luckenbaugh R, PA-C 05/24/19 1218    Terald Sleeperrifan, Matthew J, MD 05/24/19 1816

## 2019-05-24 NOTE — Discharge Instructions (Addendum)
You were seen in the emergency today for cough/congestion- we suspect your symptoms are related to allergies or a virus at this time.  We have prescribed you multiple medications to treat your symptoms.   -Flonase to be used 1 spray in each nostril daily.  This medication is used to treat your congestion.  -Tessalon can be taken once every 8 hours as needed.  This medication is used to treat your cough.  - Albuterol inhaler (sent home with you)-use 1 to 2 puffs as needed for cough/shortness of breath/ wheezing.  We have prescribed you new medication(s) today. Discuss the medications prescribed today with your pharmacist as they can have adverse effects and interactions with your other medicines including over the counter and prescribed medications. Seek medical evaluation if you start to experience new or abnormal symptoms after taking one of these medicines, seek care immediately if you start to experience difficulty breathing, feeling of your throat closing, facial swelling, or rash as these could be indications of a more serious allergic reaction  We have tested you for coronavirus- we will call you if results are positive- if you have coronavirus you will need to quarantine themselves for 14 days. You may be able to discontinue self quarantine if the following conditions are met:   Persons with COVID-19 who have symptoms and were directed to care for themselves at home may discontinue home isolation under the  following conditions: - It has been at least 7 days have passed since symptoms first appeared. - AND at least 3 days (72 hours) have passed since recovery defined as resolution of fever without the use of fever-reducing medications and improvement in respiratory symptoms (e.g., cough, shortness of breath)  Please follow the below quarantine instructions.   In regards to your rash please try applying triamcinolone cream twice per day to the affected area.  We provided information for  dermatologist in the area for follow-up.  Please follow up with primary care within 3-5 days for re-evaluation- call prior to going to the office to make them aware of your symptoms. Return to the ER for new or worsening symptoms including but not limited to increased work of breathing, fever, chest pain, passing out, or any other concerns.   [facility] D[facility]atologists:   [facility]  3.2 [phone])  Dermatologist  [place] [address] [place]  [phone]   Dr. Michelene Gardener, MD  2.6 [phone])  Dermatologist  [place]  [phone]  [facility]  3.5 (3)  [facility]  [place]  [phone]   [place]  4.0 (4)  Dermatologist  [place]  [phone]  Lavonna Monarch MD  3.0 (2)  Dermatologist  [place]  [phone]  Katrina Stack  2.7 (6)  Dermatologist  [place]  [phone]  [place] Amy Y MD  2.0 (1)  Dermatologist  [place]  [phone]  [place]  5.0 (3)  Doctor  [address]  [phone]         Person Under Monitoring Name: [name] [name]  Location: [place] [address]   Infection Prevention Recommendations for Individuals Confirmed to have, or Being Evaluated for, 2019 Novel Coronavirus (COVID-19) Infection Who Receive Care at Home  Individuals who are confirmed to have, or are being evaluated for, COVID-19 should follow the prevention steps below until a  healthcare provider or local or state health department says they can return to normal activities.  Stay home except to get medical care You should restrict activities outside your home, except for getting medical care. Do not go to work, school, or public areas, and do not use public transportation or taxis.  Call ahead before visiting your doctor Before your medical appointment, call the healthcare provider and tell them that  you have, or are being evaluated for, COVID-19 infection. This will help the healthcare providers office take steps to keep other people from getting infected. Ask your healthcare provider to call the local or state health department.  Monitor your symptoms Seek prompt medical attention if your illness is worsening (e.g., difficulty breathing). Before going to your medical appointment, call the healthcare provider and tell them that you have, or are being evaluated for, COVID-19 infection. Ask your healthcare provider to call the local or state health department.  Wear a facemask You should wear a facemask that covers your nose and mouth when you are in the same room with other people and when you visit a healthcare provider. People who live with or visit you should also wear a facemask while they are in the same room with you.  Separate yourself from other people in your home As much as possible, you should stay in a different room from other people in your home. Also, you should use a separate bathroom, if available.  Avoid sharing household items You should not share dishes, drinking glasses, cups, eating utensils, towels, bedding, or other items with other people in your home. After using these items, you should wash them thoroughly with soap and water.  Cover your coughs and sneezes Cover your mouth and nose with a tissue when you cough or sneeze, or you can cough or sneeze into your sleeve. Throw used tissues in a lined trash can, and immediately wash your hands with soap and water for at least 20 seconds or use an alcohol-based hand rub.  Wash your [facility] your hands often and thoroughly with soap and water for at least 20 seconds. You can use an alcohol-based hand sanitizer if soap and water are not available and if your hands are not visibly dirty. Avoid touching your eyes, nose, and mouth with unwashed hands.   Prevention Steps for Caregivers and Household Members  of Individuals Confirmed to have, or Being Evaluated for, COVID-19 Infection Being Cared for in the Home  If you live with, or provide care at home for, a person confirmed to have, or being evaluated for, COVID-19 infection please follow these guidelines to prevent infection:  Follow healthcare providers instructions Make sure that you understand and can help the patient follow any healthcare provider instructions for all care.  Provide for the patients basic needs You should help the patient with basic needs in the home and provide support for getting groceries, prescriptions, and other personal needs.  Monitor the patients symptoms If they are getting sicker, call his or her medical provider and tell them that the patient has, or is being evaluated for, COVID-19 infection. This will help the healthcare providers office take steps to keep other people from getting infected. Ask the healthcare provider to call the local or state health department.  Limit the number of people who have contact with the patient If possible, have only one caregiver for the patient. Other household members should stay in another home or place of residence. If this is not possible, they should  stay in another room, or be separated from the patient as much as possible. Use a separate bathroom, if available. Restrict visitors who do not have an essential need to be in the home.  Keep older adults, very young children, and other sick people away from the patient Keep older adults, very young children, and those who have compromised immune systems or chronic health conditions away from the patient. This includes people with chronic heart, lung, or kidney conditions, diabetes, and cancer.  Ensure good ventilation Make sure that shared spaces in the home have good air flow, such as from an air conditioner or an opened window, weather permitting.  Wash your hands often Wash your hands often and thoroughly with  soap and water for at least 20 seconds. You can use an alcohol based hand sanitizer if soap and water are not available and if your hands are not visibly dirty. Avoid touching your eyes, nose, and mouth with unwashed hands. Use disposable paper towels to dry your hands. If not available, use dedicated cloth towels and replace them when they become wet.  Wear a facemask and gloves Wear a disposable facemask at all times in the room and gloves when you touch or have contact with the patients blood, body fluids, and/or secretions or excretions, such as sweat, saliva, sputum, nasal mucus, vomit, urine, or feces.  Ensure the mask fits over your nose and mouth tightly, and do not touch it during use. Throw out disposable facemasks and gloves after using them. Do not reuse. Wash your hands immediately after removing your facemask and gloves. If your personal clothing becomes contaminated, carefully remove clothing and launder. Wash your hands after handling contaminated clothing. Place all used disposable facemasks, gloves, and other waste in a lined container before disposing them with other household waste. Remove gloves and wash your hands immediately after handling these items.  Do not share dishes, glasses, or other household items with the patient Avoid sharing household items. You should not share dishes, drinking glasses, cups, eating utensils, towels, bedding, or other items with a patient who is confirmed to have, or being evaluated for, COVID-19 infection. After the person uses these items, you should wash them thoroughly with soap and water.  Wash laundry thoroughly Immediately remove and wash clothes or bedding that have blood, body fluids, and/or secretions or excretions, such as sweat, saliva, sputum, nasal mucus, vomit, urine, or feces, on them. Wear gloves when handling laundry from the patient. Read and follow directions on labels of laundry or clothing items and detergent. In general,  wash and dry with the warmest temperatures recommended on the label.  Clean all areas the individual has used often Clean all touchable surfaces, such as counters, tabletops, doorknobs, bathroom fixtures, toilets, phones, keyboards, tablets, and bedside tables, every day. Also, clean any surfaces that may have blood, body fluids, and/or secretions or excretions on them. Wear gloves when cleaning surfaces the patient has come in contact with. Use a diluted bleach solution (e.g., dilute bleach with 1 part bleach and 10 parts water) or a household disinfectant with a label that says EPA-registered for coronaviruses. To make a bleach solution at home, add 1 tablespoon of bleach to 1 quart (4 cups) of water. For a larger supply, add  cup of bleach to 1 gallon (16 cups) of water. Read labels of cleaning products and follow recommendations provided on product labels. Labels contain instructions for safe and effective use of the cleaning product including precautions you should take when  applying the product, such as wearing gloves or eye protection and making sure you have good ventilation during use of the product. Remove gloves and wash hands immediately after cleaning.  Monitor yourself for signs and symptoms of illness Caregivers and household members are considered close contacts, should monitor their health, and will be asked to limit movement outside of the home to the extent possible. Follow the monitoring steps for close contacts listed on the symptom monitoring form.   ? If you have additional questions, contact your local health department or call the epidemiologist on call at [phone] (available 24/7). ? This guidance is subject to change. For the most up-to-date guidance from [facility], please refer to their website: [email or web address]

## 2019-05-25 LAB — NOVEL CORONAVIRUS, NAA (HOSP ORDER, SEND-OUT TO REF LAB; TAT 18-24 HRS): SARS-CoV-2, NAA: DETECTED — AB

## 2019-06-27 ENCOUNTER — Other Ambulatory Visit: Payer: Self-pay

## 2019-06-27 ENCOUNTER — Emergency Department (HOSPITAL_COMMUNITY)
Admission: EM | Admit: 2019-06-27 | Discharge: 2019-06-27 | Disposition: A | Discharge status: Home / Self Care | Payer: Medicaid Other | Attending: Emergency Medicine | Admitting: Emergency Medicine

## 2019-06-27 ENCOUNTER — Encounter (HOSPITAL_COMMUNITY): Payer: Self-pay

## 2019-06-27 DIAGNOSIS — F17210 Nicotine dependence, cigarettes, uncomplicated: Secondary | ICD-10-CM | POA: Insufficient documentation

## 2019-06-27 DIAGNOSIS — R519 Headache, unspecified: Secondary | ICD-10-CM | POA: Insufficient documentation

## 2019-06-27 DIAGNOSIS — Z79899 Other long term (current) drug therapy: Secondary | ICD-10-CM | POA: Insufficient documentation

## 2019-06-27 DIAGNOSIS — I10 Essential (primary) hypertension: Secondary | ICD-10-CM | POA: Insufficient documentation

## 2019-06-27 DIAGNOSIS — J45909 Unspecified asthma, uncomplicated: Secondary | ICD-10-CM | POA: Insufficient documentation

## 2019-06-27 MED ORDER — ACETAMINOPHEN 500 MG PO TABS: 500.0000 mg | ORAL_TABLET | Freq: Four times a day (QID) | ORAL | 0 refills | Status: AC | PRN

## 2019-06-27 MED ORDER — METOCLOPRAMIDE HCL 10 MG PO TABS
10.0000 mg | ORAL_TABLET | Freq: Once | ORAL | Status: AC
  Administered 2019-06-27: 10 mg via ORAL
  Filled 2019-06-27: qty 1

## 2019-06-27 MED ORDER — IBUPROFEN 800 MG PO TABS: 800.0000 mg | ORAL_TABLET | Freq: Three times a day (TID) | ORAL | 0 refills | Status: DC | PRN

## 2019-06-27 NOTE — ED Provider Notes (Signed)
[facility] DEPT Provider Note   CSN: [id number] Arrival date & time: 06/27/19  1006     History   Chief Complaint Chief Complaint  Patient presents with  . Headache    HPI [name] [name] is a 53 y.o. female.     The history is provided by the patient.  Headache Pain location:  Generalized Quality:  Dull Radiates to:  Does not radiate Severity currently:  2/10 Severity at highest:  7/10 Onset quality:  Gradual Timing:  Intermittent Progression:  Waxing and waning Chronicity:  Recurrent Similar to prior headaches: yes   Context: bright light   Relieved by:  NSAIDs, aspirin and acetaminophen Worsened by:  Light Associated symptoms: photophobia   Associated symptoms: no abdominal pain, no back pain, no blurred vision, no congestion, no cough, no diarrhea, no dizziness, no drainage, no ear pain, no eye pain, no fever, no numbness, no seizures, no sore throat, no vomiting and no weakness     Past Medical History:  Diagnosis Date  . Asthma   . Bipolar 1 disorder ([place])   . Chronic lumbar pain   . DDD (degenerative disc disease)   . Depression   . GERD (gastroesophageal reflux disease)   . History of gastric ulcer   . Hyperlipidemia   . Hypertension   . Migraine   . Osteoarthritis   . Right ACL tear   . Right knee meniscal tear   . Sciatica   . Wears glasses     Patient Active Problem List   Diagnosis Date Noted  . Prediabetes 01/04/2019  . Obesity (BMI 30-39.9) 01/04/2019  . Muscle spasm 09/28/2018  . Ganglion cyst 09/28/2018  . Carpal tunnel syndrome of right wrist 09/28/2018  . Chronic midline low back pain without sciatica 11/23/2017  . Essential hypertension 11/23/2017  . Chronic pain of right knee 11/23/2017  . 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 Marijuana abuse 11/23/2017  . Tobacco dependence 11/23/2017  . Alcohol abuse 07/30/2016  . S/P ACL reconstruction 01/31/2014  . DEPRESSION 11/17/2008  .  ALLERGIC RHINITIS 11/17/2008  . GERD 11/17/2008    Past Surgical History:  Procedure Laterality Date  . ABDOMINAL HYSTERECTOMY    . BUNIONECTOMY Left 2011  . HEEL SPUR SURGERY Right 2012  . KNEE ARTHROSCOPY WITH ANTERIOR CRUCIATE LIGAMENT (ACL) REPAIR WITH HAMSTRING GRAFT Right 01/31/2014   Procedure: RIGHT KNEE ARTHROSCOPY WITH ALLOGRAFT (ACL) ANTERIOR CRUCIATE LIGAMENT RECONSTRUCTON PARTIAL MENISCECTOMY VERSES REPAIR ;  Surgeon: Sydnee Cabal, MD;  Location: [facility];  Service: Orthopedics;  Laterality: Right;  . TOTAL ABDOMINAL HYSTERECTOMY W/ BILATERAL SALPINGOOPHORECTOMY  04-20-2006     OB History   No obstetric history on file.      Home Medications    Prior to Admission medications   Medication Sig Start Date End Date Taking? Authorizing Provider  acetaminophen (TYLENOL) 500 MG tablet Take 1 tablet (500 mg total) by mouth every 6 (six) hours as needed for up to 30 doses. 06/27/19   Falana Clagg, DO  acetaminophen-codeine (TYLENOL #3) 300-30 MG tablet Take 1 tablet by mouth every 4 (four) hours as needed for moderate pain. 11/28/18   Argentina Donovan, PA-C  amLODipine (NORVASC) 10 MG tablet Take 1 tablet (10 mg total) by mouth daily. 01/17/19   Argentina Donovan, PA-C  Ascorbic Acid (VITAMIN C PO) Take 1 tablet by mouth daily.    [provider]  benzonatate (TESSALON) 100 MG capsule Take 1 capsule (100 mg total) by  mouth every 8 (eight) hours. 05/24/19   Petrucelli, Samantha R, PA-C  cetirizine (ZYRTEC) 10 MG tablet Take 1 tablet (10 mg total) by mouth daily. 01/17/19   Anders SimmondsMcClung, Angela M, PA-C  Cyanocobalamin (VITAMIN B-12 PO) Take 1 tablet by mouth daily.    [provider]  cyclobenzaprine (FLEXERIL) 5 MG tablet 1-2 at bedtime prn 01/17/19   Anders SimmondsMcClung, Angela M, PA-C  diclofenac sodium (VOLTAREN) 1 % GEL Apply 4 g topically 4 (four) times daily. 11/28/18   Anders SimmondsMcClung, Angela M, PA-C  fluticasone (FLONASE) 50 MCG/ACT nasal spray Place 1 spray into  both nostrils daily. 05/24/19   Petrucelli, Samantha R, PA-C  gabapentin (NEURONTIN) 300 MG capsule Take 2 capsules (600 mg total) by mouth at bedtime. 01/17/19   Anders SimmondsMcClung, Angela M, PA-C  hydrochlorothiazide (HYDRODIURIL) 12.5 MG tablet Take 1 tablet (12.5 mg total) by mouth daily. 01/17/19   Anders SimmondsMcClung, Angela M, PA-C  hydrOXYzine (ATARAX/VISTARIL) 25 MG tablet Take 1 tablet (25 mg total) by mouth at bedtime as needed (sleep). 10/31/18   Anders SimmondsMcClung, Angela M, PA-C  ibuprofen (ADVIL) 800 MG tablet Take 1 tablet (800 mg total) by mouth every 8 (eight) hours as needed for up to 30 doses for headache. 06/27/19   Annete Ayuso, DO  Multiple Vitamin (MULTIVITAMIN) tablet Take 1 tablet by mouth daily.    [provider]  Omega-3 Fatty Acids (OMEGA-3 FISH OIL PO) Take 1 tablet by mouth daily.    [provider]  omeprazole (PRILOSEC) 20 MG capsule Take 1 capsule (20 mg total) by mouth daily. 01/17/19   Anders SimmondsMcClung, Angela M, PA-C  penicillin v potassium (VEETID) 250 MG tablet Take 1 tablet (250 mg total) by mouth 4 (four) times daily. 01/22/19   Marcine MatarJohnson, Deborah B, MD  potassium chloride (K-DUR) 10 MEQ tablet Take 1 tablet (10 mEq total) by mouth daily. 01/17/19   Anders SimmondsMcClung, Angela M, PA-C  sertraline (ZOLOFT) 50 MG tablet Take 1 tablet (50 mg total) by mouth daily. 07/19/18   Anders SimmondsMcClung, Angela M, PA-C  triamcinolone cream (KENALOG) 0.1 % Apply 1 application topically 2 (two) times daily. 05/24/19   Petrucelli, Pleas KochSamantha R, PA-C    Family History Family History  Problem Relation Age of Onset  . Hypertension Father     Social History Social History   Tobacco Use  . Smoking status: Current Every Day Smoker    Packs/day: 0.25    Years: 32.00    Pack years: 8.00    Types: Cigarettes  . Smokeless tobacco: Never Used  Substance Use Topics  . Alcohol use: Not Currently    Alcohol/week: 14.0 standard drinks    Types: 14 Cans of beer per week    Comment: 4 40 ounce beers and liquor  . Drug use: Not  Currently    Types: Marijuana    Comment: occasional  marijuana--  hx  "crack" use  last used 2004--  no treatment rehab (done on her own)     Allergies   Diphenhydramine hcl and Metoprolol   Review of Systems Review of Systems  Constitutional: Negative for chills and fever.  HENT: Negative for congestion, ear pain, postnasal drip and sore throat.   Eyes: Positive for photophobia. Negative for blurred vision, pain and visual disturbance.  Respiratory: Negative for cough and shortness of breath.   Cardiovascular: Negative for chest pain and palpitations.  Gastrointestinal: Negative for abdominal pain, diarrhea and vomiting.  Genitourinary: Negative for dysuria and hematuria.  Musculoskeletal: Negative for arthralgias, back pain and gait  problem.  Skin: Negative for color change and rash.  Neurological: Positive for headaches. Negative for dizziness, tremors, seizures, syncope, facial asymmetry, speech difficulty, weakness, light-headedness and numbness.  All other systems reviewed and are negative.    Physical Exam Updated Vital Signs  ED Triage Vitals [06/27/19 1044]  Enc Vitals Group     BP (!) 136/118     Pulse Rate 86     Resp 15     Temp 98.6 F (37 C)     Temp Source Oral     SpO2 98 %     Weight      Height      Head Circumference      Peak Flow      Pain Score      Pain Loc      Pain Edu?      Excl. in GC?     Physical Exam Vitals signs and nursing note reviewed.  Constitutional:      General: She is not in acute distress.    Appearance: She is well-developed.  HENT:     Head: Normocephalic and atraumatic.     Mouth/Throat:     Mouth: Mucous membranes are moist.  Eyes:     General: No visual field deficit.    Extraocular Movements: Extraocular movements intact.     Conjunctiva/sclera: Conjunctivae normal.  Neck:     Musculoskeletal: Normal range of motion and neck supple.  Cardiovascular:     Rate and Rhythm: Normal rate and regular rhythm.      Heart sounds: No murmur.  Pulmonary:     Effort: Pulmonary effort is normal. No respiratory distress.     Breath sounds: Normal breath sounds.  Abdominal:     Palpations: Abdomen is soft.     Tenderness: There is no abdominal tenderness.  Skin:    General: Skin is warm and dry.  Neurological:     Mental Status: She is alert and oriented to person, place, and time.     Cranial Nerves: No cranial nerve deficit.     Sensory: No sensory deficit.     Motor: No weakness.     Coordination: Coordination normal.     Comments: 5+ out of 5 strength throughout, normal sensation, no drift, normal speech  Psychiatric:        Mood and Affect: Mood normal.      ED Treatments / Results  Labs (all labs ordered are listed, but only abnormal results are displayed) Labs Reviewed - No data to display  EKG None  Radiology No results found.  Procedures Procedures (including critical care time)  Medications Ordered in ED Medications  metoCLOPramide (REGLAN) tablet 10 mg (has no administration in time range)     Initial Impression / Assessment and Plan / ED Course  I have reviewed the triage vital signs and the nursing notes.  Pertinent labs & imaging results that were available during my care of the patient were reviewed by me and considered in my medical decision making (see chart for details).        [name] [name] is a 53 year old female with history of asthma, bipolar disorder, headaches who presents to the ED with headache.  Patient with normal vitals.  No fever.  Patient with headaches on and off for the last several months.  Has a history of headaches.  States that she is tried multiple over-the-counter medications with relief most of the time.  States that she took extra strength Tylenol  prior to arrival and headache is almost gone at this time.  She has been unable to follow-up with her primary care doctor about these headaches as she currently does not have insurance.  Patient  has normal neurological exam.  No concern for meningitis.  No concern for acute intracranial process.  She has some photophobia at times.  These seem more consistent with migraine type headache/tension type headache.  Will give a dose of Reglan here for further symptomatic relief.  Recommend that she avoid Goody powders for her headaches which she appears to may be taking too often.  Recommend Motrin and Tylenol for headaches.  Given return precautions.  Recommend follow-up with wellness center.  Given return precautions and discharged from ED in good condition.  This chart was dictated using voice recognition software.  Despite best efforts to proofread,  errors can occur which can change the documentation meaning.    Final Clinical Impressions(s) / ED Diagnoses   Final diagnoses:  Nonintractable headache, unspecified chronicity pattern, unspecified headache type    ED Discharge Orders         Ordered    ibuprofen (ADVIL) 800 MG tablet  Every 8 hours PRN     06/27/19 1101    acetaminophen (TYLENOL) 500 MG tablet  Every 6 hours PRN     06/27/19 1101           Roselind Klus, DO 06/27/19 1102

## 2019-06-27 NOTE — Discharge Instructions (Addendum)
Please discontinue Goody powders for headaches.  Use 500 mg of Tylenol every 6 hours as needed for headaches.  Use 800 mg of Motrin every 8 hours as needed for headache

## 2019-06-27 NOTE — ED Notes (Signed)
Discharge instructions reviewed with patient. Patient verbalizes understanding. VSS.   

## 2019-06-27 NOTE — ED Triage Notes (Addendum)
Patient presents with headaches x6 months - patient states she "just wants to make sure everything is all right". Patient reports positive covid test 05/24/19. Patient AxOx4 in triage.

## 2019-07-14 ENCOUNTER — Other Ambulatory Visit: Payer: Self-pay

## 2019-07-14 ENCOUNTER — Encounter (HOSPITAL_COMMUNITY): Payer: Self-pay

## 2019-07-14 ENCOUNTER — Emergency Department (HOSPITAL_COMMUNITY): Payer: Self-pay

## 2019-07-14 ENCOUNTER — Emergency Department (HOSPITAL_COMMUNITY)
Admission: EM | Admit: 2019-07-14 | Discharge: 2019-07-14 | Disposition: A | Discharge status: Home / Self Care | Payer: Self-pay | Attending: Emergency Medicine | Admitting: Emergency Medicine

## 2019-07-14 DIAGNOSIS — L03211 Cellulitis of face: Secondary | ICD-10-CM | POA: Insufficient documentation

## 2019-07-14 DIAGNOSIS — F17210 Nicotine dependence, cigarettes, uncomplicated: Secondary | ICD-10-CM | POA: Insufficient documentation

## 2019-07-14 DIAGNOSIS — I10 Essential (primary) hypertension: Secondary | ICD-10-CM | POA: Insufficient documentation

## 2019-07-14 DIAGNOSIS — Z79899 Other long term (current) drug therapy: Secondary | ICD-10-CM | POA: Insufficient documentation

## 2019-07-14 DIAGNOSIS — J45909 Unspecified asthma, uncomplicated: Secondary | ICD-10-CM | POA: Insufficient documentation

## 2019-07-14 DIAGNOSIS — K0889 Other specified disorders of teeth and supporting structures: Secondary | ICD-10-CM

## 2019-07-14 LAB — CBC WITH DIFFERENTIAL/PLATELET
Abs Immature Granulocytes: 0.03 10*3/uL (ref 0.00–0.07)
Basophils Absolute: 0 10*3/uL (ref 0.0–0.1)
Basophils Relative: 0 %
Eosinophils Absolute: 0 10*3/uL (ref 0.0–0.5)
Eosinophils Relative: 0 %
HCT: 39.1 % (ref 36.0–46.0)
Hemoglobin: 12.6 g/dL (ref 12.0–15.0)
Immature Granulocytes: 0 %
Lymphocytes Relative: 27 %
Lymphs Abs: 2.4 10*3/uL (ref 0.7–4.0)
MCH: 29.9 pg (ref 26.0–34.0)
MCHC: 32.2 g/dL (ref 30.0–36.0)
MCV: 92.7 fL (ref 80.0–100.0)
Monocytes Absolute: 0.7 10*3/uL (ref 0.1–1.0)
Monocytes Relative: 7 %
Neutro Abs: 5.8 10*3/uL (ref 1.7–7.7)
Neutrophils Relative %: 66 %
Platelets: 357 10*3/uL (ref 150–400)
RBC: 4.22 MIL/uL (ref 3.87–5.11)
RDW: 13.1 % (ref 11.5–15.5)
WBC: 9 10*3/uL (ref 4.0–10.5)
nRBC: 0 % (ref 0.0–0.2)

## 2019-07-14 LAB — BASIC METABOLIC PANEL
Anion gap: 12 (ref 5–15)
BUN: 11 mg/dL (ref 6–20)
CO2: 25 mmol/L (ref 22–32)
Calcium: 9.5 mg/dL (ref 8.9–10.3)
Chloride: 104 mmol/L (ref 98–111)
Creatinine, Ser: 0.89 mg/dL (ref 0.44–1.00)
GFR calc Af Amer: >60 mL/min (ref >60)
GFR calc non Af Amer: >60 mL/min (ref >60)
Glucose, Bld: 95 mg/dL (ref 70–99)
Potassium: 3.7 mmol/L (ref 3.5–5.1)
Sodium: 141 mmol/L (ref 135–145)

## 2019-07-14 MED ORDER — AMOXICILLIN-POT CLAVULANATE 875-125 MG PO TABS: 1.0000 | ORAL_TABLET | Freq: Two times a day (BID) | ORAL | 0 refills | Status: DC

## 2019-07-14 MED ORDER — HYDROMORPHONE HCL 1 MG/ML IJ SOLN
0.5000 mg | Freq: Once | INTRAMUSCULAR | Status: AC
  Administered 2019-07-14: 0.5 mg via INTRAVENOUS
  Filled 2019-07-14: qty 1

## 2019-07-14 MED ORDER — AMOXICILLIN-POT CLAVULANATE 875-125 MG PO TABS: 1.0000 | ORAL_TABLET | Freq: Two times a day (BID) | ORAL | 0 refills | Status: AC

## 2019-07-14 MED ORDER — IOHEXOL 300 MG/ML  SOLN
100.0000 mL | Freq: Once | INTRAMUSCULAR | Status: AC | PRN
  Administered 2019-07-14: 100 mL via INTRAVENOUS

## 2019-07-14 MED ORDER — SODIUM CHLORIDE 0.9 % IV SOLN
1.5000 g | Freq: Once | INTRAVENOUS | Status: AC
  Administered 2019-07-14: 1.5 g via INTRAVENOUS
  Filled 2019-07-14 (×2): qty 4

## 2019-07-14 MED ORDER — ONDANSETRON HCL 4 MG/2ML IJ SOLN
4.0000 mg | Freq: Once | INTRAMUSCULAR | Status: AC
  Administered 2019-07-14: 4 mg via INTRAVENOUS
  Filled 2019-07-14: qty 2

## 2019-07-14 MED ORDER — MORPHINE SULFATE (PF) 4 MG/ML IV SOLN
4.0000 mg | Freq: Once | INTRAVENOUS | Status: AC
  Administered 2019-07-14: 4 mg via INTRAVENOUS
  Filled 2019-07-14: qty 1

## 2019-07-14 NOTE — ED Notes (Signed)
Pt transported to CT ?

## 2019-07-14 NOTE — Discharge Instructions (Addendum)
Today you were seen for dental pain.  If you start to experience and new or worsening symptoms return to the emergency department. If you start to experience fever, chills, neck stiffness/pain, or inability to move your neck or open your mouth come back to the emergency department immediately. If you begin to experience any blistering, rashes, swelling, or difficulty breathing seek medical care for evaluation of potentially more serious side effects.  Medications:  -I have prescribed you an antibiotic Augmentin to treat the infection and Naproxen which is an anti-inflammatory medicine to treat the pain.   -Continue usual home medications.  2. Follow Up: Use the resource guide listed below to help you find a dentist if you do not already have one to follow up with. It is very important that you get evaluated by a dentist as soon as possible. Call tomorrow to schedule an appointment. Read the instructions below.     Be sure to eat something when taking the Naproxen as it can cause stomach upset and at worst stomach bleeding. Do not take additional non steroidal anti-inflammatory medicines such as Ibuprofen, Aleve, Advil, Mobic, Diclofenac, or goodie powder while taking Naproxen. You may supplement with Tylenol.    Dental Care: Organization         Address  Phone  Notes  [facility] Department of [facility] [phone] Accepts children up to age 45 who are enrolled in [place] or [place]; pregnant women with a Medicaid card; and children who have applied for Medicaid or [place] Health Choice, but were declined, whose parents can pay a reduced fee at time of service.  [facility] Department of [facility]  [address], [place] [phone] Accepts children up to age 20 who are enrolled in [place] or [place]; pregnant women with a Medicaid card; and children who have applied for Medicaid or [place]  Health Choice, but were declined, whose parents can pay a reduced fee at time of service.  [facility]  [place] [phone] Patients are seen by appointment only. Walk-ins are not accepted. [place] will see patients 52 years of age and older. Monday - Tuesday (8am-5pm) Most Wednesdays (8:30-5pm) $30 per visit, cash only  [facility]  [address], [facility] [phone] Patients are seen by appointment only. Walk-ins are not accepted. [place] will see patients 60 years of age and older. One Wednesday Evening (Monthly: Volunteer Based).  $30 per visit, cash only  [place]  [phone] for adults; Children under age 50, call Graduate Pediatric Dentistry at [phone]. Children aged 76-14, please call [phone] to request a pediatric application.  Dental services are provided in all areas of dental care including fillings, crowns and bridges, complete and partial dentures, implants, gum treatment, root canals, and extractions. Preventive care is also provided. Treatment is provided to both adults and children. Patients are selected via a lottery and there is often a waiting list.   [facility] [address] [place]  [phone] [email or web address]   [facility] [address] [place], [place] [phone], Ext. [phone] Second and Fourth Thursday of each month, opens at 6:30 AM; Clinic ends at 9 AM.  Patients are seen on a first-come first-served basis, and a limited number are seen during each clinic.   [facility]  [address] [place], [place] [phone]   Eligibility Requirements You must have lived in [place], [place], or [place] counties for at least the last three months.   You cannot be eligible for state or federal sponsored [place], including [employer], [place], or [employer].    You generally cannot be eligible for healthcare insurance through your employer.    How to apply: Eligibility screenings are held every Tuesday and Wednesday afternoon from 1:00 pm until 4:00 pm. You do not need an appointment for the interview!  [facility] [address], [place], [place] [phone]   [facility] Department  [phone]   [facility] Department  [phone]   [facility]  [phone]    RESOURCE GUIDE:  Dental Problems  Patients with Medicaid: [facility] [phone] [address]                                [phone] [address] Phone:  [phone]                                                  Phone:  [phone]  If unable to pay or uninsured, contact:  [facility] or [facility]. to become qualified for the adult dental clinic.  Insufficient Money for Medicine Contact [employer]:  call "211" or [facility] [phone].  No Primary Care Doctor Call [facility]  [phone] Other agencies that provide inexpensive medical care    [facility]  [phone]    [facility]  [phone]    [facility]  [phone]    [facility] [place] [facility]  [phone]    [facility]  [phone]    [facility]  [phone]

## 2019-07-14 NOTE — ED Triage Notes (Signed)
Pt presents with dental pain on the left side of her mouth, also affecting her left ear. Pt reports the pain has been present for 3 days. Pt was positive for covid in October, has not since had a negative result.

## 2019-07-14 NOTE — ED Notes (Signed)
Pharmacy called for medication dose to be sent up

## 2019-07-14 NOTE — ED Provider Notes (Signed)
[facility]-EMERGENCY DEPT Provider Note   CSN: [id number] Arrival date & time: 07/14/19  1308     History   Chief Complaint Chief Complaint  Patient presents with  . Dental Pain    HPI [name] [name] is a 53 y.o. female past medical history significant for asthma, bipolar 1 disorder, degenerative disc disease, hypertension, lipidemia, migraines presents to emergency room today with chief complaint of dental pain x1 week.  Pain is located on the left side of her mouth sand radiates to left side of her neck.  She has noticed swelling on the left side of her face that has spread to her neck.  She is also endorsing fatigue, chills, and headache x3 days.  Headache has progressively worsened since onset and feels like headache she has had in the past. She is unable to tolerate p.o. intake because of pain.  She is spitting into a bag secondary to pain, states she can tolerate secretions. She has tried taking ibuprofen and Tylenol without symptom relief. She does not have dental insurance currently and has been unable to schedule a dental appointment.  Denies fever, voice change,  nausea/vomiting, dysphagia, odynophagia, drainage or trauma   Past Medical History:  Diagnosis Date  . Asthma   . Bipolar 1 disorder (HCC)   . Chronic lumbar pain   . DDD (degenerative disc disease)   . Depression   . GERD (gastroesophageal reflux disease)   . History of gastric ulcer   . Hyperlipidemia   . Hypertension   . Migraine   . Osteoarthritis   . Right ACL tear   . Right knee meniscal tear   . Sciatica   . Wears glasses     Patient Active Problem List   Diagnosis Date Noted  . Prediabetes 01/04/2019  . Obesity (BMI 30-39.9) 01/04/2019  . Muscle spasm 09/28/2018  . Ganglion cyst 09/28/2018  . Carpal tunnel syndrome of right wrist 09/28/2018  . Chronic midline low back pain without sciatica 11/23/2017  . Essential hypertension 11/23/2017  . Chronic pain of right knee  11/23/2017  . 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 Marijuana abuse 11/23/2017  . Tobacco dependence 11/23/2017  . Alcohol abuse 07/30/2016  . S/P ACL reconstruction 01/31/2014  . DEPRESSION 11/17/2008  . ALLERGIC RHINITIS 11/17/2008  . GERD 11/17/2008    Past Surgical History:  Procedure Laterality Date  . ABDOMINAL HYSTERECTOMY    . BUNIONECTOMY Left 2011  . HEEL SPUR SURGERY Right 2012  . KNEE ARTHROSCOPY WITH ANTERIOR CRUCIATE LIGAMENT (ACL) REPAIR WITH HAMSTRING GRAFT Right 01/31/2014   Procedure: RIGHT KNEE ARTHROSCOPY WITH ALLOGRAFT (ACL) ANTERIOR CRUCIATE LIGAMENT RECONSTRUCTON PARTIAL MENISCECTOMY VERSES REPAIR ;  Surgeon: Eugenia Mcalpine, MD;  [facility];  Service: Orthopedics;  Laterality: Right;  . TOTAL ABDOMINAL HYSTERECTOMY W/ BILATERAL SALPINGOOPHORECTOMY  04-20-2006     OB History   No obstetric history on file.      Home Medications    Prior to Admission medications   Medication Sig Start Date End Date Taking? Authorizing Provider  acetaminophen (TYLENOL) 500 MG tablet Take 1 tablet (500 mg total) by mouth every 6 (six) hours as needed for up to 30 doses. 06/27/19   Curatolo, Adam, DO  acetaminophen-codeine (TYLENOL #3) 300-30 MG tablet Take 1 tablet by mouth every 4 (four) hours as needed for moderate pain. 11/28/18   Anders Simmonds, PA-C  amLODipine (NORVASC) 10 MG tablet Take 1 tablet (10 mg total) by mouth daily. 01/17/19  Georgian Co M, PA-C  amoxicillin-clavulanate (AUGMENTIN) 875-125 MG tablet Take 1 tablet by mouth every 12 (twelve) hours for 10 days. 07/14/19 07/24/19  Loreena Valeri E, PA-C  Ascorbic Acid (VITAMIN C PO) Take 1 tablet by mouth daily.    [provider]  benzonatate (TESSALON) 100 MG capsule Take 1 capsule (100 mg total) by mouth every 8 (eight) hours. 05/24/19   Petrucelli, Samantha R, PA-C  cetirizine (ZYRTEC) 10 MG tablet Take 1 tablet (10 mg total) by mouth daily. 01/17/19   Anders Simmonds, PA-C  Cyanocobalamin (VITAMIN B-12 PO) Take 1 tablet by mouth daily.    [provider]  cyclobenzaprine (FLEXERIL) 5 MG tablet 1-2 at bedtime prn 01/17/19   Anders Simmonds, PA-C  diclofenac sodium (VOLTAREN) 1 % GEL Apply 4 g topically 4 (four) times daily. 11/28/18   Anders Simmonds, PA-C  fluticasone (FLONASE) 50 MCG/ACT nasal spray Place 1 spray into both nostrils daily. 05/24/19   Petrucelli, Samantha R, PA-C  gabapentin (NEURONTIN) 300 MG capsule Take 2 capsules (600 mg total) by mouth at bedtime. 01/17/19   Anders Simmonds, PA-C  hydrochlorothiazide (HYDRODIURIL) 12.5 MG tablet Take 1 tablet (12.5 mg total) by mouth daily. 01/17/19   Anders Simmonds, PA-C  hydrOXYzine (ATARAX/VISTARIL) 25 MG tablet Take 1 tablet (25 mg total) by mouth at bedtime as needed (sleep). 10/31/18   Anders Simmonds, PA-C  ibuprofen (ADVIL) 800 MG tablet Take 1 tablet (800 mg total) by mouth every 8 (eight) hours as needed for up to 30 doses for headache. 06/27/19   Curatolo, Adam, DO  Multiple Vitamin (MULTIVITAMIN) tablet Take 1 tablet by mouth daily.    [provider]  Omega-3 Fatty Acids (OMEGA-3 FISH OIL PO) Take 1 tablet by mouth daily.    [provider]  omeprazole (PRILOSEC) 20 MG capsule Take 1 capsule (20 mg total) by mouth daily. 01/17/19   Anders Simmonds, PA-C  penicillin v potassium (VEETID) 250 MG tablet Take 1 tablet (250 mg total) by mouth 4 (four) times daily. 01/22/19   Marcine Matar, MD  potassium chloride (K-DUR) 10 MEQ tablet Take 1 tablet (10 mEq total) by mouth daily. 01/17/19   Anders Simmonds, PA-C  sertraline (ZOLOFT) 50 MG tablet Take 1 tablet (50 mg total) by mouth daily. 07/19/18   Anders Simmonds, PA-C  triamcinolone cream (KENALOG) 0.1 % Apply 1 application topically 2 (two) times daily. 05/24/19   Petrucelli, Pleas Koch, PA-C    Family History Family History  Problem Relation Age of Onset  . Hypertension Father     Social  History Social History   Tobacco Use  . Smoking status: Current Every Day Smoker    Packs/day: 0.25    Years: 32.00    Pack years: 8.00    Types: Cigarettes  . Smokeless tobacco: Never Used  Substance Use Topics  . Alcohol use: Not Currently    Alcohol/week: 14.0 standard drinks    Types: 14 Cans of beer per week    Comment: 4 40 ounce beers and liquor  . Drug use: Not Currently    Types: Marijuana    Comment: occasional  marijuana--  hx  "crack" use  last used 2004--  no treatment rehab (done on her own)     Allergies   Diphenhydramine hcl and Metoprolol   Review of Systems Review of Systems All other systems are reviewed and are negative for acute change except as noted in the  HPI.   Physical Exam Updated Vital Signs BP (!) 157/111 (BP Location: Left Arm)   Pulse 90   Temp 99.3 F (37.4 C) (Oral)   Resp 18   SpO2 99%   Physical Exam Vitals signs and nursing note reviewed.  Constitutional:      General: She is not in acute distress.    Appearance: She is not ill-appearing.  HENT:     Head: Normocephalic and atraumatic.     Comments: No sinus or temporal tenderness.    Right Ear: Tympanic membrane and external ear normal. No mastoid tenderness. Tympanic membrane is not erythematous or bulging.     Left Ear: Tympanic membrane and external ear normal. No mastoid tenderness. Tympanic membrane is not erythematous or bulging.     Nose: Nose normal.     Mouth/Throat:     Mouth: Mucous membranes are moist.     Pharynx: Oropharynx is clear.     Comments: Poor dentition with multiple caries seen.  Left mandible with tenderness and swelling.  No signs of obvious gross abscess. Tolerating oral secretions. No trismus. She is speaking in full sentences. Eyes:     General: No scleral icterus.       Right eye: No discharge.        Left eye: No discharge.     Conjunctiva/sclera: Conjunctivae normal.  Neck:     Vascular: No JVD.     Comments: Full range of motion of neck.   She has pain when rotating to the left. Cardiovascular:     Rate and Rhythm: Normal rate and regular rhythm.     Pulses: Normal pulses.          Radial pulses are 2+ on the right side and 2+ on the left side.     Heart sounds: Normal heart sounds.  Pulmonary:     Comments: Lungs clear to auscultation in all fields. Symmetric chest rise. No wheezing, rales, or rhonchi. Abdominal:     Comments: Abdomen is soft, non-distended, and non-tender in all quadrants. No rigidity, no guarding. No peritoneal signs.  Musculoskeletal: Normal range of motion.  Skin:    General: Skin is warm and dry.     Capillary Refill: Capillary refill takes less than 2 seconds.     Coloration: Pallor:    Neurological:     Mental Status: She is oriented to person, place, and time.     GCS: GCS eye subscore is 4. GCS verbal subscore is 5. GCS motor subscore is 6.     Comments: Fluent speech, no facial droop.  Psychiatric:        Behavior: Behavior normal.      ED Treatments / Results  Labs (all labs ordered are listed, but only abnormal results are displayed) Labs Reviewed  CBC WITH DIFFERENTIAL/PLATELET  BASIC METABOLIC PANEL    EKG None  Radiology Ct Soft Tissue Neck W Contrast  Result Date: 07/14/2019 CLINICAL DATA:  Left-sided dental and facial pain over the last 3 days. Recent coronavirus infection. EXAM: CT NECK WITH CONTRAST TECHNIQUE: Multidetector CT imaging of the neck was performed using the standard protocol following the bolus administration of intravenous contrast. CONTRAST:  169mL OMNIPAQUE IOHEXOL 300 MG/ML  SOLN COMPARISON:  10/06/2018 FINDINGS: Pharynx and larynx: No mucosal or submucosal lesion. Salivary glands: Parotid and submandibular glands are normal. Thyroid: Normal Lymph nodes: No enlarged or low-density nodes on either side of the neck. Mild reactive prominence of level 2 and level 3 nodes on the  left. Vascular: Normal Limited intracranial: Normal Visualized orbits: Normal Mastoids  and visualized paranasal sinuses: Odontogenic mucosal thickening at the floor of the left maxillary sinus. Skeleton: Extensive dental and periodontal disease. Periapical abscess of the left maxillary molar. Developing periapical focal lucency of the left mandibular molar. Chronic dental decay and radicular cyst of the right mandibular molar. Upper chest: Negative Other: Left facial soft tissue swelling consistent with facial cellulitis. No sign of drainable soft tissue abscess. IMPRESSION: Nonspecific left facial swelling consistent with left facial cellulitis. No evidence of drainable soft tissue abscess. Source of the infection could be dental and periodontal disease particularly with periapical cyst formation at the left maxillary molar and to a lesser extent the left mandibular molar. Odontogenic inflammation of the mucosa of floor of the left maxillary sinus also noted. Electronically Signed   By: Paulina Fusi M.D.   On: 07/14/2019 13:38    Procedures Procedures (including critical care time)  Medications Ordered in ED Medications  morphine 4 MG/ML injection 4 mg (4 mg Intravenous Given 07/14/19 1216)  ondansetron (ZOFRAN) injection 4 mg (4 mg Intravenous Given 07/14/19 1216)  iohexol (OMNIPAQUE) 300 MG/ML solution 100 mL (100 mLs Intravenous Contrast Given 07/14/19 1310)  ampicillin-sulbactam (UNASYN) 1.5 g in sodium chloride 0.9 % 100 mL IVPB (0 g Intravenous Stopped 07/14/19 1502)  HYDROmorphone (DILAUDID) injection 0.5 mg (0.5 mg Intravenous Given 07/14/19 1513)     Initial Impression / Assessment and Plan / ED Course  I have reviewed the triage vital signs and the nursing notes.  Pertinent labs & imaging results that were available during my care of the patient were reviewed by me and considered in my medical decision making (see chart for details).  Patient seen and examined.  She has history of multiple dental infections with an admission in the past concerning for spread of infection to  retropharyngeal space.  On arrival she has low-grade temp of 99.3.  No hypoxia or tachycardia.  She has swelling to the left side of her face that is tender to palpation and has pain with extension of her neck.  No gross dental abscess seen. No evidence of strep pharyngitis or PTA.  Given her concerning history of retropharyngeal abscess will obtain CT neck for further evaluation.  Labs today with no leukocytosis, no anemia no severe electrolyte derangement, no renal insufficiency. CT soft tissue neck shows left facial cellulitis without obvious drainable abscess Radiologist comments source could be  periapical cyst formation at the left maxillary molar and to a lesser extent the left mandibular molar. IV Unasyn to cover dental infection. Pain medicine given and patient feels she can manage her symptoms at home. She is tolerating PO intake while in the ED. I rechecked her vitals prior to discharge, temperature 98.6, BP 148/96 with HR 88. Patient is stable to discharge home. Recommend ibuprofen for pain. Will discharge home with antibiotic Augmentin and recommend dentist follow up. Patient given dental resource list.  The patient appears reasonably screened and/or stabilized for discharge and I doubt any other medical condition or other [facility] requiring further screening, evaluation, or treatment in the ED at this time prior to discharge. The patient is safe for discharge with strict return precautions discussed.    Portions of this note were generated with Scientist, clinical (histocompatibility and immunogenetics). Dictation errors may occur despite best attempts at proofreading.   Final Clinical Impressions(s) / ED Diagnoses   Final diagnoses:  Facial cellulitis  Pain, dental    ED Discharge Orders  Ordered    amoxicillin-clavulanate (AUGMENTIN) 875-125 MG tablet  Every 12 hours,   Status:  Discontinued     07/14/19 1522    amoxicillin-clavulanate (AUGMENTIN) 875-125 MG tablet  Every 12 hours     07/14/19 1545            Job Holtsclaw, Caroleen HammanKaitlyn E, PA-C 07/14/19 1619    Terrilee FilesButler, Michael C, MD 07/14/19 [phone]

## 2019-07-15 MED FILL — AMOX-CLAV 875-125 MG TABLET: 875-125 | 10 days supply | Qty: 20 | Fill #0

## 2019-08-07 ENCOUNTER — Ambulatory Visit: Payer: Medicaid Other

## 2019-08-13 ENCOUNTER — Ambulatory Visit: Payer: Medicaid Other | Attending: Internal Medicine

## 2019-08-13 ENCOUNTER — Other Ambulatory Visit: Payer: Self-pay

## 2019-08-22 ENCOUNTER — Ambulatory Visit: Payer: Self-pay | Attending: Internal Medicine | Admitting: Physician Assistant

## 2019-08-22 ENCOUNTER — Other Ambulatory Visit: Payer: Self-pay

## 2019-08-22 DIAGNOSIS — I10 Essential (primary) hypertension: Secondary | ICD-10-CM

## 2019-08-22 DIAGNOSIS — E876 Hypokalemia: Secondary | ICD-10-CM

## 2019-08-22 DIAGNOSIS — K047 Periapical abscess without sinus: Secondary | ICD-10-CM

## 2019-08-22 DIAGNOSIS — M545 Low back pain, unspecified: Secondary | ICD-10-CM

## 2019-08-22 DIAGNOSIS — G8929 Other chronic pain: Secondary | ICD-10-CM

## 2019-08-22 DIAGNOSIS — M62838 Other muscle spasm: Secondary | ICD-10-CM

## 2019-08-22 DIAGNOSIS — F10982 Alcohol use, unspecified with alcohol-induced sleep disorder: Secondary | ICD-10-CM

## 2019-08-22 DIAGNOSIS — H539 Unspecified visual disturbance: Secondary | ICD-10-CM

## 2019-08-22 DIAGNOSIS — M79671 Pain in right foot: Secondary | ICD-10-CM

## 2019-08-22 DIAGNOSIS — G629 Polyneuropathy, unspecified: Secondary | ICD-10-CM

## 2019-08-22 MED ORDER — DICLOFENAC SODIUM 1 % EX GEL: 2.0000 g | Freq: Four times a day (QID) | CUTANEOUS | 2 refills | Status: DC

## 2019-08-22 MED ORDER — SERTRALINE HCL 50 MG PO TABS: 50.0000 mg | ORAL_TABLET | Freq: Every day | ORAL | 1 refills | Status: DC

## 2019-08-22 MED ORDER — POTASSIUM CHLORIDE ER 10 MEQ PO TBCR: 10.0000 meq | EXTENDED_RELEASE_TABLET | Freq: Every day | ORAL | 2 refills | Status: DC

## 2019-08-22 MED ORDER — AMLODIPINE BESYLATE 10 MG PO TABS: 10.0000 mg | ORAL_TABLET | Freq: Every day | ORAL | 3 refills | Status: DC

## 2019-08-22 MED ORDER — IBUPROFEN 800 MG PO TABS: 800.0000 mg | ORAL_TABLET | Freq: Three times a day (TID) | ORAL | 0 refills | Status: DC | PRN

## 2019-08-22 MED ORDER — GABAPENTIN 300 MG PO CAPS: 600.0000 mg | ORAL_CAPSULE | Freq: Every day | ORAL | 3 refills | Status: DC

## 2019-08-22 MED ORDER — HYDROCHLOROTHIAZIDE 12.5 MG PO TABS: 12.5000 mg | ORAL_TABLET | Freq: Every day | ORAL | 3 refills | Status: DC

## 2019-08-22 MED ORDER — PENICILLIN V POTASSIUM 250 MG PO TABS: 250.0000 mg | ORAL_TABLET | Freq: Four times a day (QID) | ORAL | 0 refills | Status: DC

## 2019-08-22 MED ORDER — CYCLOBENZAPRINE HCL 5 MG PO TABS: ORAL_TABLET | ORAL | 1 refills | Status: DC

## 2019-08-22 MED ORDER — FLUTICASONE PROPIONATE 50 MCG/ACT NA SUSP: 1.0000 | Freq: Every day | NASAL | 0 refills | Status: DC

## 2019-08-22 MED ORDER — HYDROXYZINE HCL 25 MG PO TABS: 25.0000 mg | ORAL_TABLET | Freq: Every evening | ORAL | 3 refills | Status: DC | PRN

## 2019-08-22 MED FILL — ?PENICILLIN VK 500 MG TABLE: 500 | 7 days supply | Qty: 14 | Fill #0

## 2019-08-22 MED FILL — DICLOFENAC SODIUM 1% GEL: 1 | 16 days supply | Qty: 100 | Fill #0

## 2019-08-22 MED FILL — hydrOXYzine HCL 25 MG TABS: 25 | 30 days supply | Qty: 30 | Fill #0

## 2019-08-22 MED FILL — SERTRALINE HCL 50 MG TABS: 50 | 30 days supply | Qty: 30 | Fill #0

## 2019-08-22 MED FILL — CYCLOBENZAPRINE 5 MG TABLET: 5 | 15 days supply | Qty: 30 | Fill #0

## 2019-08-22 MED FILL — GABAPENTIN 300 MG CAPSULE: 300 | 30 days supply | Qty: 60 | Fill #0

## 2019-08-22 MED FILL — HYDROCHLOROTHIAZIDE 12.5 MG: 12.5 | 30 days supply | Qty: 30 | Fill #0

## 2019-08-22 MED FILL — ?AMLODIPINE BESYLATE 10 MG: 10 | 30 days supply | Qty: 30 | Fill #0

## 2019-08-22 MED FILL — POTASSIUM CHLORIDE ER 10 ME: 10 | 30 days supply | Qty: 30 | Fill #0

## 2019-08-22 NOTE — Progress Notes (Signed)
Patient ID: [name] [name], female   DOB: 1965/10/03, 54 y.o.   MRN: [id number] Virtual Visit via Telephone Note  I connected with Thomasene Ripple on 08/22/19 at  3:10 PM EST by telephone and verified that I am speaking with the correct person using two identifiers.   I discussed the limitations, risks, security and privacy concerns of performing an evaluation and management service by telephone and the availability of in person appointments. I also discussed with the patient that there may be a patient responsible charge related to this service. The patient expressed understanding and agreed to proceed.  PATIENT visit by telephone virtually in the context of Covid-19 pandemic. Patient location:  home My Location:  Remote/home office Persons on the call:  Me and the patient    History of Present Illness:  Seen at ED 1 month ago.  CT face/head done due to concern for celluliits.  CBC and BMP unremarkable.  Prescribed augmentin for possible dental infection.    Patient having frequent HA and needs to see eye doctor for new glasses.  She admits she has been off all of her medications.  She is also having flare ups of back pain, knee pain and general muscle aches.  She has not drank alcohol or used marijuana in about 6 months.    She needs RF currently on all her meds.      Observations/Objective:  NAD A&Ox3  Assessment and Plan: 1. Neuropathy - diclofenac Sodium (VOLTAREN) 1 % GEL; Apply 2 g topically 4 (four) times daily.  Dispense: 100 g; Refill: 2 - gabapentin (NEURONTIN) 300 MG capsule; Take 2 capsules (600 mg total) by mouth at bedtime.  Dispense: 90 capsule; Refill: 3  2. Essential hypertension Not on meds.  Resume meds - hydrochlorothiazide (HYDRODIURIL) 12.5 MG tablet; Take 1 tablet (12.5 mg total) by mouth daily.  Dispense: 90 tablet; Refill: 3 - potassium chloride (KLOR-CON) 10 MEQ tablet; Take 1 tablet (10 mEq total) by mouth daily.  Dispense: 30 tablet; Refill: 2 -  amLODipine (NORVASC) 10 MG tablet; Take 1 tablet (10 mg total) by mouth daily.  Dispense: 90 tablet; Refill: 3  3. Muscle spasm - cyclobenzaprine (FLEXERIL) 5 MG tablet; 1-2 at bedtime prn  Dispense: 30 tablet; Refill: 1  4. Alcohol-induced insomnia (HCC)sober X 6 months!  Congratulated!! - hydrOXYzine (ATARAX/VISTARIL) 25 MG tablet; Take 1 tablet (25 mg total) by mouth at bedtime as needed (sleep).  Dispense: 30 tablet; Refill: 3  5. Hypokalemia - potassium chloride (KLOR-CON) 10 MEQ tablet; Take 1 tablet (10 mEq total) by mouth daily.  Dispense: 30 tablet; Refill: 2  6. Vision changes  - Ambulatory referral to Ophthalmology  7. Tooth infection - Ambulatory referral to Dentistry - penicillin v potassium (VEETID) 250 MG tablet; Take 1 tablet (250 mg total) by mouth 4 (four) times daily.  Dispense: 28 tablet; Refill: 0  8. Heel pain, chronic, right Known heel spur-needs referral - Ambulatory referral to Podiatry  9. Chronic midline low back pain without sciatica - gabapentin (NEURONTIN) 300 MG capsule; Take 2 capsules (600 mg total) by mouth at bedtime.  Dispense: 90 capsule; Refill: 3 - ibuprofen (ADVIL) 800 MG tablet; Take 1 tablet (800 mg total) by mouth every 8 (eight) hours as needed for up to 30 doses for headache.  Dispense: 30 tablet; Refill: 0   Follow Up Instructions: See PCP in 3-4 weeks   I discussed the assessment and treatment plan with the patient. The patient was provided an opportunity  to ask questions and all were answered. The patient agreed with the plan and demonstrated an understanding of the instructions.   The patient was advised to call back or seek an in-person evaluation if the symptoms worsen or if the condition fails to improve as anticipated.  I provided 14 minutes of non-face-to-face time during this encounter.   Freeman Caldron, PA-C

## 2019-08-22 NOTE — Progress Notes (Signed)
Patient has been called and DOB has been verified. Patient has been screened and transferred to PCP to start phone visit.  Pain in knees and hips. Pain in back of leg up to butt. Antibiotics for mouth swelling. Having headaches.

## 2019-08-26 ENCOUNTER — Ambulatory Visit: Payer: Medicaid Other | Admitting: Internal Medicine

## 2019-09-25 ENCOUNTER — Ambulatory Visit: Payer: Self-pay | Attending: Internal Medicine | Admitting: Physician Assistant

## 2019-09-25 ENCOUNTER — Other Ambulatory Visit: Payer: Self-pay

## 2019-09-25 ENCOUNTER — Encounter: Payer: Self-pay | Admitting: Neurology

## 2019-09-25 VITALS — BP 152/87 | HR 93 | Temp 98.0°F | Resp 16 | Ht 65.0 in | Wt 185.0 lb

## 2019-09-25 DIAGNOSIS — I10 Essential (primary) hypertension: Secondary | ICD-10-CM | POA: Insufficient documentation

## 2019-09-25 DIAGNOSIS — F319 Bipolar disorder, unspecified: Secondary | ICD-10-CM | POA: Insufficient documentation

## 2019-09-25 DIAGNOSIS — K219 Gastro-esophageal reflux disease without esophagitis: Secondary | ICD-10-CM | POA: Insufficient documentation

## 2019-09-25 DIAGNOSIS — M79605 Pain in left leg: Secondary | ICD-10-CM | POA: Insufficient documentation

## 2019-09-25 DIAGNOSIS — Z888 Allergy status to other drugs, medicaments and biological substances status: Secondary | ICD-10-CM | POA: Insufficient documentation

## 2019-09-25 DIAGNOSIS — G8929 Other chronic pain: Secondary | ICD-10-CM | POA: Insufficient documentation

## 2019-09-25 DIAGNOSIS — R059 Cough, unspecified: Secondary | ICD-10-CM

## 2019-09-25 DIAGNOSIS — Z79899 Other long term (current) drug therapy: Secondary | ICD-10-CM | POA: Insufficient documentation

## 2019-09-25 DIAGNOSIS — R05 Cough: Secondary | ICD-10-CM | POA: Insufficient documentation

## 2019-09-25 DIAGNOSIS — J45909 Unspecified asthma, uncomplicated: Secondary | ICD-10-CM | POA: Insufficient documentation

## 2019-09-25 DIAGNOSIS — Z8711 Personal history of peptic ulcer disease: Secondary | ICD-10-CM | POA: Insufficient documentation

## 2019-09-25 DIAGNOSIS — M79604 Pain in right leg: Secondary | ICD-10-CM | POA: Insufficient documentation

## 2019-09-25 DIAGNOSIS — M545 Low back pain, unspecified: Secondary | ICD-10-CM

## 2019-09-25 DIAGNOSIS — E785 Hyperlipidemia, unspecified: Secondary | ICD-10-CM | POA: Insufficient documentation

## 2019-09-25 DIAGNOSIS — G629 Polyneuropathy, unspecified: Secondary | ICD-10-CM | POA: Insufficient documentation

## 2019-09-25 DIAGNOSIS — R519 Headache, unspecified: Secondary | ICD-10-CM | POA: Insufficient documentation

## 2019-09-25 MED ORDER — DICLOFENAC SODIUM 3 % EX GEL: 1.0000 | Freq: Two times a day (BID) | CUTANEOUS | 3 refills | Status: DC

## 2019-09-25 MED ORDER — GABAPENTIN 300 MG PO CAPS: 900.0000 mg | ORAL_CAPSULE | Freq: Every day | ORAL | 3 refills | Status: DC

## 2019-09-25 MED ORDER — BENZONATATE 100 MG PO CAPS: 200.0000 mg | ORAL_CAPSULE | Freq: Three times a day (TID) | ORAL | 0 refills | Status: DC | PRN

## 2019-09-25 MED FILL — GABAPENTIN 300 MG CAPSULE: 300 | 30 days supply | Qty: 90 | Fill #0

## 2019-09-25 MED FILL — BENZONATATE 100 MG CAPS: 100 | 10 days supply | Qty: 60 | Fill #0

## 2019-09-25 NOTE — Progress Notes (Signed)
[name] [name], is a 54 y.o. female  SWF:[id number]  UKG:[id number]  DOB - 1965/11/29  Subjective:  Chief Complaint and HPI: [name] [name] is a 54 y.o. female here today with several issues.  C/o daily HA that occur up to 3 times daily for the last 4-6 months.  Tried Tylenol, Ibuprofen, and goodies powders with little relief.  No N/V.  No vision changes at time of HA.  No aura.  HA is in the temporal region.  Drank alcohol on her bday-otherwise had not had alcohol in about 6 months.  Rarely smokes marijuana now.  No other substances.    Also c/o B leg pain daily. This has been ongoing.  On gabapentin and 1% voltaren gel without relief.  Denies swelling.  No SOB/CP.    Also c/o intermittent coughing spells with temperature changes.  No f/c.  Cough is non-productive.    ROS:   Constitutional:  No f/c, No night sweats, No unexplained weight loss. EENT:  No vision changes, No blurry vision, No hearing changes. No mouth, throat, or ear problems.  Respiratory: No cough, No SOB Cardiac: No CP, no palpitations GI:  No abd pain, No N/V/D. GU: No Urinary s/sx Musculoskeletal: see above Neuro: + headache, no dizziness, no motor weakness.  Skin: No rash Endocrine:  No polydipsia. No polyuria.  Psych: Denies SI/HI  No problems updated.  ALLERGIES: Allergies  Allergen Reactions  . Diphenhydramine Hcl Itching  . Metoprolol Palpitations    PAST MEDICAL HISTORY: Past Medical History:  Diagnosis Date  . Asthma   . Bipolar 1 disorder (HCC)   . Chronic lumbar pain   . DDD (degenerative disc disease)   . Depression   . GERD (gastroesophageal reflux disease)   . History of gastric ulcer   . Hyperlipidemia   . Hypertension   . Migraine   . Osteoarthritis   . Right ACL tear   . Right knee meniscal tear   . Sciatica   . Wears glasses     MEDICATIONS AT HOME: Prior to Admission medications   Medication Sig Start Date End Date Taking? Authorizing Provider  acetaminophen  (TYLENOL) 500 MG tablet Take 1 tablet (500 mg total) by mouth every 6 (six) hours as needed for up to 30 doses. 06/27/19  Yes Curatolo, Adam, DO  acetaminophen-codeine (TYLENOL #3) 300-30 MG tablet Take 1 tablet by mouth every 4 (four) hours as needed for moderate pain. 11/28/18  Yes Georgian Co M, PA-C  amLODipine (NORVASC) 10 MG tablet Take 1 tablet (10 mg total) by mouth daily. 08/22/19  Yes Georgian Co M, PA-C  Ascorbic Acid (VITAMIN C PO) Take 1 tablet by mouth daily.   Yes [provider]  cetirizine (ZYRTEC) 10 MG tablet Take 1 tablet (10 mg total) by mouth daily. 01/17/19  Yes , Marylene Land M, PA-C  Cyanocobalamin (VITAMIN B-12 PO) Take 1 tablet by mouth daily.   Yes [provider]  cyclobenzaprine (FLEXERIL) 5 MG tablet 1-2 at bedtime prn 08/22/19  Yes ,  M, PA-C  fluticasone (FLONASE) 50 MCG/ACT nasal spray Place 1 spray into both nostrils daily. 08/22/19  Yes Anders Simmonds, PA-C  gabapentin (NEURONTIN) 300 MG capsule Take 3 capsules (900 mg total) by mouth at bedtime. 09/25/19  Yes Georgian Co M, PA-C  hydrochlorothiazide (HYDRODIURIL) 12.5 MG tablet Take 1 tablet (12.5 mg total) by mouth daily. 08/22/19  Yes Anders Simmonds, PA-C  hydrOXYzine (ATARAX/VISTARIL) 25 MG tablet Take 1 tablet (25 mg total) by mouth  at bedtime as needed (sleep). 08/22/19  Yes Argentina Donovan, PA-C  Multiple Vitamin (MULTIVITAMIN) tablet Take 1 tablet by mouth daily.   Yes [provider]  Omega-3 Fatty Acids (OMEGA-3 FISH OIL PO) Take 1 tablet by mouth daily.   Yes [provider]  omeprazole (PRILOSEC) 20 MG capsule Take 1 capsule (20 mg total) by mouth daily. 01/17/19  Yes Freeman Caldron M, PA-C  potassium chloride (KLOR-CON) 10 MEQ tablet Take 1 tablet (10 mEq total) by mouth daily. 08/22/19  Yes Freeman Caldron M, PA-C  sertraline (ZOLOFT) 50 MG tablet Take 1 tablet (50 mg total) by mouth daily. 08/22/19  Yes Argentina Donovan, PA-C  triamcinolone  cream (KENALOG) 0.1 % Apply 1 application topically 2 (two) times daily. 05/24/19  Yes Petrucelli, Samantha R, PA-C  benzonatate (TESSALON) 100 MG capsule Take 2 capsules (200 mg total) by mouth 3 (three) times daily as needed for cough. 09/25/19   Argentina Donovan, PA-C  Diclofenac Sodium 3 % GEL Apply 1 each topically 2 (two) times daily. 09/25/19   Argentina Donovan, PA-C  ibuprofen (ADVIL) 800 MG tablet Take 1 tablet (800 mg total) by mouth every 8 (eight) hours as needed for up to 30 doses for headache. 08/22/19   Argentina Donovan, PA-C     Objective:  EXAM:   Vitals:   09/25/19 1116  BP: (!) 152/87  Pulse: 93  Resp: 16  Temp: 98 F (36.7 C)  SpO2: 97%  Weight: 185 lb (83.9 kg)  Height: 5\' 5"  (1.651 m)    General appearance : A&OX3. NAD. Non-toxic-appearing HEENT: Atraumatic and Normocephalic.  PERRLA. EOM intact.  Chest/Lungs:  Breathing-non-labored, Good air entry bilaterally, breath sounds normal without rales, rhonchi, or wheezing  CVS: S1 S2 regular, no murmurs, gallops, rubs  Extremities: Bilateral Lower Ext shows no edema, both legs are warm to touch with = pulse throughout.  No swelling of either calf.  No erythema.  Neg Homan's B.  No visible abnormality of either leg Neurology:  CN II-XII grossly intact, Non focal.   Psych:  TP linear. J/I WNL. Normal speech. Appropriate eye contact and affect.  Skin:  No Rash  Data Review Lab Results  Component Value Date   HGBA1C 5.8 (H) 01/04/2019   HGBA1C 5.7 (H) 11/23/2017   HGBA1C 5.4 07/31/2016     Assessment & Plan   1. Neuropathy/leg pain Will increase dose gabapentin - gabapentin (NEURONTIN) 300 MG capsule; Take 3 capsules (900 mg total) by mouth at bedtime.  Dispense: 90 capsule; Refill: 3  2. Chronic midline low back pain without sciatica - gabapentin (NEURONTIN) 300 MG capsule; Take 3 capsules (900 mg total) by mouth at bedtime.  Dispense: 90 capsule; Refill: 3  3. Nonintractable headache, unspecified  chronicity pattern, unspecified headache type Can continue OTC.  Discussed rebound HA, HA care, HA journal, etc.  No red flags today - Ambulatory referral to Neurology  4. Cough - benzonatate (TESSALON) 100 MG capsule; Take 2 capsules (200 mg total) by mouth 3 (three) times daily as needed for cough.  Dispense: 60 capsule; Refill: 0   Patient have been counseled extensively about nutrition and exercise  Return in about 3 months (around 12/23/2019) for with PCP for chronic condityions.  The patient was given clear instructions to go to ER or return to medical center if symptoms don't improve, worsen or new problems develop. The patient verbalized understanding. The patient was told to call to get lab results if they  haven't heard anything in the next week.     Georgian Co, PA-C [facility] [place], [place] [phone]   09/25/2019, 11:41 AMPatient ID: [name] [name], female   DOB: 12-31-1965, 54 y.o.   MRN: [id number]

## 2019-09-25 NOTE — Progress Notes (Signed)
Pt c/o BL leg pain radiating down the ankle  Pt states getting off of Zoloft because it makes her feel funny. Cannot describe/

## 2019-10-08 ENCOUNTER — Ambulatory Visit: Payer: No Typology Code available for payment source | Admitting: Sports Medicine

## 2019-10-08 ENCOUNTER — Encounter: Payer: Self-pay | Admitting: Sports Medicine

## 2019-10-08 ENCOUNTER — Other Ambulatory Visit: Payer: Self-pay

## 2019-10-08 ENCOUNTER — Ambulatory Visit: Payer: No Typology Code available for payment source

## 2019-10-08 DIAGNOSIS — M79672 Pain in left foot: Secondary | ICD-10-CM

## 2019-10-08 DIAGNOSIS — M79671 Pain in right foot: Secondary | ICD-10-CM

## 2019-10-08 DIAGNOSIS — M7731 Calcaneal spur, right foot: Secondary | ICD-10-CM

## 2019-10-08 DIAGNOSIS — M722 Plantar fascial fibromatosis: Secondary | ICD-10-CM

## 2019-10-08 DIAGNOSIS — M7661 Achilles tendinitis, right leg: Secondary | ICD-10-CM

## 2019-10-08 MED ORDER — PREDNISONE 10 MG (21) PO TBPK: ORAL_TABLET | ORAL | 0 refills | Status: DC

## 2019-10-08 NOTE — Progress Notes (Signed)
Subjective: [name] [name] is a 54 y.o. female patient presents to office with complaint of moderate heel pain on the left and right. 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 Amalia Hailey and was recommended to have surgery again on the right patient is status post previous Achilles surgery on right and admits pain at the scar at the back of the heel as well as pain to the bottoms of both heels pain is worse when getting out of bed in the morning pain constantly 7 out of 10 right is worse than left reports that she has tried gabapentin and muscle relaxants without complete improvement and wants to discuss about further treatment options.  Review of Systems  All other systems reviewed and are negative.   Patient Active Problem List   Diagnosis Date Noted  . Prediabetes 01/04/2019  . Obesity (BMI 30-39.9) 01/04/2019  . Muscle spasm 09/28/2018  . Ganglion cyst 09/28/2018  . Carpal tunnel syndrome of right wrist 09/28/2018  . Chronic midline low back pain without sciatica 11/23/2017  . Essential hypertension 11/23/2017  . Chronic pain of right knee 11/23/2017  . 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 Marijuana abuse 11/23/2017  . Tobacco dependence 11/23/2017  . Alcohol abuse 07/30/2016  . S/P ACL reconstruction 01/31/2014  . DEPRESSION 11/17/2008  . ALLERGIC RHINITIS 11/17/2008  . GERD 11/17/2008    Patient reports a 49-month history of being sober  Current Outpatient Medications on File Prior to Visit  Medication Sig Dispense Refill  . acetaminophen (TYLENOL) 500 MG tablet Take 1 tablet (500 mg total) by mouth every 6 (six) hours as needed for up to 30 doses. 30 tablet 0  . acetaminophen-codeine (TYLENOL #3) 300-30 MG tablet Take 1 tablet by mouth every 4 (four) hours as needed for moderate pain. 30 tablet 0  . amLODipine (NORVASC) 10 MG tablet Take 1 tablet (10 mg total) by mouth daily. 90 tablet 3  . Ascorbic Acid (VITAMIN C PO) Take 1  tablet by mouth daily.    . benzonatate (TESSALON) 100 MG capsule Take 2 capsules (200 mg total) by mouth 3 (three) times daily as needed for cough. 60 capsule 0  . cetirizine (ZYRTEC) 10 MG tablet Take 1 tablet (10 mg total) by mouth daily. 30 tablet 11  . Cyanocobalamin (VITAMIN B-12 PO) Take 1 tablet by mouth daily.    . cyclobenzaprine (FLEXERIL) 5 MG tablet 1-2 at bedtime prn 30 tablet 1  . Diclofenac Sodium 3 % GEL Apply 1 each topically 2 (two) times daily. 100 g 3  . fluticasone (FLONASE) 50 MCG/ACT nasal spray Place 1 spray into both nostrils daily. 16 g 0  . gabapentin (NEURONTIN) 300 MG capsule Take 3 capsules (900 mg total) by mouth at bedtime. 90 capsule 3  . hydrochlorothiazide (HYDRODIURIL) 12.5 MG tablet Take 1 tablet (12.5 mg total) by mouth daily. 90 tablet 3  . hydrOXYzine (ATARAX/VISTARIL) 25 MG tablet Take 1 tablet (25 mg total) by mouth at bedtime as needed (sleep). 30 tablet 3  . ibuprofen (ADVIL) 800 MG tablet Take 1 tablet (800 mg total) by mouth every 8 (eight) hours as needed for up to 30 doses for headache. 30 tablet 0  . Multiple Vitamin (MULTIVITAMIN) tablet Take 1 tablet by mouth daily.    . Omega-3 Fatty Acids (OMEGA-3 FISH OIL PO) Take 1 tablet by mouth daily.    Marland Kitchen omeprazole (PRILOSEC) 20 MG capsule Take 1 capsule (20 mg total) by  mouth daily. 30 capsule 3  . potassium chloride (KLOR-CON) 10 MEQ tablet Take 1 tablet (10 mEq total) by mouth daily. 30 tablet 2  . sertraline (ZOLOFT) 50 MG tablet Take 1 tablet (50 mg total) by mouth daily. 30 tablet 1  . triamcinolone cream (KENALOG) 0.1 % Apply 1 application topically 2 (two) times daily. 30 g 0   No current facility-administered medications on file prior to visit.    Allergies  Allergen Reactions  . Diphenhydramine Hcl Itching  . Metoprolol Palpitations    Objective: Physical Exam General: The patient is alert and oriented x3 in no acute distress.  Dermatology: Skin is warm, dry and supple bilateral  lower extremities. Nails 1-10 are normal. There is no erythema, edema, no eccymosis, no open lesions present. Integument is otherwise unremarkable.  Old scar to right posterior heel.  Vascular: Dorsalis Pedis pulse and Posterior Tibial pulse are 2/4 bilateral. Capillary fill time is immediate to all digits.  Neurological: Grossly intact to light touch with an achilles reflex of +2/5 and a  negative Tinel's sign bilateral.  Musculoskeletal: Tenderness to palpation at the medial calcaneal tubercale and through the insertion of the plantar fascia on the left and right foot. No pain with compression of calcaneus bilateral however there is pain at scar on the posterior heel on the right. No pain with tuning fork to calcaneus bilateral. No pain with calf compression bilateral. There is decreased Ankle joint range of motion bilateral. All other joints range of motion within normal limits bilateral. Strength 5/5 in all groups bilateral.   Gait: Unassisted, Antalgic avoid weight on heels  Xray, Right foot:  Normal osseous mineralization. Joint spaces preserved except at ankle where there is signs of arthritis and evidence of osteophytes of the posterior heel from previous surgery. No fracture/dislocation/boney destruction. Calcaneal spur present with mild thickening of plantar fascia.  Heel implant intact at calcaneus.  No other soft tissue abnormalities or radiopaque foreign bodies.   Assessment and Plan: Problem List Items Addressed This Visit    None    Visit Diagnoses    Heel spur, right    -  Primary   Relevant Orders   DG Foot Complete Right (Completed)   Plantar fasciitis       Achilles tendinitis, right leg       Bilateral foot pain          -Complete examination performed.  -Xrays reviewed -Discussed with patient in detail the condition of plantar fasciitis and residual tendinitis pain at scar at right heel from previous surgery, how this occurs and general treatment options. Explained  both conservative and surgical treatments.  -Patient declined steroid injection at this time -Prescribed prednisone Dosepak for patient to take as instructed -Dispensed heel lifts -Recommend gentle stretching and icing as instructed -Advised good supportive shoes however patient has pain to the back of the heel and right now can only wear sandals. -Advised patient depending on how she is doing at next visit we can further talk about more advanced treatment options -Patient to return to office in 4 weeks for follow up or sooner if problems or questions arise.  Asencion Islam, DPM

## 2019-10-29 ENCOUNTER — Encounter: Payer: Self-pay | Admitting: Sports Medicine

## 2019-10-29 ENCOUNTER — Other Ambulatory Visit: Payer: Self-pay

## 2019-10-29 ENCOUNTER — Ambulatory Visit: Payer: No Typology Code available for payment source | Admitting: Sports Medicine

## 2019-10-29 VITALS — Temp 97.8°F

## 2019-10-29 DIAGNOSIS — M7661 Achilles tendinitis, right leg: Secondary | ICD-10-CM

## 2019-10-29 DIAGNOSIS — M79672 Pain in left foot: Secondary | ICD-10-CM

## 2019-10-29 DIAGNOSIS — L905 Scar conditions and fibrosis of skin: Secondary | ICD-10-CM

## 2019-10-29 DIAGNOSIS — M79671 Pain in right foot: Secondary | ICD-10-CM

## 2019-10-29 DIAGNOSIS — S86011S Strain of right Achilles tendon, sequela: Secondary | ICD-10-CM

## 2019-10-29 DIAGNOSIS — M722 Plantar fascial fibromatosis: Secondary | ICD-10-CM

## 2019-10-29 DIAGNOSIS — R52 Pain, unspecified: Secondary | ICD-10-CM

## 2019-10-29 MED ORDER — HYDROCODONE-ACETAMINOPHEN 5-325 MG PO TABS: 1.0000 | ORAL_TABLET | Freq: Four times a day (QID) | ORAL | 0 refills | Status: AC | PRN

## 2019-10-29 NOTE — Progress Notes (Signed)
++ Subjective: [name] [name] is a 54 y.o. female patient returns to office with complaint of moderate heel pain on the right greater than the left.  Patient reports that the prednisone helped a little bit but the pain today is 8 out of 10 especially on the back of the right heel nothing can touch the area still pretty painful at all area of scar.  Patient reports that the pain is so bad that sometimes she feels like she needs something for the pain and has discussed it with her PCP who only offered tramadol which does not work and reports that she cannot give her any other medication however patient expresses that she has been clean and has not had a history of any relapse of problems with substance abuse and reports that the pain is so bad that it dries her in agony and has not been able to wear a shoe due to the pain at the back of her heel for the last 4 years.  Patient denies any new trauma or injury.  No other pedal complaints noted.   Patient Active Problem List   Diagnosis Date Noted  . Prediabetes 01/04/2019  . Obesity (BMI 30-39.9) 01/04/2019  . Muscle spasm 09/28/2018  . Ganglion cyst 09/28/2018  . Carpal tunnel syndrome of right wrist 09/28/2018  . Chronic midline low back pain without sciatica 11/23/2017  . Essential hypertension 11/23/2017  . Chronic pain of right knee 11/23/2017  . 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 Marijuana abuse 11/23/2017  . Tobacco dependence 11/23/2017  . Alcohol abuse 07/30/2016  . S/P ACL reconstruction 01/31/2014  . DEPRESSION 11/17/2008  . ALLERGIC RHINITIS 11/17/2008  . GERD 11/17/2008    Patient reports a 94-month history of being sober  Current Outpatient Medications on File Prior to Visit  Medication Sig Dispense Refill  . acetaminophen (TYLENOL) 500 MG tablet Take 1 tablet (500 mg total) by mouth every 6 (six) hours as needed for up to 30 doses. 30 tablet 0  . acetaminophen-codeine (TYLENOL #3) 300-30 MG tablet Take 1 tablet by  mouth every 4 (four) hours as needed for moderate pain. 30 tablet 0  . amLODipine (NORVASC) 10 MG tablet Take 1 tablet (10 mg total) by mouth daily. 90 tablet 3  . Ascorbic Acid (VITAMIN C PO) Take 1 tablet by mouth daily.    . benzonatate (TESSALON) 100 MG capsule Take 2 capsules (200 mg total) by mouth 3 (three) times daily as needed for cough. 60 capsule 0  . cetirizine (ZYRTEC) 10 MG tablet Take 1 tablet (10 mg total) by mouth daily. 30 tablet 11  . Cyanocobalamin (VITAMIN B-12 PO) Take 1 tablet by mouth daily.    . cyclobenzaprine (FLEXERIL) 5 MG tablet 1-2 at bedtime prn 30 tablet 1  . Diclofenac Sodium 3 % GEL Apply 1 each topically 2 (two) times daily. 100 g 3  . fluticasone (FLONASE) 50 MCG/ACT nasal spray Place 1 spray into both nostrils daily. 16 g 0  . gabapentin (NEURONTIN) 300 MG capsule Take 3 capsules (900 mg total) by mouth at bedtime. 90 capsule 3  . hydrochlorothiazide (HYDRODIURIL) 12.5 MG tablet Take 1 tablet (12.5 mg total) by mouth daily. 90 tablet 3  . hydrOXYzine (ATARAX/VISTARIL) 25 MG tablet Take 1 tablet (25 mg total) by mouth at bedtime as needed (sleep). 30 tablet 3  . ibuprofen (ADVIL) 800 MG tablet Take 1 tablet (800 mg total) by mouth every 8 (eight) hours as needed for up to 30 doses  for headache. 30 tablet 0  . Multiple Vitamin (MULTIVITAMIN) tablet Take 1 tablet by mouth daily.    . Omega-3 Fatty Acids (OMEGA-3 FISH OIL PO) Take 1 tablet by mouth daily.    Marland Kitchen omeprazole (PRILOSEC) 20 MG capsule Take 1 capsule (20 mg total) by mouth daily. 30 capsule 3  . potassium chloride (KLOR-CON) 10 MEQ tablet Take 1 tablet (10 mEq total) by mouth daily. 30 tablet 2  . predniSONE (STERAPRED UNI-PAK 21 TAB) 10 MG (21) TBPK tablet Take as directed 21 tablet 0  . sertraline (ZOLOFT) 50 MG tablet Take 1 tablet (50 mg total) by mouth daily. 30 tablet 1  . triamcinolone cream (KENALOG) 0.1 % Apply 1 application topically 2 (two) times daily. 30 g 0   No current  facility-administered medications on file prior to visit.    Allergies  Allergen Reactions  . Diphenhydramine Hcl Itching  . Metoprolol Palpitations    Objective: Physical Exam General: The patient is alert and oriented x3 in no acute distress.  Dermatology: Skin is warm, dry and supple bilateral lower extremities. Nails 1-10 are normal. There is no erythema, edema, no eccymosis, no open lesions present. Integument is otherwise unremarkable.  Old scar to right posterior heel with central painful raised area no redness no warmth no active drainage no other acute signs of infection.  Vascular: Dorsalis Pedis pulse and Posterior Tibial pulse are 2/4 bilateral. Capillary fill time is immediate to all digits.  Neurological: Grossly intact to light touch with an achilles reflex of +2/5 and a  negative Tinel's sign bilateral.  Musculoskeletal: Tenderness to palpation at the medial calcaneal tubercale and through the insertion of the plantar fascia on the left and right foot. No pain with compression of calcaneus bilateral however there is pain at scar on the posterior heel on the right with most pain at the scar and at the Achilles insertion which is concerning for residual tear versus suture abscess at area of painful scar. No pain with tuning fork to calcaneus bilateral. No pain with calf compression bilateral. There is decreased Ankle joint range of motion bilateral. All other joints range of motion within normal limits bilateral. Strength 5/5 in all groups bilateral.   Assessment and Plan: Problem List Items Addressed This Visit    None    Visit Diagnoses    Achilles tendinitis, right leg    -  Primary   Relevant Medications   HYDROcodone-acetaminophen (NORCO) 5-325 MG tablet   Scar painful       Relevant Medications   HYDROcodone-acetaminophen (NORCO) 5-325 MG tablet   Partial tear of Achilles tendon, right, sequela       Relevant Medications   HYDROcodone-acetaminophen (NORCO) 5-325  MG tablet   Plantar fasciitis       Bilateral foot pain          -Complete examination performed.  -Previous xrays reviewed -Re-Discussed with patient in detail the condition of plantar fasciitis and residual tendinitis pain at scar at right heel from previous surgery, how this occurs and general treatment options. Explained both conservative and surgical treatments.  -Patient declined steroid injection at this time again this visit so I did offer her for symptomatic relief a few pills of hydrocodone very low dose however I did tell patient this prescription will be only given to her this 1 time to help with her symptoms and that we have to be careful especially with patient's history of previous substance abuse -Ordered MRI to reevaluate Achilles tendon  area on right and at area of scar to rule out any residual tearing to the area -Continue with heel lifts bilateral -Recommend gentle stretching and icing as instructed -Advised good supportive that do not rub and aggravate heel especially on the right -Patient to return to office after MRI or sooner if problems or questions arise.  Asencion Islam, DPM

## 2019-10-30 ENCOUNTER — Ambulatory Visit: Payer: Medicaid Other | Attending: Family Medicine | Admitting: Physician Assistant

## 2019-10-30 DIAGNOSIS — M545 Low back pain, unspecified: Secondary | ICD-10-CM

## 2019-10-30 DIAGNOSIS — G8929 Other chronic pain: Secondary | ICD-10-CM

## 2019-10-30 DIAGNOSIS — M79605 Pain in left leg: Secondary | ICD-10-CM

## 2019-10-30 DIAGNOSIS — M79604 Pain in right leg: Secondary | ICD-10-CM

## 2019-10-30 DIAGNOSIS — I10 Essential (primary) hypertension: Secondary | ICD-10-CM

## 2019-10-30 DIAGNOSIS — M62838 Other muscle spasm: Secondary | ICD-10-CM

## 2019-10-30 DIAGNOSIS — F10982 Alcohol use, unspecified with alcohol-induced sleep disorder: Secondary | ICD-10-CM

## 2019-10-30 MED ORDER — PREDNISONE 10 MG (21) PO TBPK: ORAL_TABLET | ORAL | 0 refills | Status: DC

## 2019-10-30 MED ORDER — AMLODIPINE BESYLATE 10 MG PO TABS: 10.0000 mg | ORAL_TABLET | Freq: Every day | ORAL | 3 refills | Status: DC

## 2019-10-30 MED ORDER — HYDROXYZINE HCL 25 MG PO TABS: 25.0000 mg | ORAL_TABLET | Freq: Every evening | ORAL | 3 refills | Status: DC | PRN

## 2019-10-30 MED ORDER — METHOCARBAMOL 500 MG PO TABS: 1000.0000 mg | ORAL_TABLET | Freq: Four times a day (QID) | ORAL | 0 refills | Status: DC

## 2019-10-30 MED FILL — predniSONE 10 MG TABS: 10 | 7 days supply | Qty: 21 | Fill #0

## 2019-10-30 MED FILL — AMLODIPINE BESYLATE 10 MG T: 10 | 30 days supply | Qty: 30 | Fill #1

## 2019-10-30 MED FILL — hydrOXYzine HCL 25 MG TABS: 25 | 30 days supply | Qty: 30 | Fill #0

## 2019-10-30 MED FILL — HYDROCHLOROTHIAZIDE 12.5 MG: 12.5 | 30 days supply | Qty: 30 | Fill #1

## 2019-10-30 MED FILL — METHOCARBAMOL 500 MG TABS: 500 | 11 days supply | Qty: 90 | Fill #0

## 2019-10-30 NOTE — Progress Notes (Signed)
Virtual Visit via Telephone Note  I connected with [name] [name] on 10/30/19 at 11:10 AM EDT by telephone and verified that I am speaking with the correct person using two identifiers.   I discussed the limitations, risks, security and privacy concerns of performing an evaluation and management service by telephone and the availability of in person appointments. I also discussed with the patient that there may be a patient responsible charge related to this service. The patient expressed understanding and agreed to proceed.  PATIENT visit by telephone virtually in the context of Covid-19 pandemic. Patient location:  home My Location:  [facility] office Persons on the call:  Me and the patient   History of Present Illness:  Patient continues to have B leg pain from her knees down to her feet.  Lower back xrays have shown Degenerative changes of the spine most marked at L5-S1(01/2019).  She saw podiatry yesterday and they gave her hydrocodone-she says even this doesn't help her pain.  She also says the gabapentin and diclofenac get doesn't help.  Flexeril doesn't help.  This has been an ongoing issue(see other notes) and she may need pain management referral.     Observations/Objective: NAD. A&Ox3.   Assessment and Plan: 1. Alcohol-induced insomnia (HCC) Says she is no longer drinking - hydrOXYzine (ATARAX/VISTARIL) 25 MG tablet; Take 1 tablet (25 mg total) by mouth at bedtime as needed (sleep).  Dispense: 30 tablet; Refill: 3  2. Essential hypertension stable - amLODipine (NORVASC) 10 MG tablet; Take 1 tablet (10 mg total) by mouth daily.  Dispense: 90 tablet; Refill: 3  3. Leg pain, bilateral Ongoing, chronic. Will switch muscle relaxer - Ambulatory referral to Orthopedic Surgery - methocarbamol (ROBAXIN) 500 MG tablet; Take 2 tablets (1,000 mg total) by mouth 4 (four) times daily.  Dispense: 90 tablet; Refill: 0 - predniSONE (STERAPRED UNI-PAK 21 TAB) 10 MG (21) TBPK tablet; Take as  directed  Dispense: 21 tablet; Refill: 0  4. Muscle spasm See #3 - Ambulatory referral to Orthopedic Surgery  5. Chronic midline low back pain without sciatica - Ambulatory referral to Orthopedic Surgery - methocarbamol (ROBAXIN) 500 MG tablet; Take 2 tablets (1,000 mg total) by mouth 4 (four) times daily.  Dispense: 90 tablet; Refill: 0 - predniSONE (STERAPRED UNI-PAK 21 TAB) 10 MG (21) TBPK tablet; Take as directed  Dispense: 21 tablet; Refill: 0   Follow Up Instructions: See PCP 2-3 months   I discussed the assessment and treatment plan with the patient. The patient was provided an opportunity to ask questions and all were answered. The patient agreed with the plan and demonstrated an understanding of the instructions.   The patient was advised to call back or seek an in-person evaluation if the symptoms worsen or if the condition fails to improve as anticipated.  I provided 11 minutes of non-face-to-face time during this encounter.   Georgian Co, PA-C  Patient ID: [name] [name], female   DOB: [birth year], 54 y.o.   MRN: [id number]

## 2019-10-30 NOTE — Progress Notes (Signed)
  Pt states the flexeril is not helping.   Pt is requesting something different for muscle spasms

## 2019-10-31 ENCOUNTER — Encounter: Payer: Self-pay | Admitting: General Surgery

## 2019-10-31 ENCOUNTER — Other Ambulatory Visit: Payer: Self-pay

## 2019-10-31 ENCOUNTER — Telehealth: Payer: Self-pay | Admitting: *Deleted

## 2019-10-31 ENCOUNTER — Emergency Department (HOSPITAL_COMMUNITY)
Admission: EM | Admit: 2019-10-31 | Discharge: 2019-10-31 | Disposition: A | Discharge status: Home / Self Care | Payer: Medicaid Other | Attending: Emergency Medicine | Admitting: Emergency Medicine

## 2019-10-31 ENCOUNTER — Ambulatory Visit: Payer: Self-pay | Attending: General Surgery | Admitting: General Surgery

## 2019-10-31 VITALS — BP 118/82 | HR 98 | Temp 98.0°F | Resp 16 | Ht 64.0 in | Wt 188.0 lb

## 2019-10-31 DIAGNOSIS — I10 Essential (primary) hypertension: Secondary | ICD-10-CM | POA: Insufficient documentation

## 2019-10-31 DIAGNOSIS — F17210 Nicotine dependence, cigarettes, uncomplicated: Secondary | ICD-10-CM | POA: Insufficient documentation

## 2019-10-31 DIAGNOSIS — M7661 Achilles tendinitis, right leg: Secondary | ICD-10-CM

## 2019-10-31 DIAGNOSIS — G8918 Other acute postprocedural pain: Secondary | ICD-10-CM

## 2019-10-31 DIAGNOSIS — J45909 Unspecified asthma, uncomplicated: Secondary | ICD-10-CM | POA: Insufficient documentation

## 2019-10-31 DIAGNOSIS — L905 Scar conditions and fibrosis of skin: Secondary | ICD-10-CM

## 2019-10-31 DIAGNOSIS — L918 Other hypertrophic disorders of the skin: Secondary | ICD-10-CM

## 2019-10-31 DIAGNOSIS — Z79899 Other long term (current) drug therapy: Secondary | ICD-10-CM | POA: Insufficient documentation

## 2019-10-31 DIAGNOSIS — L7622 Postprocedural hemorrhage and hematoma of skin and subcutaneous tissue following other procedure: Secondary | ICD-10-CM | POA: Insufficient documentation

## 2019-10-31 DIAGNOSIS — S86011S Strain of right Achilles tendon, sequela: Secondary | ICD-10-CM

## 2019-10-31 DIAGNOSIS — Z5189 Encounter for other specified aftercare: Secondary | ICD-10-CM

## 2019-10-31 MED ORDER — SILVER NITRATE-POT NITRATE 75-25 % EX MISC
CUTANEOUS | Status: AC
  Filled 2019-10-31: qty 10

## 2019-10-31 MED ORDER — ONDANSETRON 4 MG PO TBDP: 4.0000 mg | ORAL_TABLET | Freq: Three times a day (TID) | ORAL | 0 refills | Status: DC | PRN

## 2019-10-31 MED ORDER — ONDANSETRON 4 MG PO TBDP
4.0000 mg | ORAL_TABLET | Freq: Once | ORAL | Status: AC
  Administered 2019-10-31: 4 mg via ORAL
  Filled 2019-10-31: qty 1

## 2019-10-31 NOTE — ED Provider Notes (Addendum)
[name]-EMERGENCY DEPT Provider Note   CSN: [id number] Arrival date & time: 10/31/19  1824     History Chief Complaint  Patient presents with  . Post-op Problem    [name] [name] is a 54 y.o. female past medical history significant for bipolar, DDD, GERD, hypertension, hyperlipidemia who presents for evaluation of postop bleeding.  Apparently patient was seen by her PCP today and had a mole/skin tag removed.  Patient noticed it is continue to ooze throughout the day.  Has changed her bandages 3 times.  She denies any blood thinners.  No headache, lightheadedness, dizziness, chest pain, shortness of breath abdominal pain, diarrhea or dysuria.  Patient states she did feel nauseous when she saw the blood.  No syncopal episodes.  Has never had any issues with bleeding previously.  She has tried to apply pressure without resolved.  Denies additional aggravating or alleviating factors.  History obtained from patient and past medical records.  No interpreter is used.  Per PCP note skin tag removed today.  HPI     Past Medical History:  Diagnosis Date  . Asthma   . Bipolar 1 disorder (HCC)   . Chronic lumbar pain   . DDD (degenerative disc disease)   . Depression   . GERD (gastroesophageal reflux disease)   . History of gastric ulcer   . Hyperlipidemia   . Hypertension   . Migraine   . Osteoarthritis   . Right ACL tear   . Right knee meniscal tear   . Sciatica   . Wears glasses     Patient Active Problem List   Diagnosis Date Noted  . Prediabetes 01/04/2019  . Obesity (BMI 30-39.9) 01/04/2019  . Muscle spasm 09/28/2018  . Ganglion cyst 09/28/2018  . Carpal tunnel syndrome of right wrist 09/28/2018  . Chronic midline low back pain without sciatica 11/23/2017  . Essential hypertension 11/23/2017  . Chronic pain of right knee 11/23/2017  . 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 Marijuana abuse 11/23/2017  . Tobacco dependence 11/23/2017  .  Alcohol abuse 07/30/2016  . S/P ACL reconstruction 01/31/2014  . DEPRESSION 11/17/2008  . ALLERGIC RHINITIS 11/17/2008  . GERD 11/17/2008    Past Surgical History:  Procedure Laterality Date  . ABDOMINAL HYSTERECTOMY    . BUNIONECTOMY Left 2011  . HEEL SPUR SURGERY Right 2012  . KNEE ARTHROSCOPY WITH ANTERIOR CRUCIATE LIGAMENT (ACL) REPAIR WITH HAMSTRING GRAFT Right 01/31/2014   Procedure: RIGHT KNEE ARTHROSCOPY WITH ALLOGRAFT (ACL) ANTERIOR CRUCIATE LIGAMENT RECONSTRUCTON PARTIAL MENISCECTOMY VERSES REPAIR ;  Surgeon: Eugenia Mcalpine, MD;  Location: [facility];  Service: Orthopedics;  Laterality: Right;  . TOTAL ABDOMINAL HYSTERECTOMY W/ BILATERAL SALPINGOOPHORECTOMY  04-20-2006     OB History   No obstetric history on file.     Family History  Problem Relation Age of Onset  . Hypertension Father     Social History   Tobacco Use  . Smoking status: Current Every Day Smoker    Packs/day: 0.25    Years: 32.00    Pack years: 8.00    Types: Cigarettes  . Smokeless tobacco: Never Used  Substance Use Topics  . Alcohol use: Not Currently    Alcohol/week: 14.0 standard drinks    Types: 14 Cans of beer per week    Comment: 4 40 ounce beers and liquor  . Drug use: Not Currently    Types: Marijuana    Comment: occasional  marijuana--  hx  "crack" use  last used  2004--  no treatment rehab (done on her own)    Home Medications Prior to Admission medications   Medication Sig Start Date End Date Taking? Authorizing Provider  acetaminophen (TYLENOL) 500 MG tablet Take 1 tablet (500 mg total) by mouth every 6 (six) hours as needed for up to 30 doses. 06/27/19   Curatolo, Adam, DO  acetaminophen-codeine (TYLENOL #3) 300-30 MG tablet Take 1 tablet by mouth every 4 (four) hours as needed for moderate pain. 11/28/18   Anders Simmonds, PA-C  amLODipine (NORVASC) 10 MG tablet Take 1 tablet (10 mg total) by mouth daily. 10/30/19   Anders Simmonds, PA-C  Ascorbic Acid  (VITAMIN C PO) Take 1 tablet by mouth daily.    [provider]  benzonatate (TESSALON) 100 MG capsule Take 2 capsules (200 mg total) by mouth 3 (three) times daily as needed for cough. 09/25/19   Anders Simmonds, PA-C  cetirizine (ZYRTEC) 10 MG tablet Take 1 tablet (10 mg total) by mouth daily. 01/17/19   Anders Simmonds, PA-C  Cyanocobalamin (VITAMIN B-12 PO) Take 1 tablet by mouth daily.    [provider]  Diclofenac Sodium 3 % GEL Apply 1 each topically 2 (two) times daily. 09/25/19   Anders Simmonds, PA-C  fluticasone (FLONASE) 50 MCG/ACT nasal spray Place 1 spray into both nostrils daily. 08/22/19   Anders Simmonds, PA-C  gabapentin (NEURONTIN) 300 MG capsule Take 3 capsules (900 mg total) by mouth at bedtime. 09/25/19   Anders Simmonds, PA-C  hydrochlorothiazide (HYDRODIURIL) 12.5 MG tablet Take 1 tablet (12.5 mg total) by mouth daily. 08/22/19   Anders Simmonds, PA-C  HYDROcodone-acetaminophen (NORCO) 5-325 MG tablet Take 1 tablet by mouth every 6 (six) hours as needed for up to 5 days for severe pain. 10/29/19 11/03/19  Asencion Islam, DPM  hydrOXYzine (ATARAX/VISTARIL) 25 MG tablet Take 1 tablet (25 mg total) by mouth at bedtime as needed (sleep). 10/30/19   Anders Simmonds, PA-C  ibuprofen (ADVIL) 800 MG tablet Take 1 tablet (800 mg total) by mouth every 8 (eight) hours as needed for up to 30 doses for headache. 08/22/19   Anders Simmonds, PA-C  methocarbamol (ROBAXIN) 500 MG tablet Take 2 tablets (1,000 mg total) by mouth 4 (four) times daily. 10/30/19   Anders Simmonds, PA-C  Multiple Vitamin (MULTIVITAMIN) tablet Take 1 tablet by mouth daily.    [provider]  Omega-3 Fatty Acids (OMEGA-3 FISH OIL PO) Take 1 tablet by mouth daily.    [provider]  omeprazole (PRILOSEC) 20 MG capsule Take 1 capsule (20 mg total) by mouth daily. 01/17/19   Anders Simmonds, PA-C  ondansetron (ZOFRAN ODT) 4 MG disintegrating tablet Take 1 tablet (4 mg total)  by mouth every 8 (eight) hours as needed for nausea or vomiting. 10/31/19   Cresencio Reesor A, PA-C  potassium chloride (KLOR-CON) 10 MEQ tablet Take 1 tablet (10 mEq total) by mouth daily. 08/22/19   Anders Simmonds, PA-C  predniSONE (STERAPRED UNI-PAK 21 TAB) 10 MG (21) TBPK tablet Take as directed 10/30/19   Anders Simmonds, PA-C  sertraline (ZOLOFT) 50 MG tablet Take 1 tablet (50 mg total) by mouth daily. 08/22/19   Anders Simmonds, PA-C  triamcinolone cream (KENALOG) 0.1 % Apply 1 application topically 2 (two) times daily. 05/24/19   Petrucelli, Samantha R, PA-C    Allergies    Diphenhydramine hcl and Metoprolol  Review of Systems   Review of Systems  Constitutional: Negative.   HENT: Negative.   Respiratory: Negative.   Cardiovascular: Negative.   Gastrointestinal: Negative.   Genitourinary: Negative.   Musculoskeletal: Negative.   Skin: Positive for wound.  Neurological: Negative.   All other systems reviewed and are negative.   Physical Exam Updated Vital Signs BP (!) 152/102   Pulse 83   Temp 98.2 F (36.8 C) (Oral)   Resp 19   SpO2 98%   Physical Exam Vitals and nursing note reviewed.  Constitutional:      General: She is not in acute distress.    Appearance: She is well-developed. She is not ill-appearing, toxic-appearing or diaphoretic.  HENT:     Head: Normocephalic and atraumatic.     Nose: Nose normal.     Mouth/Throat:     Mouth: Mucous membranes are moist.     Pharynx: Oropharynx is clear.  Eyes:     Pupils: Pupils are equal, round, and reactive to light.  Cardiovascular:     Rate and Rhythm: Normal rate.     Pulses: Normal pulses.     Heart sounds: Normal heart sounds.  Pulmonary:     Effort: Pulmonary effort is normal. No respiratory distress.     Breath sounds: Normal breath sounds.  Abdominal:     General: Bowel sounds are normal. There is no distension.  Musculoskeletal:        General: No swelling, tenderness, deformity or signs of  injury. Normal range of motion.     Cervical back: Normal range of motion.     Right lower leg: No edema.     Left lower leg: No edema.  Skin:    General: Skin is warm and dry.     Capillary Refill: Capillary refill takes less than 2 seconds.     Comments: 2-3 mm area of biopsy inferior to left breast in crease.  No active bleeding or drainage.  No surrounding edema, erythema or warmth.  Neurological:     General: No focal deficit present.     Mental Status: She is alert and oriented to person, place, and time.       ED Results / Procedures / Treatments   Labs (all labs ordered are listed, but only abnormal results are displayed) Labs Reviewed - No data to display  EKG None  Radiology No results found.  Procedures Wound closure utilizing adhes only  Date/Time: 10/31/2019 8:33 PM Performed by: Linwood Dibbles, PA-C Authorized by: Linwood Dibbles, PA-C  Consent: Verbal consent obtained. Written consent not obtained. Risks and benefits: risks, benefits and alternatives were discussed Consent given by: patient Patient understanding: patient states understanding of the procedure being performed Patient consent: the patient's understanding of the procedure matches consent given Procedure consent: procedure consent matches procedure scheduled Relevant documents: relevant documents present and verified Test results: test results available and properly labeled Site marked: the operative site was marked Imaging studies: imaging studies available Required items: required blood products, implants, devices, and special equipment available Patient identity confirmed: verbally with patient Time out: Immediately prior to procedure a "time out" was called to verify the correct patient, procedure, equipment, support staff and site/side marked as required. Preparation: Patient was prepped and draped in the usual sterile fashion. Local anesthesia used: no  Anesthesia: Local  anesthesia used: no  Sedation: Patient sedated: no  Patient tolerance: patient tolerated the procedure well with no immediate complications    (including critical care time)  Medications Ordered in ED Medications  ondansetron (ZOFRAN-ODT)  disintegrating tablet 4 mg (has no administration in time range)  silver nitrate applicators 68-11 % applicator (has no administration in time range)    ED Course  I have reviewed the triage vital signs and the nursing notes.  Pertinent labs & imaging results that were available during my care of the patient were reviewed by me and considered in my medical decision making (see chart for details).  54 year old female presents after skin tag removed earlier today at PCP office.  She is afebrile, nonseptic, non-ill-appearing.  Wound does not look infected.  No surrounding erythema, warmth, fluctuance or induration.  Approximately 2-3 mm area inferior to left breast inferior in crease.  No active bleeding or drainage.  Initially did have bleeding with triage nurse where she placed combat gauze and pressure dressing.  Patient concerned about bleeding returning this evening.  I have used a silver nitrate stick to cauterize this. Wound monitored without recurrent bleed. No dizziness, lightheadedness. Will have her follow with PCP tomorrow where she had her procedure performed.  The patient has been appropriately medically screened and/or stabilized in the ED. I have low suspicion for any other emergent medical condition which would require further screening, evaluation or treatment in the ED or require inpatient management.  Patient is hemodynamically stable and in no acute distress.  Patient able to ambulate in department prior to ED.  Evaluation does not show acute pathology that would require ongoing or additional emergent interventions while in the emergency department or further inpatient treatment.  I have discussed the diagnosis with the patient and  answered all questions.  Pain is been managed while in the emergency department and patient has no further complaints prior to discharge.  Patient is comfortable with plan discussed in room and is stable for discharge at this time.  I have discussed strict return precautions for returning to the emergency department.  Patient was encouraged to follow-up with PCP/specialist refer to at discharge.    MDM Rules/Calculators/A&P                      Final Clinical Impression(s) / ED Diagnoses Final diagnoses:  Visit for wound check  Post-operative pain    Rx / DC Orders ED Discharge Orders         Ordered    ondansetron (ZOFRAN ODT) 4 MG disintegrating tablet  Every 8 hours PRN     10/31/19 2026           Bexton Haak A, PA-C 10/31/19 2033    Aasir Daigler A, PA-C 10/31/19 2036    Margette Fast, MD 11/04/19 [phone]

## 2019-10-31 NOTE — Telephone Encounter (Signed)
Faxed orders to [facility] - Main Radiology Scheduling.

## 2019-10-31 NOTE — Addendum Note (Signed)
Addended by: Jimmye Norman on: 10/31/2019 04:10 PM   Modules accepted: Orders

## 2019-10-31 NOTE — Progress Notes (Signed)
Established Patient Office Visit  Subjective:  Patient ID: [name] [name], female    DOB: 1966-05-26  Age: 54 y.o. MRN: [id number]  CC: No chief complaint on file.   HPI TEYAH ROSSY presents for left inframammary fold skin tag  Past Medical History:  Diagnosis Date  . Asthma   . Bipolar 1 disorder (HCC)   . Chronic lumbar pain   . DDD (degenerative disc disease)   . Depression   . GERD (gastroesophageal reflux disease)   . History of gastric ulcer   . Hyperlipidemia   . Hypertension   . Migraine   . Osteoarthritis   . Right ACL tear   . Right knee meniscal tear   . Sciatica   . Wears glasses     Past Surgical History:  Procedure Laterality Date  . ABDOMINAL HYSTERECTOMY    . BUNIONECTOMY Left 2011  . HEEL SPUR SURGERY Right 2012  . KNEE ARTHROSCOPY WITH ANTERIOR CRUCIATE LIGAMENT (ACL) REPAIR WITH HAMSTRING GRAFT Right 01/31/2014   Procedure: RIGHT KNEE ARTHROSCOPY WITH ALLOGRAFT (ACL) ANTERIOR CRUCIATE LIGAMENT RECONSTRUCTON PARTIAL MENISCECTOMY VERSES REPAIR ;  Surgeon: Eugenia Mcalpine, MD;  Location: [facility];  Service: Orthopedics;  Laterality: Right;  . TOTAL ABDOMINAL HYSTERECTOMY W/ BILATERAL SALPINGOOPHORECTOMY  04-20-2006    Family History  Problem Relation Age of Onset  . Hypertension Father     Social History   Socioeconomic History  . Marital status: Legally Separated    Spouse name: Not on file  . Number of children: Not on file  . Years of education: Not on file  . Highest education level: Not on file  Occupational History  . Not on file  Tobacco Use  . Smoking status: Current Every Day Smoker    Packs/day: 0.25    Years: 32.00    Pack years: 8.00    Types: Cigarettes  . Smokeless tobacco: Never Used  Substance and Sexual Activity  . Alcohol use: Not Currently    Alcohol/week: 14.0 standard drinks    Types: 14 Cans of beer per week    Comment: 4 40 ounce beers and liquor  . Drug use: Not Currently    Types:  Marijuana    Comment: occasional  marijuana--  hx  "crack" use  last used 2004--  no treatment rehab (done on her own)  . Sexual activity: Not on file  Other Topics Concern  . Not on file  Social History Narrative  . Not on file   Social Determinants of Health   Financial Resource Strain:   . Difficulty of Paying Living Expenses:   Food Insecurity:   . Worried About Programme researcher, broadcasting/film/video in the Last Year:   . Barista in the Last Year:   Transportation Needs:   . Freight forwarder (Medical):   Marland Kitchen Lack of Transportation (Non-Medical):   Physical Activity:   . Days of Exercise per Week:   . Minutes of Exercise per Session:   Stress:   . Feeling of Stress :   Social Connections:   . Frequency of Communication with Friends and Family:   . Frequency of Social Gatherings with Friends and Family:   . Attends Religious Services:   . Active Member of Clubs or Organizations:   . Attends Banker Meetings:   Marland Kitchen Marital Status:   Intimate Partner Violence:   . Fear of Current or Ex-Partner:   . Emotionally Abused:   Marland Kitchen Physically Abused:   .  Sexually Abused:     Outpatient Medications Prior to Visit  Medication Sig Dispense Refill  . acetaminophen (TYLENOL) 500 MG tablet Take 1 tablet (500 mg total) by mouth every 6 (six) hours as needed for up to 30 doses. 30 tablet 0  . acetaminophen-codeine (TYLENOL #3) 300-30 MG tablet Take 1 tablet by mouth every 4 (four) hours as needed for moderate pain. 30 tablet 0  . amLODipine (NORVASC) 10 MG tablet Take 1 tablet (10 mg total) by mouth daily. 90 tablet 3  . Ascorbic Acid (VITAMIN C PO) Take 1 tablet by mouth daily.    . benzonatate (TESSALON) 100 MG capsule Take 2 capsules (200 mg total) by mouth 3 (three) times daily as needed for cough. 60 capsule 0  . cetirizine (ZYRTEC) 10 MG tablet Take 1 tablet (10 mg total) by mouth daily. 30 tablet 11  . Cyanocobalamin (VITAMIN B-12 PO) Take 1 tablet by mouth daily.    .  Diclofenac Sodium 3 % GEL Apply 1 each topically 2 (two) times daily. 100 g 3  . fluticasone (FLONASE) 50 MCG/ACT nasal spray Place 1 spray into both nostrils daily. 16 g 0  . gabapentin (NEURONTIN) 300 MG capsule Take 3 capsules (900 mg total) by mouth at bedtime. 90 capsule 3  . hydrochlorothiazide (HYDRODIURIL) 12.5 MG tablet Take 1 tablet (12.5 mg total) by mouth daily. 90 tablet 3  . HYDROcodone-acetaminophen (NORCO) 5-325 MG tablet Take 1 tablet by mouth every 6 (six) hours as needed for up to 5 days for severe pain. 20 tablet 0  . hydrOXYzine (ATARAX/VISTARIL) 25 MG tablet Take 1 tablet (25 mg total) by mouth at bedtime as needed (sleep). 30 tablet 3  . ibuprofen (ADVIL) 800 MG tablet Take 1 tablet (800 mg total) by mouth every 8 (eight) hours as needed for up to 30 doses for headache. 30 tablet 0  . methocarbamol (ROBAXIN) 500 MG tablet Take 2 tablets (1,000 mg total) by mouth 4 (four) times daily. 90 tablet 0  . Multiple Vitamin (MULTIVITAMIN) tablet Take 1 tablet by mouth daily.    . Omega-3 Fatty Acids (OMEGA-3 FISH OIL PO) Take 1 tablet by mouth daily.    Marland Kitchen omeprazole (PRILOSEC) 20 MG capsule Take 1 capsule (20 mg total) by mouth daily. 30 capsule 3  . potassium chloride (KLOR-CON) 10 MEQ tablet Take 1 tablet (10 mEq total) by mouth daily. 30 tablet 2  . predniSONE (STERAPRED UNI-PAK 21 TAB) 10 MG (21) TBPK tablet Take as directed 21 tablet 0  . sertraline (ZOLOFT) 50 MG tablet Take 1 tablet (50 mg total) by mouth daily. 30 tablet 1  . triamcinolone cream (KENALOG) 0.1 % Apply 1 application topically 2 (two) times daily. 30 g 0   No facility-administered medications prior to visit.    Allergies  Allergen Reactions  . Diphenhydramine Hcl Itching  . Metoprolol Palpitations    ROS Review of Systems  All other systems reviewed and are negative.     Objective:    Physical Exam  Pulmonary/Chest:      BP 118/82 (BP Location: Left Arm)   Pulse 98   Temp 98 F (36.7 C)    Resp 16   Ht 5\' 4"  (1.626 m)   Wt 188 lb (85.3 kg)   SpO2 98%   BMI 32.27 kg/m  Wt Readings from Last 3 Encounters:  10/31/19 188 lb (85.3 kg)  09/25/19 185 lb (83.9 kg)  05/24/19 176 lb (79.8 kg)     Health  Maintenance Due  Topic Date Due  . PNEUMOCOCCAL POLYSACCHARIDE VACCINE AGE 39-64 HIGH RISK  Never done  . FOOT EXAM  Never done  . OPHTHALMOLOGY EXAM  Never done  . TETANUS/TDAP  Never done  . PAP SMEAR-Modifier  Never done  . MAMMOGRAM  Never done  . COLONOSCOPY  Never done  . URINE MICROALBUMIN  11/24/2018  . INFLUENZA VACCINE  Never done  . HEMOGLOBIN A1C  07/07/2019    There are no preventive care reminders to display for this patient.  Lab Results  Component Value Date   TSH 0.502 07/31/2016   Lab Results  Component Value Date   WBC 9.0 07/14/2019   HGB 12.6 07/14/2019   HCT 39.1 07/14/2019   MCV 92.7 07/14/2019   PLT 357 07/14/2019   Lab Results  Component Value Date   NA 141 07/14/2019   K 3.7 07/14/2019   CO2 25 07/14/2019   GLUCOSE 95 07/14/2019   BUN 11 07/14/2019   CREATININE 0.89 07/14/2019   BILITOT 0.5 10/06/2018   ALKPHOS 56 10/06/2018   AST 19 10/06/2018   ALT 18 10/06/2018   PROT 7.4 10/06/2018   ALBUMIN 3.6 10/06/2018   CALCIUM 9.5 07/14/2019   ANIONGAP 12 07/14/2019   Lab Results  Component Value Date   CHOL 189 09/28/2018   Lab Results  Component Value Date   HDL 49 09/28/2018   Lab Results  Component Value Date   LDLCALC 109 (H) 09/28/2018   Lab Results  Component Value Date   TRIG 153 (H) 09/28/2018   Lab Results  Component Value Date   CHOLHDL 3.9 09/28/2018   Lab Results  Component Value Date   HGBA1C 5.8 (H) 01/04/2019      Assessment & Plan:  Inframammary fold skin tag, lole.  With a betadine prep and 1.0cc of Xylocain with  Epi, the skin tag was removed.  Covered with antibiotic ointment and Bandaid Problem List Items Addressed This Visit    None      No orders of the defined types were placed  in this encounter.   Follow-up: Return If needed for wound concerns.    Judeth Horn, MD

## 2019-10-31 NOTE — Patient Instructions (Signed)
May remove dressing in tow day.  May then shower and pat the wound dry.  Cover with antibiotic ointment and bandaid.

## 2019-10-31 NOTE — Progress Notes (Signed)
Skin tag under her l breast

## 2019-10-31 NOTE — ED Triage Notes (Signed)
Pt reports had a mole removed under her left breast. Reports has changed bandages 3 times and bleeding wont stop. Denies being on blood thinners.

## 2019-10-31 NOTE — Discharge Instructions (Signed)
Keep pressure to the wound. Follow up with Primary care tomorrow. Do not take the bandaging off the wound tonight.  Return for new or worsening symptoms.

## 2019-10-31 NOTE — Telephone Encounter (Signed)
-----   Message from Asencion Islam, [place] sent at 10/29/2019  9:59 PM EDT ----- Regarding: MRI right ankle Evaluate Achilles tendon for residual tear has chronic pain with significant scarring and hypersensitivity history of previous right heel surgery with still pain

## 2019-11-01 ENCOUNTER — Telehealth: Payer: Self-pay | Admitting: Internal Medicine

## 2019-11-01 ENCOUNTER — Encounter: Payer: Self-pay | Admitting: Family Medicine

## 2019-11-01 ENCOUNTER — Ambulatory Visit (INDEPENDENT_AMBULATORY_CARE_PROVIDER_SITE_OTHER): Payer: Medicaid Other | Admitting: Family Medicine

## 2019-11-01 DIAGNOSIS — M545 Low back pain, unspecified: Secondary | ICD-10-CM

## 2019-11-01 DIAGNOSIS — G8929 Other chronic pain: Secondary | ICD-10-CM

## 2019-11-01 DIAGNOSIS — M79605 Pain in left leg: Secondary | ICD-10-CM

## 2019-11-01 DIAGNOSIS — M79604 Pain in right leg: Secondary | ICD-10-CM

## 2019-11-01 MED ORDER — CELECOXIB 200 MG PO CAPS: 200.0000 mg | ORAL_CAPSULE | Freq: Two times a day (BID) | ORAL | 6 refills | Status: DC | PRN

## 2019-11-01 MED ORDER — BACLOFEN 10 MG PO TABS: 5.0000 mg | ORAL_TABLET | Freq: Every evening | ORAL | 3 refills | Status: DC | PRN

## 2019-11-01 MED FILL — BACLOFEN 10 MG TABLET: 10 | 30 days supply | Qty: 30 | Fill #0

## 2019-11-01 MED FILL — ?CELECOXIB 200 MG CAPSULE: 200 | 30 days supply | Qty: 60 | Fill #0

## 2019-11-01 MED FILL — ONDANSETRON ODT 4 MG TABLET: 4 | 6 days supply | Qty: 20 | Fill #0

## 2019-11-01 NOTE — Telephone Encounter (Signed)
Patient called and requested to inform pcp after the procedure that was done here in the office yesterday she had to visit the ED due to the site still bleeding after she went home. Patient stated that they put a row of stiches in on the site. Patient stated that she was experiencing headaches and spells of nauseas. Patient stated that the hospital informed her to return to [facility] to get checked out again for a follow up. Please follow up at your earliest convenience.

## 2019-11-01 NOTE — Progress Notes (Signed)
Office Visit Note   Patient: [name] [name]           Date of Birth: 10/31/1965           MRN: [id number] Visit Date: 11/01/2019 Requested by: Anders Simmonds, PA-C [address] [place],  [place] [address] PCP: Marcine Matar, MD  Subjective: Chief Complaint  Patient presents with  . Right Leg - Pain  . Left Leg - Pain    HPI: She is here with bilateral leg pain.  She has chronic low back pain for 6 or 7 years.  She has been seen at [facility] orthopedics years ago and was told that she might need lumbar surgery, they were discussing anterior and posterior approaches, but patient was not ready for surgery at that point.  She has tried physical therapy which did not help.  In the past year her symptoms are gotten worse and she is now having pain down the back of the legs to the posterior knees.  In the past 2 to 3 weeks her legs have almost "given out" when she tries to walk.  Denies bowel or bladder dysfunction.  She has been given gabapentin, Robaxin for her symptoms but she states that it does not help.               ROS:   All other systems were reviewed and are negative.  Objective: Vital Signs: There were no vitals taken for this visit.  Physical Exam:  General:  Alert and oriented, in no acute distress. Pulm:  Breathing unlabored. Psy:  Normal mood, congruent affect. Skin: No visible rash. Low back: She is tender near the L3-4 and L4-5 levels in the midline.  She has some tenderness near the left greater trochanter.  Straight leg raise is negative, lower extremity strength and reflexes are essentially normal.  She initially had a little bit of weakness with ankle dorsiflexion but she is able to walk on her heels normally.  Imaging: None today.  X-rays from last June show moderate to severe L5-S1 degenerative disc disease.   Assessment & Plan: 1.  Chronic low back pain now with bilateral leg pain and give way weakness, concerning for lumbar disc protrusion or  stenosis. -We will order a lumbar MRI scan.  Depending on the results could try epidural injection or possibly chiropractic.  She is very needle phobic. -She is given Celebrex and baclofen to take as needed.     Procedures: No procedures performed  No notes on file     PMFS History: Patient Active Problem List   Diagnosis Date Noted  . Prediabetes 01/04/2019  . Obesity (BMI 30-39.9) 01/04/2019  . Muscle spasm 09/28/2018  . Ganglion cyst 09/28/2018  . Carpal tunnel syndrome of right wrist 09/28/2018  . Chronic midline low back pain without sciatica 11/23/2017  . Essential hypertension 11/23/2017  . Chronic pain of right knee 11/23/2017  . 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 Marijuana abuse 11/23/2017  . Tobacco dependence 11/23/2017  . Alcohol abuse 07/30/2016  . S/P ACL reconstruction 01/31/2014  . DEPRESSION 11/17/2008  . ALLERGIC RHINITIS 11/17/2008  . GERD 11/17/2008   Past Medical History:  Diagnosis Date  . Asthma   . Bipolar 1 disorder (HCC)   . Chronic lumbar pain   . DDD (degenerative disc disease)   . Depression   . GERD (gastroesophageal reflux disease)   . History of gastric ulcer   . Hyperlipidemia   . Hypertension   .  Migraine   . Osteoarthritis   . Right ACL tear   . Right knee meniscal tear   . Sciatica   . Wears glasses     Family History  Problem Relation Age of Onset  . Hypertension Father     Past Surgical History:  Procedure Laterality Date  . ABDOMINAL HYSTERECTOMY    . BUNIONECTOMY Left 2011  . HEEL SPUR SURGERY Right 2012  . KNEE ARTHROSCOPY WITH ANTERIOR CRUCIATE LIGAMENT (ACL) REPAIR WITH HAMSTRING GRAFT Right 01/31/2014   Procedure: RIGHT KNEE ARTHROSCOPY WITH ALLOGRAFT (ACL) ANTERIOR CRUCIATE LIGAMENT RECONSTRUCTON PARTIAL MENISCECTOMY VERSES REPAIR ;  Surgeon: Sydnee Cabal, MD;  Location: [facility] [facility];  Service: Orthopedics;  Laterality: Right;  . TOTAL ABDOMINAL HYSTERECTOMY W/ BILATERAL  SALPINGOOPHORECTOMY  04-20-2006   Social History   Occupational History  . Not on file  Tobacco Use  . Smoking status: Current Every Day Smoker    Packs/day: 0.25    Years: 32.00    Pack years: 8.00    Types: Cigarettes  . Smokeless tobacco: Never Used  Substance and Sexual Activity  . Alcohol use: Not Currently    Alcohol/week: 14.0 standard drinks    Types: 14 Cans of beer per week    Comment: 4 40 ounce beers and liquor  . Drug use: Not Currently    Types: Marijuana    Comment: occasional  marijuana--  hx  "crack" use  last used 2004--  no treatment rehab (done on her own)  . Sexual activity: Not on file

## 2019-11-01 NOTE — Telephone Encounter (Signed)
Will forward to pcp

## 2019-11-01 NOTE — Progress Notes (Signed)
Complains of bilateral leg pain and muscle spasms, also buttock pain. This has been going on since last year. She states no known injury.

## 2019-11-01 NOTE — Telephone Encounter (Signed)
Called pt / stated everything is fine today / went to ED and steri strips were applied.  Will come for a f /u next week

## 2019-11-05 LAB — ANATOMIC PATHOLOGY REPORT: PDF Image: 0

## 2019-11-07 ENCOUNTER — Other Ambulatory Visit: Payer: Medicaid Other

## 2019-11-08 ENCOUNTER — Encounter (HOSPITAL_COMMUNITY): Payer: Self-pay

## 2019-11-08 ENCOUNTER — Ambulatory Visit (HOSPITAL_COMMUNITY): Admission: RE | Admit: 2019-11-08 | Payer: Medicaid Other | Source: Ambulatory Visit

## 2019-11-12 NOTE — Progress Notes (Signed)
NEUROLOGY CONSULTATION NOTE  [name] [name] MRN: [id number] DOB: 1966-06-14  Referring provider: Georgian Co, PA-C Primary care provider: Jonah Blue, MD  Reason for consult:  headache  HISTORY OF PRESENT ILLNESS: [name] [name] is a 54 year old female with HTN, HLD, chronic low back pain, osteoarthritis, Bipolar 1 disorder, depression and asthma who presents for headaches.  History supplemented by ED and primary care notes.  For the past year, she has had daily headaches which are moderate non-throbbing pain in her temples but sometimes occipital region.  She wakes up with the headache.  They last 2 hours and occur up to 3 times daily.  No associated nausea, vomiting, visual disturbance, unilateral numbness or weakness.  Tylenol, ibuprofen and Goody powder, which she takes daily, are minimally effective.  Triggers include stress.  Relieving factors include massage of her temples.  She does have past history of migraines as well, which are severe throbbing headache, often right posterior with nausea, photophobia and phonophobia, lasting 2 hours and occurs 7 days a month.  She required ED visit for treatment on 06/27/2019.  Current NSAIDS:  Ibuprofen 800mg ; Celebrex Current analgesics:  Tylenol; BC or Goodys Current triptans:  none Current ergotamine:  none Current anti-emetic:  Zofran ODT 4mg  Current muscle relaxants:  Baclofen PRN (back spasms); Robaxin (back spasms) Current anti-anxiolytic:  hydroxyzine Current sleep aide:  none Current Antihypertensive medications:  Amlodipine; HCTZ Current Antidepressant medications:  Sertraline 50mg  daily (not compliant) Current Anticonvulsant medications:  Gabapentin 900mg  at bedtime (leg pain) Current anti-CGRP:  none Current Vitamins/Herbal/Supplements:  B12; MVI Current Antihistamines/Decongestants:  Flonase; Zyrtec Other therapy:  none Hormone/birth control:  none  Past NSAIDS:  Naproxen 500mg  Past analgesics:  none Past  abortive triptans:  none Past abortive ergotamine:  none Past muscle relaxants:  Flexeril Past anti-emetic:  none Past antihypertensive medications:  Toprol XL Past antidepressant medications:  none Past anticonvulsant medications:  lamotrigine Past anti-CGRP:  none Past vitamins/Herbal/Supplements:  none Past antihistamines/decongestants:  none Other past therapies:  none  Caffeine:  Mt Dew daily.  Rarely coffee Diet:  Drinks a lot of water.  Does not skip meals. Exercise:  walks Depression:  no; Anxiety:  A little bit Other pain:  As above Sleep hygiene:  variable Family history of headache:  Daughter (migraines)  CBC and BMP from 07/14/2019 were normal.   PAST MEDICAL HISTORY: Past Medical History:  Diagnosis Date  . Asthma   . Bipolar 1 disorder (HCC)   . Chronic lumbar pain   . DDD (degenerative disc disease)   . Depression   . GERD (gastroesophageal reflux disease)   . History of gastric ulcer   . Hyperlipidemia   . Hypertension   . Migraine   . Osteoarthritis   . Right ACL tear   . Right knee meniscal tear   . Sciatica   . Wears glasses     PAST SURGICAL HISTORY: Past Surgical History:  Procedure Laterality Date  . ABDOMINAL HYSTERECTOMY    . BUNIONECTOMY Left 2011  . HEEL SPUR SURGERY Right 2012  . KNEE ARTHROSCOPY WITH ANTERIOR CRUCIATE LIGAMENT (ACL) REPAIR WITH HAMSTRING GRAFT Right 01/31/2014   Procedure: RIGHT KNEE ARTHROSCOPY WITH ALLOGRAFT (ACL) ANTERIOR CRUCIATE LIGAMENT RECONSTRUCTON PARTIAL MENISCECTOMY VERSES REPAIR ;  Surgeon: , MD;  Location: [facility];  Service: Orthopedics;  Laterality: Right;  . TOTAL ABDOMINAL HYSTERECTOMY W/ BILATERAL SALPINGOOPHORECTOMY  04-20-2006    MEDICATIONS: Current Outpatient Medications on File Prior to Visit  Medication Sig  Dispense Refill  . acetaminophen (TYLENOL) 500 MG tablet Take 1 tablet (500 mg total) by mouth every 6 (six) hours as needed for up to 30 doses. 30 tablet 0   . acetaminophen-codeine (TYLENOL #3) 300-30 MG tablet Take 1 tablet by mouth every 4 (four) hours as needed for moderate pain. 30 tablet 0  . amLODipine (NORVASC) 10 MG tablet Take 1 tablet (10 mg total) by mouth daily. 90 tablet 3  . Ascorbic Acid (VITAMIN C PO) Take 1 tablet by mouth daily.    . baclofen (LIORESAL) 10 MG tablet Take 0.5-1 tablets (5-10 mg total) by mouth at bedtime as needed for muscle spasms. 30 each 3  . benzonatate (TESSALON) 100 MG capsule Take 2 capsules (200 mg total) by mouth 3 (three) times daily as needed for cough. 60 capsule 0  . celecoxib (CELEBREX) 200 MG capsule Take 1 capsule (200 mg total) by mouth 2 (two) times daily as needed. 60 capsule 6  . cetirizine (ZYRTEC) 10 MG tablet Take 1 tablet (10 mg total) by mouth daily. 30 tablet 11  . Cyanocobalamin (VITAMIN B-12 PO) Take 1 tablet by mouth daily.    . Diclofenac Sodium 3 % GEL Apply 1 each topically 2 (two) times daily. 100 g 3  . fluticasone (FLONASE) 50 MCG/ACT nasal spray Place 1 spray into both nostrils daily. 16 g 0  . gabapentin (NEURONTIN) 300 MG capsule Take 3 capsules (900 mg total) by mouth at bedtime. 90 capsule 3  . hydrochlorothiazide (HYDRODIURIL) 12.5 MG tablet Take 1 tablet (12.5 mg total) by mouth daily. 90 tablet 3  . hydrOXYzine (ATARAX/VISTARIL) 25 MG tablet Take 1 tablet (25 mg total) by mouth at bedtime as needed (sleep). 30 tablet 3  . ibuprofen (ADVIL) 800 MG tablet Take 1 tablet (800 mg total) by mouth every 8 (eight) hours as needed for up to 30 doses for headache. 30 tablet 0  . methocarbamol (ROBAXIN) 500 MG tablet Take 2 tablets (1,000 mg total) by mouth 4 (four) times daily. 90 tablet 0  . Multiple Vitamin (MULTIVITAMIN) tablet Take 1 tablet by mouth daily.    . Omega-3 Fatty Acids (OMEGA-3 FISH OIL PO) Take 1 tablet by mouth daily.    Marland Kitchen omeprazole (PRILOSEC) 20 MG capsule Take 1 capsule (20 mg total) by mouth daily. 30 capsule 3  . ondansetron (ZOFRAN ODT) 4 MG disintegrating  tablet Take 1 tablet (4 mg total) by mouth every 8 (eight) hours as needed for nausea or vomiting. 20 tablet 0  . potassium chloride (KLOR-CON) 10 MEQ tablet Take 1 tablet (10 mEq total) by mouth daily. 30 tablet 2  . predniSONE (STERAPRED UNI-PAK 21 TAB) 10 MG (21) TBPK tablet Take as directed 21 tablet 0  . sertraline (ZOLOFT) 50 MG tablet Take 1 tablet (50 mg total) by mouth daily. 30 tablet 1  . triamcinolone cream (KENALOG) 0.1 % Apply 1 application topically 2 (two) times daily. 30 g 0   No current facility-administered medications on file prior to visit.    ALLERGIES: Allergies  Allergen Reactions  . Diphenhydramine Hcl Itching  . Metoprolol Palpitations    FAMILY HISTORY: Family History  Problem Relation Age of Onset  . Hypertension Father     SOCIAL HISTORY: Social History   Socioeconomic History  . Marital status: Legally Separated    Spouse name: Not on file  . Number of children: Not on file  . Years of education: Not on file  . Highest education level: Not on  file  Occupational History  . Not on file  Tobacco Use  . Smoking status: Current Every Day Smoker    Packs/day: 0.25    Years: 32.00    Pack years: 8.00    Types: Cigarettes  . Smokeless tobacco: Never Used  Substance and Sexual Activity  . Alcohol use: Not Currently    Alcohol/week: 14.0 standard drinks    Types: 14 Cans of beer per week    Comment: 4 40 ounce beers and liquor  . Drug use: Not Currently    Types: Marijuana    Comment: occasional  marijuana--  hx  "crack" use  last used 2004--  no treatment rehab (done on her own)  . Sexual activity: Not on file  Other Topics Concern  . Not on file  Social History Narrative  . Not on file   Social Determinants of Health   Financial Resource Strain:   . Difficulty of Paying Living Expenses:   Food Insecurity:   . Worried About Programme researcher, broadcasting/film/video in the Last Year:   . Barista in the Last Year:   Transportation Needs:   . Automotive engineer (Medical):   Marland Kitchen Lack of Transportation (Non-Medical):   Physical Activity:   . Days of Exercise per Week:   . Minutes of Exercise per Session:   Stress:   . Feeling of Stress :   Social Connections:   . Frequency of Communication with Friends and Family:   . Frequency of Social Gatherings with Friends and Family:   . Attends Religious Services:   . Active Member of Clubs or Organizations:   . Attends Banker Meetings:   Marland Kitchen Marital Status:   Intimate Partner Violence:   . Fear of Current or Ex-Partner:   . Emotionally Abused:   Marland Kitchen Physically Abused:   . Sexually Abused:     PHYSICAL EXAM: Blood pressure (!) 152/94, pulse 88, height 5\' 4"  (1.626 m), weight 189 lb (85.7 kg), SpO2 98 %. General: No acute distress.  Patient appears well-groomed.  Head:  Normocephalic/atraumatic Eyes:  fundi examined but not visualized Neck: supple, no paraspinal tenderness, full range of motion Back: No paraspinal tenderness Heart: regular rate and rhythm Lungs: Clear to auscultation bilaterally. Vascular: No carotid bruits. Neurological Exam: Mental status: alert and oriented to person, place, and time, recent and remote memory intact, fund of knowledge intact, attention and concentration intact, speech fluent and not dysarthric, language intact. Cranial nerves: CN I: not tested CN II: pupils equal, round and reactive to light, visual fields intact CN III, IV, VI:  full range of motion, no nystagmus, no ptosis CN V: facial sensation intact CN VII: upper and lower face symmetric CN VIII: hearing intact CN IX, X: gag intact, uvula midline CN XI: sternocleidomastoid and trapezius muscles intact CN XII: tongue midline Bulk & Tone: normal, no fasciculations. Motor:  5/5 throughout  Sensation:  Pinprick and vibration sensation intact. Deep Tendon Reflexes:  2+ throughout, toes downgoing.  Finger to nose testing:  Without dysmetria.  Heel to shin:  Without dysmetria.   Gait:  Normal station and stride.  Romberg negative.  IMPRESSION: 1.  Chronic migraine without aura, without status migrainosus, intractable 2.  Medication-overuse headache  PLAN: 1.  For preventative management, start topiramate 25mg  at bedtime.  We can increase to 50mg  at bedtime in 4 weeks if needed. 2.  For abortive therapy, sumatriptan 100mg  3. Advised to stop all over the counter analgesics. 4.  Limit use of pain relievers to no more than 2 days out of week to prevent risk of rebound or medication-overuse headache. 5.  Keep headache diary 6.  Exercise, hydration, caffeine cessation, sleep hygiene, monitor for and avoid triggers 7.  Consider:  magnesium citrate 400mg  daily, riboflavin 400mg  daily, and coenzyme Q10 100mg  three times daily 8. Follow up 4 months   Thank you for allowing me to take part in the care of this patient.  , DO  CC:  , MD  , PA-C

## 2019-11-13 ENCOUNTER — Ambulatory Visit (INDEPENDENT_AMBULATORY_CARE_PROVIDER_SITE_OTHER): Payer: Self-pay | Admitting: Neurology

## 2019-11-13 ENCOUNTER — Other Ambulatory Visit: Payer: Self-pay

## 2019-11-13 ENCOUNTER — Encounter: Payer: Self-pay | Admitting: Neurology

## 2019-11-13 VITALS — BP 152/94 | HR 88 | Ht 64.0 in | Wt 189.0 lb

## 2019-11-13 DIAGNOSIS — G43719 Chronic migraine without aura, intractable, without status migrainosus: Secondary | ICD-10-CM

## 2019-11-13 DIAGNOSIS — G4440 Drug-induced headache, not elsewhere classified, not intractable: Secondary | ICD-10-CM

## 2019-11-13 MED ORDER — SUMATRIPTAN SUCCINATE 100 MG PO TABS: ORAL_TABLET | ORAL | 3 refills | Status: DC

## 2019-11-13 MED ORDER — TOPIRAMATE 25 MG PO TABS: 25.0000 mg | ORAL_TABLET | Freq: Every day | ORAL | 3 refills | Status: DC

## 2019-11-13 NOTE — Patient Instructions (Signed)
Migraine Recommendations: 1.  Start topiramate 25mg  at bedtime.  Call in 4 weeks with update and we can adjust dose if needed. 2.  Take sumatriptan 100mg  at earliest onset of headache.  May repeat dose once in 2 hours if needed.  Do not exceed two tablets in 24 hours. 3.  STOP ALL OVER THE COUNTER ANALGESICS (BC, GOODYS, TYLENOL, MOTRIN).  Limit use of pain relievers to no more than 2 days out of the week.  These medications include acetaminophen, ibuprofen, triptans and narcotics.  This will help reduce risk of rebound headaches. 4.  Be aware of common food triggers such as processed sweets, processed foods with nitrites (such as deli meat, hot dogs, sausages), foods with MSG, alcohol (such as wine), chocolate, certain cheeses, certain fruits (dried fruits, bananas, pineapple), vinegar, diet soda. 4.  Avoid caffeine 5.  Routine exercise 6.  Proper sleep hygiene 7.  Stay adequately hydrated with water 8.  Keep a headache diary. 9.  Maintain proper stress management. 10.  Do not skip meals. 11.  Consider supplements:  Magnesium citrate 400mg  to 600mg  daily, riboflavin 400mg , Coenzyme Q 10 100mg  three times daily 12.  Follow up in 4 months

## 2019-11-18 ENCOUNTER — Telehealth: Payer: Self-pay | Admitting: Internal Medicine

## 2019-11-18 ENCOUNTER — Other Ambulatory Visit: Payer: Self-pay

## 2019-11-18 MED ORDER — POLYETHYLENE GLYCOL 3350 17 GM/SCOOP PO POWD: 17.0000 g | Freq: Every day | ORAL | 1 refills | Status: DC | PRN

## 2019-11-18 MED FILL — POLYETHYLENE GLYCOL 3350 PO: 17 | 14 days supply | Qty: 238 | Fill #0

## 2019-11-18 NOTE — Telephone Encounter (Signed)
Contacted pt and went over Dr. Laural Benes response. Pt requested that rx be sent to [facility]. Resent rx to [facility]. Pt doesn't have any questions or concerns

## 2019-11-18 NOTE — Telephone Encounter (Signed)
Patient has had a blow movent in two weeks and she would like  Lactulose or something to help her go

## 2019-11-20 ENCOUNTER — Telehealth: Payer: Self-pay | Admitting: Neurology

## 2019-11-20 ENCOUNTER — Other Ambulatory Visit: Payer: Self-pay | Admitting: Neurology

## 2019-11-20 MED ORDER — TOPIRAMATE 50 MG PO TABS: 50.0000 mg | ORAL_TABLET | Freq: Every day | ORAL | 3 refills | Status: DC

## 2019-11-20 NOTE — Telephone Encounter (Signed)
Sent prescription for topiramate 50mg  at bedtime. Prior to starting new prescription, she may finish current bottle of 25mg  tablets by taking 2 tablets at bedtime (for total of 50mg  daily).  If no improvement in headaches by the end of the new prescription, then she should contact and we can continue increasing dose but she needs to finish the prescription before determining if any further changes are needed.

## 2019-11-20 NOTE — Telephone Encounter (Signed)
The following message was left with [employer] on 11/20/19 at 12:19 PM.  Caller states that she was prescribed Topirimate 25 MG. She said the doctor mentioned she may need an increase in the dosage, she would like an increase.   Caller stated headaches are still occurring and my even be getting worse.  [facility].

## 2019-11-21 ENCOUNTER — Other Ambulatory Visit: Payer: Self-pay

## 2019-11-21 ENCOUNTER — Ambulatory Visit (HOSPITAL_COMMUNITY)
Admission: RE | Admit: 2019-11-21 | Discharge: 2019-11-21 | Disposition: A | Discharge status: Home / Self Care | Payer: Self-pay | Source: Ambulatory Visit | Attending: Family Medicine | Admitting: Family Medicine

## 2019-11-21 ENCOUNTER — Ambulatory Visit (HOSPITAL_COMMUNITY)
Admission: RE | Admit: 2019-11-21 | Discharge: 2019-11-21 | Disposition: A | Discharge status: Home / Self Care | Payer: Self-pay | Source: Ambulatory Visit | Attending: Sports Medicine | Admitting: Sports Medicine

## 2019-11-21 DIAGNOSIS — M79605 Pain in left leg: Secondary | ICD-10-CM | POA: Insufficient documentation

## 2019-11-21 DIAGNOSIS — M545 Low back pain, unspecified: Secondary | ICD-10-CM

## 2019-11-21 DIAGNOSIS — M7661 Achilles tendinitis, right leg: Secondary | ICD-10-CM | POA: Insufficient documentation

## 2019-11-21 DIAGNOSIS — L905 Scar conditions and fibrosis of skin: Secondary | ICD-10-CM | POA: Insufficient documentation

## 2019-11-21 DIAGNOSIS — M79604 Pain in right leg: Secondary | ICD-10-CM | POA: Insufficient documentation

## 2019-11-21 DIAGNOSIS — S86011S Strain of right Achilles tendon, sequela: Secondary | ICD-10-CM | POA: Insufficient documentation

## 2019-11-21 DIAGNOSIS — G8929 Other chronic pain: Secondary | ICD-10-CM

## 2019-11-21 DIAGNOSIS — R52 Pain, unspecified: Secondary | ICD-10-CM | POA: Insufficient documentation

## 2019-11-21 NOTE — Telephone Encounter (Signed)
Telephone call to pt, Advised of Dr. Everlena Cooper note below.

## 2019-11-22 ENCOUNTER — Telehealth: Payer: Self-pay | Admitting: Family Medicine

## 2019-11-22 NOTE — Telephone Encounter (Signed)
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    Could contemplate referral for lumbar ESI, or possibly a trial of chiropractic.    If symptoms don't improve, then surgical consult.

## 2019-11-25 NOTE — Telephone Encounter (Signed)
The patient would like to go ahead and have the surgical consult.

## 2019-11-26 ENCOUNTER — Telehealth: Payer: Self-pay

## 2019-11-26 ENCOUNTER — Encounter: Payer: Self-pay | Admitting: Internal Medicine

## 2019-11-26 ENCOUNTER — Other Ambulatory Visit: Payer: Self-pay

## 2019-11-26 ENCOUNTER — Ambulatory Visit: Payer: Self-pay | Attending: Internal Medicine | Admitting: Internal Medicine

## 2019-11-26 DIAGNOSIS — M79605 Pain in left leg: Secondary | ICD-10-CM

## 2019-11-26 DIAGNOSIS — R059 Cough, unspecified: Secondary | ICD-10-CM

## 2019-11-26 DIAGNOSIS — R05 Cough: Secondary | ICD-10-CM

## 2019-11-26 DIAGNOSIS — M79604 Pain in right leg: Secondary | ICD-10-CM

## 2019-11-26 NOTE — Telephone Encounter (Signed)
Appointment made

## 2019-11-26 NOTE — Progress Notes (Signed)
Virtual Visit via Telephone Note  I connected with [name] [name] on 11/26/19 at  2:00 PM EDT by telephone and verified that I am speaking with the correct person using two identifiers.  Location: Patient: home Provider:  home   I discussed the limitations, risks, security and privacy concerns of performing an evaluation and management service by telephone and the availability of in person appointments. I also discussed with the patient that there may be a patient responsible charge related to this service. The patient expressed understanding and agreed to proceed.   History of Present Illness: Pt c/o cough x 1 year, dry, no f/c, no c/p, no change in sx noted. Cough improved with claritin in past, currently taking benzonatate prn. She is a smoker and has reduced number of cigs to 5/day. Reports no lung exam/cxr in past year.  Also c/o leg spasm, recently evaluated by ortho, scheduled to see surgeon.   Observations/Objective:   Assessment and Plan: 1. Cough- discussed ddx, tx options. Pt prefers claritin, advised to d/c zyrtec when she starts claritin. Advised f41f visit for lung exam and cxr to r/o other etiology of sx. Pt agrees and will call back to schedule  2. Leg pain, bilateral Referred pain from spine, advised f/u with surgeon as scheduled.    Follow Up Instructions:    I discussed the assessment and treatment plan with the patient. The patient was provided an opportunity to ask questions and all were answered. The patient agreed with the plan and demonstrated an understanding of the instructions.   The patient was advised to call back or seek an in-person evaluation if the symptoms worsen or if the condition fails to improve as anticipated.  I provided 20 minutes of non-face-to-face time during this encounter.   Debby Bud, MD

## 2019-11-26 NOTE — Telephone Encounter (Signed)
I left voicemail advising ok for boyfriend to attend visit. Note made in her appt notes.

## 2019-11-26 NOTE — Progress Notes (Signed)
Pt states she has the cough for 3 weeks   Pt states she keeps having spasms in her legs

## 2019-11-26 NOTE — Telephone Encounter (Signed)
Patient called and wanted to know if her boyfriend can come to her appointment with her since it is a surgical discussion.   Please call pt back to advise

## 2019-11-27 ENCOUNTER — Other Ambulatory Visit: Payer: Medicaid Other

## 2019-12-04 ENCOUNTER — Ambulatory Visit (INDEPENDENT_AMBULATORY_CARE_PROVIDER_SITE_OTHER): Payer: Self-pay

## 2019-12-04 ENCOUNTER — Encounter: Payer: Self-pay | Admitting: Orthopaedic Surgery

## 2019-12-04 ENCOUNTER — Other Ambulatory Visit: Payer: Self-pay

## 2019-12-04 ENCOUNTER — Ambulatory Visit (INDEPENDENT_AMBULATORY_CARE_PROVIDER_SITE_OTHER): Payer: Self-pay | Admitting: Orthopaedic Surgery

## 2019-12-04 VITALS — BP 156/102 | HR 63 | Ht 64.0 in | Wt 192.0 lb

## 2019-12-04 DIAGNOSIS — M545 Low back pain, unspecified: Secondary | ICD-10-CM

## 2019-12-04 DIAGNOSIS — G8929 Other chronic pain: Secondary | ICD-10-CM

## 2019-12-04 NOTE — Progress Notes (Signed)
Office Visit Note   Patient: [name] [name]           Date of Birth: [birth year]           MRN: [id number] Visit Date: 12/04/2019              Requested by: Marcine Matar, MD [address] [place],  [place] [address] PCP: Marcine Matar, MD   Assessment & Plan: Visit Diagnoses:  1. Chronic midline low back pain, unspecified whether sciatica present     Plan: Patient has stenosis anterolisthesis and old 4-5 with chronic facet changes.  With the changes on supine position with reduction and upright position with grade 1 anterolisthesis she would likely need a fusion for stabilization of this.  Smoking cessation discussed.  We will check her back again in 4weeks.  Follow-Up Instructions: No follow-ups on file.   Orders:  Orders Placed This Encounter  Procedures  . XR Lumb Spine Flex&Ext Only   No orders of the defined types were placed in this encounter.     Procedures: No procedures performed   Clinical Data: No additional findings.   Subjective: Chief Complaint  Patient presents with  . Lower Back - Pain    HPI 54 year old female has been followed by Dr. Prince Rome with problems with chronic back pain and difficulty walking.  Patient has not worked in over 5 years states she has been turned down for so security disability probably 11 times.  She has been on Celebrex and baclofen.  She states Norco did not help.  She has had past history with bipolar disorder depression asthma chronic migraines followed by Dr.Jaffe.  Patient is here with her boyfriend.  She states she has trouble walking cannot walk very far leg seem to get weak she gets better when she sits but states she still has some pain when she sits.  Years ago she did job when she was standing a lot.  She denies bowel or bladder symptoms.  She denies fever chills.  Patient currently is a daily smoker.  History of alcohol abuse prediabetes, marijuana use.  Review of Systems is systems positive for GERD not  currently symptomatic previous ACL reconstruction.  Carpal tunnel chronic back pain.  Otherwise negative as pertains HPI   Objective: Vital Signs: BP (!) 156/102   Pulse 63   Ht 5\' 4"  (1.626 m)   Wt 192 lb (87.1 kg)   BMI 32.96 kg/m   Physical Exam Constitutional:      Appearance: She is well-developed.  HENT:     Head: Normocephalic.     Right Ear: External ear normal.     Left Ear: External ear normal.  Eyes:     Pupils: Pupils are equal, round, and reactive to light.  Neck:     Thyroid: No thyromegaly.     Trachea: No tracheal deviation.  Cardiovascular:     Rate and Rhythm: Normal rate.  Pulmonary:     Effort: Pulmonary effort is normal.  Abdominal:     Palpations: Abdomen is soft.  Skin:    General: Skin is warm and dry.  Neurological:     Mental Status: She is alert and oriented to person, place, and time.  Psychiatric:        Behavior: Behavior normal.     Ortho Exam patient ambulates with slow deliberate gait she is able to walk on her toes and heels but states it causes pain in her back.  Negative straight  leg raising to 90 degrees negative popliteal compression test negative logroll to the hips knee and ankle jerk are intact she has pain with palpation of the lumbar spine.  Distal pulses are intact.  Specialty Comments:  No specialty comments available.  Imaging: No results found.   PMFS History: Patient Active Problem List   Diagnosis Date Noted  . Prediabetes 01/04/2019  . Obesity (BMI 30-39.9) 01/04/2019  . Muscle spasm 09/28/2018  . Ganglion cyst 09/28/2018  . Carpal tunnel syndrome of right wrist 09/28/2018  . Chronic midline low back pain without sciatica 11/23/2017  . Essential hypertension 11/23/2017  . Chronic pain of right knee 11/23/2017  . 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 Marijuana abuse 11/23/2017  . Tobacco dependence 11/23/2017  . Alcohol abuse 07/30/2016  . S/P ACL reconstruction 01/31/2014  . DEPRESSION 11/17/2008    . ALLERGIC RHINITIS 11/17/2008  . GERD 11/17/2008   Past Medical History:  Diagnosis Date  . Asthma   . Bipolar 1 disorder ([place])   . Chronic lumbar pain   . DDD (degenerative disc disease)   . Depression   . GERD (gastroesophageal reflux disease)   . History of gastric ulcer   . Hyperlipidemia   . Hypertension   . Migraine   . Osteoarthritis   . Right ACL tear   . Right knee meniscal tear   . Sciatica   . Wears glasses     Family History  Problem Relation Age of Onset  . Hypertension Father     Past Surgical History:  Procedure Laterality Date  . ABDOMINAL HYSTERECTOMY    . BUNIONECTOMY Left 2011  . HEEL SPUR SURGERY Right 2012  . KNEE ARTHROSCOPY WITH ANTERIOR CRUCIATE LIGAMENT (ACL) REPAIR WITH HAMSTRING GRAFT Right 01/31/2014   Procedure: RIGHT KNEE ARTHROSCOPY WITH ALLOGRAFT (ACL) ANTERIOR CRUCIATE LIGAMENT RECONSTRUCTON PARTIAL MENISCECTOMY VERSES REPAIR ;  Surgeon: Sydnee Cabal, MD;  Location: [facility];  Service: Orthopedics;  Laterality: Right;  . TOTAL ABDOMINAL HYSTERECTOMY W/ BILATERAL SALPINGOOPHORECTOMY  04-20-2006   Social History   Occupational History  . Not on file  Tobacco Use  . Smoking status: Current Every Day Smoker    Packs/day: 0.25    Years: 32.00    Pack years: 8.00    Types: Cigarettes  . Smokeless tobacco: Never Used  Substance and Sexual Activity  . Alcohol use: Not Currently    Alcohol/week: 14.0 standard drinks    Types: 14 Cans of beer per week    Comment: 4 40 ounce beers and liquor  . Drug use: Not Currently    Types: Marijuana    Comment: occasional  marijuana--  hx  "crack" use  last used 2004--  no treatment rehab (done on her own)  . Sexual activity: Not on file

## 2019-12-05 ENCOUNTER — Encounter: Payer: Self-pay | Admitting: Orthopaedic Surgery

## 2019-12-05 MED FILL — hydrOXYzine HCL 25 MG TABS: 25 | 30 days supply | Qty: 30 | Fill #1

## 2019-12-10 ENCOUNTER — Encounter: Payer: Self-pay | Admitting: Sports Medicine

## 2019-12-10 ENCOUNTER — Telehealth: Payer: Self-pay | Admitting: Internal Medicine

## 2019-12-10 ENCOUNTER — Other Ambulatory Visit: Payer: Self-pay

## 2019-12-10 ENCOUNTER — Ambulatory Visit: Payer: No Typology Code available for payment source | Admitting: Sports Medicine

## 2019-12-10 VITALS — Temp 97.7°F

## 2019-12-10 DIAGNOSIS — M7731 Calcaneal spur, right foot: Secondary | ICD-10-CM

## 2019-12-10 DIAGNOSIS — M79671 Pain in right foot: Secondary | ICD-10-CM

## 2019-12-10 DIAGNOSIS — M722 Plantar fascial fibromatosis: Secondary | ICD-10-CM

## 2019-12-10 DIAGNOSIS — M7661 Achilles tendinitis, right leg: Secondary | ICD-10-CM

## 2019-12-10 DIAGNOSIS — L905 Scar conditions and fibrosis of skin: Secondary | ICD-10-CM

## 2019-12-10 DIAGNOSIS — R52 Pain, unspecified: Secondary | ICD-10-CM

## 2019-12-10 NOTE — Telephone Encounter (Signed)
Patient dropped of paperwork needing to be completed by pcp.Patient stated she is scheduled to have surgery on 12-23-19 and paperwork is required before surgery.

## 2019-12-10 NOTE — Progress Notes (Signed)
Subjective: [name] [name] is a 54 y.o. female patient returns to office with complaint of moderate heel pain on the right and pain at back of heel that still has not gotten better. Patient reports that she is here for MRI results. No other pedal complaints noted.   Patient Active Problem List   Diagnosis Date Noted  . Prediabetes 01/04/2019  . Obesity (BMI 30-39.9) 01/04/2019  . Muscle spasm 09/28/2018  . Ganglion cyst 09/28/2018  . Carpal tunnel syndrome of right wrist 09/28/2018  . Chronic midline low back pain without sciatica 11/23/2017  . Essential hypertension 11/23/2017  . Chronic pain of right knee 11/23/2017  . 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 Marijuana abuse 11/23/2017  . Tobacco dependence 11/23/2017  . Alcohol abuse 07/30/2016  . S/P ACL reconstruction 01/31/2014  . DEPRESSION 11/17/2008  . ALLERGIC RHINITIS 11/17/2008  . GERD 11/17/2008    Current Outpatient Medications on File Prior to Visit  Medication Sig Dispense Refill  . acetaminophen (TYLENOL) 500 MG tablet Take 1 tablet (500 mg total) by mouth every 6 (six) hours as needed for up to 30 doses. 30 tablet 0  . acetaminophen-codeine (TYLENOL #3) 300-30 MG tablet Take 1 tablet by mouth every 4 (four) hours as needed for moderate pain. 30 tablet 0  . amLODipine (NORVASC) 10 MG tablet Take 1 tablet (10 mg total) by mouth daily. 90 tablet 3  . Ascorbic Acid (VITAMIN C PO) Take 1 tablet by mouth daily.    . baclofen (LIORESAL) 10 MG tablet Take 0.5-1 tablets (5-10 mg total) by mouth at bedtime as needed for muscle spasms. 30 each 3  . benzonatate (TESSALON) 100 MG capsule Take 2 capsules (200 mg total) by mouth 3 (three) times daily as needed for cough. 60 capsule 0  . celecoxib (CELEBREX) 200 MG capsule Take 1 capsule (200 mg total) by mouth 2 (two) times daily as needed. 60 capsule 6  . cetirizine (ZYRTEC) 10 MG tablet Take 1 tablet (10 mg total) by mouth daily. 30 tablet 11  . Cyanocobalamin (VITAMIN  B-12 PO) Take 1 tablet by mouth daily.    . Diclofenac Sodium 3 % GEL Apply 1 each topically 2 (two) times daily. 100 g 3  . fluticasone (FLONASE) 50 MCG/ACT nasal spray Place 1 spray into both nostrils daily. 16 g 0  . gabapentin (NEURONTIN) 300 MG capsule Take 3 capsules (900 mg total) by mouth at bedtime. 90 capsule 3  . hydrochlorothiazide (HYDRODIURIL) 12.5 MG tablet Take 1 tablet (12.5 mg total) by mouth daily. 90 tablet 3  . hydrOXYzine (ATARAX/VISTARIL) 25 MG tablet Take 1 tablet (25 mg total) by mouth at bedtime as needed (sleep). 30 tablet 3  . ibuprofen (ADVIL) 800 MG tablet Take 1 tablet (800 mg total) by mouth every 8 (eight) hours as needed for up to 30 doses for headache. 30 tablet 0  . methocarbamol (ROBAXIN) 500 MG tablet Take 2 tablets (1,000 mg total) by mouth 4 (four) times daily. 90 tablet 0  . Multiple Vitamin (MULTIVITAMIN) tablet Take 1 tablet by mouth daily.    . Omega-3 Fatty Acids (OMEGA-3 FISH OIL PO) Take 1 tablet by mouth daily.    Marland Kitchen omeprazole (PRILOSEC) 20 MG capsule Take 1 capsule (20 mg total) by mouth daily. 30 capsule 3  . ondansetron (ZOFRAN ODT) 4 MG disintegrating tablet Take 1 tablet (4 mg total) by mouth every 8 (eight) hours as needed for nausea or vomiting. 20 tablet 0  . polyethylene glycol powder (  GLYCOLAX/MIRALAX) 17 GM/SCOOP powder Take 17 g by mouth daily as needed for mild constipation. 3350 g 1  . potassium chloride (KLOR-CON) 10 MEQ tablet Take 1 tablet (10 mEq total) by mouth daily. 30 tablet 2  . predniSONE (STERAPRED UNI-PAK 21 TAB) 10 MG (21) TBPK tablet Take as directed 21 tablet 0  . sertraline (ZOLOFT) 50 MG tablet Take 1 tablet (50 mg total) by mouth daily. 30 tablet 1  . SUMAtriptan (IMITREX) 100 MG tablet Take 1 tablet earliest onset of migraine.  May repeat in 2 hours if headache persists or recurs.  Maximum 2 tablets in 24 hours. 10 tablet 3  . topiramate (TOPAMAX) 50 MG tablet Take 1 tablet (50 mg total) by mouth at bedtime. 30 tablet 3   . triamcinolone cream (KENALOG) 0.1 % Apply 1 application topically 2 (two) times daily. 30 g 0   No current facility-administered medications on file prior to visit.    Allergies  Allergen Reactions  . Diphenhydramine Hcl Itching  . Metoprolol Palpitations   Social History   Socioeconomic History  . Marital status: Legally Separated    Spouse name: Not on file  . Number of children: Not on file  . Years of education: Not on file  . Highest education level: Not on file  Occupational History  . Not on file  Tobacco Use  . Smoking status: Current Every Day Smoker    Packs/day: 0.25    Years: 32.00    Pack years: 8.00    Types: Cigarettes  . Smokeless tobacco: Never Used  Substance and Sexual Activity  . Alcohol use: Not Currently    Alcohol/week: 14.0 standard drinks    Types: 14 Cans of beer per week    Comment: 4 40 ounce beers and liquor  . Drug use: Not Currently    Types: Marijuana    Comment: occasional  marijuana--  hx  "crack" use  last used 2004--  no treatment rehab (done on her own)  . Sexual activity: Not on file  Other Topics Concern  . Not on file  Social History Narrative  . Not on file   Social Determinants of Health   Financial Resource Strain:   . Difficulty of Paying Living Expenses:   Food Insecurity:   . Worried About Programme researcher, broadcasting/film/video in the Last Year:   . Barista in the Last Year:   Transportation Needs:   . Freight forwarder (Medical):   Marland Kitchen Lack of Transportation (Non-Medical):   Physical Activity:   . Days of Exercise per Week:   . Minutes of Exercise per Session:   Stress:   . Feeling of Stress :   Social Connections:   . Frequency of Communication with Friends and Family:   . Frequency of Social Gatherings with Friends and Family:   . Attends Religious Services:   . Active Member of Clubs or Organizations:   . Attends Banker Meetings:   Marland Kitchen Marital Status:     Family History  Problem Relation Age of  Onset  . Hypertension Father     Past Surgical History:  Procedure Laterality Date  . ABDOMINAL HYSTERECTOMY    . BUNIONECTOMY Left 2011  . HEEL SPUR SURGERY Right 2012  . KNEE ARTHROSCOPY WITH ANTERIOR CRUCIATE LIGAMENT (ACL) REPAIR WITH HAMSTRING GRAFT Right 01/31/2014   Procedure: RIGHT KNEE ARTHROSCOPY WITH ALLOGRAFT (ACL) ANTERIOR CRUCIATE LIGAMENT RECONSTRUCTON PARTIAL MENISCECTOMY VERSES REPAIR ;  Surgeon: Eugenia Mcalpine, MD;  Location:  [facility];  Service: Orthopedics;  Laterality: Right;  . TOTAL ABDOMINAL HYSTERECTOMY W/ BILATERAL SALPINGOOPHORECTOMY  04-20-2006    Objective: Physical Exam General: The patient is alert and oriented x3 in no acute distress.  Dermatology: Skin is warm, dry and supple bilateral lower extremities. Nails 1-10 are normal. There is no erythema, edema, no eccymosis, no open lesions present. Integument is otherwise unremarkable.  Old scar to right posterior heel with central painful raised area no redness no warmth no active drainage no other acute signs of infection.  Vascular: Dorsalis Pedis pulse and Posterior Tibial pulse are 2/4 bilateral. Capillary fill time is immediate to all digits.  Neurological: Grossly intact to light touch with an achilles reflex of +2/5 and a  negative Tinel's sign bilateral.  Musculoskeletal: Tenderness to palpation at the medial calcaneal tubercale and through the insertion of the plantar fascia on the right..No pain with compression of calcaneus bilateral however there is pain at scar on the posterior heel at achilles.  There is decreased Ankle joint range of motion bilateral. All other joints range of motion within normal limits bilateral. Strength 5/5 in all groups bilateral.   Assessment and Plan: Problem List Items Addressed This Visit    None    Visit Diagnoses    Scar painful    -  Primary   Achilles tendinitis, right leg       Plantar fasciitis       Heel spur, right       Right foot pain           -Complete examination performed.  -MRI results reviewed -Re-Discussed with patient in detail the condition of plantar fasciitis and residual tendinitis pain at scar at right heel from previous surgery, how this occurs and general treatment options. Explained both conservative and surgical treatments.  -Patient opt for surgical management. Consent obtained for excision of scar and planar fascial release on right. Pre and Post op course explained. Risks, benefits, alternatives explained. No guarantees given or implied. Surgical booking slip submitted and provided patient with Surgical packet and info for cone day -H&P form given -To dispense cam boot and crutches at surgical center -Patient to return to office after surgery or sooner if problems or questions arise.  Landis Martins, DPM

## 2019-12-13 ENCOUNTER — Other Ambulatory Visit: Payer: Self-pay

## 2019-12-13 ENCOUNTER — Encounter (HOSPITAL_BASED_OUTPATIENT_CLINIC_OR_DEPARTMENT_OTHER): Payer: Self-pay | Admitting: Sports Medicine

## 2019-12-16 ENCOUNTER — Other Ambulatory Visit: Payer: Self-pay

## 2019-12-16 ENCOUNTER — Encounter: Payer: Self-pay | Admitting: Internal Medicine

## 2019-12-16 ENCOUNTER — Ambulatory Visit: Payer: Self-pay | Attending: Internal Medicine | Admitting: Internal Medicine

## 2019-12-16 VITALS — BP 150/100 | HR 83 | Temp 97.7°F | Resp 16 | Wt 192.4 lb

## 2019-12-16 DIAGNOSIS — R7303 Prediabetes: Secondary | ICD-10-CM | POA: Insufficient documentation

## 2019-12-16 DIAGNOSIS — M62838 Other muscle spasm: Secondary | ICD-10-CM | POA: Insufficient documentation

## 2019-12-16 DIAGNOSIS — M25561 Pain in right knee: Secondary | ICD-10-CM | POA: Insufficient documentation

## 2019-12-16 DIAGNOSIS — R0683 Snoring: Secondary | ICD-10-CM | POA: Insufficient documentation

## 2019-12-16 DIAGNOSIS — Z7901 Long term (current) use of anticoagulants: Secondary | ICD-10-CM | POA: Insufficient documentation

## 2019-12-16 DIAGNOSIS — J302 Other seasonal allergic rhinitis: Secondary | ICD-10-CM | POA: Insufficient documentation

## 2019-12-16 DIAGNOSIS — K219 Gastro-esophageal reflux disease without esophagitis: Secondary | ICD-10-CM | POA: Insufficient documentation

## 2019-12-16 DIAGNOSIS — Z1231 Encounter for screening mammogram for malignant neoplasm of breast: Secondary | ICD-10-CM

## 2019-12-16 DIAGNOSIS — I10 Essential (primary) hypertension: Secondary | ICD-10-CM | POA: Insufficient documentation

## 2019-12-16 DIAGNOSIS — R0600 Dyspnea, unspecified: Secondary | ICD-10-CM | POA: Insufficient documentation

## 2019-12-16 DIAGNOSIS — R0609 Other forms of dyspnea: Secondary | ICD-10-CM | POA: Insufficient documentation

## 2019-12-16 DIAGNOSIS — R053 Chronic cough: Secondary | ICD-10-CM

## 2019-12-16 DIAGNOSIS — M545 Low back pain: Secondary | ICD-10-CM | POA: Insufficient documentation

## 2019-12-16 DIAGNOSIS — Z79899 Other long term (current) drug therapy: Secondary | ICD-10-CM | POA: Insufficient documentation

## 2019-12-16 DIAGNOSIS — F319 Bipolar disorder, unspecified: Secondary | ICD-10-CM | POA: Insufficient documentation

## 2019-12-16 DIAGNOSIS — R05 Cough: Secondary | ICD-10-CM | POA: Insufficient documentation

## 2019-12-16 DIAGNOSIS — Z6833 Body mass index (BMI) 33.0-33.9, adult: Secondary | ICD-10-CM | POA: Insufficient documentation

## 2019-12-16 DIAGNOSIS — F17210 Nicotine dependence, cigarettes, uncomplicated: Secondary | ICD-10-CM | POA: Insufficient documentation

## 2019-12-16 DIAGNOSIS — Z01818 Encounter for other preprocedural examination: Secondary | ICD-10-CM | POA: Insufficient documentation

## 2019-12-16 DIAGNOSIS — E669 Obesity, unspecified: Secondary | ICD-10-CM | POA: Insufficient documentation

## 2019-12-16 DIAGNOSIS — G5601 Carpal tunnel syndrome, right upper limb: Secondary | ICD-10-CM | POA: Insufficient documentation

## 2019-12-16 DIAGNOSIS — R062 Wheezing: Secondary | ICD-10-CM | POA: Insufficient documentation

## 2019-12-16 DIAGNOSIS — R0602 Shortness of breath: Secondary | ICD-10-CM | POA: Insufficient documentation

## 2019-12-16 DIAGNOSIS — G8929 Other chronic pain: Secondary | ICD-10-CM | POA: Insufficient documentation

## 2019-12-16 LAB — POCT GLYCOSYLATED HEMOGLOBIN (HGB A1C): HbA1c, POC (prediabetic range): 5.7 % (ref 5.7–6.4)

## 2019-12-16 LAB — GLUCOSE, POCT (MANUAL RESULT ENTRY): POC Glucose: 98 mg/dl (ref 70–99)

## 2019-12-16 MED ORDER — ALBUTEROL SULFATE HFA 108 (90 BASE) MCG/ACT IN AERS: 2.0000 | INHALATION_SPRAY | Freq: Four times a day (QID) | RESPIRATORY_TRACT | 2 refills | Status: DC | PRN

## 2019-12-16 MED ORDER — DULERA 200-5 MCG/ACT IN AERO: 2.0000 | INHALATION_SPRAY | Freq: Two times a day (BID) | RESPIRATORY_TRACT | 3 refills | Status: DC

## 2019-12-16 MED ORDER — LOSARTAN POTASSIUM 50 MG PO TABS: 25.0000 mg | ORAL_TABLET | Freq: Every day | ORAL | 3 refills | Status: DC

## 2019-12-16 MED ORDER — FLUTICASONE PROPIONATE 50 MCG/ACT NA SUSP: 1.0000 | Freq: Every day | NASAL | 2 refills | Status: DC

## 2019-12-16 NOTE — Progress Notes (Signed)
Patient ID: [name] [name], female    DOB: [birth year]  MRN: [id number]  CC: surgery clearance and Cough   Subjective: [name] [name] is a 54 y.o. female who presents for preoperative evaluation Her concerns today include:  Patient with history of HTN, tobdep, bipolar disorder, HL, chronic LBPwhich she attributes to arthritis, ETOHabuse in remission, ACD, marijuana in remission, GERD  Pt for pre-op. She is scheduled to have surgery done on the right foot that includes excision of painful scar at Achilles and plantar fascial release to be done under general anesthesia with local popliteal block. The surgery to be done by Dr. Cannon Kettle. No problems waking up from anesthesia in the past. No CP with ambulation + SOB on exertion.  After climbing a flight of stairs she has to stop and rest. Endorses wheezing at nights and wakes up at night with wheezing and shortness of breath. She sleeps on 2 pillows. She also endorses chronic cough productive of [place] and sometimes yellow mucus x6 months. Has been using her grandmothers [facility] inhaler for over a year. She uses the [facility] 4 times a day. She finds it helpful.  She smokes 1 pk/wk.  Trying to quit. Smoke since age 76. She was more of a heavy smoker when she drank alcohol heavily.  Quit drinking last year.  -swelling in feet Q 3-4 days.  -endorses loud snorning.  Does not feel rested when she wakes in mornings. +morning HA.  No daytime sleepiness.   Problems with allergies for which she takes Claritin and sometimes Zyrtec.  Out of Flonase  HYPERTENSION Currently taking: see medication list.  Already took meds for today. On amlodipine and HCTZ. Med Adherence: [x]  Yes    []  No Medication side effects: []  Yes    [x]  No Adherence with salt restriction: [x]  Yes    []  No Home Monitoring?: [x]  Yes    []  No Monitoring Frequency: once a wk Home BP results range: stays high   Patient Active Problem List   Diagnosis Date Noted  . Prediabetes  01/04/2019  . Obesity (BMI 30-39.9) 01/04/2019  . Muscle spasm 09/28/2018  . Ganglion cyst 09/28/2018  . Carpal tunnel syndrome of right wrist 09/28/2018  . Chronic midline low back pain without sciatica 11/23/2017  . Essential hypertension 11/23/2017  . Chronic pain of right knee 11/23/2017  . 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 Marijuana abuse 11/23/2017  . Tobacco dependence 11/23/2017  . Alcohol abuse 07/30/2016  . S/P ACL reconstruction 01/31/2014  . DEPRESSION 11/17/2008  . ALLERGIC RHINITIS 11/17/2008  . GERD 11/17/2008     Current Outpatient Medications on File Prior to Visit  Medication Sig Dispense Refill  . acetaminophen (TYLENOL) 500 MG tablet Take 1 tablet (500 mg total) by mouth every 6 (six) hours as needed for up to 30 doses. 30 tablet 0  . amLODipine (NORVASC) 10 MG tablet Take 1 tablet (10 mg total) by mouth daily. 90 tablet 3  . Ascorbic Acid (VITAMIN C PO) Take 1 tablet by mouth daily.    . baclofen (LIORESAL) 10 MG tablet Take 0.5-1 tablets (5-10 mg total) by mouth at bedtime as needed for muscle spasms. 30 each 3  . benzonatate (TESSALON) 100 MG capsule Take 2 capsules (200 mg total) by mouth 3 (three) times daily as needed for cough. 60 capsule 0  . celecoxib (CELEBREX) 200 MG capsule Take 1 capsule (200 mg total) by mouth 2 (two) times daily as needed. 60 capsule 6  . cetirizine (  ZYRTEC) 10 MG tablet Take 1 tablet (10 mg total) by mouth daily. 30 tablet 11  . Cyanocobalamin (VITAMIN B-12 PO) Take 1 tablet by mouth daily.    . Diclofenac Sodium 3 % GEL Apply 1 each topically 2 (two) times daily. 100 g 3  . gabapentin (NEURONTIN) 300 MG capsule Take 3 capsules (900 mg total) by mouth at bedtime. 90 capsule 3  . hydrochlorothiazide (HYDRODIURIL) 12.5 MG tablet Take 1 tablet (12.5 mg total) by mouth daily. 90 tablet 3  . hydrOXYzine (ATARAX/VISTARIL) 25 MG tablet Take 1 tablet (25 mg total) by mouth at bedtime as needed (sleep). 30 tablet 3  . ibuprofen  (ADVIL) 800 MG tablet Take 1 tablet (800 mg total) by mouth every 8 (eight) hours as needed for up to 30 doses for headache. 30 tablet 0  . methocarbamol (ROBAXIN) 500 MG tablet Take 2 tablets (1,000 mg total) by mouth 4 (four) times daily. 90 tablet 0  . Multiple Vitamin (MULTIVITAMIN) tablet Take 1 tablet by mouth daily.    . Omega-3 Fatty Acids (OMEGA-3 FISH OIL PO) Take 1 tablet by mouth daily.    Marland Kitchen omeprazole (PRILOSEC) 20 MG capsule Take 1 capsule (20 mg total) by mouth daily. 30 capsule 3  . ondansetron (ZOFRAN ODT) 4 MG disintegrating tablet Take 1 tablet (4 mg total) by mouth every 8 (eight) hours as needed for nausea or vomiting. 20 tablet 0  . polyethylene glycol powder (GLYCOLAX/MIRALAX) 17 GM/SCOOP powder Take 17 g by mouth daily as needed for mild constipation. 3350 g 1  . potassium chloride (KLOR-CON) 10 MEQ tablet Take 1 tablet (10 mEq total) by mouth daily. 30 tablet 2  . sertraline (ZOLOFT) 50 MG tablet Take 1 tablet (50 mg total) by mouth daily. 30 tablet 1  . SUMAtriptan (IMITREX) 100 MG tablet Take 1 tablet earliest onset of migraine.  May repeat in 2 hours if headache persists or recurs.  Maximum 2 tablets in 24 hours. 10 tablet 3  . topiramate (TOPAMAX) 50 MG tablet Take 1 tablet (50 mg total) by mouth at bedtime. 30 tablet 3  . triamcinolone cream (KENALOG) 0.1 % Apply 1 application topically 2 (two) times daily. 30 g 0   No current facility-administered medications on file prior to visit.    Allergies  Allergen Reactions  . Diphenhydramine Hcl Itching  . Metoprolol Palpitations    Social History   Socioeconomic History  . Marital status: Legally Separated    Spouse name: Not on file  . Number of children: Not on file  . Years of education: Not on file  . Highest education level: Not on file  Occupational History  . Not on file  Tobacco Use  . Smoking status: Current Every Day Smoker    Packs/day: 0.25    Years: 32.00    Pack years: 8.00    Types:  Cigarettes  . Smokeless tobacco: Never Used  Substance and Sexual Activity  . Alcohol use: Not Currently  . Drug use: Not Currently    Types: Marijuana    Comment: occasional  marijuana--  hx  "crack" use  last used 2004--  no treatment rehab (done on her own)  . Sexual activity: Not on file  Other Topics Concern  . Not on file  Social History Narrative  . Not on file   Social Determinants of Health   Financial Resource Strain:   . Difficulty of Paying Living Expenses:   Food Insecurity:   . Worried About Running  Out of Food in the Last Year:   . Ran Out of Food in the Last Year:   Transportation Needs:   . Lack of Transportation (Medical):   Marland Kitchen Lack of Transportation (Non-Medical):   Physical Activity:   . Days of Exercise per Week:   . Minutes of Exercise per Session:   Stress:   . Feeling of Stress :   Social Connections:   . Frequency of Communication with Friends and Family:   . Frequency of Social Gatherings with Friends and Family:   . Attends Religious Services:   . Active Member of Clubs or Organizations:   . Attends Banker Meetings:   Marland Kitchen Marital Status:   Intimate Partner Violence:   . Fear of Current or Ex-Partner:   . Emotionally Abused:   Marland Kitchen Physically Abused:   . Sexually Abused:     Family History  Problem Relation Age of Onset  . Hypertension Father     Past Surgical History:  Procedure Laterality Date  . ABDOMINAL HYSTERECTOMY    . BUNIONECTOMY Left 2011  . HEEL SPUR SURGERY Right 2012  . KNEE ARTHROSCOPY WITH ANTERIOR CRUCIATE LIGAMENT (ACL) REPAIR WITH HAMSTRING GRAFT Right 01/31/2014   Procedure: RIGHT KNEE ARTHROSCOPY WITH ALLOGRAFT (ACL) ANTERIOR CRUCIATE LIGAMENT RECONSTRUCTON PARTIAL MENISCECTOMY VERSES REPAIR ;  Surgeon: Eugenia Mcalpine, MD;  Location: [facility];  Service: Orthopedics;  Laterality: Right;  . TOTAL ABDOMINAL HYSTERECTOMY W/ BILATERAL SALPINGOOPHORECTOMY  04-20-2006    ROS: Review of  Systems Negative except as stated above  PHYSICAL EXAM: BP (!) 150/100   Pulse 83   Temp 97.7 F (36.5 C)   Resp 16   Wt 192 lb 6.4 oz (87.3 kg)   SpO2 95%   BMI 33.03 kg/m   Physical Exam  General appearance - alert, well appearing, middle-aged African-American female and in no distress Mental status - normal mood, behavior, speech, dress, motor activity, and thought processes Eyes - pupils equal and reactive, extraocular eye movements intact Nose -nasal turbinates are very enlarged right greater than left  mouth - mucous membranes moist, pharynx normal without lesions Neck - supple, no significant adenopathy Chest -breath sounds slightly decreased bilaterally but no wheezes or crackles heard.  Heart - normal rate, regular rhythm, normal S1, S2, no murmurs, rubs, clicks or gallops. Mild JVD Abdomen - soft, nontender, nondistended, no masses or organomegaly Extremities - peripheral pulses normal, no pedal edema, no clubbing or cyanosis  Lab Results  Component Value Date   HGBA1C 5.7 12/16/2019    CMP Latest Ref Rng & Units 07/14/2019 10/10/2018 10/06/2018  Glucose 70 - 99 mg/dL 95 95 409(B)  BUN 6 - 20 mg/dL 11 15 15   Creatinine 0.44 - 1.00 mg/dL 3.53 2.99  Sodium 135 - 145 mmol/L 141 138 137  Potassium 3.5 - 5.1 mmol/L 3.7 4.1 3.2(L)  Chloride 98 - 111 mmol/L 104 97 104  CO2 22 - 32 mmol/L 25 27 27   Calcium 8.9 - 10.3 mg/dL 9.5 2.42 9.0  Total Protein 6.5 - 8.1 g/dL - - 7.4  Total Bilirubin 0.3 - 1.2 mg/dL - - 0.5  Alkaline Phos 38 - 126 U/L - - 56  AST 15 - 41 U/L - - 19  ALT 0 - 44 U/L - - 18   Lipid Panel     Component Value Date/Time   CHOL 189 09/28/2018 1442   TRIG 153 (H) 09/28/2018 1442   HDL 49 09/28/2018 1442   CHOLHDL 3.9  09/28/2018 1442   CHOLHDL 4.5 Ratio 04/23/2007 2021   VLDL 39 04/23/2007 2021   LDLCALC 109 (H) 09/28/2018 1442    CBC    Component Value Date/Time   WBC 9.0 07/14/2019 1051   RBC 4.22 07/14/2019 1051   HGB 12.6 07/14/2019  1051   HGB 11.1 09/28/2018 1442   HCT 39.1 07/14/2019 1051   HCT 31.9 (L) 09/28/2018 1442   PLT 357 07/14/2019 1051   PLT 370 09/28/2018 1442   MCV 92.7 07/14/2019 1051   MCV 90 09/28/2018 1442   MCH 29.9 07/14/2019 1051   MCHC 32.2 07/14/2019 1051   RDW 13.1 07/14/2019 1051   RDW 11.8 09/28/2018 1442   LYMPHSABS 2.4 07/14/2019 1051   MONOABS 0.7 07/14/2019 1051   EOSABS 0.0 07/14/2019 1051   BASOSABS 0.0 07/14/2019 1051    ASSESSMENT AND PLAN: 1. Pre-op evaluation Patient with uncontrolled blood pressure and dyspnea on exertion with some respiratory symptoms to suggest underlying uncontrolled chronic respiratory disease. We'll try to get this better controlled first before approving her for surgery even though the surgery is low risk  2. Essential hypertension Not at goal. Add Cozaar. - losartan (COZAAR) 50 MG tablet; Take 0.5 tablets (25 mg total) by mouth daily.  Dispense: 30 tablet; Refill: 3 - CBC - Comprehensive metabolic panel - Lipid panel  3. DOE (dyspnea on exertion) 4. Chronic coughing -Symptoms suggest possible asthma or COPD. Also need to rule out cardiac cause. We will get a chest x-ray and pulmonary function test. Have her see Dr. Delford FieldWright within the next 1 to 2 weeks. Strongly advised smoking cessation. Prescribed albuterol inhaler. Try increase dose of Dulera which she has been taking without prescription Patient declined EKG today as she was in a rush to catch a ride - DG Chest 2 View; Future - Pro b natriuretic peptide -PFT - albuterol (VENTOLIN HFA) 108 (90 Base) MCG/ACT inhaler; Inhale 2 puffs into the lungs every 6 (six) hours as needed for wheezing or shortness of breath.  Dispense: 18 g; Refill: 2 - mometasone-formoterol (DULERA) 200-5 MCG/ACT AERO; Inhale 2 puffs into the lungs 2 (two) times daily.  Dispense: 13 g; Refill: 3 - DG Chest 2 View; Future  5. Loud snoring History suggest possible sleep apnea. - PSG Sleep Study; Future  6.  Prediabetes Still in range for prediabetes. Healthy eating habits recommended - POCT glucose (manual entry) - POCT glycosylated hemoglobin (Hb A1C)  7. Encounter for screening mammogram for malignant neoplasm of breast - MM Digital Screening; Future  8. Seasonal allergies Continue Zyrtec. Add Flonase. - fluticasone (FLONASE) 50 MCG/ACT nasal spray; Place 1 spray into both nostrils daily.  Dispense: 16 g; Refill: 2    Patient was given the opportunity to ask questions.  Patient verbalized understanding of the plan and was able to repeat key elements of the plan.   Orders Placed This Encounter  Procedures  . MM Digital Screening  . DG Chest 2 View  . CBC  . Comprehensive metabolic panel  . Lipid panel  . Pro b natriuretic peptide  . POCT glucose (manual entry)  . POCT glycosylated hemoglobin (Hb A1C)  . Pulmonary function test  . PSG Sleep Study     Requested Prescriptions   Signed Prescriptions Disp Refills  . losartan (COZAAR) 50 MG tablet 30 tablet 3    Sig: Take 0.5 tablets (25 mg total) by mouth daily.  . fluticasone (FLONASE) 50 MCG/ACT nasal spray 16 g 2    Sig:  Place 1 spray into both nostrils daily.  Marland Kitchen albuterol (VENTOLIN HFA) 108 (90 Base) MCG/ACT inhaler 18 g 2    Sig: Inhale 2 puffs into the lungs every 6 (six) hours as needed for wheezing or shortness of breath.  . mometasone-formoterol (DULERA) 200-5 MCG/ACT AERO 13 g 3    Sig: Inhale 2 puffs into the lungs 2 (two) times daily.    Return in about 4 weeks (around 01/13/2020) for Give appt with Dr. Delford Field in 1-2 wks for chronic cough/SOB.  Jonah Blue, MD, FACP

## 2019-12-16 NOTE — Patient Instructions (Signed)
Please give patient an appointment with Dr. Delford Field in 1 to 2 weeks for evaluation of chronic cough with shortness of breath.  I have increased the strength of the Dulera to 200.  Used twice a day. I have added an inhaler called albuterol inhaler for you to use every 4 hours as needed.  Please work on trying to quit smoking.  Remember to go to the radiology department to have your chest x-ray done.  Your blood pressure is not controlled.  We have added a medication called Cozaar.

## 2019-12-16 NOTE — Progress Notes (Signed)
Pt states she was told that her bp medicine is causing her to have a cough

## 2019-12-17 ENCOUNTER — Encounter (HOSPITAL_COMMUNITY): Payer: Self-pay | Admitting: Anesthesiology

## 2019-12-17 ENCOUNTER — Other Ambulatory Visit: Payer: Self-pay | Admitting: Internal Medicine

## 2019-12-17 LAB — COMPREHENSIVE METABOLIC PANEL
ALT: 17 IU/L (ref 0–32)
AST: 23 IU/L (ref 0–40)
Albumin/Globulin Ratio: 1.3 (ref 1.2–2.2)
Albumin: 4.3 g/dL (ref 3.8–4.9)
Alkaline Phosphatase: 73 IU/L (ref 39–117)
BUN/Creatinine Ratio: 14 (ref 9–23)
BUN: 12 mg/dL (ref 6–24)
Bilirubin Total: 0.2 mg/dL (ref 0.0–1.2)
CO2: 24 mmol/L (ref 20–29)
Calcium: 9.5 mg/dL (ref 8.7–10.2)
Chloride: 105 mmol/L (ref 96–106)
Creatinine, Ser: 0.84 mg/dL (ref 0.57–1.00)
GFR calc Af Amer: 90 mL/min/{1.73_m2} (ref >59)
GFR calc non Af Amer: 78 mL/min/{1.73_m2} (ref >59)
Globulin, Total: 3.3 g/dL (ref 1.5–4.5)
Glucose: 78 mg/dL (ref 65–99)
Potassium: 4.7 mmol/L (ref 3.5–5.2)
Sodium: 142 mmol/L (ref 134–144)
Total Protein: 7.6 g/dL (ref 6.0–8.5)

## 2019-12-17 LAB — CBC
Hematocrit: 37.9 % (ref 34.0–46.6)
Hemoglobin: 13 g/dL (ref 11.1–15.9)
MCH: 30.7 pg (ref 26.6–33.0)
MCHC: 34.3 g/dL (ref 31.5–35.7)
MCV: 90 fL (ref 79–97)
Platelets: 363 10*3/uL (ref 150–450)
RBC: 4.23 x10E6/uL (ref 3.77–5.28)
RDW: 12.9 % (ref 11.7–15.4)
WBC: 6.1 10*3/uL (ref 3.4–10.8)

## 2019-12-17 LAB — LIPID PANEL
Chol/HDL Ratio: 5.3 ratio — ABNORMAL HIGH (ref 0.0–4.4)
Cholesterol, Total: 232 mg/dL — ABNORMAL HIGH (ref 100–199)
HDL: 44 mg/dL (ref >39)
LDL Chol Calc (NIH): 152 mg/dL — ABNORMAL HIGH (ref 0–99)
Triglycerides: 195 mg/dL — ABNORMAL HIGH (ref 0–149)
VLDL Cholesterol Cal: 36 mg/dL (ref 5–40)

## 2019-12-17 LAB — PRO B NATRIURETIC PEPTIDE: NT-Pro BNP: 37 pg/mL (ref 0–287)

## 2019-12-17 NOTE — Progress Notes (Signed)
Let patient know that her blood count is normal meaning no anemia.  Kidney and liver function tests are normal.  She has elevated cholesterol of 152 with goal being less than 100.  This increases her risk for heart attack and strokes.  I recommend starting a medication called atorvastatin to help lower the cholesterol.  I will send that prescription to our pharmacy.

## 2019-12-18 ENCOUNTER — Telehealth: Payer: Self-pay

## 2019-12-18 ENCOUNTER — Other Ambulatory Visit: Payer: Self-pay | Admitting: Internal Medicine

## 2019-12-18 MED ORDER — ATORVASTATIN CALCIUM 10 MG PO TABS: 10.0000 mg | ORAL_TABLET | Freq: Every day | ORAL | 3 refills | Status: DC

## 2019-12-18 NOTE — Telephone Encounter (Signed)
Contacted pt to go over lab results pt is aware and doesn't have any questions or concerns 

## 2019-12-19 ENCOUNTER — Ambulatory Visit (HOSPITAL_COMMUNITY)
Admission: RE | Admit: 2019-12-19 | Discharge: 2019-12-19 | Disposition: A | Discharge status: Home / Self Care | Payer: Medicaid Other | Source: Ambulatory Visit | Attending: Internal Medicine | Admitting: Internal Medicine

## 2019-12-19 ENCOUNTER — Other Ambulatory Visit: Payer: Self-pay

## 2019-12-19 ENCOUNTER — Other Ambulatory Visit (HOSPITAL_COMMUNITY)
Admission: RE | Admit: 2019-12-19 | Discharge: 2019-12-19 | Disposition: A | Discharge status: Home / Self Care | Payer: Medicaid Other | Source: Ambulatory Visit | Attending: Sports Medicine | Admitting: Sports Medicine

## 2019-12-19 ENCOUNTER — Encounter (HOSPITAL_BASED_OUTPATIENT_CLINIC_OR_DEPARTMENT_OTHER)
Admission: RE | Admit: 2019-12-19 | Discharge: 2019-12-19 | Disposition: A | Discharge status: Home / Self Care | Payer: Medicaid Other | Source: Ambulatory Visit | Attending: Sports Medicine | Admitting: Sports Medicine

## 2019-12-19 DIAGNOSIS — R0600 Dyspnea, unspecified: Secondary | ICD-10-CM

## 2019-12-19 DIAGNOSIS — Z01812 Encounter for preprocedural laboratory examination: Secondary | ICD-10-CM | POA: Insufficient documentation

## 2019-12-19 DIAGNOSIS — R05 Cough: Secondary | ICD-10-CM | POA: Insufficient documentation

## 2019-12-19 DIAGNOSIS — R053 Chronic cough: Secondary | ICD-10-CM

## 2019-12-19 DIAGNOSIS — Z01818 Encounter for other preprocedural examination: Secondary | ICD-10-CM | POA: Insufficient documentation

## 2019-12-19 DIAGNOSIS — Z20822 Contact with and (suspected) exposure to covid-19: Secondary | ICD-10-CM | POA: Insufficient documentation

## 2019-12-19 DIAGNOSIS — R0609 Other forms of dyspnea: Secondary | ICD-10-CM | POA: Insufficient documentation

## 2019-12-19 LAB — BASIC METABOLIC PANEL
Anion gap: 10 (ref 5–15)
BUN: 11 mg/dL (ref 6–20)
CO2: 25 mmol/L (ref 22–32)
Calcium: 9.4 mg/dL (ref 8.9–10.3)
Chloride: 102 mmol/L (ref 98–111)
Creatinine, Ser: 0.89 mg/dL (ref 0.44–1.00)
GFR calc Af Amer: >60 mL/min (ref >60)
GFR calc non Af Amer: >60 mL/min (ref >60)
Glucose, Bld: 95 mg/dL (ref 70–99)
Potassium: 4.5 mmol/L (ref 3.5–5.1)
Sodium: 137 mmol/L (ref 135–145)

## 2019-12-19 LAB — SARS CORONAVIRUS 2 (TAT 6-24 HRS): SARS Coronavirus 2: NEGATIVE

## 2019-12-19 NOTE — Progress Notes (Signed)
Surgical soap given with instructions, pt verbalized understanding.  

## 2019-12-20 ENCOUNTER — Telehealth: Payer: Self-pay

## 2019-12-20 NOTE — Telephone Encounter (Signed)
Spoke to Dr. Henriette Combs office and they stated the patient is not clear for surgery on 12/23/19 due to her blood pressure. I spoke to the patient so she is aware that her surgery has been cancelled. I told her that once Dr. Laural Benes has cleared her to call me and we will get her back on the schedule. Notified Dr. Marylene Land of the cancellation.

## 2019-12-23 ENCOUNTER — Ambulatory Visit (HOSPITAL_BASED_OUTPATIENT_CLINIC_OR_DEPARTMENT_OTHER): Admission: RE | Admit: 2019-12-23 | Payer: Medicaid Other | Source: Home / Self Care | Admitting: Sports Medicine

## 2019-12-23 SURGERY — FASCIOTOMY, PLANTAR, ENDOSCOPIC
Anesthesia: General | Laterality: Right

## 2019-12-27 ENCOUNTER — Other Ambulatory Visit: Payer: Self-pay | Admitting: Internal Medicine

## 2019-12-27 DIAGNOSIS — R0609 Other forms of dyspnea: Secondary | ICD-10-CM

## 2019-12-27 DIAGNOSIS — R0600 Dyspnea, unspecified: Secondary | ICD-10-CM

## 2019-12-30 ENCOUNTER — Other Ambulatory Visit: Payer: Self-pay

## 2019-12-30 ENCOUNTER — Encounter: Payer: Self-pay | Admitting: Critical Care Medicine

## 2019-12-30 ENCOUNTER — Ambulatory Visit: Payer: Self-pay | Attending: Critical Care Medicine | Admitting: Critical Care Medicine

## 2019-12-30 ENCOUNTER — Other Ambulatory Visit: Payer: Self-pay | Admitting: Pharmacist

## 2019-12-30 VITALS — BP 133/96 | HR 83 | Temp 97.0°F | Wt 195.0 lb

## 2019-12-30 DIAGNOSIS — K219 Gastro-esophageal reflux disease without esophagitis: Secondary | ICD-10-CM

## 2019-12-30 DIAGNOSIS — J302 Other seasonal allergic rhinitis: Secondary | ICD-10-CM | POA: Insufficient documentation

## 2019-12-30 DIAGNOSIS — T7840XA Allergy, unspecified, initial encounter: Secondary | ICD-10-CM

## 2019-12-30 DIAGNOSIS — R053 Chronic cough: Secondary | ICD-10-CM | POA: Insufficient documentation

## 2019-12-30 DIAGNOSIS — R05 Cough: Secondary | ICD-10-CM

## 2019-12-30 DIAGNOSIS — J4541 Moderate persistent asthma with (acute) exacerbation: Secondary | ICD-10-CM | POA: Insufficient documentation

## 2019-12-30 DIAGNOSIS — F17200 Nicotine dependence, unspecified, uncomplicated: Secondary | ICD-10-CM

## 2019-12-30 MED ORDER — AEROCHAMBER MV MISC: 0 refills | Status: AC

## 2019-12-30 MED ORDER — PROMETHAZINE-DM 6.25-15 MG/5ML PO SYRP
5.0000 mL | ORAL_SOLUTION | Freq: Four times a day (QID) | ORAL | 0 refills | Status: DC | PRN
Start: 2019-12-30 — End: 2020-05-14

## 2019-12-30 MED ORDER — PANTOPRAZOLE SODIUM 40 MG PO TBEC: 40.0000 mg | DELAYED_RELEASE_TABLET | Freq: Every day | ORAL | 3 refills | Status: DC

## 2019-12-30 MED ORDER — NICOTINE POLACRILEX 4 MG MT LOZG: LOZENGE | OROMUCOSAL | 4 refills | Status: DC

## 2019-12-30 MED ORDER — DULERA 200-5 MCG/ACT IN AERO: 2.0000 | INHALATION_SPRAY | Freq: Two times a day (BID) | RESPIRATORY_TRACT | 3 refills | Status: DC

## 2019-12-30 MED ORDER — PREDNISONE 10 MG PO TABS
ORAL_TABLET | ORAL | 0 refills | Status: DC
Start: 2019-12-30 — End: 2020-01-20

## 2019-12-30 MED ORDER — CETIRIZINE HCL 10 MG PO TABS: 10.0000 mg | ORAL_TABLET | Freq: Every day | ORAL | 11 refills | Status: DC

## 2019-12-30 MED ORDER — FLUTICASONE PROPIONATE 50 MCG/ACT NA SUSP: 2.0000 | Freq: Every day | NASAL | 2 refills | Status: AC

## 2019-12-30 MED FILL — SM NICOTINE 4 MG LOZENGE: 4 | 8 days supply | Qty: 24 | Fill #0

## 2019-12-30 MED FILL — ?CETIRIZINE HCL 10 MG TABLE: 10 | 30 days supply | Qty: 30 | Fill #0

## 2019-12-30 MED FILL — PROMETHAZINE W/DM SYRUP: 6.25-15 | 12 days supply | Qty: 240 | Fill #0

## 2019-12-30 MED FILL — ?PREDINSONE 10MG TABLETS: 10 | 5 days supply | Qty: 20 | Fill #0

## 2019-12-30 MED FILL — ?PANTOPRAZOLE SO DR 40MG TA: 40 | 30 days supply | Qty: 30 | Fill #0

## 2019-12-30 NOTE — Assessment & Plan Note (Signed)
Reflux disease likely playing a significant role  Reflux diet given to patient and also will increase proton pump inhibitor to 40 mg daily

## 2019-12-30 NOTE — Assessment & Plan Note (Signed)
  .   Current smoking consumption amount: 1 pack a week  . Dicsussion on advise to quit smoking and smoking impacts: Adversely health effects on lungs and cardiovascular system  . Patient's willingness to quit: The patient is motivated to quit  . Methods to quit smoking discussed: We discussed behavior modification techniques and nicotine replacement therapy  . Medication management of smoking session drugs discussed: I recommended nicotine replacement lozenges at 4 mg 3 times daily and the patient will begin this  . Resources provided:  AVS   . Setting quit date: This has not been set yet  . Follow-up arranged patient to follow-up in 6 weeks   Time spent counseling the patient:

## 2019-12-30 NOTE — Patient Instructions (Signed)
Stay on dulera twice a day, use spacer  Start prednisone 10mg  :  Take 4 tablets daily for 5 days then stop  Increase flonase to two puff ea nostril daily  Stay on zyrtec  Stop omega fish oil  Start protonix 40mg  daily  Follow reflux diet below  Keep appt for sleep study and lung function  Lab today allergy panel  Stop smoking see below, use nicotine lozenge   Tobacco Use Disorder Tobacco use disorder (TUD) occurs when a person craves, seeks, and uses tobacco, regardless of the consequences. This disorder can cause problems with mental and physical health. It can affect your ability to have healthy relationships, and it can keep you from meeting your responsibilities at work, home, or school. Tobacco may be:  Smoked as a cigarette or cigar.  Inhaled using e-cigarettes.  Smoked in a pipe or hookah.  Chewed as smokeless tobacco.  Inhaled into the nostrils as snuff. Tobacco products contain a dangerous chemical called nicotine, which is very addictive. Nicotine triggers hormones that make the body feel stimulated and works on areas of the brain that make you feel good. These effects can make it hard for people to quit nicotine. Tobacco contains many other unsafe chemicals that can damage almost every organ in the body. Smoking tobacco also puts others in danger due to fire risk and possible health problems caused by breathing in secondhand smoke. What are the signs or symptoms? Symptoms of TUD may include:  Being unable to slow down or stop your tobacco use.  Spending an abnormal amount of time getting or using tobacco.  Craving tobacco. Cravings may last for up to 6 months after quitting.  Tobacco use that: ? Interferes with your work, school, or home life. ? Interferes with your personal and social relationships. ? Makes you give up activities that you once enjoyed or found important.  Using tobacco even though you know that it is: ? Dangerous or bad for your health  or someone else's health. ? Causing problems in your life.  Needing more and more of the substance to get the same effect (developing tolerance).  Experiencing unpleasant symptoms if you do not use the substance (withdrawal). Withdrawal symptoms may include: ? Depressed, anxious, or irritable mood. ? Difficulty concentrating. ? Increased appetite. ? Restlessness or trouble sleeping.  Using the substance to avoid withdrawal. How is this diagnosed? This condition may be diagnosed based on:  Your current and past tobacco use. Your health care provider may ask questions about how your tobacco use affects your life.  A physical exam. You may be diagnosed with TUD if you have at least two symptoms within a 24-month period. How is this treated? This condition is treated by stopping tobacco use. Many people are unable to quit on their own and need help. Treatment may include:  Nicotine replacement therapy (NRT). NRT provides nicotine without the other harmful chemicals in tobacco. NRT gradually lowers the dosage of nicotine in the body and reduces withdrawal symptoms. NRT is available as: ? Over-the-counter gums, lozenges, and skin patches. ? Prescription mouth inhalers and nasal sprays.  Medicine that acts on the brain to reduce cravings and withdrawal symptoms.  A type of talk therapy that examines your triggers for tobacco use, how to avoid them, and how to cope with cravings (behavioral therapy).  Hypnosis. This may help with withdrawal symptoms.  Joining a support group for others coping with TUD. The best treatment for TUD is usually a combination of medicine, talk  therapy, and support groups. Recovery can be a long process. Many people start using tobacco again after stopping (relapse). If you relapse, it does not mean that treatment will not work. Follow these instructions at home:  Lifestyle  Do not use any products that contain nicotine or tobacco, such as cigarettes and  e-cigarettes.  Avoid things that trigger tobacco use as much as you can. Triggers include people and situations that usually cause you to use tobacco.  Avoid drinks that contain caffeine, including coffee. These may worsen some withdrawal symptoms.  Find ways to manage stress. Wanting to smoke may cause stress, and stress can make you want to smoke. Relaxation techniques such as deep breathing, meditation, and yoga may help.  Attend support groups as needed. These groups are an important part of long-term recovery for many people. General instructions  Take over-the-counter and prescription medicines only as told by your health care provider.  Check with your health care provider before taking any new prescription or over-the-counter medicines.  Decide on a friend, family member, or smoking quit-line (such as 1-800-QUIT-NOW in the [place] that you can call or text when you feel the urge to smoke or when you need help coping with cravings.  Keep all follow-up visits as told by your health care provider and therapist. This is important. Contact a health care provider if:  You are not able to take your medicines as prescribed.  Your symptoms get worse, even with treatment. Summary  Tobacco use disorder (TUD) occurs when a person craves, seeks, and uses tobacco regardless of the consequences.  This condition may be diagnosed based on your current and past tobacco use and a physical exam.  Many people are unable to quit on their own and need help. Recovery can be a long process.  The most effective treatment for TUD is usually a combination of medicine, talk therapy, and support groups. This information is not intended to replace advice given to you by your health care provider. Make sure you discuss any questions you have with your health care provider. Document Revised: 07/12/2017 Document Reviewed: 07/12/2017 Elsevier Patient Education  2020 [employer].  Food Choices for  Gastroesophageal Reflux Disease, Adult When you have gastroesophageal reflux disease (GERD), the foods you eat and your eating habits are very important. Choosing the right foods can help ease your discomfort. Think about working with a nutrition specialist (dietitian) to help you make good choices. What are tips for following this plan?  Meals  Choose healthy foods that are low in fat, such as fruits, vegetables, whole grains, low-fat dairy products, and lean meat, fish, and poultry.  Eat small meals often instead of 3 large meals a day. Eat your meals slowly, and in a place where you are relaxed. Avoid bending over or lying down until 2-3 hours after eating.  Avoid eating meals 2-3 hours before bed.  Avoid drinking a lot of liquid with meals.  Cook foods using methods other than frying. Bake, grill, or broil food instead.  Avoid or limit: ? Chocolate. ? Peppermint or spearmint. ? Alcohol. ? Pepper. ? Black and decaffeinated coffee. ? Black and decaffeinated tea. ? Bubbly (carbonated) soft drinks. ? Caffeinated energy drinks and soft drinks.  Limit high-fat foods such as: ? Fatty meat or fried foods. ? Whole milk, cream, butter, or ice cream. ? Nuts and nut butters. ? Pastries, donuts, and sweets made with butter or shortening.  Avoid foods that cause symptoms. These foods may be different for  everyone. Common foods that cause symptoms include: ? Tomatoes. ? Oranges, lemons, and limes. ? Peppers. ? Spicy food. ? Onions and garlic. ? Vinegar. Lifestyle  Maintain a healthy weight. Ask your doctor what weight is healthy for you. If you need to lose weight, work with your doctor to do so safely.  Exercise for at least 30 minutes for 5 or more days each week, or as told by your doctor.  Wear loose-fitting clothes.  Do not smoke. If you need help quitting, ask your doctor.  Sleep with the head of your bed higher than your feet. Use a wedge under the mattress or blocks  under the bed frame to raise the head of the bed. Summary  When you have gastroesophageal reflux disease (GERD), food and lifestyle choices are very important in easing your symptoms.  Eat small meals often instead of 3 large meals a day. Eat your meals slowly, and in a place where you are relaxed.  Limit high-fat foods such as fatty meat or fried foods.  Avoid bending over or lying down until 2-3 hours after eating.  Avoid peppermint and spearmint, caffeine, alcohol, and chocolate. This information is not intended to replace advice given to you by your health care provider. Make sure you discuss any questions you have with your health care provider. Document Revised: 11/15/2018 Document Reviewed: 08/30/2016 Elsevier Patient Education  2020 [employer].

## 2019-12-30 NOTE — Progress Notes (Signed)
Subjective:    Patient ID: [name] [name], female    DOB: Dec 07, 1965, 54 y.o.   MRN: [id number]  54 y.o.F here for cough/dyspnea eval from PCP [facility]  This patient is seen for evaluation of cough and dyspnea.  Patient had Covid infection in October and since that time has had persistent cough.  She continues smoke 1 pack a week.  She has been smoking more than this since the age of 52.  The patient's not using nicotine replacement at this time.  His shortness of breath with exertion particular worse after climbing a flight of stairs.  She not will wheeze at night and wake up coughing and wheezing and short of breath.  She has a sleep on several pillows.  The cough is occasionally productive of yellow mucus but mostly Mena and clear.  She also has yellow drainage from the nose.  She now is on Dulera 200 mcg strength 2 inhalations twice daily.  There is occasional edema in the lower extremities.  She states she is in an old apartment in [place] area and there is dust and she is worried about mold as well in the apartment and she will send me photographs of this as well.  She does take fish oil daily.  The patient is on Zyrtec and Flonase for rhinitis.  She has not had allergy testing before in the past.  Shortness of Breath This is a new (4-5) problem. The current episode started more than 1 month ago. The problem occurs constantly (sob at night and day). Associated symptoms include chest pain, headaches, leg pain, orthopnea, PND, rhinorrhea, a sore throat, sputum production, swollen glands, vomiting and wheezing. Pertinent negatives include no claudication, coryza, ear pain, fever, hemoptysis, leg swelling, neck pain, rash or syncope. The symptoms are aggravated by weather changes, pollens, odors, fumes, any activity, exercise, lying flat, eating and emotional upset. Associated symptoms comments: coughj is dry  Mucus is yellow /Stinger Nasal congestion Some facial pressure  gerd. Risk  factors include smoking.   Past Medical History:  Diagnosis Date  . Asthma   . Bipolar 1 disorder (HCC)   . Chronic lumbar pain   . DDD (degenerative disc disease)   . Depression   . GERD (gastroesophageal reflux disease)   . History of gastric ulcer   . Hyperlipidemia   . Hypertension   . Migraine   . Osteoarthritis   . Right ACL tear   . Right knee meniscal tear   . Sciatica   . Wears glasses      Family History  Problem Relation Age of Onset  . Hypertension Father      Social History   Socioeconomic History  . Marital status: Legally Separated    Spouse name: Not on file  . Number of children: Not on file  . Years of education: Not on file  . Highest education level: Not on file  Occupational History  . Not on file  Tobacco Use  . Smoking status: Current Every Day Smoker    Packs/day: 0.25    Years: 32.00    Pack years: 8.00    Types: Cigarettes  . Smokeless tobacco: Never Used  Substance and Sexual Activity  . Alcohol use: Not Currently  . Drug use: Not Currently    Types: Marijuana    Comment: occasional  marijuana--  hx  "crack" use  last used 2004--  no treatment rehab (done on her own)  . Sexual activity: Not on file  Other Topics Concern  . Not on file  Social History Narrative  . Not on file   Social Determinants of Health   Financial Resource Strain:   . Difficulty of Paying Living Expenses:   Food Insecurity:   . Worried About Programme researcher, broadcasting/film/video in the Last Year:   . Barista in the Last Year:   Transportation Needs:   . Freight forwarder (Medical):   Marland Kitchen Lack of Transportation (Non-Medical):   Physical Activity:   . Days of Exercise per Week:   . Minutes of Exercise per Session:   Stress:   . Feeling of Stress :   Social Connections:   . Frequency of Communication with Friends and Family:   . Frequency of Social Gatherings with Friends and Family:   . Attends Religious Services:   . Active Member of Clubs or  Organizations:   . Attends Banker Meetings:   Marland Kitchen Marital Status:   Intimate Partner Violence:   . Fear of Current or Ex-Partner:   . Emotionally Abused:   Marland Kitchen Physically Abused:   . Sexually Abused:      Allergies  Allergen Reactions  . Diphenhydramine Hcl Itching  . Metoprolol Palpitations     Outpatient Medications Prior to Visit  Medication Sig Dispense Refill  . albuterol (VENTOLIN HFA) 108 (90 Base) MCG/ACT inhaler Inhale 2 puffs into the lungs every 6 (six) hours as needed for wheezing or shortness of breath. 18 g 2  . amLODipine (NORVASC) 10 MG tablet Take 1 tablet (10 mg total) by mouth daily. 90 tablet 3  . Ascorbic Acid (VITAMIN C PO) Take 1 tablet by mouth daily.    Marland Kitchen atorvastatin (LIPITOR) 10 MG tablet Take 1 tablet (10 mg total) by mouth daily. 90 tablet 3  . baclofen (LIORESAL) 10 MG tablet Take 0.5-1 tablets (5-10 mg total) by mouth at bedtime as needed for muscle spasms. 30 each 3  . celecoxib (CELEBREX) 200 MG capsule Take 1 capsule (200 mg total) by mouth 2 (two) times daily as needed. 60 capsule 6  . Cyanocobalamin (VITAMIN B-12 PO) Take 1 tablet by mouth daily.    . Diclofenac Sodium 3 % GEL Apply 1 each topically 2 (two) times daily. 100 g 3  . gabapentin (NEURONTIN) 300 MG capsule Take 3 capsules (900 mg total) by mouth at bedtime. 90 capsule 3  . hydrochlorothiazide (HYDRODIURIL) 12.5 MG tablet Take 1 tablet (12.5 mg total) by mouth daily. 90 tablet 3  . hydrOXYzine (ATARAX/VISTARIL) 25 MG tablet Take 1 tablet (25 mg total) by mouth at bedtime as needed (sleep). 30 tablet 3  . losartan (COZAAR) 50 MG tablet Take 0.5 tablets (25 mg total) by mouth daily. 30 tablet 3  . methocarbamol (ROBAXIN) 500 MG tablet Take 2 tablets (1,000 mg total) by mouth 4 (four) times daily. 90 tablet 0  . Multiple Vitamin (MULTIVITAMIN) tablet Take 1 tablet by mouth daily.    . ondansetron (ZOFRAN ODT) 4 MG disintegrating tablet Take 1 tablet (4 mg total) by mouth every 8  (eight) hours as needed for nausea or vomiting. 20 tablet 0  . polyethylene glycol powder (GLYCOLAX/MIRALAX) 17 GM/SCOOP powder Take 17 g by mouth daily as needed for mild constipation. 3350 g 1  . potassium chloride (KLOR-CON) 10 MEQ tablet Take 1 tablet (10 mEq total) by mouth daily. 30 tablet 2  . sertraline (ZOLOFT) 50 MG tablet Take 1 tablet (50 mg total) by mouth daily. 30 tablet  1  . SUMAtriptan (IMITREX) 100 MG tablet Take 1 tablet earliest onset of migraine.  May repeat in 2 hours if headache persists or recurs.  Maximum 2 tablets in 24 hours. 10 tablet 3  . topiramate (TOPAMAX) 50 MG tablet Take 1 tablet (50 mg total) by mouth at bedtime. 30 tablet 3  . triamcinolone cream (KENALOG) 0.1 % Apply 1 application topically 2 (two) times daily. 30 g 0  . benzonatate (TESSALON) 100 MG capsule Take 2 capsules (200 mg total) by mouth 3 (three) times daily as needed for cough. 60 capsule 0  . cetirizine (ZYRTEC) 10 MG tablet Take 1 tablet (10 mg total) by mouth daily. 30 tablet 11  . fluticasone (FLONASE) 50 MCG/ACT nasal spray Place 1 spray into both nostrils daily. 16 g 2  . mometasone-formoterol (DULERA) 200-5 MCG/ACT AERO Inhale 2 puffs into the lungs 2 (two) times daily. 13 g 3  . Omega-3 Fatty Acids (OMEGA-3 FISH OIL PO) Take 1 tablet by mouth daily.    Marland Kitchen omeprazole (PRILOSEC) 20 MG capsule Take 1 capsule (20 mg total) by mouth daily. 30 capsule 3  . acetaminophen (TYLENOL) 500 MG tablet Take 1 tablet (500 mg total) by mouth every 6 (six) hours as needed for up to 30 doses. 30 tablet 0  . ibuprofen (ADVIL) 800 MG tablet Take 1 tablet (800 mg total) by mouth every 8 (eight) hours as needed for up to 30 doses for headache. (Patient not taking: Reported on 12/30/2019) 30 tablet 0   No facility-administered medications prior to visit.      Review of Systems  Constitutional: Negative for fever.  HENT: Positive for rhinorrhea and sore throat. Negative for ear pain.   Respiratory: Positive for  sputum production, shortness of breath and wheezing. Negative for hemoptysis.   Cardiovascular: Positive for chest pain, orthopnea and PND. Negative for claudication, leg swelling and syncope.  Gastrointestinal: Positive for vomiting.  Musculoskeletal: Negative for neck pain.  Skin: Negative for rash.  Neurological: Positive for headaches.       Objective:   Physical Exam Vitals:   12/30/19 1057  BP: (!) 133/96  Pulse: 83  Temp: (!) 97 F (36.1 C)  SpO2: 99%  Weight: 195 lb (88.5 kg)    Gen: Pleasant, well-nourished, in no distress,  normal affect  ENT: No purulence but bilateral nasal turbinate edema is noted,  mouth clear,  oropharynx clear, 2+ postnasal drip  Neck: No JVD, no TMG, no carotid bruits  Lungs: No use of accessory muscles, no dullness to percussion, distant breath sounds with few expiratory wheezes Cardiovascular: RRR, heart sounds normal, no murmur or gallops, no peripheral edema  Abdomen: soft and NT, no HSM,  BS normal  Musculoskeletal: No deformities, no cyanosis or clubbing  Neuro: alert, non focal  Skin: Warm, no lesions or rashes  Labs in the [facility] health link system are reviewed chest x-ray was normal from May 13 of this year  Inhaler technique is reviewed and there is very poor timing with the inhaler technique    Assessment & Plan:  I personally reviewed all images and lab data in the [facility] system as well as any outside material available during this office visit and agree with the  radiology impressions.   Moderate persistent asthma with acute exacerbation Symptom complex most consistent with moderate persistent asthma with acute exacerbation  I am not completely clear she has actual COPD at this time but pulmonary functions will give [place] the information needed to  determine that  Reflux disease is likely playing a significant role Poor inhaler technique with poor timing  Plan  Will pulse prednisone 40 mg a day for 4 days  Increase proton  pump inhibitor to Protonix at 40 mg daily  I reinstructed as to proper HFA utilization and will provide for this patient an AeroChamber device   Will also check mold allergy profile and IgE level      Tobacco dependence    . Current smoking consumption amount: 1 pack a week  . Dicsussion on advise to quit smoking and smoking impacts: Adversely health effects on lungs and cardiovascular system  . Patient's willingness to quit: The patient is motivated to quit  . Methods to quit smoking discussed: We discussed behavior modification techniques and nicotine replacement therapy  . Medication management of smoking session drugs discussed: I recommended nicotine replacement lozenges at 4 mg 3 times daily and the patient will begin this  . Resources provided:  AVS   . Setting quit date: This has not been set yet  . Follow-up arranged patient to follow-up in 6 weeks   Time spent counseling the patient:     GERD Reflux disease likely playing a significant role  Reflux diet given to patient and also will increase proton pump inhibitor to 40 mg daily   Daiana was seen today for cough.  Diagnoses and all orders for this visit:  Moderate persistent asthma with acute exacerbation -     Allergen Profile, Mold -     IgE  Allergic reaction, initial encounter -     cetirizine (ZYRTEC) 10 MG tablet; Take 1 tablet (10 mg total) by mouth daily.  Chronic coughing -     mometasone-formoterol (DULERA) 200-5 MCG/ACT AERO; Inhale 2 puffs into the lungs 2 (two) times daily. -     Allergen Profile, Mold -     IgE  Seasonal allergies -     fluticasone (FLONASE) 50 MCG/ACT nasal spray; Place 2 sprays into both nostrils daily.  Tobacco dependence  Gastroesophageal reflux disease, unspecified whether esophagitis present  Other orders -     predniSONE (DELTASONE) 10 MG tablet; Take 4 tablets daily for 5 days then stop -     Spacer/Aero-Holding Chambers (AEROCHAMBER MV) inhaler; Use as  instructed -     pantoprazole (PROTONIX) 40 MG tablet; Take 1 tablet (40 mg total) by mouth daily. -     promethazine-dextromethorphan (PROMETHAZINE-DM) 6.25-15 MG/5ML syrup; Take 5 mLs by mouth 4 (four) times daily as needed for cough. -     Discontinue: nicotine polacrilex (NICORETTE MINI) 4 MG lozenge; Use three times a day

## 2019-12-30 NOTE — Progress Notes (Signed)
Productive cough and coughing spells.  Worse HS-   SOB

## 2019-12-30 NOTE — Assessment & Plan Note (Addendum)
Symptom complex most consistent with moderate persistent asthma with acute exacerbation  I am not completely clear she has actual COPD at this time but pulmonary functions will give [place] the information needed to determine that  Reflux disease is likely playing a significant role Poor inhaler technique with poor timing  Plan  Will pulse prednisone 40 mg a day for 4 days  Increase proton pump inhibitor to Protonix at 40 mg daily  I reinstructed as to proper HFA utilization and will provide for this patient an AeroChamber device   Will also check mold allergy profile and IgE level

## 2019-12-31 ENCOUNTER — Encounter: Payer: No Typology Code available for payment source | Admitting: Sports Medicine

## 2020-01-04 LAB — ALLERGEN PROFILE, MOLD
Alternaria Alternata IgE: <0.1 kU/L
Aspergillus Fumigatus IgE: <0.1 kU/L
Aureobasidi Pullulans IgE: <0.1 kU/L
Candida Albicans IgE: <0.1 kU/L
Cladosporium Herbarum IgE: <0.1 kU/L
M009-IgE Fusarium proliferatum: <0.1 kU/L
M014-IgE Epicoccum purpur: <0.1 kU/L
Mucor Racemosus IgE: <0.1 kU/L
Penicillium Chrysogen IgE: <0.1 kU/L
Phoma Betae IgE: <0.1 kU/L
Setomelanomma Rostrat: <0.1 kU/L
Stemphylium Herbarum IgE: <0.1 kU/L

## 2020-01-04 LAB — IGE: IgE (Immunoglobulin E), Serum: 29 IU/mL (ref 6–495)

## 2020-01-07 ENCOUNTER — Encounter: Payer: No Typology Code available for payment source | Admitting: Sports Medicine

## 2020-01-09 ENCOUNTER — Other Ambulatory Visit (HOSPITAL_COMMUNITY): Payer: Medicaid Other

## 2020-01-11 ENCOUNTER — Encounter (HOSPITAL_BASED_OUTPATIENT_CLINIC_OR_DEPARTMENT_OTHER): Payer: Medicaid Other | Admitting: Internal Medicine

## 2020-01-16 ENCOUNTER — Encounter (HOSPITAL_COMMUNITY): Payer: Medicaid Other

## 2020-01-20 ENCOUNTER — Other Ambulatory Visit: Payer: Self-pay

## 2020-01-20 ENCOUNTER — Other Ambulatory Visit (HOSPITAL_COMMUNITY)
Admission: RE | Admit: 2020-01-20 | Discharge: 2020-01-20 | Disposition: A | Discharge status: Home / Self Care | Payer: Medicaid Other | Source: Ambulatory Visit | Attending: Internal Medicine | Admitting: Internal Medicine

## 2020-01-20 ENCOUNTER — Ambulatory Visit: Payer: Self-pay | Attending: Internal Medicine | Admitting: Internal Medicine

## 2020-01-20 ENCOUNTER — Encounter: Payer: Self-pay | Admitting: Internal Medicine

## 2020-01-20 VITALS — BP 146/96 | HR 78 | Temp 98.2°F | Resp 16 | Wt 195.2 lb

## 2020-01-20 DIAGNOSIS — J4540 Moderate persistent asthma, uncomplicated: Secondary | ICD-10-CM | POA: Insufficient documentation

## 2020-01-20 DIAGNOSIS — R7303 Prediabetes: Secondary | ICD-10-CM | POA: Insufficient documentation

## 2020-01-20 DIAGNOSIS — Z20822 Contact with and (suspected) exposure to covid-19: Secondary | ICD-10-CM | POA: Diagnosis not present

## 2020-01-20 DIAGNOSIS — G5601 Carpal tunnel syndrome, right upper limb: Secondary | ICD-10-CM | POA: Insufficient documentation

## 2020-01-20 DIAGNOSIS — Z01812 Encounter for preprocedural laboratory examination: Secondary | ICD-10-CM | POA: Insufficient documentation

## 2020-01-20 DIAGNOSIS — Z90710 Acquired absence of both cervix and uterus: Secondary | ICD-10-CM | POA: Insufficient documentation

## 2020-01-20 DIAGNOSIS — M25561 Pain in right knee: Secondary | ICD-10-CM | POA: Insufficient documentation

## 2020-01-20 DIAGNOSIS — F17210 Nicotine dependence, cigarettes, uncomplicated: Secondary | ICD-10-CM | POA: Insufficient documentation

## 2020-01-20 DIAGNOSIS — R0683 Snoring: Secondary | ICD-10-CM | POA: Insufficient documentation

## 2020-01-20 DIAGNOSIS — E669 Obesity, unspecified: Secondary | ICD-10-CM | POA: Insufficient documentation

## 2020-01-20 DIAGNOSIS — Z90722 Acquired absence of ovaries, bilateral: Secondary | ICD-10-CM | POA: Insufficient documentation

## 2020-01-20 DIAGNOSIS — Z888 Allergy status to other drugs, medicaments and biological substances status: Secondary | ICD-10-CM | POA: Insufficient documentation

## 2020-01-20 DIAGNOSIS — Z8249 Family history of ischemic heart disease and other diseases of the circulatory system: Secondary | ICD-10-CM | POA: Insufficient documentation

## 2020-01-20 DIAGNOSIS — R05 Cough: Secondary | ICD-10-CM | POA: Insufficient documentation

## 2020-01-20 DIAGNOSIS — Z79899 Other long term (current) drug therapy: Secondary | ICD-10-CM | POA: Insufficient documentation

## 2020-01-20 DIAGNOSIS — Z791 Long term (current) use of non-steroidal anti-inflammatories (NSAID): Secondary | ICD-10-CM | POA: Insufficient documentation

## 2020-01-20 DIAGNOSIS — I10 Essential (primary) hypertension: Secondary | ICD-10-CM | POA: Insufficient documentation

## 2020-01-20 DIAGNOSIS — Z7951 Long term (current) use of inhaled steroids: Secondary | ICD-10-CM | POA: Insufficient documentation

## 2020-01-20 DIAGNOSIS — K219 Gastro-esophageal reflux disease without esophagitis: Secondary | ICD-10-CM | POA: Insufficient documentation

## 2020-01-20 DIAGNOSIS — Z6833 Body mass index (BMI) 33.0-33.9, adult: Secondary | ICD-10-CM | POA: Insufficient documentation

## 2020-01-20 DIAGNOSIS — F319 Bipolar disorder, unspecified: Secondary | ICD-10-CM | POA: Insufficient documentation

## 2020-01-20 DIAGNOSIS — G8929 Other chronic pain: Secondary | ICD-10-CM | POA: Insufficient documentation

## 2020-01-20 DIAGNOSIS — F17200 Nicotine dependence, unspecified, uncomplicated: Secondary | ICD-10-CM

## 2020-01-20 DIAGNOSIS — M545 Low back pain: Secondary | ICD-10-CM | POA: Insufficient documentation

## 2020-01-20 LAB — SARS CORONAVIRUS 2 (TAT 6-24 HRS): SARS Coronavirus 2: NEGATIVE

## 2020-01-20 MED ORDER — HYDROCHLOROTHIAZIDE 25 MG PO TABS: 25.0000 mg | ORAL_TABLET | Freq: Every day | ORAL | 6 refills | Status: DC

## 2020-01-20 MED ORDER — ALBUTEROL SULFATE (2.5 MG/3ML) 0.083% IN NEBU: 2.5000 mg | INHALATION_SOLUTION | Freq: Four times a day (QID) | RESPIRATORY_TRACT | 1 refills | Status: AC | PRN

## 2020-01-20 MED ORDER — AMLODIPINE BESYLATE 10 MG PO TABS: 10.0000 mg | ORAL_TABLET | Freq: Every day | ORAL | 6 refills | Status: DC

## 2020-01-20 MED ORDER — NICOTINE 14 MG/24HR TD PT24: 14.0000 mg | MEDICATED_PATCH | Freq: Every day | TRANSDERMAL | 1 refills | Status: DC

## 2020-01-20 MED FILL — ?AMLODIPINE BESYL 10MG TABL: 10 | 30 days supply | Qty: 30 | Fill #0

## 2020-01-20 MED FILL — HYDROCHLOROTHIAZIDE 25 MG T: 25 | 30 days supply | Qty: 30 | Fill #0

## 2020-01-20 MED FILL — ALBUTEROL SUL 2.5 MG/3 ML S: (2.5 MG/3ML | 12 days supply | Qty: 150 | Fill #0

## 2020-01-20 MED FILL — NICOTINE 14 MG/24HR PATCH: 14 | 28 days supply | Qty: 28 | Fill #0

## 2020-01-20 NOTE — Patient Instructions (Signed)
Follow-up with the clinical pharmacist in 2 weeks for repeat blood pressure check.  Your blood pressure is not at goal.  We have increase the hydrochlorothiazide to 25 mg daily. Continue to monitor your blood pressure at home.  The goal is 130/80 or lower.  I have prescribed the nicotine patches for you to use to help with smoking cessation.  You can use the albuterol inhaler at bedtime if you feel your breathing is worse at nights.  We have also prescribed a nebulizer machine and the liquid treatments to go home with it.

## 2020-01-20 NOTE — Progress Notes (Signed)
Pt states she has been coughing since last week. Pt states her breathing has not been the same

## 2020-01-20 NOTE — Progress Notes (Signed)
Patient ID: JERONDA DON, female    DOB: 1965-09-03  MRN: [id number]  CC: Hypertension   Subjective: [name] [name] is a 54 y.o. female who presents for follow-up dyspnea on exertion/chronic cough and hypertension.   Her concerns today include:  Patient with history of HTN, tobdep, bipolar disorder, HL, chronic LBPwhich she attributes to arthritis, ETOHabusein remission, ACD, marijuanain remission, GERD  Asthma: Since last visit with me, she has seen Dr. Delford Field.  His assessment is that she probably has moderate persistent asthma with GERD also playing a role.  He had given her a short course of prednisone which she found helpful.  She was continued on Dulera and albuterol inhaler.  She has appointment for PFTs later this wk.  Screen for mold was negative.  She does not have elevated IgE levels. -Doing better with Dulera BID. Uses Albuterol once a day in the mornings.  Breathing and cough worse at nights. -having a hard time with smoking cessation.  She feels the nicotine lozenges that were prescribed by Dr. Delford Field were too strong.  Would like to try the patches. 1 pk cigarettes last 1 wk - smoking 6-7 cigarettes/day -Her chest x-ray was normal.  proBNP normal.  HTN: Reports compliance with Norvasc, hydrochlorothiazide and Cozaar.  She is limiting salt in the foods as much as she can.  Patient Active Problem List   Diagnosis Date Noted  . Moderate persistent asthma with acute exacerbation 12/30/2019  . Chronic coughing 12/30/2019  . Seasonal allergies 12/30/2019  . Loud snoring 12/16/2019  . Prediabetes 01/04/2019  . Obesity (BMI 30-39.9) 01/04/2019  . Ganglion cyst 09/28/2018  . Carpal tunnel syndrome of right wrist 09/28/2018  . Chronic midline low back pain without sciatica 11/23/2017  . Essential hypertension 11/23/2017  . Chronic pain of right knee 11/23/2017  . 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 Marijuana abuse 11/23/2017  . Tobacco dependence 11/23/2017   . Alcohol abuse 07/30/2016  . S/P ACL reconstruction 01/31/2014  . DEPRESSION 11/17/2008  . ALLERGIC RHINITIS 11/17/2008  . GERD 11/17/2008     Current Outpatient Medications on File Prior to Visit  Medication Sig Dispense Refill  . acetaminophen (TYLENOL) 500 MG tablet Take 1 tablet (500 mg total) by mouth every 6 (six) hours as needed for up to 30 doses. 30 tablet 0  . albuterol (VENTOLIN HFA) 108 (90 Base) MCG/ACT inhaler Inhale 2 puffs into the lungs every 6 (six) hours as needed for wheezing or shortness of breath. 18 g 2  . Ascorbic Acid (VITAMIN C PO) Take 1 tablet by mouth daily.    Marland Kitchen atorvastatin (LIPITOR) 10 MG tablet Take 1 tablet (10 mg total) by mouth daily. 90 tablet 3  . baclofen (LIORESAL) 10 MG tablet Take 0.5-1 tablets (5-10 mg total) by mouth at bedtime as needed for muscle spasms. 30 each 3  . celecoxib (CELEBREX) 200 MG capsule Take 1 capsule (200 mg total) by mouth 2 (two) times daily as needed. 60 capsule 6  . cetirizine (ZYRTEC) 10 MG tablet Take 1 tablet (10 mg total) by mouth daily. 30 tablet 11  . Cyanocobalamin (VITAMIN B-12 PO) Take 1 tablet by mouth daily.    . Diclofenac Sodium 3 % GEL Apply 1 each topically 2 (two) times daily. 100 g 3  . fluticasone (FLONASE) 50 MCG/ACT nasal spray Place 2 sprays into both nostrils daily. 16 g 2  . gabapentin (NEURONTIN) 300 MG capsule Take 3 capsules (900 mg total) by mouth at bedtime. 90  capsule 3  . hydrOXYzine (ATARAX/VISTARIL) 25 MG tablet Take 1 tablet (25 mg total) by mouth at bedtime as needed (sleep). 30 tablet 3  . losartan (COZAAR) 50 MG tablet Take 0.5 tablets (25 mg total) by mouth daily. 30 tablet 3  . methocarbamol (ROBAXIN) 500 MG tablet Take 2 tablets (1,000 mg total) by mouth 4 (four) times daily. 90 tablet 0  . mometasone-formoterol (DULERA) 200-5 MCG/ACT AERO Inhale 2 puffs into the lungs 2 (two) times daily. 13 g 3  . Multiple Vitamin (MULTIVITAMIN) tablet Take 1 tablet by mouth daily.    . ondansetron  (ZOFRAN ODT) 4 MG disintegrating tablet Take 1 tablet (4 mg total) by mouth every 8 (eight) hours as needed for nausea or vomiting. 20 tablet 0  . pantoprazole (PROTONIX) 40 MG tablet Take 1 tablet (40 mg total) by mouth daily. 30 tablet 3  . polyethylene glycol powder (GLYCOLAX/MIRALAX) 17 GM/SCOOP powder Take 17 g by mouth daily as needed for mild constipation. 3350 g 1  . potassium chloride (KLOR-CON) 10 MEQ tablet Take 1 tablet (10 mEq total) by mouth daily. 30 tablet 2  . promethazine-dextromethorphan (PROMETHAZINE-DM) 6.25-15 MG/5ML syrup Take 5 mLs by mouth 4 (four) times daily as needed for cough. 240 mL 0  . sertraline (ZOLOFT) 50 MG tablet Take 1 tablet (50 mg total) by mouth daily. 30 tablet 1  . Spacer/Aero-Holding Chambers (AEROCHAMBER MV) inhaler Use as instructed 1 each 0  . SUMAtriptan (IMITREX) 100 MG tablet Take 1 tablet earliest onset of migraine.  May repeat in 2 hours if headache persists or recurs.  Maximum 2 tablets in 24 hours. 10 tablet 3  . topiramate (TOPAMAX) 50 MG tablet Take 1 tablet (50 mg total) by mouth at bedtime. 30 tablet 3  . triamcinolone cream (KENALOG) 0.1 % Apply 1 application topically 2 (two) times daily. 30 g 0   No current facility-administered medications on file prior to visit.    Allergies  Allergen Reactions  . Diphenhydramine Hcl Itching  . Metoprolol Palpitations    Social History   Socioeconomic History  . Marital status: Legally Separated    Spouse name: Not on file  . Number of children: Not on file  . Years of education: Not on file  . Highest education level: Not on file  Occupational History  . Not on file  Tobacco Use  . Smoking status: Current Every Day Smoker    Packs/day: 0.25    Years: 32.00    Pack years: 8.00    Types: Cigarettes  . Smokeless tobacco: Never Used  Vaping Use  . Vaping Use: Never used  Substance and Sexual Activity  . Alcohol use: Not Currently  . Drug use: Not Currently    Types: Marijuana     Comment: occasional  marijuana--  hx  "crack" use  last used 2004--  no treatment rehab (done on her own)  . Sexual activity: Not on file  Other Topics Concern  . Not on file  Social History Narrative  . Not on file   Social Determinants of Health   Financial Resource Strain:   . Difficulty of Paying Living Expenses:   Food Insecurity:   . Worried About Programme researcher, broadcasting/film/video in the Last Year:   . Barista in the Last Year:   Transportation Needs:   . Freight forwarder (Medical):   Marland Kitchen Lack of Transportation (Non-Medical):   Physical Activity:   . Days of Exercise per Week:   .  Minutes of Exercise per Session:   Stress:   . Feeling of Stress :   Social Connections:   . Frequency of Communication with Friends and Family:   . Frequency of Social Gatherings with Friends and Family:   . Attends Religious Services:   . Active Member of Clubs or Organizations:   . Attends Banker Meetings:   Marland Kitchen Marital Status:   Intimate Partner Violence:   . Fear of Current or Ex-Partner:   . Emotionally Abused:   Marland Kitchen Physically Abused:   . Sexually Abused:     Family History  Problem Relation Age of Onset  . Hypertension Father     Past Surgical History:  Procedure Laterality Date  . ABDOMINAL HYSTERECTOMY    . BUNIONECTOMY Left 2011  . HEEL SPUR SURGERY Right 2012  . KNEE ARTHROSCOPY WITH ANTERIOR CRUCIATE LIGAMENT (ACL) REPAIR WITH HAMSTRING GRAFT Right 01/31/2014   Procedure: RIGHT KNEE ARTHROSCOPY WITH ALLOGRAFT (ACL) ANTERIOR CRUCIATE LIGAMENT RECONSTRUCTON PARTIAL MENISCECTOMY VERSES REPAIR ;  Surgeon: Eugenia Mcalpine, MD;  Location: [facility];  Service: Orthopedics;  Laterality: Right;  . TOTAL ABDOMINAL HYSTERECTOMY W/ BILATERAL SALPINGOOPHORECTOMY  04-20-2006    ROS: Review of Systems Negative except as stated above  PHYSICAL EXAM: BP (!) 146/96   Pulse 78   Temp 98.2 F (36.8 C)   Resp 16   Wt 195 lb 3.2 oz (88.5 kg)   SpO2 99%   BMI  33.51 kg/m   Physical Exam  General appearance - alert, well appearing, and in no distress Mental status - normal mood, behavior, speech, dress, motor activity, and thought processes Neck - supple, no significant adenopathy Chest - clear to auscultation, no wheezes, rales or rhonchi, symmetric air entry Heart - normal rate, regular rhythm, normal S1, S2, no murmurs, rubs, clicks or gallops Extremities - peripheral pulses normal, no pedal edema, no clubbing or cyanosis   CMP Latest Ref Rng & Units 12/19/2019 12/16/2019 07/14/2019  Glucose 70 - 99 mg/dL 95 78 95  BUN 6 - 20 mg/dL 11 12 11   Creatinine 0.44 - 1.00 mg/dL 2.35 3.61  Sodium 135 - 145 mmol/L 137 142 141  Potassium 3.5 - 5.1 mmol/L 4.5 4.7 3.7  Chloride 98 - 111 mmol/L 102 105 104  CO2 22 - 32 mmol/L 25 24 25   Calcium 8.9 - 10.3 mg/dL 9.4 9.5 9.5  Total Protein 6.0 - 8.5 g/dL - 7.6 -  Total Bilirubin 0.0 - 1.2 mg/dL - 0.2 -  Alkaline Phos 39 - 117 IU/L - 73 -  AST 0 - 40 IU/L - 23 -  ALT 0 - 32 IU/L - 17 -   Lipid Panel     Component Value Date/Time   CHOL 232 (H) 12/16/2019 1204   TRIG 195 (H) 12/16/2019 1204   HDL 44 12/16/2019 1204   CHOLHDL 5.3 (H) 12/16/2019 1204   CHOLHDL 4.5 Ratio 04/23/2007 2021   VLDL 39 04/23/2007 2021   LDLCALC 152 (H) 12/16/2019 1204    CBC    Component Value Date/Time   WBC 6.1 12/16/2019 1204   WBC 9.0 07/14/2019 1051   RBC 4.23 12/16/2019 1204   RBC 4.22 07/14/2019 1051   HGB 13.0 12/16/2019 1204   HCT 37.9 12/16/2019 1204   PLT 363 12/16/2019 1204   MCV 90 12/16/2019 1204   MCH 30.7 12/16/2019 1204   MCH 29.9 07/14/2019 1051   MCHC 34.3 12/16/2019 1204   MCHC 32.2 07/14/2019 1051  RDW 12.9 12/16/2019 1204   LYMPHSABS 2.4 07/14/2019 1051   MONOABS 0.7 07/14/2019 1051   EOSABS 0.0 07/14/2019 1051   BASOSABS 0.0 07/14/2019 1051    ASSESSMENT AND PLAN: 1. Asthma, moderate persistent, poorly-controlled -Continue Dulera twice a day.  She has been shown proper  technique by Dr. Joya Gaskins on last visit and is using a spacer.  Advised that she can use the albuterol more than once a day.  She should use it at night since her breathing tends to be worse at night. -We have also supplied her with a nebulizer machine and the liquid treatments to go along with it to use as needed.  2. Essential hypertension Not at goal.  Increase hydrochlorothiazide to 25 mg daily.  Continue amlodipine and Cozaar.  Follow-up with clinical pharmacist in 2 weeks for repeat blood pressure check.  On that visit, if we have to increase Cozaar, please order a BMP to be done that day -Advised to continue low-salt diet.  Continue to monitor home blood pressure with goal being 130/80 or lower.  Bring in readings on follow-up visit with clinical pharmacist - amLODipine (NORVASC) 10 MG tablet; Take 1 tablet (10 mg total) by mouth daily.  Dispense: 30 tablet; Refill: 6 - hydrochlorothiazide (HYDRODIURIL) 25 MG tablet; Take 1 tablet (25 mg total) by mouth daily.  Dispense: 30 tablet; Refill: 6  3. Tobacco dependence Advised to quit.  Patient wanting to quit.  She would like to try the nicotine patches instead of the lozenges.  I went over with her how to use the patches.  We will start her on intermediate dose of 14 mg. - nicotine (NICODERM CQ - DOSED IN MG/24 HOURS) 14 mg/24hr patch; Place 1 patch (14 mg total) onto the skin daily.  Dispense: 28 patch; Refill: 1   Patient was given the opportunity to ask questions.  Patient verbalized understanding of the plan and was able to repeat key elements of the plan.   No orders of the defined types were placed in this encounter.    Requested Prescriptions   Signed Prescriptions Disp Refills  . amLODipine (NORVASC) 10 MG tablet 30 tablet 6    Sig: Take 1 tablet (10 mg total) by mouth daily.  . hydrochlorothiazide (HYDRODIURIL) 25 MG tablet 30 tablet 6    Sig: Take 1 tablet (25 mg total) by mouth daily.  . nicotine (NICODERM CQ - DOSED IN MG/24  HOURS) 14 mg/24hr patch 28 patch 1    Sig: Place 1 patch (14 mg total) onto the skin daily.  Marland Kitchen albuterol (PROVENTIL) (2.5 MG/3ML) 0.083% nebulizer solution 150 mL 1    Sig: Take 3 mLs (2.5 mg total) by nebulization every 6 (six) hours as needed for wheezing or shortness of breath.    Return in about 3 months (around 04/21/2020) for [facility] in 2 weeks for blood pressure recheck.  Karle Plumber, MD, FACP

## 2020-01-21 ENCOUNTER — Encounter: Payer: No Typology Code available for payment source | Admitting: Sports Medicine

## 2020-01-22 ENCOUNTER — Other Ambulatory Visit: Payer: Self-pay

## 2020-01-22 ENCOUNTER — Encounter (HOSPITAL_COMMUNITY): Payer: Medicaid Other

## 2020-01-22 ENCOUNTER — Ambulatory Visit (HOSPITAL_BASED_OUTPATIENT_CLINIC_OR_DEPARTMENT_OTHER): Payer: Self-pay | Attending: Internal Medicine | Admitting: Internal Medicine

## 2020-01-22 VITALS — Ht 64.0 in | Wt 195.0 lb

## 2020-01-22 DIAGNOSIS — R0683 Snoring: Secondary | ICD-10-CM

## 2020-01-22 DIAGNOSIS — G4733 Obstructive sleep apnea (adult) (pediatric): Secondary | ICD-10-CM | POA: Insufficient documentation

## 2020-01-22 DIAGNOSIS — Z1231 Encounter for screening mammogram for malignant neoplasm of breast: Secondary | ICD-10-CM

## 2020-01-26 ENCOUNTER — Other Ambulatory Visit: Payer: Self-pay | Admitting: Internal Medicine

## 2020-01-26 DIAGNOSIS — G4733 Obstructive sleep apnea (adult) (pediatric): Secondary | ICD-10-CM

## 2020-01-26 DIAGNOSIS — R0683 Snoring: Secondary | ICD-10-CM

## 2020-01-26 NOTE — Procedures (Signed)
    Patient Name: [name] [name], [name] [name] Date: 01/22/2020 Gender: Female D.O.B: Jan 20, 1966 Age (years): 8 Referring Provider: Jonah Blue MD Height (inches): 64 Interpreting Physician: Jetty Duhamel MD, ABSM Weight (lbs): 195 RPSGT: Armen Pickup BMI: 33 MRN: [id number] Neck Size: 15.00  CLINICAL INFORMATION Sleep Study Type: NPSG Indication for sleep study: Snoring Epworth Sleepiness Score: 0  SLEEP STUDY TECHNIQUE As per the AASM Manual for the Scoring of Sleep and Associated Events v2.3 (April 2016) with a hypopnea requiring 4% desaturations.  The channels recorded and monitored were frontal, central and occipital EEG, electrooculogram (EOG), submentalis EMG (chin), nasal and oral airflow, thoracic and abdominal wall motion, anterior tibialis EMG, snore microphone, electrocardiogram, and pulse oximetry.  MEDICATIONS Medications self-administered by patient taken the night of the study : none reported  SLEEP ARCHITECTURE The study was initiated at 10:29:52 PM and ended at 5:02:49 AM.  Sleep onset time was 124.4 minutes and the sleep efficiency was 53.1%%. The total sleep time was 208.5 minutes.  Stage REM latency was 94.5 minutes.  The patient spent 10.6%% of the night in stage N1 sleep, 68.3%% in stage N2 sleep, 1.2%% in stage N3 and 19.9% in REM.  Alpha intrusion was absent.  Supine sleep was 0.00%.  RESPIRATORY PARAMETERS The overall apnea/hypopnea index (AHI) was 12.4 per hour. There were 24 total apneas, including 21 obstructive, 3 central and 0 mixed apneas. There were 19 hypopneas and 30 RERAs.  The AHI during Stage REM sleep was 28.9 per hour.  AHI while supine was N/A per hour.  The mean oxygen saturation was 92.7%. The minimum SpO2 during sleep was 83.0%.  loud snoring was noted during this study.  CARDIAC DATA The 2 lead EKG demonstrated sinus rhythm. The mean heart rate was 64.3 beats per minute. Other EKG findings include: None.  LEG MOVEMENT  DATA The total PLMS were 0 with a resulting PLMS index of 0.0. Associated arousal with leg movement index was 1.7 .  IMPRESSIONS - Mild obstructive sleep apnea occurred during this study (AHI = 12.4/h). - No significant central sleep apnea occurred during this study (CAI = 0.9/h). - Mild oxygen desaturation was noted during this study (Min O2 = 83.0%). Mean sat 93.8%. - The patient snored with loud snoring volume. - No cardiac abnormalities were noted during this study. - Clinically significant periodic limb movements did not occur during sleep.  DIAGNOSIS - Obstructive Sleep Apnea (327.23 [G47.33 ICD-10])  RECOMMENDATIONS - Suggest CPAP titration sleep study or autopap. Other options would be based on clinical judgment. - Be careful with alcohol, sedatives and other CNS depressants that may worsen sleep apnea and disrupt normal sleep architecture. - Sleep hygiene should be reviewed to assess factors that may improve sleep quality. - Weight management and regular exercise should be initiated or continued if appropriate.  [Electronically signed] 01/26/2020 09:19 AM  Jetty Duhamel MD, ABSM Diplomate, American Board of Sleep Medicine   NPI: [id number]                         Jetty Duhamel Diplomate, American Board of Sleep Medicine  ELECTRONICALLY SIGNED ON:  01/26/2020, 9:16 AM [facility] PH: [phone]   FX: [phone] ACCREDITED BY THE AMERICAN ACADEMY OF SLEEP MEDICINE

## 2020-01-26 NOTE — Progress Notes (Signed)
Let pt know that her sleep study revealed that she does have sleep apnea.  Will need to have second part of the study called the titration study.  I have placed the order.

## 2020-01-27 ENCOUNTER — Telehealth: Payer: Self-pay

## 2020-01-27 NOTE — Telephone Encounter (Signed)
Contacted pt to go over Sleep Study results pt is aware and doesn't have any questions or concerns

## 2020-02-15 ENCOUNTER — Other Ambulatory Visit (HOSPITAL_COMMUNITY): Payer: Medicaid Other

## 2020-02-15 ENCOUNTER — Other Ambulatory Visit (HOSPITAL_COMMUNITY)
Admission: RE | Admit: 2020-02-15 | Discharge: 2020-02-15 | Disposition: A | Discharge status: Home / Self Care | Payer: Medicaid Other | Source: Ambulatory Visit | Attending: Internal Medicine | Admitting: Internal Medicine

## 2020-02-15 DIAGNOSIS — Z20822 Contact with and (suspected) exposure to covid-19: Secondary | ICD-10-CM | POA: Insufficient documentation

## 2020-02-15 DIAGNOSIS — Z01812 Encounter for preprocedural laboratory examination: Secondary | ICD-10-CM | POA: Insufficient documentation

## 2020-02-15 LAB — SARS CORONAVIRUS 2 (TAT 6-24 HRS): SARS Coronavirus 2: NEGATIVE

## 2020-02-17 ENCOUNTER — Ambulatory Visit (HOSPITAL_COMMUNITY)
Admission: RE | Admit: 2020-02-17 | Discharge: 2020-02-17 | Disposition: A | Discharge status: Home / Self Care | Payer: Medicaid Other | Source: Ambulatory Visit | Attending: Internal Medicine | Admitting: Internal Medicine

## 2020-02-17 ENCOUNTER — Other Ambulatory Visit: Payer: Self-pay

## 2020-02-17 DIAGNOSIS — R05 Cough: Secondary | ICD-10-CM | POA: Insufficient documentation

## 2020-02-17 DIAGNOSIS — R053 Chronic cough: Secondary | ICD-10-CM

## 2020-02-17 DIAGNOSIS — R0600 Dyspnea, unspecified: Secondary | ICD-10-CM | POA: Insufficient documentation

## 2020-02-17 DIAGNOSIS — R0609 Other forms of dyspnea: Secondary | ICD-10-CM

## 2020-02-17 LAB — PULMONARY FUNCTION TEST
DL/VA % pred: 83 %
DL/VA: 3.55 ml/min/mmHg/L
DLCO unc % pred: 75 %
DLCO unc: 15.62 ml/min/mmHg
FEF 25-75 Post: 3.29 L/sec
FEF 25-75 Pre: 2.73 L/sec
FEF2575-%Change-Post: 20 %
FEF2575-%Pred-Post: 145 %
FEF2575-%Pred-Pre: 120 %
FEV1-%Change-Post: 6 %
FEV1-%Pred-Post: 105 %
FEV1-%Pred-Pre: 99 %
FEV1-Post: 2.34 L
FEV1-Pre: 2.19 L
FEV1FVC-%Change-Post: -4 %
FEV1FVC-%Pred-Pre: 108 %
FEV6-%Change-Post: 11 %
FEV6-%Pred-Post: 103 %
FEV6-%Pred-Pre: 93 %
FEV6-Post: 2.8 L
FEV6-Pre: 2.52 L
FEV6FVC-%Pred-Post: 103 %
FEV6FVC-%Pred-Pre: 103 %
FVC-%Change-Post: 11 %
FVC-%Pred-Post: 100 %
FVC-%Pred-Pre: 90 %
FVC-Post: 2.8 L
FVC-Pre: 2.52 L
Post FEV1/FVC ratio: 83 %
Post FEV6/FVC ratio: 100 %
Pre FEV1/FVC ratio: 87 %
Pre FEV6/FVC Ratio: 100 %
RV % pred: 110 %
RV: 2.09 L
TLC % pred: 98 %
TLC: 5 L

## 2020-02-17 MED ORDER — ALBUTEROL SULFATE (2.5 MG/3ML) 0.083% IN NEBU
2.5000 mg | INHALATION_SOLUTION | Freq: Once | RESPIRATORY_TRACT | Status: AC
  Administered 2020-02-17: 2.5 mg via RESPIRATORY_TRACT

## 2020-02-18 ENCOUNTER — Ambulatory Visit (HOSPITAL_BASED_OUTPATIENT_CLINIC_OR_DEPARTMENT_OTHER): Payer: Medicaid Other | Attending: Internal Medicine | Admitting: Internal Medicine

## 2020-02-18 ENCOUNTER — Encounter (HOSPITAL_COMMUNITY): Payer: Self-pay

## 2020-02-18 ENCOUNTER — Telehealth: Payer: Self-pay | Admitting: Internal Medicine

## 2020-02-18 ENCOUNTER — Other Ambulatory Visit: Payer: Self-pay

## 2020-02-18 ENCOUNTER — Emergency Department (HOSPITAL_COMMUNITY)
Admission: EM | Admit: 2020-02-18 | Discharge: 2020-02-18 | Disposition: A | Discharge status: Home / Self Care | Payer: Medicaid Other | Attending: Emergency Medicine | Admitting: Emergency Medicine

## 2020-02-18 DIAGNOSIS — L245 Irritant contact dermatitis due to other chemical products: Secondary | ICD-10-CM

## 2020-02-18 DIAGNOSIS — F17210 Nicotine dependence, cigarettes, uncomplicated: Secondary | ICD-10-CM | POA: Insufficient documentation

## 2020-02-18 DIAGNOSIS — I10 Essential (primary) hypertension: Secondary | ICD-10-CM | POA: Insufficient documentation

## 2020-02-18 DIAGNOSIS — Z7951 Long term (current) use of inhaled steroids: Secondary | ICD-10-CM | POA: Insufficient documentation

## 2020-02-18 DIAGNOSIS — R21 Rash and other nonspecific skin eruption: Secondary | ICD-10-CM | POA: Insufficient documentation

## 2020-02-18 DIAGNOSIS — J45909 Unspecified asthma, uncomplicated: Secondary | ICD-10-CM | POA: Insufficient documentation

## 2020-02-18 DIAGNOSIS — Z79899 Other long term (current) drug therapy: Secondary | ICD-10-CM | POA: Insufficient documentation

## 2020-02-18 MED ORDER — HYDROCORTISONE 1 % EX CREA
TOPICAL_CREAM | Freq: Once | CUTANEOUS | Status: AC
  Filled 2020-02-18: qty 28

## 2020-02-18 MED ORDER — BETAMETHASONE DIPROPIONATE 0.05 % EX CREA
TOPICAL_CREAM | Freq: Two times a day (BID) | CUTANEOUS | 0 refills | Status: DC
Start: 2020-02-18 — End: 2020-06-09

## 2020-02-18 NOTE — Telephone Encounter (Signed)
Pt called back about not receiving a call to discuss results/ please advise

## 2020-02-18 NOTE — ED Provider Notes (Signed)
[facility]-EMERGENCY DEPT Provider Note   CSN: [id number] Arrival date & time: 02/18/20  2015     History Chief Complaint  Patient presents with  . Rash    [name] RippleStephanie L Kitchings is a 54 y.o. female.  Patient c/o itching/rash to bil hands for past few days. Has noted areas of dry, scaly skin to hands. States recently started using new hand cream/soap, as well as has several hand sanitizer lotions around the house. Pt otherwise denies any new exposure, whether home or at work. No other new products. No new meds. No general body rash, only bil hands. States otherwise does not feel sick or ill, no other new symptoms. No fevers.   The history is provided by the patient.  Rash Associated symptoms: no fever, no shortness of breath, no sore throat and not vomiting        Past Medical History:  Diagnosis Date  . Asthma   . Bipolar 1 disorder (HCC)   . Chronic lumbar pain   . DDD (degenerative disc disease)   . Depression   . GERD (gastroesophageal reflux disease)   . History of gastric ulcer   . Hyperlipidemia   . Hypertension   . Migraine   . Osteoarthritis   . Right ACL tear   . Right knee meniscal tear   . Sciatica   . Wears glasses     Patient Active Problem List   Diagnosis Date Noted  . Moderate persistent asthma with acute exacerbation 12/30/2019  . Chronic coughing 12/30/2019  . Seasonal allergies 12/30/2019  . Loud snoring 12/16/2019  . Prediabetes 01/04/2019  . Obesity (BMI 30-39.9) 01/04/2019  . Ganglion cyst 09/28/2018  . Carpal tunnel syndrome of right wrist 09/28/2018  . Chronic midline low back pain without sciatica 11/23/2017  . Essential hypertension 11/23/2017  . Chronic pain of right knee 11/23/2017  . 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 Marijuana abuse 11/23/2017  . Tobacco dependence 11/23/2017  . Alcohol abuse 07/30/2016  . S/P ACL reconstruction 01/31/2014  . DEPRESSION 11/17/2008  . ALLERGIC RHINITIS 11/17/2008   . GERD 11/17/2008    Past Surgical History:  Procedure Laterality Date  . ABDOMINAL HYSTERECTOMY    . BUNIONECTOMY Left 2011  . HEEL SPUR SURGERY Right 2012  . KNEE ARTHROSCOPY WITH ANTERIOR CRUCIATE LIGAMENT (ACL) REPAIR WITH HAMSTRING GRAFT Right 01/31/2014   Procedure: RIGHT KNEE ARTHROSCOPY WITH ALLOGRAFT (ACL) ANTERIOR CRUCIATE LIGAMENT RECONSTRUCTON PARTIAL MENISCECTOMY VERSES REPAIR ;  Surgeon: Eugenia Mcalpineobert Collins, MD;  [facility];  Service: Orthopedics;  Laterality: Right;  . TOTAL ABDOMINAL HYSTERECTOMY W/ BILATERAL SALPINGOOPHORECTOMY  04-20-2006     OB History   No obstetric history on file.     Family History  Problem Relation Age of Onset  . Hypertension Father     Social History   Tobacco Use  . Smoking status: Current Every Day Smoker    Packs/day: 0.25    Years: 32.00    Pack years: 8.00    Types: Cigarettes  . Smokeless tobacco: Never Used  Vaping Use  . Vaping Use: Never used  Substance Use Topics  . Alcohol use: Not Currently  . Drug use: Not Currently    Types: Marijuana    Comment: occasional  marijuana--  hx  "crack" use  last used 2004--  no treatment rehab (done on her own)    Home Medications Prior to Admission medications   Medication Sig Start Date End Date Taking? Authorizing Provider  acetaminophen (  TYLENOL) 500 MG tablet Take 1 tablet (500 mg total) by mouth every 6 (six) hours as needed for up to 30 doses. 06/27/19   Curatolo, Adam, DO  albuterol (PROVENTIL) (2.5 MG/3ML) 0.083% nebulizer solution Take 3 mLs (2.5 mg total) by nebulization every 6 (six) hours as needed for wheezing or shortness of breath. 01/20/20   Marcine Matar, MD  albuterol (VENTOLIN HFA) 108 (90 Base) MCG/ACT inhaler Inhale 2 puffs into the lungs every 6 (six) hours as needed for wheezing or shortness of breath. 12/16/19   Marcine Matar, MD  amLODipine (NORVASC) 10 MG tablet Take 1 tablet (10 mg total) by mouth daily. 01/20/20   Marcine Matar, MD  Ascorbic Acid (VITAMIN C PO) Take 1 tablet by mouth daily.    [provider]  atorvastatin (LIPITOR) 10 MG tablet Take 1 tablet (10 mg total) by mouth daily. 12/18/19   Marcine Matar, MD  baclofen (LIORESAL) 10 MG tablet Take 0.5-1 tablets (5-10 mg total) by mouth at bedtime as needed for muscle spasms. 11/01/19   Hilts, Casimiro Needle, MD  celecoxib (CELEBREX) 200 MG capsule Take 1 capsule (200 mg total) by mouth 2 (two) times daily as needed. 11/01/19   Hilts, Casimiro Needle, MD  cetirizine (ZYRTEC) 10 MG tablet Take 1 tablet (10 mg total) by mouth daily. 12/30/19   Storm Frisk, MD  Cyanocobalamin (VITAMIN B-12 PO) Take 1 tablet by mouth daily.    [provider]  Diclofenac Sodium 3 % GEL Apply 1 each topically 2 (two) times daily. 09/25/19   Anders Simmonds, PA-C  fluticasone (FLONASE) 50 MCG/ACT nasal spray Place 2 sprays into both nostrils daily. 12/30/19   Storm Frisk, MD  gabapentin (NEURONTIN) 300 MG capsule Take 3 capsules (900 mg total) by mouth at bedtime. 09/25/19   Anders Simmonds, PA-C  hydrochlorothiazide (HYDRODIURIL) 25 MG tablet Take 1 tablet (25 mg total) by mouth daily. 01/20/20   Marcine Matar, MD  hydrOXYzine (ATARAX/VISTARIL) 25 MG tablet Take 1 tablet (25 mg total) by mouth at bedtime as needed (sleep). 10/30/19   Anders Simmonds, PA-C  losartan (COZAAR) 50 MG tablet Take 0.5 tablets (25 mg total) by mouth daily. 12/16/19   Marcine Matar, MD  methocarbamol (ROBAXIN) 500 MG tablet Take 2 tablets (1,000 mg total) by mouth 4 (four) times daily. 10/30/19   Anders Simmonds, PA-C  mometasone-formoterol (DULERA) 200-5 MCG/ACT AERO Inhale 2 puffs into the lungs 2 (two) times daily. 12/30/19   Storm Frisk, MD  Multiple Vitamin (MULTIVITAMIN) tablet Take 1 tablet by mouth daily.    [provider]  nicotine (NICODERM CQ - DOSED IN MG/24 HOURS) 14 mg/24hr patch Place 1 patch (14 mg total) onto the skin daily. 01/20/20   Marcine Matar, MD  ondansetron (ZOFRAN ODT) 4 MG disintegrating tablet Take 1 tablet (4 mg total) by mouth every 8 (eight) hours as needed for nausea or vomiting. 10/31/19   Henderly, Britni A, PA-C  pantoprazole (PROTONIX) 40 MG tablet Take 1 tablet (40 mg total) by mouth daily. 12/30/19   Storm Frisk, MD  polyethylene glycol powder (GLYCOLAX/MIRALAX) 17 GM/SCOOP powder Take 17 g by mouth daily as needed for mild constipation. 11/18/19   Marcine Matar, MD  potassium chloride (KLOR-CON) 10 MEQ tablet Take 1 tablet (10 mEq total) by mouth daily. 08/22/19   Anders Simmonds, PA-C  promethazine-dextromethorphan (PROMETHAZINE-DM) 6.25-15 MG/5ML syrup Take 5 mLs by mouth 4 (four)  times daily as needed for cough. 12/30/19   Storm Frisk, MD  sertraline (ZOLOFT) 50 MG tablet Take 1 tablet (50 mg total) by mouth daily. 08/22/19   Anders Simmonds, PA-C  Spacer/Aero-Holding Chambers (AEROCHAMBER MV) inhaler Use as instructed 12/30/19   Storm Frisk, MD  SUMAtriptan (IMITREX) 100 MG tablet Take 1 tablet earliest onset of migraine.  May repeat in 2 hours if headache persists or recurs.  Maximum 2 tablets in 24 hours. 11/13/19   Drema Dallas, DO  topiramate (TOPAMAX) 50 MG tablet Take 1 tablet (50 mg total) by mouth at bedtime. 11/20/19   Drema Dallas, DO  triamcinolone cream (KENALOG) 0.1 % Apply 1 application topically 2 (two) times daily. 05/24/19   Petrucelli, Samantha R, PA-C    Allergies    Diphenhydramine hcl and Metoprolol  Review of Systems   Review of Systems  Constitutional: Negative for chills and fever.  HENT: Negative for sore throat.   Respiratory: Negative for cough and shortness of breath.   Gastrointestinal: Negative for vomiting.  Skin: Positive for rash.    Physical Exam Updated Vital Signs BP (!) 159/104 (BP Location: Right Arm)   Pulse 71   Temp 98.3 F (36.8 C) (Oral)   Resp 17   Ht 1.626 m (5\' 4" )   Wt 88.5 kg   SpO2 98%   BMI 33.47 kg/m   Physical Exam  Vitals and nursing note reviewed.  Constitutional:      Appearance: Normal appearance. She is well-developed.  HENT:     Head: Atraumatic.     Nose: Nose normal.     Mouth/Throat:     Mouth: Mucous membranes are moist.     Pharynx: Oropharynx is clear.     Comments: No oral lesions. Eyes:     General: No scleral icterus.    Conjunctiva/sclera: Conjunctivae normal.  Neck:     Trachea: No tracheal deviation.  Cardiovascular:     Rate and Rhythm: Normal rate.     Pulses: Normal pulses.  Pulmonary:     Effort: Pulmonary effort is normal. No respiratory distress.     Breath sounds: No wheezing.  Abdominal:     General: There is no distension.     Palpations: Abdomen is soft.     Tenderness: There is no abdominal tenderness.  Genitourinary:    Comments: No cva tenderness.  Musculoskeletal:        General: No swelling.     Cervical back: Normal range of motion and neck supple. No rigidity. No muscular tenderness.     Right lower leg: No edema.     Left lower leg: No edema.  Skin:    General: Skin is warm and dry.     Comments: Sparse dry scaly patches to bil hands, palmar > dorsal. No specific lesions or scabies noted. No other body lesions or skin changes noted.   Neurological:     Mental Status: She is alert.     Comments: Alert, speech normal.   Psychiatric:        Mood and Affect: Mood normal.     ED Results / Procedures / Treatments   Labs (all labs ordered are listed, but only abnormal results are displayed) Labs Reviewed - No data to display  EKG None  Radiology No results found.  Procedures Procedures (including critical care time)  Medications Ordered in ED Medications  hydrocortisone cream 1 % (has no administration in time range)    ED  Course  I have reviewed the triage vital signs and the nursing notes.  Pertinent labs & imaging results that were available during my care of the patient were reviewed by me and considered in my medical decision  making (see chart for details).    MDM Rules/Calculators/A&P                          Hydrocortisone cream applied.   Reviewed nursing notes and prior charts for additional history.   rx for home. Advised to avoid use of recent/new hand products at home. Uses gentle moisturizer.   bp elev. No headache. Pt notes hx same, and has adequate meds at home.    Final Clinical Impression(s) / ED Diagnoses Final diagnoses:  None    Rx / DC Orders ED Discharge Orders    None       Cathren Laine, MD 02/18/20 2226

## 2020-02-18 NOTE — Discharge Instructions (Addendum)
It was our pleasure to provide your ER care today - we hope that you feel better.  Avoid use of the new hand soap/cream, and the other hand sanitizers. Use steroid cream as prescribed. Also use non-perfumed, gentle moisturizer such as Eucerin or Aveeno.   Follow up with your doctor in 1-2 weeks - also have blood pressure rechecked  then, as it is high today.  Return to ER if worsening or new symptoms, fevers, or other concern.

## 2020-02-18 NOTE — Telephone Encounter (Signed)
Please f/u   Copied from CRM [id number]. Topic: General - Other >> Feb 18, 2020 11:52 AM Herby Abraham C wrote: Reason for CRM: pt called in for her lab results.   Please call back at.  CB: [phone]

## 2020-02-18 NOTE — ED Triage Notes (Signed)
Patient arrived stating that since Friday she has had a rash and itching. States she has not used any new products other than hand sanitizer and a possible new soap. Also states it is drying out her skin.

## 2020-02-19 NOTE — Telephone Encounter (Signed)
Pt contacted the office and is requesting PFT results

## 2020-02-24 NOTE — Telephone Encounter (Signed)
Contacted pt and went over Dr. Laural Benes message pt states she understands and she will wait on a call from [place] regarding results

## 2020-02-26 NOTE — Progress Notes (Signed)
Patient ID: [name] [name], female   DOB: [birth year], 54 y.o.   MRN: [id number]     [name] [name], is a 54 y.o. female  BEE:[id number]  JOI:[id number]  DOB - [birth year]  Subjective:  Chief Complaint and HPI: [name] [name] is a 54 y.o. female here today for several issues including ED/UC f/up from dry skin/rash on hands.  After being seen in the ED 02/18/2020 for rash on hands.  Treated with hydrocortisone.  Some improvement but slowly.  Worse on L hand.  No problems on other skin/scalp/waistband/ankles, etc.    Also, wants to change sleep med.  Hydroxyzine not working.  Wants ambien.  Trouble going and staying asleep.  Also c/o lower back pain with pain into upper thigh.  This is ongoing but worsens on and off.  NKI.  Pain is sharp and burning.  Worse with activity.  No weakness.  Says she sometimes sneaks a "pain pill" of her sister that helps and wants me to give her those.    Says she is compliant with meds and diet.    ROS:   Constitutional:  No f/c, No night sweats, No unexplained weight loss. EENT:  No vision changes, No blurry vision, No hearing changes. No mouth, throat, or ear problems.  Respiratory: No cough, No SOB Cardiac: No CP, no palpitations GI:  No abd pain, No N/V/D. GU: No Urinary s/sx Musculoskeletal:see above Neuro: No headache, no dizziness, no motor weakness.  Skin: No rash Endocrine:  No polydipsia. No polyuria.  Psych: Denies SI/HI  No problems updated.  ALLERGIES: Allergies  Allergen Reactions  . Diphenhydramine Hcl Itching  . Metoprolol Palpitations    PAST MEDICAL HISTORY: Past Medical History:  Diagnosis Date  . Asthma   . Bipolar 1 disorder (HCC)   . Chronic lumbar pain   . DDD (degenerative disc disease)   . Depression   . GERD (gastroesophageal reflux disease)   . History of gastric ulcer   . Hyperlipidemia   . Hypertension   . Migraine   . Osteoarthritis   . Right ACL tear   . Right knee meniscal tear   . Sciatica   .  Wears glasses     MEDICATIONS AT HOME: Prior to Admission medications   Medication Sig Start Date End Date Taking? Authorizing Provider  acetaminophen (TYLENOL) 500 MG tablet Take 1 tablet (500 mg total) by mouth every 6 (six) hours as needed for up to 30 doses. 06/27/19  Yes Curatolo, Adam, DO  albuterol (PROVENTIL) (2.5 MG/3ML) 0.083% nebulizer solution Take 3 mLs (2.5 mg total) by nebulization every 6 (six) hours as needed for wheezing or shortness of breath. 01/20/20  Yes Marcine Matar, MD  albuterol (VENTOLIN HFA) 108 (90 Base) MCG/ACT inhaler Inhale 2 puffs into the lungs every 6 (six) hours as needed for wheezing or shortness of breath. 12/16/19  Yes Marcine Matar, MD  amLODipine (NORVASC) 10 MG tablet Take 1 tablet (10 mg total) by mouth daily. 01/20/20  Yes Marcine Matar, MD  Ascorbic Acid (VITAMIN C PO) Take 1 tablet by mouth daily.   Yes [provider]  atorvastatin (LIPITOR) 10 MG tablet Take 1 tablet (10 mg total) by mouth daily. 12/18/19  Yes Marcine Matar, MD  baclofen (LIORESAL) 10 MG tablet Take 0.5-1 tablets (5-10 mg total) by mouth at bedtime as needed for muscle spasms. 11/01/19  Yes Hilts, Casimiro Needle, MD  betamethasone dipropionate 0.05 % cream Apply topically 2 (two) times daily. 02/18/20  Yes  Cathren Laine, MD  celecoxib (CELEBREX) 200 MG capsule Take 1 capsule (200 mg total) by mouth 2 (two) times daily as needed. 11/01/19  Yes Hilts, Casimiro Needle, MD  cetirizine (ZYRTEC) 10 MG tablet Take 1 tablet (10 mg total) by mouth daily. 12/30/19  Yes Storm Frisk, MD  Cyanocobalamin (VITAMIN B-12 PO) Take 1 tablet by mouth daily.   Yes [provider]  Diclofenac Sodium 3 % GEL Apply 1 each topically 2 (two) times daily. 09/25/19  Yes Tracye Szuch M, PA-C  fluticasone (FLONASE) 50 MCG/ACT nasal spray Place 2 sprays into both nostrils daily. 12/30/19  Yes Storm Frisk, MD  gabapentin (NEURONTIN) 300 MG capsule Take 3 capsules (900 mg total) by mouth  at bedtime. 09/25/19  Yes Georgian Co M, PA-C  hydrochlorothiazide (HYDRODIURIL) 25 MG tablet Take 1 tablet (25 mg total) by mouth daily. 02/27/20  Yes Tashi Andujo M, PA-C  losartan (COZAAR) 50 MG tablet Take 1 tablet (50 mg total) by mouth daily. 02/27/20  Yes Tavyn Kurka, Marzella Schlein, PA-C  methocarbamol (ROBAXIN) 500 MG tablet Take 2 tablets (1,000 mg total) by mouth 4 (four) times daily. 10/30/19  Yes Lacheryl Niesen M, PA-C  mometasone-formoterol (DULERA) 200-5 MCG/ACT AERO Inhale 2 puffs into the lungs 2 (two) times daily. 12/30/19  Yes Storm Frisk, MD  Multiple Vitamin (MULTIVITAMIN) tablet Take 1 tablet by mouth daily.   Yes [provider]  nicotine (NICODERM CQ - DOSED IN MG/24 HOURS) 14 mg/24hr patch Place 1 patch (14 mg total) onto the skin daily. 01/20/20  Yes Marcine Matar, MD  ondansetron (ZOFRAN ODT) 4 MG disintegrating tablet Take 1 tablet (4 mg total) by mouth every 8 (eight) hours as needed for nausea or vomiting. 10/31/19  Yes Henderly, Britni A, PA-C  pantoprazole (PROTONIX) 40 MG tablet Take 1 tablet (40 mg total) by mouth daily. 12/30/19  Yes Storm Frisk, MD  polyethylene glycol powder (GLYCOLAX/MIRALAX) 17 GM/SCOOP powder Take 17 g by mouth daily as needed for mild constipation. 11/18/19  Yes Marcine Matar, MD  potassium chloride (KLOR-CON) 10 MEQ tablet Take 1 tablet (10 mEq total) by mouth daily. 08/22/19  Yes Anders Simmonds, PA-C  promethazine-dextromethorphan (PROMETHAZINE-DM) 6.25-15 MG/5ML syrup Take 5 mLs by mouth 4 (four) times daily as needed for cough. 12/30/19  Yes Storm Frisk, MD  sertraline (ZOLOFT) 50 MG tablet Take 1 tablet (50 mg total) by mouth daily. 08/22/19  Yes Anders Simmonds, PA-C  Spacer/Aero-Holding Chambers (AEROCHAMBER MV) inhaler Use as instructed 12/30/19  Yes Storm Frisk, MD  SUMAtriptan (IMITREX) 100 MG tablet Take 1 tablet earliest onset of migraine.  May repeat in 2 hours if headache persists or recurs.   Maximum 2 tablets in 24 hours. 11/13/19  Yes Jaffe, Adam R, DO  topiramate (TOPAMAX) 50 MG tablet Take 1 tablet (50 mg total) by mouth at bedtime. 11/20/19  Yes Jaffe, Adam R, DO  triamcinolone cream (KENALOG) 0.1 % Apply 1 application topically 2 (two) times daily. 02/27/20  Yes Anders Simmonds, PA-C  traZODone (DESYREL) 50 MG tablet Take 1 tablet (50 mg total) by mouth at bedtime as needed for sleep. 02/27/20   Anders Simmonds, PA-C     Objective:  EXAM:   Vitals:   02/27/20 1017  BP: (!) 167/100  Pulse: 79  SpO2: 100%  Weight: 196 lb (88.9 kg)  Height: 5\' 4"  (1.626 m)    General appearance : A&OX3. NAD. Non-toxic-appearing HEENT: Atraumatic and Normocephalic.  PERRLA. EOM intact.   Chest/Lungs:  Breathing-non-labored, Good air entry bilaterally, breath sounds normal without rales, rhonchi, or wheezing  CVS: S1 S2 regular, no murmurs, gallops, rubs  Extremities: Bilateral Lower Ext shows no edema, both legs are warm to touch with = pulse throughout Neurology:  CN II-XII grossly intact, Non focal.   Psych:  TP scattered. J/I  fair. pressured speech. Appropriate eye contact and affect.  Skin:  Dry skin B hands  Data Review Lab Results  Component Value Date   HGBA1C 5.7 12/16/2019   HGBA1C 5.8 (H) 01/04/2019   HGBA1C 5.7 (H) 11/23/2017     Assessment & Plan   1. Essential hypertension Uncontrolled-increase losartan - losartan (COZAAR) 50 MG tablet; Take 1 tablet (50 mg total) by mouth daily.  Dispense: 30 tablet; Refill: 3 - hydrochlorothiazide (HYDRODIURIL) 25 MG tablet; Take 1 tablet (25 mg total) by mouth daily.  Dispense: 30 tablet; Refill: 6  2. Chronic left-sided low back pain with left-sided sciatica Use current meds.  Did not Rx narcotics - Ambulatory referral to Pain Clinic  3. Dermatitis - triamcinolone cream (KENALOG) 0.1 %; Apply 1 application topically 2 (two) times daily.  Dispense: 30 g; Refill: 2  4. Insomnia, unspecified type Stop hydroxyzine -  traZODone (DESYREL) 50 MG tablet; Take 1 tablet (50 mg total) by mouth at bedtime as needed for sleep.  Dispense: 30 tablet; Refill: 3  5. Encounter for examination following treatment at hospital   Patient have been counseled extensively about nutrition and exercise  Return for 3 weeks for tele BP with Franky Macho then 3 months with PCP.  The patient was given clear instructions to go to ER or return to medical center if symptoms don't improve, worsen or new problems develop. The patient verbalized understanding. The patient was told to call to get lab results if they haven't heard anything in the next week.     Georgian Co, PA-C [facility] [place], [place] [phone]   02/27/2020, 10:53 AM

## 2020-02-27 ENCOUNTER — Encounter: Payer: Self-pay | Admitting: Physician Assistant

## 2020-02-27 ENCOUNTER — Other Ambulatory Visit: Payer: Self-pay

## 2020-02-27 ENCOUNTER — Ambulatory Visit: Payer: Self-pay | Admitting: Physician Assistant

## 2020-02-27 VITALS — BP 167/100 | HR 79 | Ht 64.0 in | Wt 196.0 lb

## 2020-02-27 DIAGNOSIS — M5442 Lumbago with sciatica, left side: Secondary | ICD-10-CM

## 2020-02-27 DIAGNOSIS — I10 Essential (primary) hypertension: Secondary | ICD-10-CM

## 2020-02-27 DIAGNOSIS — Z09 Encounter for follow-up examination after completed treatment for conditions other than malignant neoplasm: Secondary | ICD-10-CM

## 2020-02-27 DIAGNOSIS — L309 Dermatitis, unspecified: Secondary | ICD-10-CM

## 2020-02-27 DIAGNOSIS — G8929 Other chronic pain: Secondary | ICD-10-CM

## 2020-02-27 DIAGNOSIS — G4700 Insomnia, unspecified: Secondary | ICD-10-CM

## 2020-02-27 MED ORDER — LOSARTAN POTASSIUM 50 MG PO TABS: 50.0000 mg | ORAL_TABLET | Freq: Every day | ORAL | 3 refills | Status: DC

## 2020-02-27 MED ORDER — HYDROCHLOROTHIAZIDE 25 MG PO TABS: 25.0000 mg | ORAL_TABLET | Freq: Every day | ORAL | 6 refills | Status: DC

## 2020-02-27 MED ORDER — TRIAMCINOLONE ACETONIDE 0.1 % EX CREA: 1.0000 "application " | TOPICAL_CREAM | Freq: Two times a day (BID) | CUTANEOUS | 2 refills | Status: DC

## 2020-02-27 MED ORDER — TRAZODONE HCL 50 MG PO TABS: 50.0000 mg | ORAL_TABLET | Freq: Every evening | ORAL | 3 refills | Status: DC | PRN

## 2020-02-27 NOTE — Progress Notes (Signed)
Having back pain and pain in legs.  Needs new sleep medication.

## 2020-02-27 NOTE — Patient Instructions (Signed)
Check BP out of office and record 3 times weekly.  Have available for tele appt in 3 weeks

## 2020-02-28 ENCOUNTER — Other Ambulatory Visit: Payer: Self-pay

## 2020-02-28 ENCOUNTER — Ambulatory Visit: Payer: Self-pay

## 2020-02-28 NOTE — Progress Notes (Signed)
Pt had an appt with financial on 7/23 and pt was aware of results in office

## 2020-03-03 ENCOUNTER — Other Ambulatory Visit: Payer: Self-pay

## 2020-03-03 ENCOUNTER — Ambulatory Visit
Admission: RE | Admit: 2020-03-03 | Discharge: 2020-03-03 | Disposition: A | Discharge status: Home / Self Care | Payer: No Typology Code available for payment source | Source: Ambulatory Visit | Attending: Internal Medicine | Admitting: Internal Medicine

## 2020-03-03 DIAGNOSIS — Z1231 Encounter for screening mammogram for malignant neoplasm of breast: Secondary | ICD-10-CM

## 2020-03-30 ENCOUNTER — Other Ambulatory Visit: Payer: Self-pay

## 2020-03-30 ENCOUNTER — Encounter: Payer: Self-pay | Admitting: Internal Medicine

## 2020-03-30 ENCOUNTER — Ambulatory Visit: Payer: Medicaid Other | Attending: Internal Medicine | Admitting: Internal Medicine

## 2020-03-30 VITALS — BP 155/101 | HR 98 | Temp 98.2°F | Resp 16 | Wt 190.2 lb

## 2020-03-30 DIAGNOSIS — F331 Major depressive disorder, recurrent, moderate: Secondary | ICD-10-CM

## 2020-03-30 DIAGNOSIS — F1020 Alcohol dependence, uncomplicated: Secondary | ICD-10-CM

## 2020-03-30 DIAGNOSIS — B353 Tinea pedis: Secondary | ICD-10-CM

## 2020-03-30 DIAGNOSIS — Z7189 Other specified counseling: Secondary | ICD-10-CM

## 2020-03-30 DIAGNOSIS — I10 Essential (primary) hypertension: Secondary | ICD-10-CM

## 2020-03-30 DIAGNOSIS — Z2821 Immunization not carried out because of patient refusal: Secondary | ICD-10-CM

## 2020-03-30 DIAGNOSIS — G5793 Unspecified mononeuropathy of bilateral lower limbs: Secondary | ICD-10-CM

## 2020-03-30 MED ORDER — DULOXETINE HCL 20 MG PO CPEP: 20.0000 mg | ORAL_CAPSULE | Freq: Every day | ORAL | 3 refills | Status: DC

## 2020-03-30 NOTE — Progress Notes (Signed)
Patient ID: [name] [name], female    DOB: February 03, 1966  MRN: [id number]  CC: Foot Pain   Subjective: [name] [name] is a 54 y.o. female who presents for chronic ds management Her concerns today include:  Patient with history of HTN, tobdep, bipolar disorder, HL, chronic LBPwhich she attributes to arthritis, ETOHabusein remission, ACD, marijuanain remission, GERD  HM:  Has not received COVID vaccine as yet.  Does not want flu shot  Complains today of feeling very depressed.  She states that she has been clean of street drugs for 17 years and is trying to establish a relationship with her 3 daughters and grandchildren.  However 2 of them constantly reminds her that she was not there for them when they were younger.  They do allow the grandchildren to come to her house but tell them that they do not have to obey her.  Patient states that this hurts a feelings and is making her very depressed.  She has started drinking more over the past several weeks.  Her sister has been a great support for her.  Her sister plans to set up a meeting between the patient and her daughters so that they can hear their grievances and try to decide whether to move forward in trying to have a meaningful relationship.  -Sometimes that her it is unbearable and she just does not know what to do with herself.  Denies any suicidal ideation at this time.  Feels she would benefit from some counseling and medication for depression.  She was prescribed Zoloft the early part of this year but patient states she did not like how the medicine made her feel so she discontinued taking it.  Complains of burning and pain in both feet that sometimes travels all the way up to her back.  She has not had any falls.  She has also had peeling of the skin on both feet.  Some itching at times.   HTN: Pressure elevated today.  Patient states she has not taken her medicines as yet for today.  She feels her blood pressure stays elevated  because she is still stressed and depressed and dealing with her daughters.   Patient Active Problem List   Diagnosis Date Noted  . Moderate persistent asthma with acute exacerbation 12/30/2019  . Chronic coughing 12/30/2019  . Seasonal allergies 12/30/2019  . Loud snoring 12/16/2019  . Prediabetes 01/04/2019  . Obesity (BMI 30-39.9) 01/04/2019  . Ganglion cyst 09/28/2018  . Carpal tunnel syndrome of right wrist 09/28/2018  . Chronic midline low back pain without sciatica 11/23/2017  . Essential hypertension 11/23/2017  . Chronic pain of right knee 11/23/2017  . 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 Marijuana abuse 11/23/2017  . Tobacco dependence 11/23/2017  . Alcohol abuse 07/30/2016  . S/P ACL reconstruction 01/31/2014  . DEPRESSION 11/17/2008  . ALLERGIC RHINITIS 11/17/2008  . GERD 11/17/2008     Current Outpatient Medications on File Prior to Visit  Medication Sig Dispense Refill  . acetaminophen (TYLENOL) 500 MG tablet Take 1 tablet (500 mg total) by mouth every 6 (six) hours as needed for up to 30 doses. 30 tablet 0  . albuterol (PROVENTIL) (2.5 MG/3ML) 0.083% nebulizer solution Take 3 mLs (2.5 mg total) by nebulization every 6 (six) hours as needed for wheezing or shortness of breath. 150 mL 1  . albuterol (VENTOLIN HFA) 108 (90 Base) MCG/ACT inhaler Inhale 2 puffs into the lungs every 6 (six) hours as needed for wheezing or  shortness of breath. 18 g 2  . amLODipine (NORVASC) 10 MG tablet Take 1 tablet (10 mg total) by mouth daily. 30 tablet 6  . Ascorbic Acid (VITAMIN C PO) Take 1 tablet by mouth daily.    Marland Kitchen atorvastatin (LIPITOR) 10 MG tablet Take 1 tablet (10 mg total) by mouth daily. 90 tablet 3  . baclofen (LIORESAL) 10 MG tablet Take 0.5-1 tablets (5-10 mg total) by mouth at bedtime as needed for muscle spasms. 30 each 3  . betamethasone dipropionate 0.05 % cream Apply topically 2 (two) times daily. 15 g 0  . celecoxib (CELEBREX) 200 MG capsule Take 1 capsule  (200 mg total) by mouth 2 (two) times daily as needed. 60 capsule 6  . cetirizine (ZYRTEC) 10 MG tablet Take 1 tablet (10 mg total) by mouth daily. 30 tablet 11  . Cyanocobalamin (VITAMIN B-12 PO) Take 1 tablet by mouth daily.    . Diclofenac Sodium 3 % GEL Apply 1 each topically 2 (two) times daily. 100 g 3  . fluticasone (FLONASE) 50 MCG/ACT nasal spray Place 2 sprays into both nostrils daily. 16 g 2  . gabapentin (NEURONTIN) 300 MG capsule Take 3 capsules (900 mg total) by mouth at bedtime. 90 capsule 3  . hydrochlorothiazide (HYDRODIURIL) 25 MG tablet Take 1 tablet (25 mg total) by mouth daily. 30 tablet 6  . losartan (COZAAR) 50 MG tablet Take 1 tablet (50 mg total) by mouth daily. 30 tablet 3  . methocarbamol (ROBAXIN) 500 MG tablet Take 2 tablets (1,000 mg total) by mouth 4 (four) times daily. 90 tablet 0  . mometasone-formoterol (DULERA) 200-5 MCG/ACT AERO Inhale 2 puffs into the lungs 2 (two) times daily. 13 g 3  . Multiple Vitamin (MULTIVITAMIN) tablet Take 1 tablet by mouth daily.    . nicotine (NICODERM CQ - DOSED IN MG/24 HOURS) 14 mg/24hr patch Place 1 patch (14 mg total) onto the skin daily. 28 patch 1  . ondansetron (ZOFRAN ODT) 4 MG disintegrating tablet Take 1 tablet (4 mg total) by mouth every 8 (eight) hours as needed for nausea or vomiting. 20 tablet 0  . pantoprazole (PROTONIX) 40 MG tablet Take 1 tablet (40 mg total) by mouth daily. 30 tablet 3  . polyethylene glycol powder (GLYCOLAX/MIRALAX) 17 GM/SCOOP powder Take 17 g by mouth daily as needed for mild constipation. 3350 g 1  . potassium chloride (KLOR-CON) 10 MEQ tablet Take 1 tablet (10 mEq total) by mouth daily. 30 tablet 2  . promethazine-dextromethorphan (PROMETHAZINE-DM) 6.25-15 MG/5ML syrup Take 5 mLs by mouth 4 (four) times daily as needed for cough. (Patient not taking: Reported on 03/30/2020) 240 mL 0  . sertraline (ZOLOFT) 50 MG tablet Take 1 tablet (50 mg total) by mouth daily. 30 tablet 1  . Spacer/Aero-Holding  Chambers (AEROCHAMBER MV) inhaler Use as instructed 1 each 0  . SUMAtriptan (IMITREX) 100 MG tablet Take 1 tablet earliest onset of migraine.  May repeat in 2 hours if headache persists or recurs.  Maximum 2 tablets in 24 hours. 10 tablet 3  . topiramate (TOPAMAX) 50 MG tablet Take 1 tablet (50 mg total) by mouth at bedtime. 30 tablet 3  . traZODone (DESYREL) 50 MG tablet Take 1 tablet (50 mg total) by mouth at bedtime as needed for sleep. 30 tablet 3  . triamcinolone cream (KENALOG) 0.1 % Apply 1 application topically 2 (two) times daily. 30 g 2   No current facility-administered medications on file prior to visit.    Allergies  Allergen Reactions  . Diphenhydramine Hcl Itching  . Metoprolol Palpitations    Social History   Socioeconomic History  . Marital status: Legally Separated    Spouse name: Not on file  . Number of children: Not on file  . Years of education: Not on file  . Highest education level: Not on file  Occupational History  . Not on file  Tobacco Use  . Smoking status: Current Every Day Smoker    Packs/day: 0.25    Years: 32.00    Pack years: 8.00    Types: Cigarettes  . Smokeless tobacco: Never Used  Vaping Use  . Vaping Use: Never used  Substance and Sexual Activity  . Alcohol use: Not Currently  . Drug use: Not Currently    Types: Marijuana    Comment: occasional  marijuana--  hx  "crack" use  last used 2004--  no treatment rehab (done on her own)  . Sexual activity: Not on file  Other Topics Concern  . Not on file  Social History Narrative  . Not on file   Social Determinants of Health   Financial Resource Strain:   . Difficulty of Paying Living Expenses: Not on file  Food Insecurity:   . Worried About Programme researcher, broadcasting/film/video in the Last Year: Not on file  . Ran Out of Food in the Last Year: Not on file  Transportation Needs:   . Lack of Transportation (Medical): Not on file  . Lack of Transportation (Non-Medical): Not on file  Physical  Activity:   . Days of Exercise per Week: Not on file  . Minutes of Exercise per Session: Not on file  Stress:   . Feeling of Stress : Not on file  Social Connections:   . Frequency of Communication with Friends and Family: Not on file  . Frequency of Social Gatherings with Friends and Family: Not on file  . Attends Religious Services: Not on file  . Active Member of Clubs or Organizations: Not on file  . Attends Banker Meetings: Not on file  . Marital Status: Not on file  Intimate Partner Violence:   . Fear of Current or Ex-Partner: Not on file  . Emotionally Abused: Not on file  . Physically Abused: Not on file  . Sexually Abused: Not on file    Family History  Problem Relation Age of Onset  . Hypertension Father   . Breast cancer Maternal Aunt     Past Surgical History:  Procedure Laterality Date  . ABDOMINAL HYSTERECTOMY    . BUNIONECTOMY Left 2011  . HEEL SPUR SURGERY Right 2012  . KNEE ARTHROSCOPY WITH ANTERIOR CRUCIATE LIGAMENT (ACL) REPAIR WITH HAMSTRING GRAFT Right 01/31/2014   Procedure: RIGHT KNEE ARTHROSCOPY WITH ALLOGRAFT (ACL) ANTERIOR CRUCIATE LIGAMENT RECONSTRUCTON PARTIAL MENISCECTOMY VERSES REPAIR ;  Surgeon: Eugenia Mcalpine, MD;  Location: [facility];  Service: Orthopedics;  Laterality: Right;  . TOTAL ABDOMINAL HYSTERECTOMY W/ BILATERAL SALPINGOOPHORECTOMY  04-20-2006    ROS: Review of Systems Negative except as stated above  PHYSICAL EXAM: BP (!) 155/101   Pulse 98   Temp 98.2 F (36.8 C)   Resp 16   Wt 190 lb 3.2 oz (86.3 kg)   SpO2 99%   BMI 32.65 kg/m   Physical Exam  General appearance - alert, well appearing, and in no distress Mental status -patient very tearful when talking about her daughters.   Neck - supple, no significant adenopathy Chest - clear to auscultation, no wheezes,  rales or rhonchi, symmetric air entry Heart - normal rate, regular rhythm, normal S1, S2, no murmurs, rubs, clicks or  gallops Extremities - peripheral pulses normal, no pedal edema, no clubbing or cyanosis On leap exam, she has decreased sensation on the plantar surface of both feet. Skin: She has peeling around the perimeter and soles of both feet.  CMP Latest Ref Rng & Units 12/19/2019 12/16/2019 07/14/2019  Glucose 70 - 99 mg/dL 95 78 95  BUN 6 - 20 mg/dL Creatinine 0.44 - 1.00 mg/dL 4.09 8.11 9.14  Sodium 135 - 145 mmol/L 137 142 141  Potassium 3.5 - 5.1 mmol/L 4.5 4.7 3.7  Chloride 98 - 111 mmol/L 102 105 104  CO2 22 - 32 mmol/L Calcium 8.9 - 10.3 mg/dL 9.4 9.5 9.5  Total Protein 6.0 - 8.5 g/dL - 7.6 -  Total Bilirubin 0.0 - 1.2 mg/dL - 0.2 -  Alkaline Phos 39 - 117 IU/L - 73 -  AST 0 - 40 IU/L - 23 -  ALT 0 - 32 IU/L - 17 -   Lipid Panel     Component Value Date/Time   CHOL 232 (H) 12/16/2019 1204   TRIG 195 (H) 12/16/2019 1204   HDL 44 12/16/2019 1204   CHOLHDL 5.3 (H) 12/16/2019 1204   CHOLHDL 4.5 Ratio 04/23/2007 2021   VLDL 39 04/23/2007 2021   LDLCALC 152 (H) 12/16/2019 1204    CBC    Component Value Date/Time   WBC 6.1 12/16/2019 1204   WBC 9.0 07/14/2019 1051   RBC 4.23 12/16/2019 1204   RBC 4.22 07/14/2019 1051   HGB 13.0 12/16/2019 1204   HCT 37.9 12/16/2019 1204   PLT 363 12/16/2019 1204   MCV 90 12/16/2019 1204   MCH 30.7 12/16/2019 1204   MCH 29.9 07/14/2019 1051   MCHC 34.3 12/16/2019 1204   MCHC 32.2 07/14/2019 1051   RDW 12.9 12/16/2019 1204   LYMPHSABS 2.4 07/14/2019 1051   MONOABS 0.7 07/14/2019 1051   EOSABS 0.0 07/14/2019 1051   BASOSABS 0.0 07/14/2019 1051   Depression screen [facility] 2/9 03/30/2020 01/20/2020 12/30/2019  Decreased Interest 0 0 0  Down, Depressed, Hopeless 2 0 0  PHQ - 2 Score 2 0 0  Altered sleeping 2 0 3  Tired, decreased energy Change in appetite 0 0 0  Feeling bad or failure about yourself  0 0 0  Trouble concentrating 0 0 0  Moving slowly or fidgety/restless 0 0 0  Suicidal thoughts 0 - 0  PHQ-9 Score Difficult doing work/chores - - -  Some recent data might be hidden    ASSESSMENT AND PLAN: 1. Moderate episode of recurrent major depressive disorder (HCC) She is agreeable to setting up an appointment to see the LCSW for some counseling. She is agreeable to starting a low-dose of Cymbalta.  Advised patient that it can take about 4 to 6 weeks for she start feeling better on the medication.  Follow-up if any suicidal ideation.  I will have her come back in 6 weeks. - DULoxetine (CYMBALTA) 20 MG capsule; Take 1 capsule (20 mg total) by mouth daily.  Dispense: 30 capsule; Refill: 3  2. Alcohol use disorder, moderate, dependence (HCC) Strongly advised her not to go down the slippery slope of alcohol abuse again.  We talked about perhaps trying naltrexone to decrease cravings for alcohol and she is agreeable to that.  We  will check LFTs first.  If they are normal we will prescribe the naltrexone.  She is not on any narcotic medications nor purchasing narcotic medicines on the street.  Advised her that if she does she should not take naltrexone as it can cause significant withdrawal symptoms. - Hepatic Function Panel  3. Essential hypertension Not at goal.  She has not taken medicines as yet for today.  Advised to take them when she returns home.  4. Neuropathy of both feet Check B12 level and A1c.  Symptoms can also be due to alcohol.  She is on gabapentin.  We have just started Cymbalta to help with depression.  Advised patient that it can also help with neuropathy symptoms. - Hemoglobin A1c; Future - Vitamin B12 - Hemoglobin A1c  5. Tinea pedis of both feet Recommend over-the-counter Lamisil cream.  6. Influenza vaccination declined This was offered and patient declined.  7. Educated about COVID-19 virus infection Encourage patient to get the COVID-19 vaccine.  Informed that the vaccines are relatively safe and effective.  She plans to do so.   Patient was given the opportunity to  ask questions.  Patient verbalized understanding of the plan and was able to repeat key elements of the plan.   No orders of the defined types were placed in this encounter.    Requested Prescriptions    No prescriptions requested or ordered in this encounter    No follow-ups on file.  Jonah Blue, MD, FACP

## 2020-03-30 NOTE — Patient Instructions (Addendum)
Purchase Lamisil cream over-the-counter and use it on your feet twice a day.  We have started you on a medication called Cymbalta 20 mg daily.  This medication is for depression.  It will also help with decreasing the pain in your feet.  Please stop at the front desk and make an appointment with our licensed clinical social worker for counseling.  We will check your liver function test today.  If they are normal, I will prescribe with a medication called naltrexone for you to take daily to help decrease the craving for alcohol.  Your blood pressure is elevated today.  Please remember to take your medicines when you return home.

## 2020-03-31 LAB — HEPATIC FUNCTION PANEL
ALT: 21 IU/L (ref 0–32)
AST: 28 IU/L (ref 0–40)
Albumin: 4.3 g/dL (ref 3.8–4.9)
Alkaline Phosphatase: 85 IU/L (ref 48–121)
Bilirubin Total: <0.2 mg/dL (ref 0.0–1.2)
Bilirubin, Direct: 0.07 mg/dL (ref 0.00–0.40)
Total Protein: 7.8 g/dL (ref 6.0–8.5)

## 2020-03-31 LAB — VITAMIN B12: Vitamin B-12: 517 pg/mL (ref 232–1245)

## 2020-03-31 LAB — HEMOGLOBIN A1C
Est. average glucose Bld gHb Est-mCnc: 123 mg/dL
Hgb A1c MFr Bld: 5.9 % — ABNORMAL HIGH (ref 4.8–5.6)

## 2020-04-01 ENCOUNTER — Ambulatory Visit (HOSPITAL_BASED_OUTPATIENT_CLINIC_OR_DEPARTMENT_OTHER): Payer: Medicaid Other | Admitting: Internal Medicine

## 2020-04-01 ENCOUNTER — Other Ambulatory Visit: Payer: Self-pay | Admitting: Internal Medicine

## 2020-04-01 DIAGNOSIS — G4733 Obstructive sleep apnea (adult) (pediatric): Secondary | ICD-10-CM | POA: Insufficient documentation

## 2020-04-01 MED ORDER — NALTREXONE HCL 50 MG PO TABS: 50.0000 mg | ORAL_TABLET | Freq: Every day | ORAL | 0 refills | Status: DC

## 2020-04-01 MED FILL — NALTREXONE 50 MG TABLET: 50 | 30 days supply | Qty: 30 | Fill #0

## 2020-04-03 ENCOUNTER — Telehealth: Payer: Self-pay | Admitting: *Deleted

## 2020-04-03 ENCOUNTER — Other Ambulatory Visit: Payer: Self-pay | Admitting: Pharmacist

## 2020-04-03 MED ORDER — NALTREXONE HCL 50 MG PO TABS: 50.0000 mg | ORAL_TABLET | Freq: Every day | ORAL | 0 refills | Status: DC

## 2020-04-03 NOTE — Telephone Encounter (Signed)
Patient requested her medications to go to [facility] on [address].

## 2020-04-05 ENCOUNTER — Other Ambulatory Visit: Payer: Self-pay

## 2020-04-05 NOTE — Telephone Encounter (Signed)
Contacted pt to see what medications she is needing sent to [facility]. Lvm

## 2020-04-15 ENCOUNTER — Other Ambulatory Visit: Payer: Self-pay

## 2020-04-15 ENCOUNTER — Ambulatory Visit: Payer: Medicaid Other | Attending: Family Medicine | Admitting: Licensed Clinical Social Worker

## 2020-04-15 ENCOUNTER — Other Ambulatory Visit: Payer: Self-pay | Admitting: Physician Assistant

## 2020-04-15 DIAGNOSIS — F1020 Alcohol dependence, uncomplicated: Secondary | ICD-10-CM

## 2020-04-15 DIAGNOSIS — F331 Major depressive disorder, recurrent, moderate: Secondary | ICD-10-CM

## 2020-04-15 DIAGNOSIS — B353 Tinea pedis: Secondary | ICD-10-CM

## 2020-04-15 DIAGNOSIS — M79673 Pain in unspecified foot: Secondary | ICD-10-CM

## 2020-04-15 DIAGNOSIS — G5793 Unspecified mononeuropathy of bilateral lower limbs: Secondary | ICD-10-CM

## 2020-04-19 ENCOUNTER — Ambulatory Visit (HOSPITAL_BASED_OUTPATIENT_CLINIC_OR_DEPARTMENT_OTHER): Payer: Medicaid Other | Attending: Internal Medicine | Admitting: Internal Medicine

## 2020-04-27 ENCOUNTER — Telehealth: Payer: Self-pay

## 2020-04-27 NOTE — Telephone Encounter (Signed)
Pt will be switched to Dr. Laural Benes schedule  Will switch Laural Benes and Amy 10's

## 2020-04-27 NOTE — Telephone Encounter (Signed)
Pt contacted the office and stated her legs has been bothering her. Pt states she doesn't know what is going on.   Pt is requesting Tramadol   Pt is scheduled an appt with Amy for tomorrow at 10am.

## 2020-04-28 ENCOUNTER — Encounter: Payer: Self-pay | Admitting: Internal Medicine

## 2020-04-28 ENCOUNTER — Other Ambulatory Visit: Payer: Self-pay

## 2020-04-28 ENCOUNTER — Ambulatory Visit: Payer: Medicaid Other | Attending: Family | Admitting: Internal Medicine

## 2020-04-28 VITALS — BP 145/98 | HR 83 | Temp 98.2°F | Resp 16 | Wt 190.4 lb

## 2020-04-28 DIAGNOSIS — M25561 Pain in right knee: Secondary | ICD-10-CM

## 2020-04-28 DIAGNOSIS — J4541 Moderate persistent asthma with (acute) exacerbation: Secondary | ICD-10-CM

## 2020-04-28 DIAGNOSIS — M25562 Pain in left knee: Secondary | ICD-10-CM

## 2020-04-28 DIAGNOSIS — F331 Major depressive disorder, recurrent, moderate: Secondary | ICD-10-CM

## 2020-04-28 MED ORDER — DULOXETINE HCL 30 MG PO CPEP: 30.0000 mg | ORAL_CAPSULE | Freq: Every day | ORAL | 3 refills | Status: DC

## 2020-04-28 MED ORDER — TRAMADOL HCL 50 MG PO TABS: 50.0000 mg | ORAL_TABLET | Freq: Three times a day (TID) | ORAL | 0 refills | Status: DC | PRN

## 2020-04-28 MED ORDER — ALBUTEROL SULFATE HFA 108 (90 BASE) MCG/ACT IN AERS: 2.0000 | INHALATION_SPRAY | Freq: Four times a day (QID) | RESPIRATORY_TRACT | 2 refills | Status: AC | PRN

## 2020-04-28 MED ORDER — PREDNISONE 20 MG PO TABS: 20.0000 mg | ORAL_TABLET | Freq: Every day | ORAL | 0 refills | Status: DC

## 2020-04-28 MED ORDER — CELECOXIB 200 MG PO CAPS: 200.0000 mg | ORAL_CAPSULE | Freq: Every day | ORAL | 6 refills | Status: DC

## 2020-04-28 MED FILL — traMADol HCL 50 MG TABS: 50 | 6 days supply | Qty: 20 | Fill #0

## 2020-04-28 MED FILL — ?PREDNISONE 20MG TABLET: 20 | 3 days supply | Qty: 3 | Fill #0

## 2020-04-28 MED FILL — DULoxetine HCL 30 MG CPEP: 30 | 30 days supply | Qty: 30 | Fill #0

## 2020-04-28 MED FILL — CELECOXIB 200 MG CAP: 200 | 30 days supply | Qty: 30 | Fill #0

## 2020-04-28 MED FILL — ALBUTEROL SULFATE HFA 108 (: 108 (90 BAS | 25 days supply | Qty: 18 | Fill #0

## 2020-04-28 NOTE — Progress Notes (Signed)
Pt is requesting tramadol  Pt states the Cymbalta works on and off and the gabapentin is not hardly working   Pt states she is also having b/l knee pain

## 2020-04-28 NOTE — Progress Notes (Signed)
Patient ID: [name] RippleStephanie L [name], female    DOB: 1965-12-11  MRN: [id number]  CC: Leg Pain   Subjective: [name] [name] is a 54 y.o. female who presents for UC visit Her concerns today include:  Patient with history of HTN, tobdep, bipolar disorder, HL, chronic LBPwhich she attributes to arthritis, ETOHabusein remission, ACD, marijuanain remission, GERD, moderate persistent asthma, COVID infection 05/2019  Patient thinks she has a slight cold.  Reports cough productive of yellow phlegm for a few days.   Some SOB and wheezing No fever, sore throat, loss taste or smell. [facility] because she is out of her Albuterol.  Cannot afford the albuterol at an outside pharmacy.  She tells me that she owes our pharmacy money so they will not allow her to fill prescriptions until she pays off her debt Still thinking about whether she will take COVID vaccine.  Had COVID infection 05/2019.  Also complained of pain in both knees since last wk. RT knee stays swollen.  She has had surgery in the past on the right knee for contusion after she fell down some steps.  The pain in the knees is described as sharp.  She also reports sharp pain in her legs all the time.  Reports pain from both mid calf up to about the mid thigh posteriorly.  She feels Cymbalta helps takes the edge off the pain.  She is on gabapentin and states that she does not find that helpful.  Celebrex was prescribed for her by [name] [name] earlier this year but she thinks she may be out of it.  She is requesting some tramadol to help with the knee pain.  Depression: On last visit with me about a month ago, she was assessed to have moderate depression related to stress of dealing with her daughters whom she was trying to reestablish relationships with.  She was started on Cymbalta and advised to follow-up with our LCSW. - "I love my depression medicine.  It's working."  Never got to have meeting with daughters.  "I feel I'm going to  be okay."  I do not see a note on the chart however she tells me that she did see our LCSW [name] [name] -did not get Naltrexone as yet.  Cost $28.  She has been able to cut back.  No beer since last wk.  Not buying liquor any more.  "I don't think about it like I use to."   HTN: Blood pressure noted to be elevated today.  She has not taken medicines as yet for the morning. Patient Active Problem List   Diagnosis Date Noted  . OSA (obstructive sleep apnea) 04/01/2020  . Moderate persistent asthma with acute exacerbation 12/30/2019  . Chronic coughing 12/30/2019  . Seasonal allergies 12/30/2019  . Loud snoring 12/16/2019  . Prediabetes 01/04/2019  . Obesity (BMI 30-39.9) 01/04/2019  . Ganglion cyst 09/28/2018  . Carpal tunnel syndrome of right wrist 09/28/2018  . Chronic midline low back pain without sciatica 11/23/2017  . Essential hypertension 11/23/2017  . Chronic pain of right knee 11/23/2017  . 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 Marijuana abuse 11/23/2017  . Tobacco dependence 11/23/2017  . Alcohol abuse 07/30/2016  . S/P ACL reconstruction 01/31/2014  . DEPRESSION 11/17/2008  . ALLERGIC RHINITIS 11/17/2008  . GERD 11/17/2008     Current Outpatient Medications on File Prior to Visit  Medication Sig Dispense Refill  . acetaminophen (TYLENOL) 500 MG tablet Take 1 tablet (500 mg  total) by mouth every 6 (six) hours as needed for up to 30 doses. 30 tablet 0  . albuterol (PROVENTIL) (2.5 MG/3ML) 0.083% nebulizer solution Take 3 mLs (2.5 mg total) by nebulization every 6 (six) hours as needed for wheezing or shortness of breath. 150 mL 1  . amLODipine (NORVASC) 10 MG tablet Take 1 tablet (10 mg total) by mouth daily. 30 tablet 6  . Ascorbic Acid (VITAMIN C PO) Take 1 tablet by mouth daily.    Marland Kitchen atorvastatin (LIPITOR) 10 MG tablet Take 1 tablet (10 mg total) by mouth daily. 90 tablet 3  . baclofen (LIORESAL) 10 MG tablet Take 0.5-1 tablets (5-10 mg total) by mouth at bedtime  as needed for muscle spasms. 30 each 3  . betamethasone dipropionate 0.05 % cream Apply topically 2 (two) times daily. 15 g 0  . cetirizine (ZYRTEC) 10 MG tablet Take 1 tablet (10 mg total) by mouth daily. 30 tablet 11  . Cyanocobalamin (VITAMIN B-12 PO) Take 1 tablet by mouth daily.    . Diclofenac Sodium 3 % GEL Apply 1 each topically 2 (two) times daily. 100 g 3  . fluticasone (FLONASE) 50 MCG/ACT nasal spray Place 2 sprays into both nostrils daily. 16 g 2  . gabapentin (NEURONTIN) 300 MG capsule Take 3 capsules (900 mg total) by mouth at bedtime. 90 capsule 3  . hydrochlorothiazide (HYDRODIURIL) 25 MG tablet Take 1 tablet (25 mg total) by mouth daily. 30 tablet 6  . losartan (COZAAR) 50 MG tablet Take 1 tablet (50 mg total) by mouth daily. 30 tablet 3  . methocarbamol (ROBAXIN) 500 MG tablet Take 2 tablets (1,000 mg total) by mouth 4 (four) times daily. 90 tablet 0  . mometasone-formoterol (DULERA) 200-5 MCG/ACT AERO Inhale 2 puffs into the lungs 2 (two) times daily. 13 g 3  . Multiple Vitamin (MULTIVITAMIN) tablet Take 1 tablet by mouth daily.    . naltrexone (DEPADE) 50 MG tablet Take 1 tablet (50 mg total) by mouth daily. 30 tablet 0  . nicotine (NICODERM CQ - DOSED IN MG/24 HOURS) 14 mg/24hr patch Place 1 patch (14 mg total) onto the skin daily. 28 patch 1  . ondansetron (ZOFRAN ODT) 4 MG disintegrating tablet Take 1 tablet (4 mg total) by mouth every 8 (eight) hours as needed for nausea or vomiting. 20 tablet 0  . pantoprazole (PROTONIX) 40 MG tablet Take 1 tablet (40 mg total) by mouth daily. 30 tablet 3  . polyethylene glycol powder (GLYCOLAX/MIRALAX) 17 GM/SCOOP powder Take 17 g by mouth daily as needed for mild constipation. 3350 g 1  . potassium chloride (KLOR-CON) 10 MEQ tablet Take 1 tablet (10 mEq total) by mouth daily. 30 tablet 2  . promethazine-dextromethorphan (PROMETHAZINE-DM) 6.25-15 MG/5ML syrup Take 5 mLs by mouth 4 (four) times daily as needed for cough. (Patient not  taking: Reported on 03/30/2020) 240 mL 0  . Spacer/Aero-Holding Chambers (AEROCHAMBER MV) inhaler Use as instructed 1 each 0  . SUMAtriptan (IMITREX) 100 MG tablet Take 1 tablet earliest onset of migraine.  May repeat in 2 hours if headache persists or recurs.  Maximum 2 tablets in 24 hours. 10 tablet 3  . topiramate (TOPAMAX) 50 MG tablet Take 1 tablet (50 mg total) by mouth at bedtime. 30 tablet 3  . traZODone (DESYREL) 50 MG tablet Take 1 tablet (50 mg total) by mouth at bedtime as needed for sleep. 30 tablet 3  . triamcinolone cream (KENALOG) 0.1 % Apply 1 application topically 2 (two) times  daily. 30 g 2   No current facility-administered medications on file prior to visit.    Allergies  Allergen Reactions  . Diphenhydramine Hcl Itching  . Metoprolol Palpitations    Social History   Socioeconomic History  . Marital status: Legally Separated    Spouse name: Not on file  . Number of children: Not on file  . Years of education: Not on file  . Highest education level: Not on file  Occupational History  . Not on file  Tobacco Use  . Smoking status: Current Every Day Smoker    Packs/day: 0.25    Years: 32.00    Pack years: 8.00    Types: Cigarettes  . Smokeless tobacco: Never Used  Vaping Use  . Vaping Use: Never used  Substance and Sexual Activity  . Alcohol use: Not Currently  . Drug use: Not Currently    Types: Marijuana    Comment: occasional  marijuana--  hx  "crack" use  last used 2004--  no treatment rehab (done on her own)  . Sexual activity: Not on file  Other Topics Concern  . Not on file  Social History Narrative  . Not on file   Social Determinants of Health   Financial Resource Strain:   . Difficulty of Paying Living Expenses: Not on file  Food Insecurity:   . Worried About Programme researcher, broadcasting/film/video in the Last Year: Not on file  . Ran Out of Food in the Last Year: Not on file  Transportation Needs:   . Lack of Transportation (Medical): Not on file  . Lack  of Transportation (Non-Medical): Not on file  Physical Activity:   . Days of Exercise per Week: Not on file  . Minutes of Exercise per Session: Not on file  Stress:   . Feeling of Stress : Not on file  Social Connections:   . Frequency of Communication with Friends and Family: Not on file  . Frequency of Social Gatherings with Friends and Family: Not on file  . Attends Religious Services: Not on file  . Active Member of Clubs or Organizations: Not on file  . Attends Banker Meetings: Not on file  . Marital Status: Not on file  Intimate Partner Violence:   . Fear of Current or Ex-Partner: Not on file  . Emotionally Abused: Not on file  . Physically Abused: Not on file  . Sexually Abused: Not on file    Family History  Problem Relation Age of Onset  . Hypertension Father   . Breast cancer Maternal Aunt     Past Surgical History:  Procedure Laterality Date  . ABDOMINAL HYSTERECTOMY    . BUNIONECTOMY Left 2011  . HEEL SPUR SURGERY Right 2012  . KNEE ARTHROSCOPY WITH ANTERIOR CRUCIATE LIGAMENT (ACL) REPAIR WITH HAMSTRING GRAFT Right 01/31/2014   Procedure: RIGHT KNEE ARTHROSCOPY WITH ALLOGRAFT (ACL) ANTERIOR CRUCIATE LIGAMENT RECONSTRUCTON PARTIAL MENISCECTOMY VERSES REPAIR ;  Surgeon: Eugenia Mcalpine, MD;  Location: [facility] [place];  Service: Orthopedics;  Laterality: Right;  . TOTAL ABDOMINAL HYSTERECTOMY W/ BILATERAL SALPINGOOPHORECTOMY  04-20-2006    ROS: Review of Systems Negative except as stated above  PHYSICAL EXAM: BP (!) 145/98   Pulse 83   Temp 98.2 F (36.8 C)   Resp 16   Wt 190 lb 6.4 oz (86.4 kg)   SpO2 98%   BMI 32.68 kg/m   Physical Exam  General appearance - alert, well appearing, and in no distress Mental status - normal mood,  behavior, speech, dress, motor activity, and thought processes Chest -mild scattered wheezing.  Good air entry.  She is able to talk in full sentences.  She is not audibly congested. Heart - normal rate,  regular rhythm, normal S1, S2, no murmurs, rubs, clicks or gallops Musculoskeletal -she is limping a little bit when she walks.  Knees: Mild enlargement of the right knee.  Mild tenderness on palpation of the lower edge of the patella.  No crepitus on passive range of motion.  Mild discomfort with passive range of motion.  Left knee: The joint is not enlarged.  There is no point tenderness on the medial or lateral joint line.  Good range of motion.   Extremities -no edema of the lower legs.  CMP Latest Ref Rng & Units 03/30/2020 12/19/2019 12/16/2019  Glucose 70 - 99 mg/dL - 95 78  BUN 6 - 20 mg/dL - 11 12  Creatinine 3.14 - 1.00 mg/dL - 9.70 2.63  Sodium 785 - 145 mmol/L - 137 142  Potassium 3.5 - 5.1 mmol/L - 4.5 4.7  Chloride 98 - 111 mmol/L - 102 105  CO2 22 - 32 mmol/L - 25 24  Calcium 8.9 - 10.3 mg/dL - 9.4 9.5  Total Protein 6.0 - 8.5 g/dL 7.8 - 7.6  Total Bilirubin 0.0 - 1.2 mg/dL <8.8 - 0.2  Alkaline Phos 48 - 121 IU/L 85 - 73  AST 0 - 40 IU/L 28 - 23  ALT 0 - 32 IU/L 21 - 17   Lipid Panel     Component Value Date/Time   CHOL 232 (H) 12/16/2019 1204   TRIG 195 (H) 12/16/2019 1204   HDL 44 12/16/2019 1204   CHOLHDL 5.3 (H) 12/16/2019 1204   CHOLHDL 4.5 Ratio 04/23/2007 2021   VLDL 39 04/23/2007 2021   LDLCALC 152 (H) 12/16/2019 1204    CBC    Component Value Date/Time   WBC 6.1 12/16/2019 1204   WBC 9.0 07/14/2019 1051   RBC 4.23 12/16/2019 1204   RBC 4.22 07/14/2019 1051   HGB 13.0 12/16/2019 1204   HCT 37.9 12/16/2019 1204   PLT 363 12/16/2019 1204   MCV 90 12/16/2019 1204   MCH 30.7 12/16/2019 1204   MCH 29.9 07/14/2019 1051   MCHC 34.3 12/16/2019 1204   MCHC 32.2 07/14/2019 1051   RDW 12.9 12/16/2019 1204   LYMPHSABS 2.4 07/14/2019 1051   MONOABS 0.7 07/14/2019 1051   EOSABS 0.0 07/14/2019 1051   BASOSABS 0.0 07/14/2019 1051    ASSESSMENT AND PLAN: 1. Moderate persistent asthma with acute exacerbation Advised patient to take [facility] twice a day as  prescribed.  Explained to her the difference between maintenance inhaler and rescue inhaler.  Refill given on albuterol. - albuterol (VENTOLIN HFA) 108 (90 Base) MCG/ACT inhaler; Inhale 2 puffs into the lungs every 6 (six) hours as needed for wheezing or shortness of breath.  Dispense: 18 g; Refill: 2 - predniSONE (DELTASONE) 20 MG tablet; Take 1 tablet (20 mg total) by mouth daily with breakfast.  Dispense: 3 tablet; Refill: 0  2. Moderate episode of recurrent major depressive disorder (HCC) Doing better on Cymbalta.  She requests an increased dose.  We will increase it to 30 mg daily. -Continue to encourage her to try to abstain from alcohol completely. - DULoxetine (CYMBALTA) 30 MG capsule; Take 1 capsule (30 mg total) by mouth daily.  Dispense: 30 capsule; Refill: 3  3. Acute pain of both knees Likely has mild OA in the right  knee due to previous trauma to the knee.  Refill given on Celebrex.  I have given a limited supply of tramadol to use as needed.  Advised that should she decide to fill the prescription for naltrexone, she not take it while she is taking tramadol. - celecoxib (CELEBREX) 200 MG capsule; Take 1 capsule (200 mg total) by mouth daily.  Dispense: 30 capsule; Refill: 6 - traMADol (ULTRAM) 50 MG tablet; Take 1 tablet (50 mg total) by mouth every 8 (eight) hours as needed.  Dispense: 20 tablet; Refill: 0    Patient was given the opportunity to ask questions.  Patient verbalized understanding of the plan and was able to repeat key elements of the plan.   No orders of the defined types were placed in this encounter.    Requested Prescriptions   Signed Prescriptions Disp Refills  . celecoxib (CELEBREX) 200 MG capsule 30 capsule 6    Sig: Take 1 capsule (200 mg total) by mouth daily.  Marland Kitchen albuterol (VENTOLIN HFA) 108 (90 Base) MCG/ACT inhaler 18 g 2    Sig: Inhale 2 puffs into the lungs every 6 (six) hours as needed for wheezing or shortness of breath.  . DULoxetine  (CYMBALTA) 30 MG capsule 30 capsule 3    Sig: Take 1 capsule (30 mg total) by mouth daily.  . traMADol (ULTRAM) 50 MG tablet 20 tablet 0    Sig: Take 1 tablet (50 mg total) by mouth every 8 (eight) hours as needed.  . predniSONE (DELTASONE) 20 MG tablet 3 tablet 0    Sig: Take 1 tablet (20 mg total) by mouth daily with breakfast.    No follow-ups on file.  Jonah Blue, MD, FACP

## 2020-04-28 NOTE — Patient Instructions (Signed)
I have sent the prescription to our pharmacy for albuterol inhaler and prednisone.  Increase Cymbalta to 30 mg daily as discussed today.  I have refilled Celebrex which is arthritis medication for your joints. I have given a limited supply of tramadol.  You should not take naltrexone which is the medication to help decrease alcohol cravings while on tramadol.

## 2020-05-05 NOTE — BH Specialist Note (Signed)
Integrated Behavioral Health Initial Visit  MRN: [id number] Name: [name] [name]  Number of Integrated Behavioral Health Clinician visits:: 1/6 Session Start time: 11:15 AM  Session End time: 11:40 AM Total time: 25  Type of Service: Integrated Behavioral Health- Individual Interpretor:No. Interpretor Name and Language: NA   SUBJECTIVE: DENEISHA [name] is a 54 y.o. female accompanied by self Patient was referred by Dr. Laural Benes for depression and substance use. Patient reports the following symptoms/concerns: Pt reports difficulty managing increase in depression and anxiety symptoms triggered by family strain, chronic pain, and psychosocial stressors Duration of problem: Ongoing; Severity of problem: severe  OBJECTIVE: Mood: Anxious and Affect: Depressed Risk of harm to self or others: No plan to harm self or others  LIFE CONTEXT: Family and Social: Pt receives strong support from family. Aunt provides transportation and sister, father, and adult son provide emotional support School/Work: Pt has a pending disability case, hearing was July 14th. Pt has obtained a lawyer to assist Self-Care: Pt is participating in medication management through PCP. Reports drinking alcohol to cope with stressors Life Changes: Pt has difficulty managing mental and physical health conditions. Receives limited support from adult daughters  GOALS ADDRESSED: Patient will: 1. Increase knowledge and/or ability of: coping skills playing games on phone and connecting to family through social media  2. Demonstrate ability to: Decrease self-medicating behaviors decrease alcohol use on daily basis  INTERVENTIONS: Interventions utilized: Solution-Focused Strategies, Supportive Counseling and Psychoeducation and/or Health Education  Standardized Assessments completed: Not Needed  ASSESSMENT: Patient currently experiencing difficulty managing increase in depression and anxiety symptoms triggered by chronic  pain, family strain, and psychosocial stressors.   Patient may benefit from continued medication management. LCSW discussed correlation between one's physical and mental health, in addition, to how alcohol use can negatively impact health. Healthy coping skills identified.  PLAN: 1. Follow up with behavioral health clinician on : Schedule follow up appointment to address any additional behavioral health and/or resource needs 2. Behavioral recommendations: Utilize strategies discussed and continue with medication management 3. Referral(s): Integrated Behavioral Health Services (In Clinic) 4. "From scale of 1-10, how likely are you to follow plan?":   Bridgett Larsson, LCSW 05/05/2020 11:20 AM

## 2020-05-14 ENCOUNTER — Ambulatory Visit: Payer: No Typology Code available for payment source | Admitting: Sports Medicine

## 2020-05-14 ENCOUNTER — Encounter (INDEPENDENT_AMBULATORY_CARE_PROVIDER_SITE_OTHER): Payer: Self-pay | Admitting: Primary Care

## 2020-05-14 ENCOUNTER — Ambulatory Visit (INDEPENDENT_AMBULATORY_CARE_PROVIDER_SITE_OTHER): Payer: Self-pay | Admitting: Primary Care

## 2020-05-14 ENCOUNTER — Encounter: Payer: Self-pay | Admitting: Sports Medicine

## 2020-05-14 ENCOUNTER — Other Ambulatory Visit: Payer: Self-pay

## 2020-05-14 VITALS — BP 142/99 | HR 85 | Temp 97.5°F | Ht 64.0 in | Wt 189.0 lb

## 2020-05-14 DIAGNOSIS — R52 Pain, unspecified: Secondary | ICD-10-CM

## 2020-05-14 DIAGNOSIS — G8929 Other chronic pain: Secondary | ICD-10-CM

## 2020-05-14 DIAGNOSIS — M7661 Achilles tendinitis, right leg: Secondary | ICD-10-CM

## 2020-05-14 DIAGNOSIS — L905 Scar conditions and fibrosis of skin: Secondary | ICD-10-CM

## 2020-05-14 DIAGNOSIS — I10 Essential (primary) hypertension: Secondary | ICD-10-CM

## 2020-05-14 DIAGNOSIS — G629 Polyneuropathy, unspecified: Secondary | ICD-10-CM

## 2020-05-14 DIAGNOSIS — M79672 Pain in left foot: Secondary | ICD-10-CM

## 2020-05-14 DIAGNOSIS — M79671 Pain in right foot: Secondary | ICD-10-CM

## 2020-05-14 DIAGNOSIS — M722 Plantar fascial fibromatosis: Secondary | ICD-10-CM

## 2020-05-14 DIAGNOSIS — M25561 Pain in right knee: Secondary | ICD-10-CM

## 2020-05-14 DIAGNOSIS — M7731 Calcaneal spur, right foot: Secondary | ICD-10-CM

## 2020-05-14 MED ORDER — PREGABALIN 75 MG PO CAPS
75.0000 mg | ORAL_CAPSULE | Freq: Two times a day (BID) | ORAL | 2 refills | Status: DC
Start: 2020-05-14 — End: 2020-06-10

## 2020-05-14 MED ORDER — HYDROCODONE-ACETAMINOPHEN 10-325 MG PO TABS: 1.0000 | ORAL_TABLET | Freq: Four times a day (QID) | ORAL | 0 refills | Status: DC | PRN

## 2020-05-14 MED ORDER — PREDNISONE 10 MG (21) PO TBPK: ORAL_TABLET | ORAL | 0 refills | Status: DC

## 2020-05-14 NOTE — Patient Instructions (Signed)

## 2020-05-14 NOTE — Progress Notes (Signed)
Acute Office Visit  Subjective:    Patient ID: [name] [name], female    DOB: [birth year], 54 y.o.   MRN: [id number]  Chief Complaint  Patient presents with  . Knee Pain  . Edema    knee    HPI [name] [name] is a 54 year old female who presents for right knee pain with swelling. Pain level 9/10 taking tylenol and a cream she rubs on it with no relief.  Recently seen here PCP complaints of the same, she has history  right knee for contusion after she fell down some steps. States it is pooping all the time unable to replicate with ROM - warm to touch and swollen.  Seen by podiatry earlier this morning and was prescribed Norco 10/25 and Lyrica 75mg  twice daily.   Past Medical History:  Diagnosis Date  . Asthma   . Bipolar 1 disorder (HCC)   . Chronic lumbar pain   . DDD (degenerative disc disease)   . Depression   . GERD (gastroesophageal reflux disease)   . History of gastric ulcer   . Hyperlipidemia   . Hypertension   . Migraine   . Osteoarthritis   . Right ACL tear   . Right knee meniscal tear   . Sciatica   . Wears glasses     Past Surgical History:  Procedure Laterality Date  . ABDOMINAL HYSTERECTOMY    . BUNIONECTOMY Left 2011  . HEEL SPUR SURGERY Right 2012  . KNEE ARTHROSCOPY WITH ANTERIOR CRUCIATE LIGAMENT (ACL) REPAIR WITH HAMSTRING GRAFT Right 01/31/2014   Procedure: RIGHT KNEE ARTHROSCOPY WITH ALLOGRAFT (ACL) ANTERIOR CRUCIATE LIGAMENT RECONSTRUCTON PARTIAL MENISCECTOMY VERSES REPAIR ;  Surgeon: 02/02/2014, MD;  Location: [facility] [place];  Service: Orthopedics;  Laterality: Right;  . TOTAL ABDOMINAL HYSTERECTOMY W/ BILATERAL SALPINGOOPHORECTOMY  04-20-2006    Family History  Problem Relation Age of Onset  . Hypertension Father   . Breast cancer Maternal Aunt     Social History   Socioeconomic History  . Marital status: Legally Separated    Spouse name: Not on file  . Number of children: Not on file  . Years of education: Not  on file  . Highest education level: Not on file  Occupational History  . Not on file  Tobacco Use  . Smoking status: Current Every Day Smoker    Packs/day: 0.25    Years: 32.00    Pack years: 8.00    Types: Cigarettes  . Smokeless tobacco: Never Used  Vaping Use  . Vaping Use: Never used  Substance and Sexual Activity  . Alcohol use: Not Currently  . Drug use: Not Currently    Types: Marijuana    Comment: occasional  marijuana--  hx  "crack" use  last used 2004--  no treatment rehab (done on her own)  . Sexual activity: Not on file  Other Topics Concern  . Not on file  Social History Narrative  . Not on file   Social Determinants of Health   Financial Resource Strain:   . Difficulty of Paying Living Expenses: Not on file  Food Insecurity:   . Worried About 2005 in the Last Year: Not on file  . Ran Out of Food in the Last Year: Not on file  Transportation Needs:   . Lack of Transportation (Medical): Not on file  . Lack of Transportation (Non-Medical): Not on file  Physical Activity:   . Days of Exercise per Week: Not on file  .  Minutes of Exercise per Session: Not on file  Stress:   . Feeling of Stress : Not on file  Social Connections:   . Frequency of Communication with Friends and Family: Not on file  . Frequency of Social Gatherings with Friends and Family: Not on file  . Attends Religious Services: Not on file  . Active Member of Clubs or Organizations: Not on file  . Attends Banker Meetings: Not on file  . Marital Status: Not on file  Intimate Partner Violence:   . Fear of Current or Ex-Partner: Not on file  . Emotionally Abused: Not on file  . Physically Abused: Not on file  . Sexually Abused: Not on file    Outpatient Medications Prior to Visit  Medication Sig Dispense Refill  . amLODipine (NORVASC) 10 MG tablet Take 1 tablet (10 mg total) by mouth daily. 30 tablet 6  . atorvastatin (LIPITOR) 10 MG tablet Take 1 tablet (10 mg  total) by mouth daily. 90 tablet 3  . baclofen (LIORESAL) 10 MG tablet Take 0.5-1 tablets (5-10 mg total) by mouth at bedtime as needed for muscle spasms. 30 each 3  . celecoxib (CELEBREX) 200 MG capsule Take 1 capsule (200 mg total) by mouth daily. 30 capsule 6  . cetirizine (ZYRTEC) 10 MG tablet Take 1 tablet (10 mg total) by mouth daily. 30 tablet 11  . DULoxetine (CYMBALTA) 30 MG capsule Take 1 capsule (30 mg total) by mouth daily. 30 capsule 3  . hydrochlorothiazide (HYDRODIURIL) 25 MG tablet Take 1 tablet (25 mg total) by mouth daily. 30 tablet 6  . HYDROcodone-acetaminophen (NORCO) 10-325 MG tablet Take 1 tablet by mouth every 6 (six) hours as needed. 30 tablet 0  . losartan (COZAAR) 50 MG tablet Take 1 tablet (50 mg total) by mouth daily. 30 tablet 3  . ondansetron (ZOFRAN ODT) 4 MG disintegrating tablet Take 1 tablet (4 mg total) by mouth every 8 (eight) hours as needed for nausea or vomiting. 20 tablet 0  . pantoprazole (PROTONIX) 40 MG tablet Take 1 tablet (40 mg total) by mouth daily. 30 tablet 3  . polyethylene glycol powder (GLYCOLAX/MIRALAX) 17 GM/SCOOP powder Take 17 g by mouth daily as needed for mild constipation. 3350 g 1  . potassium chloride (KLOR-CON) 10 MEQ tablet Take 1 tablet (10 mEq total) by mouth daily. 30 tablet 2  . SUMAtriptan (IMITREX) 100 MG tablet Take 1 tablet earliest onset of migraine.  May repeat in 2 hours if headache persists or recurs.  Maximum 2 tablets in 24 hours. 10 tablet 3  . topiramate (TOPAMAX) 25 MG tablet Take 25 mg by mouth at bedtime.    . traMADol (ULTRAM) 50 MG tablet Take 1 tablet (50 mg total) by mouth every 8 (eight) hours as needed. 20 tablet 0  . triamcinolone cream (KENALOG) 0.1 % Apply 1 application topically 2 (two) times daily. 30 g 2  . acetaminophen (TYLENOL) 500 MG tablet Take 1 tablet (500 mg total) by mouth every 6 (six) hours as needed for up to 30 doses. 30 tablet 0  . albuterol (PROVENTIL) (2.5 MG/3ML) 0.083% nebulizer solution  Take 3 mLs (2.5 mg total) by nebulization every 6 (six) hours as needed for wheezing or shortness of breath. 150 mL 1  . albuterol (VENTOLIN HFA) 108 (90 Base) MCG/ACT inhaler Inhale 2 puffs into the lungs every 6 (six) hours as needed for wheezing or shortness of breath. 18 g 2  . Ascorbic Acid (VITAMIN C PO) Take  1 tablet by mouth daily.    . betamethasone dipropionate 0.05 % cream Apply topically 2 (two) times daily. 15 g 0  . Cyanocobalamin (VITAMIN B-12 PO) Take 1 tablet by mouth daily.    . Diclofenac Sodium 3 % GEL Apply 1 each topically 2 (two) times daily. 100 g 3  . fluticasone (FLONASE) 50 MCG/ACT nasal spray Place 2 sprays into both nostrils daily. 16 g 2  . gabapentin (NEURONTIN) 300 MG capsule Take 3 capsules (900 mg total) by mouth at bedtime. 90 capsule 3  . methocarbamol (ROBAXIN) 500 MG tablet Take 2 tablets (1,000 mg total) by mouth 4 (four) times daily. 90 tablet 0  . mometasone-formoterol (DULERA) 200-5 MCG/ACT AERO Inhale 2 puffs into the lungs 2 (two) times daily. 13 g 3  . Multiple Vitamin (MULTIVITAMIN) tablet Take 1 tablet by mouth daily.    . naltrexone (DEPADE) 50 MG tablet Take 1 tablet (50 mg total) by mouth daily. 30 tablet 0  . pregabalin (LYRICA) 75 MG capsule Take 1 capsule (75 mg total) by mouth 2 (two) times daily. 60 capsule 2  . Spacer/Aero-Holding Chambers (AEROCHAMBER MV) inhaler Use as instructed 1 each 0  . topiramate (TOPAMAX) 50 MG tablet Take 1 tablet (50 mg total) by mouth at bedtime. (Patient not taking: Reported on 05/14/2020) 30 tablet 3  . nicotine (NICODERM CQ - DOSED IN MG/24 HOURS) 14 mg/24hr patch Place 1 patch (14 mg total) onto the skin daily. 28 patch 1  . predniSONE (STERAPRED UNI-PAK 21 TAB) 10 MG (21) TBPK tablet Take as directed 21 tablet 0  . promethazine-dextromethorphan (PROMETHAZINE-DM) 6.25-15 MG/5ML syrup Take 5 mLs by mouth 4 (four) times daily as needed for cough. 240 mL 0  . traZODone (DESYREL) 50 MG tablet Take 1 tablet (50 mg  total) by mouth at bedtime as needed for sleep. 30 tablet 3   No facility-administered medications prior to visit.    Allergies  Allergen Reactions  . Diphenhydramine Hcl Itching  . Metoprolol Palpitations    Review of Systems  Musculoskeletal: Positive for gait problem and joint swelling.       Knee pain   All other systems reviewed and are negative.      Objective:    Physical Exam Vitals reviewed.  Constitutional:      Appearance: She is obese.  HENT:     Head: Normocephalic.  Eyes:     Extraocular Movements: Extraocular movements intact.  Cardiovascular:     Rate and Rhythm: Normal rate and regular rhythm.  Pulmonary:     Effort: Pulmonary effort is normal.     Breath sounds: Normal breath sounds.  Abdominal:     General: Bowel sounds are normal. There is distension.  Musculoskeletal:        General: Normal range of motion.     Cervical back: Normal range of motion and neck supple.  Skin:    General: Skin is warm and dry.  Neurological:     Mental Status: She is alert and oriented to person, place, and time.  Psychiatric:        Mood and Affect: Mood normal.        Behavior: Behavior normal.        Thought Content: Thought content normal.        Judgment: Judgment normal.     BP (!) 142/99 (BP Location: Left Arm, Patient Position: Sitting, Cuff Size: Normal)   Pulse 85   Temp (!) 97.5 F (36.4 C) (Temporal)  Ht 5\' 4"  (1.626 m)   Wt 189 lb (85.7 kg)   SpO2 93%   BMI 32.44 kg/m  Wt Readings from Last 3 Encounters:  05/14/20 189 lb (85.7 kg)  04/28/20 190 lb 6.4 oz (86.4 kg)  03/30/20 190 lb 3.2 oz (86.3 kg)    Health Maintenance Due  Topic Date Due  . Hepatitis C Screening  Never done  . PNEUMOCOCCAL POLYSACCHARIDE VACCINE AGE 24-64 HIGH RISK  Never done  . FOOT EXAM  Never done  . OPHTHALMOLOGY EXAM  Never done  . COVID-19 Vaccine (1) Never done  . PAP SMEAR-Modifier  Never done  . COLONOSCOPY  Never done    There are no preventive care  reminders to display for this patient.   Lab Results  Component Value Date   TSH 0.502 07/31/2016   Lab Results  Component Value Date   WBC 6.1 12/16/2019   HGB 13.0 12/16/2019   HCT 37.9 12/16/2019   MCV 90 12/16/2019   PLT 363 12/16/2019   Lab Results  Component Value Date   NA 137 12/19/2019   K 4.5 12/19/2019   CO2 25 12/19/2019   GLUCOSE 95 12/19/2019   BUN 11 12/19/2019   CREATININE 0.89 12/19/2019   BILITOT <0.2 03/30/2020   ALKPHOS 85 03/30/2020   AST 28 03/30/2020   ALT 21 03/30/2020   PROT 7.8 03/30/2020   ALBUMIN 4.3 03/30/2020   CALCIUM 9.4 12/19/2019   ANIONGAP 10 12/19/2019   Lab Results  Component Value Date   CHOL 232 (H) 12/16/2019   Lab Results  Component Value Date   HDL 44 12/16/2019   Lab Results  Component Value Date   LDLCALC 152 (H) 12/16/2019   Lab Results  Component Value Date   TRIG 195 (H) 12/16/2019   Lab Results  Component Value Date   CHOLHDL 5.3 (H) 12/16/2019   Lab Results  Component Value Date   HGBA1C 5.9 (H) 03/30/2020       Assessment & Plan:   [name] [name] was seen today for knee pain and edema.  Diagnoses and all orders for this visit:  Chronic pain of right knee -     Ambulatory referral to Orthopedic Surgery  Essential hypertension Counseled on blood pressure goal of less than 130/80, low-sodium, DASH diet, medication compliance, 150 minutes of moderate intensity exercise per week. Discussed medication compliance, adverse effects.   No orders of the defined types were placed in this encounter.    Judeth Cornfield, NP

## 2020-05-14 NOTE — Progress Notes (Signed)
Subjective: [name] [name] is a 54 y.o. female patient returns to office with complaint of moderate heel pain on the right and pain at back of heel that still has not gotten better with increased burning and tingling to both feet and ankles also along the sides reports that she wants to discuss other treatment options and does not want injections at this time.  Patient Active Problem List   Diagnosis Date Noted  . OSA (obstructive sleep apnea) 04/01/2020  . Moderate persistent asthma with acute exacerbation 12/30/2019  . Chronic coughing 12/30/2019  . Seasonal allergies 12/30/2019  . Loud snoring 12/16/2019  . Prediabetes 01/04/2019  . Obesity (BMI 30-39.9) 01/04/2019  . Ganglion cyst 09/28/2018  . Carpal tunnel syndrome of right wrist 09/28/2018  . Chronic midline low back pain without sciatica 11/23/2017  . Essential hypertension 11/23/2017  . Chronic pain of right knee 11/23/2017  . 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 Marijuana abuse 11/23/2017  . Tobacco dependence 11/23/2017  . Alcohol abuse 07/30/2016  . S/P ACL reconstruction 01/31/2014  . DEPRESSION 11/17/2008  . ALLERGIC RHINITIS 11/17/2008  . GERD 11/17/2008    Current Outpatient Medications on File Prior to Visit  Medication Sig Dispense Refill  . acetaminophen (TYLENOL) 500 MG tablet Take 1 tablet (500 mg total) by mouth every 6 (six) hours as needed for up to 30 doses. 30 tablet 0  . albuterol (PROVENTIL) (2.5 MG/3ML) 0.083% nebulizer solution Take 3 mLs (2.5 mg total) by nebulization every 6 (six) hours as needed for wheezing or shortness of breath. 150 mL 1  . albuterol (VENTOLIN HFA) 108 (90 Base) MCG/ACT inhaler Inhale 2 puffs into the lungs every 6 (six) hours as needed for wheezing or shortness of breath. 18 g 2  . amLODipine (NORVASC) 10 MG tablet Take 1 tablet (10 mg total) by mouth daily. 30 tablet 6  . Ascorbic Acid (VITAMIN C PO) Take 1 tablet by mouth daily.    Marland Kitchen atorvastatin (LIPITOR) 10 MG  tablet Take 1 tablet (10 mg total) by mouth daily. 90 tablet 3  . baclofen (LIORESAL) 10 MG tablet Take 0.5-1 tablets (5-10 mg total) by mouth at bedtime as needed for muscle spasms. 30 each 3  . betamethasone dipropionate 0.05 % cream Apply topically 2 (two) times daily. 15 g 0  . celecoxib (CELEBREX) 200 MG capsule Take 1 capsule (200 mg total) by mouth daily. 30 capsule 6  . cetirizine (ZYRTEC) 10 MG tablet Take 1 tablet (10 mg total) by mouth daily. 30 tablet 11  . Cyanocobalamin (VITAMIN B-12 PO) Take 1 tablet by mouth daily.    . Diclofenac Sodium 3 % GEL Apply 1 each topically 2 (two) times daily. 100 g 3  . DULoxetine (CYMBALTA) 30 MG capsule Take 1 capsule (30 mg total) by mouth daily. 30 capsule 3  . fluticasone (FLONASE) 50 MCG/ACT nasal spray Place 2 sprays into both nostrils daily. 16 g 2  . gabapentin (NEURONTIN) 300 MG capsule Take 3 capsules (900 mg total) by mouth at bedtime. 90 capsule 3  . hydrochlorothiazide (HYDRODIURIL) 25 MG tablet Take 1 tablet (25 mg total) by mouth daily. 30 tablet 6  . losartan (COZAAR) 50 MG tablet Take 1 tablet (50 mg total) by mouth daily. 30 tablet 3  . methocarbamol (ROBAXIN) 500 MG tablet Take 2 tablets (1,000 mg total) by mouth 4 (four) times daily. 90 tablet 0  . mometasone-formoterol (DULERA) 200-5 MCG/ACT AERO Inhale 2 puffs into the lungs 2 (two) times daily.  13 g 3  . Multiple Vitamin (MULTIVITAMIN) tablet Take 1 tablet by mouth daily.    . naltrexone (DEPADE) 50 MG tablet Take 1 tablet (50 mg total) by mouth daily. 30 tablet 0  . nicotine (NICODERM CQ - DOSED IN MG/24 HOURS) 14 mg/24hr patch Place 1 patch (14 mg total) onto the skin daily. 28 patch 1  . ondansetron (ZOFRAN ODT) 4 MG disintegrating tablet Take 1 tablet (4 mg total) by mouth every 8 (eight) hours as needed for nausea or vomiting. 20 tablet 0  . pantoprazole (PROTONIX) 40 MG tablet Take 1 tablet (40 mg total) by mouth daily. 30 tablet 3  . polyethylene glycol powder  (GLYCOLAX/MIRALAX) 17 GM/SCOOP powder Take 17 g by mouth daily as needed for mild constipation. 3350 g 1  . potassium chloride (KLOR-CON) 10 MEQ tablet Take 1 tablet (10 mEq total) by mouth daily. 30 tablet 2  . promethazine-dextromethorphan (PROMETHAZINE-DM) 6.25-15 MG/5ML syrup Take 5 mLs by mouth 4 (four) times daily as needed for cough. 240 mL 0  . Spacer/Aero-Holding Chambers (AEROCHAMBER MV) inhaler Use as instructed 1 each 0  . SUMAtriptan (IMITREX) 100 MG tablet Take 1 tablet earliest onset of migraine.  May repeat in 2 hours if headache persists or recurs.  Maximum 2 tablets in 24 hours. 10 tablet 3  . topiramate (TOPAMAX) 25 MG tablet Take 25 mg by mouth at bedtime.    . topiramate (TOPAMAX) 50 MG tablet Take 1 tablet (50 mg total) by mouth at bedtime. 30 tablet 3  . traMADol (ULTRAM) 50 MG tablet Take 1 tablet (50 mg total) by mouth every 8 (eight) hours as needed. 20 tablet 0  . traZODone (DESYREL) 50 MG tablet Take 1 tablet (50 mg total) by mouth at bedtime as needed for sleep. 30 tablet 3  . triamcinolone cream (KENALOG) 0.1 % Apply 1 application topically 2 (two) times daily. 30 g 2   No current facility-administered medications on file prior to visit.    Allergies  Allergen Reactions  . Diphenhydramine Hcl Itching  . Metoprolol Palpitations   Social History   Socioeconomic History  . Marital status: Legally Separated    Spouse name: Not on file  . Number of children: Not on file  . Years of education: Not on file  . Highest education level: Not on file  Occupational History  . Not on file  Tobacco Use  . Smoking status: Current Every Day Smoker    Packs/day: 0.25    Years: 32.00    Pack years: 8.00    Types: Cigarettes  . Smokeless tobacco: Never Used  Vaping Use  . Vaping Use: Never used  Substance and Sexual Activity  . Alcohol use: Not Currently  . Drug use: Not Currently    Types: Marijuana    Comment: occasional  marijuana--  hx  "crack" use  last used  2004--  no treatment rehab (done on her own)  . Sexual activity: Not on file  Other Topics Concern  . Not on file  Social History Narrative  . Not on file   Social Determinants of Health   Financial Resource Strain:   . Difficulty of Paying Living Expenses: Not on file  Food Insecurity:   . Worried About Programme researcher, broadcasting/film/video in the Last Year: Not on file  . Ran Out of Food in the Last Year: Not on file  Transportation Needs:   . Lack of Transportation (Medical): Not on file  . Lack of Transportation (  Non-Medical): Not on file  Physical Activity:   . Days of Exercise per Week: Not on file  . Minutes of Exercise per Session: Not on file  Stress:   . Feeling of Stress : Not on file  Social Connections:   . Frequency of Communication with Friends and Family: Not on file  . Frequency of Social Gatherings with Friends and Family: Not on file  . Attends Religious Services: Not on file  . Active Member of Clubs or Organizations: Not on file  . Attends Banker Meetings: Not on file  . Marital Status: Not on file    Family History  Problem Relation Age of Onset  . Hypertension Father   . Breast cancer Maternal Aunt     Past Surgical History:  Procedure Laterality Date  . ABDOMINAL HYSTERECTOMY    . BUNIONECTOMY Left 2011  . HEEL SPUR SURGERY Right 2012  . KNEE ARTHROSCOPY WITH ANTERIOR CRUCIATE LIGAMENT (ACL) REPAIR WITH HAMSTRING GRAFT Right 01/31/2014   Procedure: RIGHT KNEE ARTHROSCOPY WITH ALLOGRAFT (ACL) ANTERIOR CRUCIATE LIGAMENT RECONSTRUCTON PARTIAL MENISCECTOMY VERSES REPAIR ;  Surgeon: Eugenia Mcalpine, MD;  Location: [facility] [place];  Service: Orthopedics;  Laterality: Right;  . TOTAL ABDOMINAL HYSTERECTOMY W/ BILATERAL SALPINGOOPHORECTOMY  04-20-2006    Objective: Physical Exam General: The patient is alert and oriented x3 in no acute distress.  Dermatology: Skin is warm, dry and supple bilateral lower extremities. Nails 1-10 are normal. There  is no erythema, edema, no eccymosis, no open lesions present. Integument is otherwise unremarkable.  Old scar to right posterior heel with central painful raised area no redness no warmth no active drainage no other acute signs of infection.  Vascular: Dorsalis Pedis pulse and Posterior Tibial pulse are 2/4 bilateral. Capillary fill time is immediate to all digits.  Neurological: Grossly intact to light touch with subjective burning bilateral.  Musculoskeletal: Tenderness to palpation at the medial calcaneal tubercale and through the insertion of the plantar fascia on the right and subjective pain to both ankles.  Admits to also worsening right knee pain and swelling.  Strength 5/5 in all groups bilateral.   Assessment and Plan: Problem List Items Addressed This Visit    None    Visit Diagnoses    Scar painful    -  Primary   Achilles tendinitis, right leg       Plantar fasciitis       Heel spur, right       Right foot pain       Bilateral foot pain       Neuropathy          -Complete examination performed.  -Re-Discussed with patient in detail the condition of plantar fasciitis and residual tendinitis pain at scar at right heel from previous surgery and bilateral neuropathy, how this occurs and general treatment options. Explained both conservative and surgical treatments.  -Prescribed a low-dose hydrocodone for patient severe pain and symptoms but this time only -Prescribed Lyrica for patient to take for likely neuropathy symptoms -Prescribed steroid Dosepak for patient to take for inflammation if above medicines are ineffective -Advised patient if pain continues may benefit from excision of scar and plantar fascial release on right return to office next visit for surgery reevaluation or sooner if problems or issues arise.  Asencion Islam, DPM

## 2020-05-20 ENCOUNTER — Ambulatory Visit: Payer: Self-pay

## 2020-05-20 ENCOUNTER — Ambulatory Visit (INDEPENDENT_AMBULATORY_CARE_PROVIDER_SITE_OTHER): Payer: No Typology Code available for payment source | Admitting: Orthopaedic Surgery

## 2020-05-20 DIAGNOSIS — G8929 Other chronic pain: Secondary | ICD-10-CM

## 2020-05-20 DIAGNOSIS — M25561 Pain in right knee: Secondary | ICD-10-CM

## 2020-05-20 MED ORDER — DICLOFENAC SODIUM 75 MG PO TBEC: 75.0000 mg | DELAYED_RELEASE_TABLET | Freq: Two times a day (BID) | ORAL | 2 refills | Status: DC

## 2020-05-20 NOTE — Progress Notes (Signed)
Office Visit Note   Patient: [name] [name]           Date of Birth: [birth year]           MRN: [id number] Visit Date: 05/20/2020              Requested by: Grayce Sessions, NP [address] [place],  [place] [address] PCP: Marcine Matar, MD   Assessment & Plan: Visit Diagnoses:  1. Chronic pain of right knee     Plan: Impression is chronic right knee pain likely due to arthritis flareup.  Based on discussion of treatment options she would like to try a course of outpatient physical therapy and a compression knee brace.  We will also prescribe oral diclofenac.  She declined cortisone injection today.  Follow-up as needed.  Follow-Up Instructions: Return if symptoms worsen or fail to improve.   Orders:  Orders Placed This Encounter  Procedures  . XR KNEE 3 VIEW RIGHT  . Ambulatory referral to Physical Therapy   Meds ordered this encounter  Medications  . diclofenac (VOLTAREN) 75 MG EC tablet    Sig: Take 1 tablet (75 mg total) by mouth 2 (two) times daily.    Dispense:  30 tablet    Refill:  2      Procedures: No procedures performed   Clinical Data: No additional findings.   Subjective: Chief Complaint  Patient presents with  . Right Knee - Pain    Clint is a 54 year old female comes in for evaluation of chronic right knee pain with onset over the last 2 months without any injuries.  She has been using Tylenol she feels popping in the knee.  She states that it was swollen last week.  She has had ACL reconstruction back in 2015 by Dr.Collins.  She states that she feels like the knee wants to give out and cause her to fall.   Review of Systems  Constitutional: Negative.   HENT: Negative.   Eyes: Negative.   Respiratory: Negative.   Cardiovascular: Negative.   Endocrine: Negative.   Musculoskeletal: Negative.   Neurological: Negative.   Hematological: Negative.   Psychiatric/Behavioral: Negative.   All other systems reviewed and are  negative.    Objective: Vital Signs: There were no vitals taken for this visit.  Physical Exam Vitals and nursing note reviewed.  Constitutional:      Appearance: She is well-developed.  HENT:     Head: Normocephalic and atraumatic.  Pulmonary:     Effort: Pulmonary effort is normal.  Abdominal:     Palpations: Abdomen is soft.  Musculoskeletal:     Cervical back: Neck supple.  Skin:    General: Skin is warm.     Capillary Refill: Capillary refill takes less than 2 seconds.  Neurological:     Mental Status: She is alert and oriented to person, place, and time.  Psychiatric:        Behavior: Behavior normal.        Thought Content: Thought content normal.        Judgment: Judgment normal.     Ortho Exam Right knee shows well-healed surgical scars.  ACL graft feels stable.  Trace joint effusion.  No significant joint line tenderness.  Minimal crepitus with range of motion.  Normal range of motion.  Collaterals and cruciates are stable. Specialty Comments:  No specialty comments available.  Imaging: XR KNEE 3 VIEW RIGHT  Result Date: 05/20/2020 Status post ACL reconstruction with metallic buttons.  Well-preserved joint spaces.    PMFS History: Patient Active Problem List   Diagnosis Date Noted  . OSA (obstructive sleep apnea) 04/01/2020  . Moderate persistent asthma with acute exacerbation 12/30/2019  . Chronic coughing 12/30/2019  . Seasonal allergies 12/30/2019  . Loud snoring 12/16/2019  . Prediabetes 01/04/2019  . Obesity (BMI 30-39.9) 01/04/2019  . Ganglion cyst 09/28/2018  . Carpal tunnel syndrome of right wrist 09/28/2018  . Chronic midline low back pain without sciatica 11/23/2017  . Essential hypertension 11/23/2017  . Chronic pain of right knee 11/23/2017  . 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 Marijuana abuse 11/23/2017  . Tobacco dependence 11/23/2017  . Alcohol abuse 07/30/2016  . S/P ACL reconstruction 01/31/2014  . DEPRESSION  11/17/2008  . ALLERGIC RHINITIS 11/17/2008  . GERD 11/17/2008   Past Medical History:  Diagnosis Date  . Asthma   . Bipolar 1 disorder (HCC)   . Chronic lumbar pain   . DDD (degenerative disc disease)   . Depression   . GERD (gastroesophageal reflux disease)   . History of gastric ulcer   . Hyperlipidemia   . Hypertension   . Migraine   . Osteoarthritis   . Right ACL tear   . Right knee meniscal tear   . Sciatica   . Wears glasses     Family History  Problem Relation Age of Onset  . Hypertension Father   . Breast cancer Maternal Aunt     Past Surgical History:  Procedure Laterality Date  . ABDOMINAL HYSTERECTOMY    . BUNIONECTOMY Left 2011  . HEEL SPUR SURGERY Right 2012  . KNEE ARTHROSCOPY WITH ANTERIOR CRUCIATE LIGAMENT (ACL) REPAIR WITH HAMSTRING GRAFT Right 01/31/2014   Procedure: RIGHT KNEE ARTHROSCOPY WITH ALLOGRAFT (ACL) ANTERIOR CRUCIATE LIGAMENT RECONSTRUCTON PARTIAL MENISCECTOMY VERSES REPAIR ;  Surgeon: Eugenia Mcalpine, MD;  Location: [facility];  Service: Orthopedics;  Laterality: Right;  . TOTAL ABDOMINAL HYSTERECTOMY W/ BILATERAL SALPINGOOPHORECTOMY  04-20-2006   Social History   Occupational History  . Not on file  Tobacco Use  . Smoking status: Current Every Day Smoker    Packs/day: 0.25    Years: 32.00    Pack years: 8.00    Types: Cigarettes  . Smokeless tobacco: Never Used  Vaping Use  . Vaping Use: Never used  Substance and Sexual Activity  . Alcohol use: Not Currently  . Drug use: Not Currently    Types: Marijuana    Comment: occasional  marijuana--  hx  "crack" use  last used 2004--  no treatment rehab (done on her own)  . Sexual activity: Not on file

## 2020-05-26 ENCOUNTER — Emergency Department (HOSPITAL_COMMUNITY)
Admission: EM | Admit: 2020-05-26 | Discharge: 2020-05-26 | Disposition: A | Discharge status: Home / Self Care | Payer: Self-pay | Attending: Emergency Medicine | Admitting: Emergency Medicine

## 2020-05-26 ENCOUNTER — Encounter (HOSPITAL_COMMUNITY): Payer: Self-pay

## 2020-05-26 ENCOUNTER — Other Ambulatory Visit: Payer: Self-pay

## 2020-05-26 ENCOUNTER — Emergency Department (HOSPITAL_COMMUNITY): Payer: Self-pay

## 2020-05-26 DIAGNOSIS — Z79899 Other long term (current) drug therapy: Secondary | ICD-10-CM | POA: Insufficient documentation

## 2020-05-26 DIAGNOSIS — F17210 Nicotine dependence, cigarettes, uncomplicated: Secondary | ICD-10-CM | POA: Insufficient documentation

## 2020-05-26 DIAGNOSIS — G8929 Other chronic pain: Secondary | ICD-10-CM | POA: Insufficient documentation

## 2020-05-26 DIAGNOSIS — M25561 Pain in right knee: Secondary | ICD-10-CM | POA: Insufficient documentation

## 2020-05-26 DIAGNOSIS — J45909 Unspecified asthma, uncomplicated: Secondary | ICD-10-CM | POA: Insufficient documentation

## 2020-05-26 DIAGNOSIS — I10 Essential (primary) hypertension: Secondary | ICD-10-CM | POA: Insufficient documentation

## 2020-05-26 MED ORDER — IBUPROFEN 800 MG PO TABS
800.0000 mg | ORAL_TABLET | Freq: Two times a day (BID) | ORAL | 0 refills | Status: AC
Start: 2020-05-26 — End: 2020-06-02

## 2020-05-26 MED ORDER — IBUPROFEN 200 MG PO TABS
600.0000 mg | ORAL_TABLET | Freq: Once | ORAL | Status: AC
  Administered 2020-05-26: 600 mg via ORAL
  Filled 2020-05-26: qty 3

## 2020-05-26 NOTE — Discharge Instructions (Addendum)
You have been seen here for right knee pain. I recommend taking over-the-counter pain medications like ibuprofen and/or Tylenol every 6 as needed.  please follow dosage on the back of bottle.  I also recommend applying ice to the area and keeping the knee elevated as this will help decrease swelling and pain.  I also recommend wearing your knee brace.  I recommend following up with your orthopedic doctor after 2 weeks of physical therapy especially if symptoms have not improved.  Come back to the emergency department if you develop chest pain, shortness of breath, severe abdominal pain, uncontrolled nausea, vomiting, diarrhea.

## 2020-05-26 NOTE — ED Triage Notes (Signed)
Patient c/o right knee swelling x 4 days. Patient states she has had intermittent swelling and pain x 1 1/2 months. Patient states she has had surgery twice to the right knee. Patient states she was recently prescribed Hydrocodone and has taken all of the pain medicine and the right knee still hurts.

## 2020-05-26 NOTE — ED Provider Notes (Signed)
[facility]-EMERGENCY DEPT Provider Note   CSN: [id number] Arrival date & time: 05/26/20  1310     History Chief Complaint  Patient presents with  . Joint Swelling    [name] [name] is a 54 y.o. female.  HPI   Patient with significant medical history of asthma, bipolar, history of gastric ulcers, hypertension, right ACL tear right knee meniscal tear presents to the emergency department with chief complaint of right knee pain.  Patient states she has had increasing pain for the last few weeks.  She denies any recent trauma to it, states the pain stays on the anterior aspect of her knee feels like a throbbing sensation especially when she tries to walk on it.  She seen by her orthopedic doctor on 10/13 where he suspects this is likely a arthritis flareup.  He recommends outpatient physical therapy compression knee brace and will prescribe her oral diclofenac.  Patient states she took all of her hydrocodone and is still in pain.  Patient denies any alleviating factors and wants to know why her knee is hurting her.  Patient has no IV drug use Hx, denies fevers or chills, no rashes, red, hot swollen joints noted.  Patient denies headache, fever, chills, short of breath, chest pain, dumping, nausea, vomiting, diarrhea, pedal edema.  Past Medical History:  Diagnosis Date  . Asthma   . Bipolar 1 disorder (HCC)   . Chronic lumbar pain   . DDD (degenerative disc disease)   . Depression   . GERD (gastroesophageal reflux disease)   . History of gastric ulcer   . Hyperlipidemia   . Hypertension   . Migraine   . Osteoarthritis   . Right ACL tear   . Right knee meniscal tear   . Sciatica   . Wears glasses     Patient Active Problem List   Diagnosis Date Noted  . OSA (obstructive sleep apnea) 04/01/2020  . Moderate persistent asthma with acute exacerbation 12/30/2019  . Chronic coughing 12/30/2019  . Seasonal allergies 12/30/2019  . Loud snoring 12/16/2019  .  Prediabetes 01/04/2019  . Obesity (BMI 30-39.9) 01/04/2019  . Ganglion cyst 09/28/2018  . Carpal tunnel syndrome of right wrist 09/28/2018  . Chronic midline low back pain without sciatica 11/23/2017  . Essential hypertension 11/23/2017  . Chronic pain of right knee 11/23/2017  . 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 Marijuana abuse 11/23/2017  . Tobacco dependence 11/23/2017  . Alcohol abuse 07/30/2016  . S/P ACL reconstruction 01/31/2014  . DEPRESSION 11/17/2008  . ALLERGIC RHINITIS 11/17/2008  . GERD 11/17/2008    Past Surgical History:  Procedure Laterality Date  . ABDOMINAL HYSTERECTOMY    . BUNIONECTOMY Left 2011  . HEEL SPUR SURGERY Right 2012  . KNEE ARTHROSCOPY WITH ANTERIOR CRUCIATE LIGAMENT (ACL) REPAIR WITH HAMSTRING GRAFT Right 01/31/2014   Procedure: RIGHT KNEE ARTHROSCOPY WITH ALLOGRAFT (ACL) ANTERIOR CRUCIATE LIGAMENT RECONSTRUCTON PARTIAL MENISCECTOMY VERSES REPAIR ;  Surgeon: Eugenia Mcalpineobert Collins, MD;  [facility];  Service: Orthopedics;  Laterality: Right;  . TOTAL ABDOMINAL HYSTERECTOMY W/ BILATERAL SALPINGOOPHORECTOMY  04-20-2006     OB History   No obstetric history on file.     Family History  Problem Relation Age of Onset  . Hypertension Father   . Breast cancer Maternal Aunt     Social History   Tobacco Use  . Smoking status: Current Every Day Smoker    Packs/day: 0.25    Years: 32.00    Pack years: 8.00  Types: Cigarettes  . Smokeless tobacco: Never Used  Vaping Use  . Vaping Use: Never used  Substance Use Topics  . Alcohol use: Not Currently  . Drug use: Not Currently    Types: Marijuana    Comment: occasional  marijuana--  hx  "crack" use  last used 2004--  no treatment rehab (done on her own)    Home Medications Prior to Admission medications   Medication Sig Start Date End Date Taking? Authorizing Provider  acetaminophen (TYLENOL) 500 MG tablet Take 1 tablet (500 mg total) by mouth every 6 (six) hours  as needed for up to 30 doses. 06/27/19   Curatolo, Adam, DO  albuterol (PROVENTIL) (2.5 MG/3ML) 0.083% nebulizer solution Take 3 mLs (2.5 mg total) by nebulization every 6 (six) hours as needed for wheezing or shortness of breath. 01/20/20   Marcine Matar, MD  albuterol (VENTOLIN HFA) 108 (90 Base) MCG/ACT inhaler Inhale 2 puffs into the lungs every 6 (six) hours as needed for wheezing or shortness of breath. 04/28/20   Marcine Matar, MD  amLODipine (NORVASC) 10 MG tablet Take 1 tablet (10 mg total) by mouth daily. 01/20/20   Marcine Matar, MD  Ascorbic Acid (VITAMIN C PO) Take 1 tablet by mouth daily.    [provider]  atorvastatin (LIPITOR) 10 MG tablet Take 1 tablet (10 mg total) by mouth daily. 12/18/19   Marcine Matar, MD  baclofen (LIORESAL) 10 MG tablet Take 0.5-1 tablets (5-10 mg total) by mouth at bedtime as needed for muscle spasms. 11/01/19   Hilts, Casimiro Needle, MD  betamethasone dipropionate 0.05 % cream Apply topically 2 (two) times daily. 02/18/20   Cathren Laine, MD  celecoxib (CELEBREX) 200 MG capsule Take 1 capsule (200 mg total) by mouth daily. 04/28/20   Marcine Matar, MD  cetirizine (ZYRTEC) 10 MG tablet Take 1 tablet (10 mg total) by mouth daily. 12/30/19   Storm Frisk, MD  Cyanocobalamin (VITAMIN B-12 PO) Take 1 tablet by mouth daily.    [provider]  diclofenac (VOLTAREN) 75 MG EC tablet Take 1 tablet (75 mg total) by mouth 2 (two) times daily. 05/20/20   Tarry Kos, MD  Diclofenac Sodium 3 % GEL Apply 1 each topically 2 (two) times daily. 09/25/19   Anders Simmonds, PA-C  DULoxetine (CYMBALTA) 30 MG capsule Take 1 capsule (30 mg total) by mouth daily. 04/28/20   Marcine Matar, MD  fluticasone (FLONASE) 50 MCG/ACT nasal spray Place 2 sprays into both nostrils daily. 12/30/19   Storm Frisk, MD  gabapentin (NEURONTIN) 300 MG capsule Take 3 capsules (900 mg total) by mouth at bedtime. 09/25/19   Anders Simmonds, PA-C   hydrochlorothiazide (HYDRODIURIL) 25 MG tablet Take 1 tablet (25 mg total) by mouth daily. 02/27/20   Anders Simmonds, PA-C  HYDROcodone-acetaminophen (NORCO) 10-325 MG tablet Take 1 tablet by mouth every 6 (six) hours as needed. 05/14/20   Asencion Islam, DPM  ibuprofen (ADVIL) 800 MG tablet Take 1 tablet (800 mg total) by mouth 2 (two) times daily for 7 days. 05/26/20 06/02/20  Carroll Sage, PA-C  losartan (COZAAR) 50 MG tablet Take 1 tablet (50 mg total) by mouth daily. 02/27/20   Anders Simmonds, PA-C  methocarbamol (ROBAXIN) 500 MG tablet Take 2 tablets (1,000 mg total) by mouth 4 (four) times daily. 10/30/19   Anders Simmonds, PA-C  mometasone-formoterol (DULERA) 200-5 MCG/ACT AERO Inhale 2 puffs into the lungs 2 (two) times  daily. 12/30/19   Storm Frisk, MD  Multiple Vitamin (MULTIVITAMIN) tablet Take 1 tablet by mouth daily.    [provider]  naltrexone (DEPADE) 50 MG tablet Take 1 tablet (50 mg total) by mouth daily. 04/03/20   Marcine Matar, MD  ondansetron (ZOFRAN ODT) 4 MG disintegrating tablet Take 1 tablet (4 mg total) by mouth every 8 (eight) hours as needed for nausea or vomiting. 10/31/19   Henderly, Britni A, PA-C  pantoprazole (PROTONIX) 40 MG tablet Take 1 tablet (40 mg total) by mouth daily. 12/30/19   Storm Frisk, MD  polyethylene glycol powder (GLYCOLAX/MIRALAX) 17 GM/SCOOP powder Take 17 g by mouth daily as needed for mild constipation. 11/18/19   Marcine Matar, MD  potassium chloride (KLOR-CON) 10 MEQ tablet Take 1 tablet (10 mEq total) by mouth daily. 08/22/19   Anders Simmonds, PA-C  pregabalin (LYRICA) 75 MG capsule Take 1 capsule (75 mg total) by mouth 2 (two) times daily. 05/14/20   Asencion Islam, DPM  Spacer/Aero-Holding Chambers (AEROCHAMBER MV) inhaler Use as instructed 12/30/19   Storm Frisk, MD  SUMAtriptan (IMITREX) 100 MG tablet Take 1 tablet earliest onset of migraine.  May repeat in 2 hours if headache persists or  recurs.  Maximum 2 tablets in 24 hours. 11/13/19   Drema Dallas, DO  topiramate (TOPAMAX) 25 MG tablet Take 25 mg by mouth at bedtime. 11/21/19   [provider]  topiramate (TOPAMAX) 50 MG tablet Take 1 tablet (50 mg total) by mouth at bedtime. Patient not taking: Reported on 05/14/2020 11/20/19   Drema Dallas, DO  traMADol (ULTRAM) 50 MG tablet Take 1 tablet (50 mg total) by mouth every 8 (eight) hours as needed. 04/28/20   Marcine Matar, MD  triamcinolone cream (KENALOG) 0.1 % Apply 1 application topically 2 (two) times daily. 02/27/20   Anders Simmonds, PA-C    Allergies    Diphenhydramine hcl and Metoprolol  Review of Systems   Review of Systems  Constitutional: Negative for chills and fever.  HENT: Negative for congestion, tinnitus, trouble swallowing and voice change.   Eyes: Negative for visual disturbance.  Respiratory: Negative for shortness of breath.   Cardiovascular: Negative for chest pain and palpitations.  Gastrointestinal: Negative for abdominal pain, diarrhea, nausea and vomiting.  Genitourinary: Negative for dysuria and enuresis.  Musculoskeletal: Negative for back pain.        Right knee pain.  Skin: Negative for rash.  Neurological: Negative for dizziness and headaches.  Hematological: Does not bruise/bleed easily.    Physical Exam Updated Vital Signs BP (!) 147/102 (BP Location: Right Arm)   Pulse 79   Temp 98.8 F (37.1 C) (Oral)   Resp 16   Ht 5\' 4"  (1.626 m)   Wt 85.7 kg   SpO2 98%   BMI 32.44 kg/m   Physical Exam Vitals and nursing note reviewed.  Constitutional:      General: She is not in acute distress.    Appearance: She is not ill-appearing.  HENT:     Head: Normocephalic and atraumatic.     Nose: No congestion.     Mouth/Throat:     Mouth: Mucous membranes are moist.     Pharynx: Oropharynx is clear.  Eyes:     General: No scleral icterus. Cardiovascular:     Rate and Rhythm: Normal rate and regular rhythm.     Pulses:  Normal pulses.     Heart sounds: No murmur heard.  No friction rub. No gallop.   Pulmonary:     Effort: No respiratory distress.     Breath sounds: No wheezing, rhonchi or rales.  Abdominal:     General: There is no distension.     Tenderness: There is no abdominal tenderness. There is no right CVA tenderness, left CVA tenderness or guarding.  Musculoskeletal:        General: Swelling and tenderness present. No deformity or signs of injury.     Right lower leg: No edema.     Left lower leg: No edema.     Comments: Patient's right knee was visualized, it was edematous, nonerythematous, not warm to the touch, tenderness to palpation on the anterior aspect of the patella.  No crepitus, or gross deformities felt.  She had full range of motion, 5 of 5 strength at her knee, ankle, toes, neurovascular fully intact.  Skin:    General: Skin is warm and dry.     Coloration: Skin is not jaundiced or pale.     Findings: No lesion or rash.     Comments: Skin exam was performed no rashes, laceration, abrasions, erythematous or swollen warm joints noted on exam.  Neurological:     Mental Status: She is alert.  Psychiatric:        Mood and Affect: Mood normal.     ED Results / Procedures / Treatments   Labs (all labs ordered are listed, but only abnormal results are displayed) Labs Reviewed - No data to display  EKG None  Radiology DG Knee Complete 4 Views Right  Result Date: 05/26/2020 CLINICAL DATA:  Knee pain and swelling over the last 4 days. EXAM: RIGHT KNEE - COMPLETE 4+ VIEW COMPARISON:  05/20/2020.  01/17/2020. FINDINGS: Moderate knee joint effusion. Previous tunneled ACL repair. No medial or lateral compartment joint space narrowing. Some widening of the lateral compartment which could be seen with discoid meniscus. Patellofemoral joint appears unremarkable. IMPRESSION: 1. Moderate knee joint effusion. Previous tunneled ACL repair. 2. Some widening of the lateral compartment which  could be seen with discoid meniscus. No medial or lateral compartment joint space narrowing. Electronically Signed   By: Paulina Fusi M.D.   On: 05/26/2020 14:42    Procedures Procedures (including critical care time)  Medications Ordered in ED Medications  ibuprofen (ADVIL) tablet 600 mg (600 mg Oral Given 05/26/20 1610)    ED Course  I have reviewed the triage vital signs and the nursing notes.  Pertinent labs & imaging results that were available during my care of the patient were reviewed by me and considered in my medical decision making (see chart for details).    MDM Rules/Calculators/A&P                          I have personally reviewed all imaging, labs and have interpreted them.  Patient presents with right knee pain.  She is alert, did not appear in acute distress, vital signs reassuring.  Will order x-ray for further evaluation.  X-ray shows moderate knee joint effusion previous tunnel ACL repair, someone in the lateral compartment which can be seen with discoid meniscus.  No medial or lateral compartment joint space narrowing.  I have low suspicion for septic arthritis as patient denies IV drug use, skin exam was performed no erythematous, edematous, warm joints noted on exam, no new heart murmur heard on exam.  Low suspicion for fracture or dislocation as x-ray does not feel any  significant findings. low suspicion for ligament or tendon damage as area was palpated no gross defects noted, they had full range of motion as well as 5/5 strength.  Low suspicion for compartment syndrome as area was palpated it was soft to the touch, neurovascular fully intact.  I suspect patient may have arthritis but I cannot fully rule out ligament or tendon damage.  Will start patient on NSAIDs, encouraged her to continue with PT and wearing her compression brace.  will have her follow-up with orthopedics for further evaluation.  Vital signs have remained stable, no indication for hospital  admission.   Patient given at home care as well strict return precautions.  Patient verbalized that they understood agreed to said plan.   Final Clinical Impression(s) / ED Diagnoses Final diagnoses:  Chronic pain of right knee    Rx / DC Orders ED Discharge Orders         Ordered    ibuprofen (ADVIL) 800 MG tablet  2 times daily        05/26/20 1614           Barnie Del 05/26/20 2104    Milagros Loll, MD 05/27/20 [phone]

## 2020-05-26 NOTE — ED Notes (Signed)
Pt ambulated to restroom with use of her cane without incident

## 2020-05-28 ENCOUNTER — Encounter (HOSPITAL_BASED_OUTPATIENT_CLINIC_OR_DEPARTMENT_OTHER): Payer: No Typology Code available for payment source | Admitting: Internal Medicine

## 2020-06-04 ENCOUNTER — Encounter: Payer: Self-pay | Admitting: Sports Medicine

## 2020-06-04 ENCOUNTER — Other Ambulatory Visit: Payer: Self-pay

## 2020-06-04 ENCOUNTER — Ambulatory Visit: Payer: No Typology Code available for payment source | Admitting: Sports Medicine

## 2020-06-04 DIAGNOSIS — M722 Plantar fascial fibromatosis: Secondary | ICD-10-CM

## 2020-06-04 DIAGNOSIS — L905 Scar conditions and fibrosis of skin: Secondary | ICD-10-CM

## 2020-06-04 DIAGNOSIS — M79671 Pain in right foot: Secondary | ICD-10-CM

## 2020-06-04 DIAGNOSIS — M7661 Achilles tendinitis, right leg: Secondary | ICD-10-CM

## 2020-06-04 DIAGNOSIS — G629 Polyneuropathy, unspecified: Secondary | ICD-10-CM

## 2020-06-04 DIAGNOSIS — M7731 Calcaneal spur, right foot: Secondary | ICD-10-CM

## 2020-06-04 DIAGNOSIS — R52 Pain, unspecified: Secondary | ICD-10-CM

## 2020-06-04 NOTE — Progress Notes (Signed)
Subjective: [name] [name] is a 54 y.o. female patient returns to office with complaint of moderate heel pain on the right and pain at back of heel with severe burning and tingling to both feet not sure if the Lyrica is helping because she is taking other medications but did have a bad night last night with burning and tingling.  Patient reports she is on low-dose of pain medicine that seems to help as well.  No other pedal complaints noted.  Patient Active Problem List   Diagnosis Date Noted  . OSA (obstructive sleep apnea) 04/01/2020  . Moderate persistent asthma with acute exacerbation 12/30/2019  . Chronic coughing 12/30/2019  . Seasonal allergies 12/30/2019  . Loud snoring 12/16/2019  . Prediabetes 01/04/2019  . Obesity (BMI 30-39.9) 01/04/2019  . Ganglion cyst 09/28/2018  . Carpal tunnel syndrome of right wrist 09/28/2018  . Chronic midline low back pain without sciatica 11/23/2017  . Essential hypertension 11/23/2017  . Chronic pain of right knee 11/23/2017  . 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 Marijuana abuse 11/23/2017  . Tobacco dependence 11/23/2017  . Alcohol abuse 07/30/2016  . S/P ACL reconstruction 01/31/2014  . DEPRESSION 11/17/2008  . ALLERGIC RHINITIS 11/17/2008  . GERD 11/17/2008    Current Outpatient Medications on File Prior to Visit  Medication Sig Dispense Refill  . acetaminophen (TYLENOL) 500 MG tablet Take 1 tablet (500 mg total) by mouth every 6 (six) hours as needed for up to 30 doses. 30 tablet 0  . albuterol (PROVENTIL) (2.5 MG/3ML) 0.083% nebulizer solution Take 3 mLs (2.5 mg total) by nebulization every 6 (six) hours as needed for wheezing or shortness of breath. 150 mL 1  . albuterol (VENTOLIN HFA) 108 (90 Base) MCG/ACT inhaler Inhale 2 puffs into the lungs every 6 (six) hours as needed for wheezing or shortness of breath. 18 g 2  . amLODipine (NORVASC) 10 MG tablet Take 1 tablet (10 mg total) by mouth daily. 30 tablet 6  . Ascorbic  Acid (VITAMIN C PO) Take 1 tablet by mouth daily.    Marland Kitchen atorvastatin (LIPITOR) 10 MG tablet Take 1 tablet (10 mg total) by mouth daily. 90 tablet 3  . baclofen (LIORESAL) 10 MG tablet Take 0.5-1 tablets (5-10 mg total) by mouth at bedtime as needed for muscle spasms. 30 each 3  . betamethasone dipropionate 0.05 % cream Apply topically 2 (two) times daily. 15 g 0  . celecoxib (CELEBREX) 200 MG capsule Take 1 capsule (200 mg total) by mouth daily. 30 capsule 6  . cetirizine (ZYRTEC) 10 MG tablet Take 1 tablet (10 mg total) by mouth daily. 30 tablet 11  . Cyanocobalamin (VITAMIN B-12 PO) Take 1 tablet by mouth daily.    . diclofenac (VOLTAREN) 75 MG EC tablet Take 1 tablet (75 mg total) by mouth 2 (two) times daily. 30 tablet 2  . Diclofenac Sodium 3 % GEL Apply 1 each topically 2 (two) times daily. 100 g 3  . DULoxetine (CYMBALTA) 30 MG capsule Take 1 capsule (30 mg total) by mouth daily. 30 capsule 3  . fluticasone (FLONASE) 50 MCG/ACT nasal spray Place 2 sprays into both nostrils daily. 16 g 2  . gabapentin (NEURONTIN) 300 MG capsule Take 3 capsules (900 mg total) by mouth at bedtime. 90 capsule 3  . hydrochlorothiazide (HYDRODIURIL) 25 MG tablet Take 1 tablet (25 mg total) by mouth daily. 30 tablet 6  . HYDROcodone-acetaminophen (NORCO) 10-325 MG tablet Take 1 tablet by mouth every 6 (six) hours  as needed. 30 tablet 0  . losartan (COZAAR) 50 MG tablet Take 1 tablet (50 mg total) by mouth daily. 30 tablet 3  . methocarbamol (ROBAXIN) 500 MG tablet Take 2 tablets (1,000 mg total) by mouth 4 (four) times daily. 90 tablet 0  . mometasone-formoterol (DULERA) 200-5 MCG/ACT AERO Inhale 2 puffs into the lungs 2 (two) times daily. 13 g 3  . Multiple Vitamin (MULTIVITAMIN) tablet Take 1 tablet by mouth daily.    . naltrexone (DEPADE) 50 MG tablet Take 1 tablet (50 mg total) by mouth daily. 30 tablet 0  . ondansetron (ZOFRAN ODT) 4 MG disintegrating tablet Take 1 tablet (4 mg total) by mouth every 8 (eight)  hours as needed for nausea or vomiting. 20 tablet 0  . pantoprazole (PROTONIX) 40 MG tablet Take 1 tablet (40 mg total) by mouth daily. 30 tablet 3  . polyethylene glycol powder (GLYCOLAX/MIRALAX) 17 GM/SCOOP powder Take 17 g by mouth daily as needed for mild constipation. 3350 g 1  . potassium chloride (KLOR-CON) 10 MEQ tablet Take 1 tablet (10 mEq total) by mouth daily. 30 tablet 2  . pregabalin (LYRICA) 75 MG capsule Take 1 capsule (75 mg total) by mouth 2 (two) times daily. 60 capsule 2  . Spacer/Aero-Holding Chambers (AEROCHAMBER MV) inhaler Use as instructed 1 each 0  . SUMAtriptan (IMITREX) 100 MG tablet Take 1 tablet earliest onset of migraine.  May repeat in 2 hours if headache persists or recurs.  Maximum 2 tablets in 24 hours. 10 tablet 3  . topiramate (TOPAMAX) 25 MG tablet Take 25 mg by mouth at bedtime.    . topiramate (TOPAMAX) 50 MG tablet Take 1 tablet (50 mg total) by mouth at bedtime. 30 tablet 3  . traMADol (ULTRAM) 50 MG tablet Take 1 tablet (50 mg total) by mouth every 8 (eight) hours as needed. 20 tablet 0  . triamcinolone cream (KENALOG) 0.1 % Apply 1 application topically 2 (two) times daily. 30 g 2   No current facility-administered medications on file prior to visit.    Allergies  Allergen Reactions  . Diphenhydramine Hcl Itching  . Metoprolol Palpitations   Social History   Socioeconomic History  . Marital status: Legally Separated    Spouse name: Not on file  . Number of children: Not on file  . Years of education: Not on file  . Highest education level: Not on file  Occupational History  . Not on file  Tobacco Use  . Smoking status: Current Every Day Smoker    Packs/day: 0.25    Years: 32.00    Pack years: 8.00    Types: Cigarettes  . Smokeless tobacco: Never Used  Vaping Use  . Vaping Use: Never used  Substance and Sexual Activity  . Alcohol use: Not Currently  . Drug use: Not Currently    Types: Marijuana    Comment: occasional  marijuana--   hx  "crack" use  last used 2004--  no treatment rehab (done on her own)  . Sexual activity: Not on file  Other Topics Concern  . Not on file  Social History Narrative  . Not on file   Social Determinants of Health   Financial Resource Strain:   . Difficulty of Paying Living Expenses: Not on file  Food Insecurity:   . Worried About Programme researcher, broadcasting/film/video in the Last Year: Not on file  . Ran Out of Food in the Last Year: Not on file  Transportation Needs:   .  Lack of Transportation (Medical): Not on file  . Lack of Transportation (Non-Medical): Not on file  Physical Activity:   . Days of Exercise per Week: Not on file  . Minutes of Exercise per Session: Not on file  Stress:   . Feeling of Stress : Not on file  Social Connections:   . Frequency of Communication with Friends and Family: Not on file  . Frequency of Social Gatherings with Friends and Family: Not on file  . Attends Religious Services: Not on file  . Active Member of Clubs or Organizations: Not on file  . Attends Banker Meetings: Not on file  . Marital Status: Not on file    Family History  Problem Relation Age of Onset  . Hypertension Father   . Breast cancer Maternal Aunt     Past Surgical History:  Procedure Laterality Date  . ABDOMINAL HYSTERECTOMY    . BUNIONECTOMY Left 2011  . HEEL SPUR SURGERY Right 2012  . KNEE ARTHROSCOPY WITH ANTERIOR CRUCIATE LIGAMENT (ACL) REPAIR WITH HAMSTRING GRAFT Right 01/31/2014   Procedure: RIGHT KNEE ARTHROSCOPY WITH ALLOGRAFT (ACL) ANTERIOR CRUCIATE LIGAMENT RECONSTRUCTON PARTIAL MENISCECTOMY VERSES REPAIR ;  Surgeon: Eugenia Mcalpine, MD;  Location: [facility];  Service: Orthopedics;  Laterality: Right;  . TOTAL ABDOMINAL HYSTERECTOMY W/ BILATERAL SALPINGOOPHORECTOMY  04-20-2006    Objective: Physical Exam General: The patient is alert and oriented x3 in no acute distress.  Dermatology: Skin is warm, dry and supple bilateral lower extremities.  Nails 1-10 are normal. There is no erythema, edema, no eccymosis, no open lesions present. Integument is otherwise unremarkable.  Old scar to right posterior heel with central painful raised area no redness no warmth no active drainage no other acute signs of infection.  Vascular: Dorsalis Pedis pulse and Posterior Tibial pulse are 2/4 bilateral. Capillary fill time is immediate to all digits.  Neurological: Grossly intact to light touch with subjective burning bilateral.  Musculoskeletal: Tenderness to palpation at the posterior scar at the right heel and medial calcaneal tubercale and through the insertion of the plantar fascia on the right and subjective pain to both ankles.  Admits to also worsening right knee pain and swelling like previous.  Strength 5/5 in all groups bilateral.   Assessment and Plan: Problem List Items Addressed This Visit    None    Visit Diagnoses    Scar painful    -  Primary   Achilles tendinitis, right leg       Plantar fasciitis       Heel spur, right       Right foot pain       Neuropathy          -Complete examination performed.  -Re-Discussed with patient in detail the condition of plantar fasciitis and residual tendinitis pain at scar at right heel from previous surgery and bilateral neuropathy, how this occurs and general treatment options. Explained both conservative and surgical treatments.  -Patient opt for surgical management. Consent obtained for excision of painful scar at right posterior heel and plantar fascial release right foot.  Pre and Post op course explained. Risks, benefits, alternatives explained. No guarantees given or implied. Surgical booking slip submitted and provided patient with Surgical packet and info for [facility] surgical center -Patient to use crutches or knee scooter for 2 weeks to keep pressure off the right foot -Continue with Lyrica for neuropathy -Return to office after surgery Asencion Islam, DPM

## 2020-06-05 ENCOUNTER — Encounter (HOSPITAL_BASED_OUTPATIENT_CLINIC_OR_DEPARTMENT_OTHER): Payer: Self-pay | Admitting: Sports Medicine

## 2020-06-09 ENCOUNTER — Other Ambulatory Visit: Payer: Self-pay

## 2020-06-09 ENCOUNTER — Encounter (HOSPITAL_BASED_OUTPATIENT_CLINIC_OR_DEPARTMENT_OTHER): Payer: Self-pay | Admitting: Sports Medicine

## 2020-06-09 NOTE — Progress Notes (Signed)
Spoke w/ via phone for pre-op interview--- PT Lab needs dos---- Istat              Lab results------ current ekg in epic/ chart COVID test ------ 06-12-2020 @ 0845 Arrive at ------- 0930 NPO after MN NO Solid Food.  Clear liquids from MN until--- 0830 Medications to take morning of surgery ----- Lyrica, Cymbalta, Norvasc, Robaxin, Protonix, Flonase spray Diabetic medication ----- n/a Patient Special Instructions ----- pt to do albuterol nebulizer night before surgery and if needed do morning of surgery and bring rescue inhaler dos Pre-Op special Istructions -----  Pre-op orders pending,  Requested in epic via inbox message to dr stover.  Pt's pcp H&P still pending. Patient verbalized understanding of instructions that were given at this phone interview. Patient denies shortness of breath, chest pain, fever, cough at this phone interview.   Anesthesia Review:  HTN,  Asthma, moderate persistant,  Alcohol use disorder (pt stated last alcohol 06-06-2020 and will abstain until after surgery, pt is not take depade as prescribed due to finances),  Mild OSA (per pt has not gone back after first study 06/ 2021 to get titrated for cpap due to pain),  Pt has intermittant sob and chronic cough post covid 10/ 2020.    PCP: Dr Jonah Blue (lov 05-14-2020) Cardiologist : no Chest x-ray : 12-19-2019 epic EKG : 12-19-2019 epic Activity level:   intermitant sob with activities  Sleep Study/ CPAP :  YES/ NO Fasting Blood Sugar :      / Checks Blood Sugar -- times a day:   Does not check Blood Thinner/ Instructions /Last Dose:  NO ASA / Instructions/ Last Dose :  NO

## 2020-06-10 ENCOUNTER — Encounter: Payer: Self-pay | Admitting: Internal Medicine

## 2020-06-10 ENCOUNTER — Ambulatory Visit: Payer: Medicaid Other | Attending: Internal Medicine | Admitting: Internal Medicine

## 2020-06-10 DIAGNOSIS — I10 Essential (primary) hypertension: Secondary | ICD-10-CM

## 2020-06-10 DIAGNOSIS — M272 Inflammatory conditions of jaws: Secondary | ICD-10-CM

## 2020-06-10 DIAGNOSIS — F1210 Cannabis abuse, uncomplicated: Secondary | ICD-10-CM

## 2020-06-10 DIAGNOSIS — G4733 Obstructive sleep apnea (adult) (pediatric): Secondary | ICD-10-CM

## 2020-06-10 MED ORDER — LISINOPRIL-HYDROCHLOROTHIAZIDE 20-25 MG PO TABS: 1.0000 | ORAL_TABLET | Freq: Every day | ORAL | 3 refills | Status: DC

## 2020-06-10 MED ORDER — AMOXICILLIN-POT CLAVULANATE 875-125 MG PO TABS: 1.0000 | ORAL_TABLET | Freq: Two times a day (BID) | ORAL | 0 refills | Status: AC

## 2020-06-10 NOTE — Assessment & Plan Note (Addendum)
Blood pressure is too high When I rechecked it bp was 170/100. I will change meds.  Change hctz to lisinopril hctz.  Side effects discussed.   BP should be 140/90 or less for surgery. She will monitor at home.  Recent home bps 145/95.

## 2020-06-10 NOTE — Assessment & Plan Note (Signed)
This is well controlled and anesthesia should make appropriate accomodations for surgery

## 2020-06-10 NOTE — Progress Notes (Signed)
Patient here for preop clearance prior to foot surgery. She has a chronic scar on right foot and also has a plantar fasciitis.   In addition she has mouth pain x 1 week. She thinks infected.   She is able to walk up three flights of stairs without difficulty and can walk > 1 mile without difficulty. She does have a hx of bronchospasm but that is well controlled with inhalers.   She does smoke but has quit in preparation of surgery.   Past Medical History:  Diagnosis Date  . Alcohol use disorder, moderate, dependence (HCC)    06-09-2020  per pt not taking naltrexone as prescribed since august 2021, unable to afford it  . Allergic rhinitis, seasonal   . Bipolar 1 disorder (HCC)   . Chronic constipation   . Chronic low back pain   . Chronic pain of right knee   . DDD (degenerative disc disease)   . GERD (gastroesophageal reflux disease)   . History of 2019 novel coronavirus disease (COVID-19) 05/2019   06-09-2020  per pt residual chronic cough and intermittant sob  . History of gastric ulcer   . Hyperlipidemia   . Hypertension   . MDD (major depressive disorder)   . Migraine   . Mild obstructive sleep apnea    sleep study in epic 01-22-2020, recommended pt to have cpap titrate ,  per pt due to pain have not gone back   . Moderate persistent asthma    followed by pcp  . Osteoarthritis   . Post-COVID chronic cough   . Pre-diabetes   . Sciatica   . SOB (shortness of breath)    06-09-2020  per pt intermittant due to residual from covid   . Wears glasses   . Wears glasses     Social History   Socioeconomic History  . Marital status: Legally Separated    Spouse name: Not on file  . Number of children: Not on file  . Years of education: Not on file  . Highest education level: Not on file  Occupational History  . Not on file  Tobacco Use  . Smoking status: Current Every Day Smoker    Packs/day: 0.25    Years: 32.00    Pack years: 8.00    Types: Cigarettes  . Smokeless  tobacco: Never Used  . Tobacco comment: per pt down to 6 cig per day  Vaping Use  . Vaping Use: Never used  Substance and Sexual Activity  . Alcohol use: Yes    Comment: alcohol use disorder;  06-09-2020  per pt last drank alcohol 06-06-2020  . Drug use: Not Currently    Types: Marijuana    Comment: occasional  marijuana--  hx  "crack" use  last used 2004--  no treatment rehab (done on her own)  . Sexual activity: Not on file  Other Topics Concern  . Not on file  Social History Narrative  . Not on file   Social Determinants of Health   Financial Resource Strain:   . Difficulty of Paying Living Expenses: Not on file  Food Insecurity:   . Worried About Programme researcher, broadcasting/film/video in the Last Year: Not on file  . Ran Out of Food in the Last Year: Not on file  Transportation Needs:   . Lack of Transportation (Medical): Not on file  . Lack of Transportation (Non-Medical): Not on file  Physical Activity:   . Days of Exercise per Week: Not on file  . Minutes  of Exercise per Session: Not on file  Stress:   . Feeling of Stress : Not on file  Social Connections:   . Frequency of Communication with Friends and Family: Not on file  . Frequency of Social Gatherings with Friends and Family: Not on file  . Attends Religious Services: Not on file  . Active Member of Clubs or Organizations: Not on file  . Attends Banker Meetings: Not on file  . Marital Status: Not on file  Intimate Partner Violence:   . Fear of Current or Ex-Partner: Not on file  . Emotionally Abused: Not on file  . Physically Abused: Not on file  . Sexually Abused: Not on file    Past Surgical History:  Procedure Laterality Date  . BUNIONECTOMY Left 2011  . HEEL SPUR SURGERY Right 2012  . KNEE ARTHROSCOPY WITH ANTERIOR CRUCIATE LIGAMENT (ACL) REPAIR WITH HAMSTRING GRAFT Right 01/31/2014   Procedure: RIGHT KNEE ARTHROSCOPY WITH ALLOGRAFT (ACL) ANTERIOR CRUCIATE LIGAMENT RECONSTRUCTON PARTIAL MENISCECTOMY VERSES  REPAIR ;  Surgeon: Eugenia Mcalpine, MD;  Location: [facility];  Service: Orthopedics;  Laterality: Right;  . TOTAL ABDOMINAL HYSTERECTOMY W/ BILATERAL SALPINGOOPHORECTOMY  04-20-2006    Family History  Problem Relation Age of Onset  . Hypertension Father   . Breast cancer Maternal Aunt     Allergies  Allergen Reactions  . Diphenhydramine Hcl Itching  . Metoprolol Palpitations    Current Outpatient Medications on File Prior to Visit  Medication Sig Dispense Refill  . acetaminophen (TYLENOL) 500 MG tablet Take 1 tablet (500 mg total) by mouth every 6 (six) hours as needed for up to 30 doses. 30 tablet 0  . albuterol (PROVENTIL) (2.5 MG/3ML) 0.083% nebulizer solution Take 3 mLs (2.5 mg total) by nebulization every 6 (six) hours as needed for wheezing or shortness of breath. 150 mL 1  . albuterol (VENTOLIN HFA) 108 (90 Base) MCG/ACT inhaler Inhale 2 puffs into the lungs every 6 (six) hours as needed for wheezing or shortness of breath. (Patient taking differently: Inhale 2 puffs into the lungs every 6 (six) hours as needed for wheezing or shortness of breath. ) 18 g 2  . amLODipine (NORVASC) 10 MG tablet Take 1 tablet (10 mg total) by mouth daily. (Patient taking differently: Take 10 mg by mouth daily. ) 30 tablet 6  . atorvastatin (LIPITOR) 10 MG tablet Take 1 tablet (10 mg total) by mouth daily. 90 tablet 3  . Ascorbic Acid (VITAMIN C PO) Take 1 tablet by mouth daily.    . DULoxetine (CYMBALTA) 30 MG capsule Take 1 capsule (30 mg total) by mouth daily. (Patient taking differently: Take 30 mg by mouth daily. ) 30 capsule 3  . fluticasone (FLONASE) 50 MCG/ACT nasal spray Place 2 sprays into both nostrils daily. (Patient taking differently: Place 2 sprays into both nostrils daily. ) 16 g 2  . hydrochlorothiazide (HYDRODIURIL) 25 MG tablet Take 1 tablet (25 mg total) by mouth daily. (Patient taking differently: Take 25 mg by mouth daily. ) 30 tablet 6  . losartan (COZAAR) 50 MG  tablet Take 1 tablet (50 mg total) by mouth daily. (Patient taking differently: Take 50 mg by mouth daily. ) 30 tablet 3  . Multiple Vitamin (MULTIVITAMIN) tablet Take 1 tablet by mouth daily.    . pantoprazole (PROTONIX) 40 MG tablet Take 1 tablet (40 mg total) by mouth daily. (Patient taking differently: Take 40 mg by mouth daily. ) 30 tablet 3  . Spacer/Aero-Holding Chambers (AEROCHAMBER  MV) inhaler Use as instructed 1 each 0  . SUMAtriptan (IMITREX) 100 MG tablet Take 1 tablet earliest onset of migraine.  May repeat in 2 hours if headache persists or recurs.  Maximum 2 tablets in 24 hours. (Patient taking differently: Take 100 mg by mouth every 2 (two) hours as needed. Take 1 tablet earliest onset of migraine.  May repeat in 2 hours if headache persists or recurs.  Maximum 2 tablets in 24 hours.) 10 tablet 3   No current facility-administered medications on file prior to visit.     patient denies chest pain, shortness of breath, orthopnea. Denies lower extremity edema, abdominal pain, change in appetite, change in bowel movements. Patient denies rashes, musculoskeletal complaints. No other specific complaints in a complete review of systems.   BP (!) 167/127   Pulse 84   Resp 16   Wt 193 lb (87.5 kg)   SpO2 95%   BMI 33.13 kg/m   Well-developed well-nourished female in no acute distress. HEENT exam atraumatic, normocephalic, extraocular muscles are intact.  She has an irritated gum lower left with painful molar to palpation. Neck is supple. No jugular venous distention no thyromegaly. No lymphadenoathy.Chest clear to auscultation without increased work of breathing. Cardiac exam S1 and S2 are regular. Abdominal exam active bowel sounds, soft, nontender. Extremities no edema she does have a hypertrophic area over right calcaneous. Neurologic exam she is alert without any motor sensory deficits. Gait is normal.  A/P preop clearance.  She has acceptable risk for surgery.  Please review note  for OSA and HTN  Essential hypertension Blood pressure is too high When I rechecked it bp was 170/100. I will change meds.  Change hctz to lisinopril hctz.  Side effects discussed.   BP should be 140/90 or less for surgery. She will monitor at home.  Recent home bps 145/95.   OSA (obstructive sleep apnea) This is well controlled and anesthesia should make appropriate accomodations for surgery  Marijuana abuse She has quit prior to surgery  Odontogenic infection of jaw I suspect she has an infection and needs definitive treatment eventually.   Because surgery is scheduled, I think it is best to treat prophylactically with amox/clav for 5 days

## 2020-06-10 NOTE — Assessment & Plan Note (Signed)
She has quit prior to surgery

## 2020-06-10 NOTE — Assessment & Plan Note (Signed)
I suspect she has an infection and needs definitive treatment eventually.   Because surgery is scheduled, I think it is best to treat prophylactically with amox/clav for 5 days

## 2020-06-11 ENCOUNTER — Other Ambulatory Visit (HOSPITAL_COMMUNITY)
Admission: RE | Admit: 2020-06-11 | Discharge: 2020-06-11 | Disposition: A | Discharge status: Home / Self Care | Payer: Medicaid Other | Source: Ambulatory Visit | Attending: Sports Medicine | Admitting: Sports Medicine

## 2020-06-11 DIAGNOSIS — Z01818 Encounter for other preprocedural examination: Secondary | ICD-10-CM | POA: Insufficient documentation

## 2020-06-11 DIAGNOSIS — Z20822 Contact with and (suspected) exposure to covid-19: Secondary | ICD-10-CM | POA: Diagnosis not present

## 2020-06-11 LAB — SARS CORONAVIRUS 2 (TAT 6-24 HRS): SARS Coronavirus 2: NEGATIVE

## 2020-06-12 ENCOUNTER — Ambulatory Visit: Payer: No Typology Code available for payment source | Admitting: Rehabilitative and Restorative Service Providers"

## 2020-06-12 NOTE — Anesthesia Preprocedure Evaluation (Addendum)
Anesthesia Evaluation  Patient identified by MRN, date of birth, ID band Patient awake    Reviewed: Allergy & Precautions, NPO status , Patient's Chart, lab work & pertinent test results  Airway Mallampati: II  TM Distance: >3 FB Neck ROM: Full    Dental no notable dental hx. (+) Teeth Intact, Dental Advisory Given,    Pulmonary asthma , sleep apnea and Continuous Positive Airway Pressure Ventilation , Current Smoker and Patient abstained from smoking.,    Pulmonary exam normal breath sounds clear to auscultation       Cardiovascular Exercise Tolerance: Good hypertension, Pt. on medications Normal cardiovascular exam Rhythm:Regular Rate:Normal     Neuro/Psych  Headaches, Bipolar Disorder    GI/Hepatic Neg liver ROS, GERD  Medicated,  Endo/Other  negative endocrine ROS  Renal/GU      Musculoskeletal  (+) Arthritis ,   Abdominal (+) + obese,   Peds  Hematology   Anesthesia Other Findings   Reproductive/Obstetrics                            Anesthesia Physical Anesthesia Plan  ASA: III  Anesthesia Plan: General   Post-op Pain Management:    Induction: Intravenous  PONV Risk Score and Plan: 3 and Treatment may vary due to age or medical condition, Dexamethasone and Midazolam  Airway Management Planned: Oral ETT  Additional Equipment: None  Intra-op Plan:   Post-operative Plan:   Informed Consent: I have reviewed the patients History and Physical, chart, labs and discussed the procedure including the risks, benefits and alternatives for the proposed anesthesia with the patient or authorized representative who has indicated his/her understanding and acceptance.     Dental advisory given  Plan Discussed with:   Anesthesia Plan Comments: (GA pt prone)       Anesthesia Quick Evaluation

## 2020-06-12 NOTE — Progress Notes (Addendum)
H and p /medical clearance dr swords 06-10-2020 on chart/epic

## 2020-06-15 ENCOUNTER — Other Ambulatory Visit: Payer: Self-pay

## 2020-06-15 ENCOUNTER — Ambulatory Visit (HOSPITAL_BASED_OUTPATIENT_CLINIC_OR_DEPARTMENT_OTHER): Payer: Self-pay | Admitting: Anesthesiology

## 2020-06-15 ENCOUNTER — Encounter (HOSPITAL_BASED_OUTPATIENT_CLINIC_OR_DEPARTMENT_OTHER): Payer: Self-pay | Admitting: Sports Medicine

## 2020-06-15 ENCOUNTER — Ambulatory Visit (HOSPITAL_BASED_OUTPATIENT_CLINIC_OR_DEPARTMENT_OTHER)
Admission: RE | Admit: 2020-06-15 | Discharge: 2020-06-15 | Disposition: A | Discharge status: Home / Self Care | Payer: Self-pay | Attending: Sports Medicine | Admitting: Sports Medicine

## 2020-06-15 ENCOUNTER — Encounter (HOSPITAL_BASED_OUTPATIENT_CLINIC_OR_DEPARTMENT_OTHER)
Admission: RE | Disposition: A | Discharge status: Home / Self Care | Payer: Self-pay | Source: Home / Self Care | Attending: Sports Medicine

## 2020-06-15 DIAGNOSIS — Z888 Allergy status to other drugs, medicaments and biological substances status: Secondary | ICD-10-CM | POA: Insufficient documentation

## 2020-06-15 DIAGNOSIS — R52 Pain, unspecified: Secondary | ICD-10-CM

## 2020-06-15 DIAGNOSIS — E669 Obesity, unspecified: Secondary | ICD-10-CM | POA: Insufficient documentation

## 2020-06-15 DIAGNOSIS — Z803 Family history of malignant neoplasm of breast: Secondary | ICD-10-CM | POA: Insufficient documentation

## 2020-06-15 DIAGNOSIS — M722 Plantar fascial fibromatosis: Secondary | ICD-10-CM | POA: Insufficient documentation

## 2020-06-15 DIAGNOSIS — J4541 Moderate persistent asthma with (acute) exacerbation: Secondary | ICD-10-CM | POA: Insufficient documentation

## 2020-06-15 DIAGNOSIS — Z79899 Other long term (current) drug therapy: Secondary | ICD-10-CM | POA: Insufficient documentation

## 2020-06-15 DIAGNOSIS — L905 Scar conditions and fibrosis of skin: Secondary | ICD-10-CM | POA: Insufficient documentation

## 2020-06-15 DIAGNOSIS — Z8249 Family history of ischemic heart disease and other diseases of the circulatory system: Secondary | ICD-10-CM | POA: Insufficient documentation

## 2020-06-15 DIAGNOSIS — M79671 Pain in right foot: Secondary | ICD-10-CM | POA: Insufficient documentation

## 2020-06-15 DIAGNOSIS — Z7951 Long term (current) use of inhaled steroids: Secondary | ICD-10-CM | POA: Insufficient documentation

## 2020-06-15 DIAGNOSIS — F17210 Nicotine dependence, cigarettes, uncomplicated: Secondary | ICD-10-CM | POA: Insufficient documentation

## 2020-06-15 DIAGNOSIS — R059 Cough, unspecified: Secondary | ICD-10-CM | POA: Insufficient documentation

## 2020-06-15 DIAGNOSIS — Z6832 Body mass index (BMI) 32.0-32.9, adult: Secondary | ICD-10-CM | POA: Insufficient documentation

## 2020-06-15 DIAGNOSIS — I10 Essential (primary) hypertension: Secondary | ICD-10-CM | POA: Insufficient documentation

## 2020-06-15 DIAGNOSIS — R7303 Prediabetes: Secondary | ICD-10-CM | POA: Insufficient documentation

## 2020-06-15 DIAGNOSIS — U099 Post covid-19 condition, unspecified: Secondary | ICD-10-CM | POA: Insufficient documentation

## 2020-06-15 HISTORY — DX: Obstructive sleep apnea (adult) (pediatric): G47.33

## 2020-06-15 HISTORY — DX: Post covid-19 condition, unspecified: U09.9

## 2020-06-15 HISTORY — DX: Prediabetes: R73.03

## 2020-06-15 HISTORY — DX: Major depressive disorder, single episode, unspecified: F32.9

## 2020-06-15 HISTORY — DX: Alcohol dependence, uncomplicated: F10.20

## 2020-06-15 HISTORY — DX: Chronic cough: R05.3

## 2020-06-15 HISTORY — DX: Other chronic pain: G89.29

## 2020-06-15 HISTORY — PX: PLANTAR FASCIA RELEASE: SHX2239

## 2020-06-15 HISTORY — DX: Low back pain, unspecified: M54.50

## 2020-06-15 HISTORY — DX: Other constipation: K59.09

## 2020-06-15 HISTORY — DX: Shortness of breath: R06.02

## 2020-06-15 HISTORY — DX: Other seasonal allergic rhinitis: J30.2

## 2020-06-15 HISTORY — DX: Moderate persistent asthma, uncomplicated: J45.40

## 2020-06-15 LAB — POCT I-STAT, CHEM 8
BUN: 18 mg/dL (ref 6–20)
Calcium, Ion: 1.17 mmol/L (ref 1.15–1.40)
Chloride: 100 mmol/L (ref 98–111)
Creatinine, Ser: 0.9 mg/dL (ref 0.44–1.00)
Glucose, Bld: 107 mg/dL — ABNORMAL HIGH (ref 70–99)
HCT: 41 % (ref 36.0–46.0)
Hemoglobin: 13.9 g/dL (ref 12.0–15.0)
Potassium: 3.5 mmol/L (ref 3.5–5.1)
Sodium: 139 mmol/L (ref 135–145)
TCO2: 23 mmol/L (ref 22–32)

## 2020-06-15 SURGERY — FASCIOTOMY, PLANTAR, ENDOSCOPIC
Anesthesia: General | Site: Foot | Laterality: Right

## 2020-06-15 MED ORDER — ACETAMINOPHEN 10 MG/ML IV SOLN: 1000.0000 mg | Freq: Once | INTRAVENOUS | Status: DC | PRN

## 2020-06-15 MED ORDER — CEFAZOLIN SODIUM-DEXTROSE 2-4 GM/100ML-% IV SOLN
2.0000 g | INTRAVENOUS | Status: AC
  Administered 2020-06-15: 2 g via INTRAVENOUS

## 2020-06-15 MED ORDER — SUGAMMADEX SODIUM 200 MG/2ML IV SOLN
INTRAVENOUS | Status: DC | PRN
  Administered 2020-06-15: 200 mg via INTRAVENOUS

## 2020-06-15 MED ORDER — PROPOFOL 10 MG/ML IV BOLUS
INTRAVENOUS | Status: DC | PRN
  Administered 2020-06-15: 170 mg via INTRAVENOUS

## 2020-06-15 MED ORDER — ONDANSETRON HCL 4 MG/2ML IJ SOLN
INTRAMUSCULAR | Status: DC | PRN
  Administered 2020-06-15 (×2): 4 mg via INTRAVENOUS

## 2020-06-15 MED ORDER — EPHEDRINE SULFATE-NACL 50-0.9 MG/10ML-% IV SOSY
PREFILLED_SYRINGE | INTRAVENOUS | Status: DC | PRN
  Administered 2020-06-15 (×3): 10 mg via INTRAVENOUS
  Administered 2020-06-15: 5 mg via INTRAVENOUS
  Administered 2020-06-15: 15 mg via INTRAVENOUS

## 2020-06-15 MED ORDER — DOCUSATE SODIUM 100 MG PO CAPS: 100.0000 mg | ORAL_CAPSULE | Freq: Every day | ORAL | 2 refills | Status: AC | PRN

## 2020-06-15 MED ORDER — METOCLOPRAMIDE HCL 5 MG/ML IJ SOLN
INTRAMUSCULAR | Status: DC | PRN
  Administered 2020-06-15: 10 mg via INTRAVENOUS

## 2020-06-15 MED ORDER — PHENYLEPHRINE 40 MCG/ML (10ML) SYRINGE FOR IV PUSH (FOR BLOOD PRESSURE SUPPORT)
PREFILLED_SYRINGE | INTRAVENOUS | Status: DC | PRN
  Administered 2020-06-15 (×2): 120 ug via INTRAVENOUS
  Administered 2020-06-15: 80 ug via INTRAVENOUS

## 2020-06-15 MED ORDER — HYDROCODONE-ACETAMINOPHEN 7.5-325 MG PO TABS
ORAL_TABLET | ORAL | Status: AC
  Filled 2020-06-15: qty 1

## 2020-06-15 MED ORDER — LIDOCAINE HCL 1 % IJ SOLN
INTRAMUSCULAR | Status: DC | PRN
  Administered 2020-06-15 (×2): 10 mL

## 2020-06-15 MED ORDER — HYDROCODONE-ACETAMINOPHEN 7.5-325 MG PO TABS
1.0000 | ORAL_TABLET | Freq: Once | ORAL | Status: AC | PRN
  Administered 2020-06-15: 1 via ORAL

## 2020-06-15 MED ORDER — ROCURONIUM BROMIDE 10 MG/ML (PF) SYRINGE
PREFILLED_SYRINGE | INTRAVENOUS | Status: DC | PRN
  Administered 2020-06-15: 60 mg via INTRAVENOUS

## 2020-06-15 MED ORDER — HYDROMORPHONE HCL 1 MG/ML IJ SOLN: 0.2500 mg | INTRAMUSCULAR | Status: DC | PRN

## 2020-06-15 MED ORDER — CEFAZOLIN SODIUM-DEXTROSE 2-4 GM/100ML-% IV SOLN
INTRAVENOUS | Status: AC
  Filled 2020-06-15: qty 100

## 2020-06-15 MED ORDER — PROPOFOL 10 MG/ML IV BOLUS
INTRAVENOUS | Status: AC
  Filled 2020-06-15: qty 20

## 2020-06-15 MED ORDER — FENTANYL CITRATE (PF) 100 MCG/2ML IJ SOLN
INTRAMUSCULAR | Status: AC
  Filled 2020-06-15: qty 2

## 2020-06-15 MED ORDER — DEXMEDETOMIDINE (PRECEDEX) IN NS 20 MCG/5ML (4 MCG/ML) IV SYRINGE
PREFILLED_SYRINGE | INTRAVENOUS | Status: DC | PRN
  Administered 2020-06-15: 8 ug via INTRAVENOUS

## 2020-06-15 MED ORDER — MEPERIDINE HCL 25 MG/ML IJ SOLN: 6.2500 mg | INTRAMUSCULAR | Status: DC | PRN

## 2020-06-15 MED ORDER — DEXAMETHASONE SODIUM PHOSPHATE 10 MG/ML IJ SOLN
INTRAMUSCULAR | Status: DC | PRN
  Administered 2020-06-15: 10 mg via INTRAVENOUS

## 2020-06-15 MED ORDER — IBUPROFEN 800 MG PO TABS: 800.0000 mg | ORAL_TABLET | Freq: Four times a day (QID) | ORAL | 0 refills | Status: DC | PRN

## 2020-06-15 MED ORDER — CHLORHEXIDINE GLUCONATE CLOTH 2 % EX PADS: 6.0000 | MEDICATED_PAD | Freq: Once | CUTANEOUS | Status: DC

## 2020-06-15 MED ORDER — PROMETHAZINE HCL 12.5 MG PO TABS: 12.5000 mg | ORAL_TABLET | Freq: Four times a day (QID) | ORAL | 0 refills | Status: DC | PRN

## 2020-06-15 MED ORDER — MIDAZOLAM HCL 5 MG/5ML IJ SOLN
INTRAMUSCULAR | Status: DC | PRN
  Administered 2020-06-15: 2 mg via INTRAVENOUS

## 2020-06-15 MED ORDER — LACTATED RINGERS IV SOLN: INTRAVENOUS | Status: DC

## 2020-06-15 MED ORDER — OXYCODONE-ACETAMINOPHEN 10-325 MG PO TABS
1.0000 | ORAL_TABLET | Freq: Four times a day (QID) | ORAL | 0 refills | Status: AC | PRN
Start: 2020-06-15 — End: 2020-06-22

## 2020-06-15 MED ORDER — LIDOCAINE 2% (20 MG/ML) 5 ML SYRINGE
INTRAMUSCULAR | Status: DC | PRN
  Administered 2020-06-15: 100 mg via INTRAVENOUS

## 2020-06-15 MED ORDER — FENTANYL CITRATE (PF) 250 MCG/5ML IJ SOLN
INTRAMUSCULAR | Status: DC | PRN
  Administered 2020-06-15: 100 ug via INTRAVENOUS

## 2020-06-15 MED ORDER — MIDAZOLAM HCL 2 MG/2ML IJ SOLN
INTRAMUSCULAR | Status: AC
  Filled 2020-06-15: qty 2

## 2020-06-15 MED ORDER — PROMETHAZINE HCL 25 MG/ML IJ SOLN: 6.2500 mg | INTRAMUSCULAR | Status: DC | PRN

## 2020-06-15 SURGICAL SUPPLY — 61 items
APL SWBSTK 6 STRL LF DISP (MISCELLANEOUS) ×4
APPLICATOR COTTON TIP 6 STRL (MISCELLANEOUS) ×4 IMPLANT
APPLICATOR COTTON TIP 6IN STRL (MISCELLANEOUS) ×12
BLADE HOOK ENDO STRL (BLADE) IMPLANT
BLADE SURG 15 STRL LF DISP TIS (BLADE) ×2 IMPLANT
BLADE SURG 15 STRL SS (BLADE) ×6
BLADE TRIANGLE EPF/EGR ENDO (BLADE) ×3 IMPLANT
BNDG CMPR 9X4 STRL LF SNTH (GAUZE/BANDAGES/DRESSINGS) ×1
BNDG COHESIVE 3X5 TAN STRL LF (GAUZE/BANDAGES/DRESSINGS) ×3 IMPLANT
BNDG CONFORM 2 STRL LF (GAUZE/BANDAGES/DRESSINGS) ×3 IMPLANT
BNDG ELASTIC 3X5.8 VLCR STR LF (GAUZE/BANDAGES/DRESSINGS) ×3 IMPLANT
BNDG ESMARK 4X9 LF (GAUZE/BANDAGES/DRESSINGS) ×3 IMPLANT
BNDG GAUZE ELAST 4 BULKY (GAUZE/BANDAGES/DRESSINGS) ×3 IMPLANT
CLOSURE STERI-STRIP 1/2X4 (GAUZE/BANDAGES/DRESSINGS) ×1
CLSR STERI-STRIP ANTIMIC 1/2X4 (GAUZE/BANDAGES/DRESSINGS) ×2 IMPLANT
COVER BACK TABLE 60X90IN (DRAPES) ×3 IMPLANT
COVER WAND RF STERILE (DRAPES) ×3 IMPLANT
CUFF TOURN SGL QUICK 18X4 (TOURNIQUET CUFF) ×3 IMPLANT
DRAPE EXTREMITY T 121X128X90 (DISPOSABLE) ×3 IMPLANT
DRAPE IMP U-DRAPE 54X76 (DRAPES) ×3 IMPLANT
DRAPE U-SHAPE 47X51 STRL (DRAPES) ×3 IMPLANT
DRSG EMULSION OIL 3X3 NADH (GAUZE/BANDAGES/DRESSINGS) ×3 IMPLANT
DURAPREP 26ML APPLICATOR (WOUND CARE) ×3 IMPLANT
ELECT REM PT RETURN 9FT ADLT (ELECTROSURGICAL) ×3
ELECTRODE REM PT RTRN 9FT ADLT (ELECTROSURGICAL) ×1 IMPLANT
GAUZE 4X4 16PLY RFD (DISPOSABLE) IMPLANT
GAUZE SPONGE 4X4 12PLY STRL (GAUZE/BANDAGES/DRESSINGS) ×3 IMPLANT
GLOVE BIO SURGEON STRL SZ 6.5 (GLOVE) ×2 IMPLANT
GLOVE BIO SURGEONS STRL SZ 6.5 (GLOVE) ×1
GLOVE BIOGEL PI IND STRL 6.5 (GLOVE) ×2 IMPLANT
GLOVE BIOGEL PI INDICATOR 6.5 (GLOVE) ×4
GOWN STRL REUS W/ TWL LRG LVL3 (GOWN DISPOSABLE) ×1 IMPLANT
GOWN STRL REUS W/TWL LRG LVL3 (GOWN DISPOSABLE) ×6 IMPLANT
MARKER SKIN DUAL TIP RULER LAB (MISCELLANEOUS) ×3 IMPLANT
NDL SAFETY ECLIPSE 18X1.5 (NEEDLE) IMPLANT
NEEDLE HYPO 18GX1.5 SHARP (NEEDLE)
NEEDLE HYPO 25X1 1.5 SAFETY (NEEDLE) ×6 IMPLANT
NS IRRIG 1000ML POUR BTL (IV SOLUTION) ×3 IMPLANT
PACK BASIN DAY SURGERY FS (CUSTOM PROCEDURE TRAY) ×3 IMPLANT
PADDING CAST ABS 4INX4YD NS (CAST SUPPLIES) ×2
PADDING CAST ABS COTTON 4X4 ST (CAST SUPPLIES) ×1 IMPLANT
PENCIL SMOKE EVACUATOR (MISCELLANEOUS) ×3 IMPLANT
SLEEVE SCD COMPRESS KNEE MED (MISCELLANEOUS) ×3 IMPLANT
STAPLER VISISTAT (STAPLE) ×3 IMPLANT
STOCKINETTE 6  STRL (DRAPES) ×3
STOCKINETTE 6 STRL (DRAPES) ×1 IMPLANT
SUT ETHILON 3 0 PS 1 (SUTURE) IMPLANT
SUT ETHILON 5 0 PS 2 18 (SUTURE) ×3 IMPLANT
SUT MERSILENE 2.0 SH NDLE (SUTURE) ×3 IMPLANT
SUT MNCRL AB 3-0 PS2 18 (SUTURE) IMPLANT
SUT MNCRL AB 4-0 PS2 18 (SUTURE) IMPLANT
SUT MON AB 5-0 PS2 18 (SUTURE) IMPLANT
SUT PROLENE 3 0 PS 2 (SUTURE) ×3 IMPLANT
SUT VIC AB 2-0 SH 27 (SUTURE) ×3
SUT VIC AB 2-0 SH 27XBRD (SUTURE) ×1 IMPLANT
SUT VIC AB 3-0 SH 27 (SUTURE) ×3
SUT VIC AB 3-0 SH 27X BRD (SUTURE) ×1 IMPLANT
SYR 10ML LL (SYRINGE) ×6 IMPLANT
SYR BULB EAR ULCER 3OZ GRN STR (SYRINGE) ×3 IMPLANT
TOWEL OR 17X26 10 PK STRL BLUE (TOWEL DISPOSABLE) ×3 IMPLANT
UNDERPAD 30X36 HEAVY ABSORB (UNDERPADS AND DIAPERS) ×3 IMPLANT

## 2020-06-15 NOTE — H&P (Signed)
H&P: Podiatry   QHU:[id number] L Fluty 54 y.o. female  patient with a history of right foot/ankle pain seen on several occassions in office complaining of pain at scar and heel pain; Patient has tried multiple conservative treatments and opted for Surgical management. Patient had pre-op physical and clearance for revision of scar at achilles and EPF right foot completed. Patient met this AM in pre-op holding area; informed consent reviewed and signed. All questions answered. Right foot/surgical site marked.  This am patient denies any other pedal complaints. Denies Nausea/fever/vomiting/chills/night sweats/overnight events. Confirms NPO since midnight.   Patient Active Problem List   Diagnosis Date Noted  . Odontogenic infection of jaw 06/10/2020  . OSA (obstructive sleep apnea) 04/01/2020  . Moderate persistent asthma with acute exacerbation 12/30/2019  . Chronic coughing 12/30/2019  . Seasonal allergies 12/30/2019  . Loud snoring 12/16/2019  . Prediabetes 01/04/2019  . Obesity (BMI 30-39.9) 01/04/2019  . Ganglion cyst 09/28/2018  . Carpal tunnel syndrome of right wrist 09/28/2018  . Chronic midline low back pain without sciatica 11/23/2017  . Essential hypertension 11/23/2017  . Chronic pain of right knee 11/23/2017  . 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 Marijuana abuse 11/23/2017  . Tobacco dependence 11/23/2017  . Alcohol abuse 07/30/2016  . S/P ACL reconstruction 01/31/2014  . DEPRESSION 11/17/2008  . ALLERGIC RHINITIS 11/17/2008  . GERD 11/17/2008    No current facility-administered medications on file prior to encounter.   Current Outpatient Medications on File Prior to Encounter  Medication Sig Dispense Refill  . acetaminophen (TYLENOL) 500 MG tablet Take 1 tablet (500 mg total) by mouth every 6 (six) hours as needed for up to 30 doses. 30 tablet 0  . albuterol (PROVENTIL) (2.5 MG/3ML) 0.083% nebulizer solution Take 3 mLs (2.5 mg total) by nebulization every 6  (six) hours as needed for wheezing or shortness of breath. 150 mL 1  . albuterol (VENTOLIN HFA) 108 (90 Base) MCG/ACT inhaler Inhale 2 puffs into the lungs every 6 (six) hours as needed for wheezing or shortness of breath. (Patient taking differently: Inhale 2 puffs into the lungs every 6 (six) hours as needed for wheezing or shortness of breath. ) 18 g 2  . amLODipine (NORVASC) 10 MG tablet Take 1 tablet (10 mg total) by mouth daily. (Patient taking differently: Take 10 mg by mouth daily. ) 30 tablet 6  . Ascorbic Acid (VITAMIN C PO) Take 1 tablet by mouth daily.    Marland Kitchen atorvastatin (LIPITOR) 10 MG tablet Take 1 tablet (10 mg total) by mouth daily. 90 tablet 3  . DULoxetine (CYMBALTA) 30 MG capsule Take 1 capsule (30 mg total) by mouth daily. (Patient taking differently: Take 30 mg by mouth daily. ) 30 capsule 3  . fluticasone (FLONASE) 50 MCG/ACT nasal spray Place 2 sprays into both nostrils daily. (Patient taking differently: Place 2 sprays into both nostrils daily. ) 16 g 2  . Multiple Vitamin (MULTIVITAMIN) tablet Take 1 tablet by mouth daily.    . pantoprazole (PROTONIX) 40 MG tablet Take 1 tablet (40 mg total) by mouth daily. (Patient taking differently: Take 40 mg by mouth daily. ) 30 tablet 3  . SUMAtriptan (IMITREX) 100 MG tablet Take 1 tablet earliest onset of migraine.  May repeat in 2 hours if headache persists or recurs.  Maximum 2 tablets in 24 hours. (Patient taking differently: Take 100 mg by mouth every 2 (two) hours as needed. Take 1 tablet earliest onset of migraine.  May repeat in 2 hours if  headache persists or recurs.  Maximum 2 tablets in 24 hours.) 10 tablet 3  . Spacer/Aero-Holding Chambers (AEROCHAMBER MV) inhaler Use as instructed 1 each 0     Allergies  Allergen Reactions  . Diphenhydramine Hcl Itching  . Metoprolol Palpitations    Social History   Socioeconomic History  . Marital status: Legally Separated    Spouse name: Not on file  . Number of children: Not on  file  . Years of education: Not on file  . Highest education level: Not on file  Occupational History  . Not on file  Tobacco Use  . Smoking status: Current Every Day Smoker    Packs/day: 0.25    Years: 32.00    Pack years: 8.00    Types: Cigarettes  . Smokeless tobacco: Never Used  . Tobacco comment: per pt down to 6 cig per day  Vaping Use  . Vaping Use: Never used  Substance and Sexual Activity  . Alcohol use: Yes    Comment: alcohol use disorder;  06-09-2020  per pt last drank alcohol 06-06-2020  . Drug use: Not Currently    Types: Marijuana    Comment: occasional  marijuana--  hx  "crack" use  last used 2004--  no treatment rehab (done on her own)  . Sexual activity: Not on file  Other Topics Concern  . Not on file  Social History Narrative  . Not on file   Social Determinants of Health   Financial Resource Strain:   . Difficulty of Paying Living Expenses: Not on file  Food Insecurity:   . Worried About Charity fundraiser in the Last Year: Not on file  . Ran Out of Food in the Last Year: Not on file  Transportation Needs:   . Lack of Transportation (Medical): Not on file  . Lack of Transportation (Non-Medical): Not on file  Physical Activity:   . Days of Exercise per Week: Not on file  . Minutes of Exercise per Session: Not on file  Stress:   . Feeling of Stress : Not on file  Social Connections:   . Frequency of Communication with Friends and Family: Not on file  . Frequency of Social Gatherings with Friends and Family: Not on file  . Attends Religious Services: Not on file  . Active Member of Clubs or Organizations: Not on file  . Attends Archivist Meetings: Not on file  . Marital Status: Not on file  Intimate Partner Violence:   . Fear of Current or Ex-Partner: Not on file  . Emotionally Abused: Not on file  . Physically Abused: Not on file  . Sexually Abused: Not on file    Past Surgical History:  Procedure Laterality Date  . BUNIONECTOMY  Left 2011  . HEEL SPUR SURGERY Right 2012  . KNEE ARTHROSCOPY WITH ANTERIOR CRUCIATE LIGAMENT (ACL) REPAIR WITH HAMSTRING GRAFT Right 01/31/2014   Procedure: RIGHT KNEE ARTHROSCOPY WITH ALLOGRAFT (ACL) ANTERIOR CRUCIATE LIGAMENT RECONSTRUCTON PARTIAL MENISCECTOMY VERSES REPAIR ;  Surgeon: Sydnee Cabal, MD;  Location: [facility];  Service: Orthopedics;  Laterality: Right;  . TOTAL ABDOMINAL HYSTERECTOMY W/ BILATERAL SALPINGOOPHORECTOMY  04-20-2006    Family History  Problem Relation Age of Onset  . Hypertension Father   . Breast cancer Maternal Aunt      REVIEW OF SYSTEMS: Neurologic:Admits she a little anxious. Denies vertigo, syncope, convulsions or  headaches. Musculoskeletal: No muscle or joint pain. Cardiorespiratory:  Denies shortness of breath, dyspnea on exertion, chest pain,  cough or  hemoptysis. Gastrointestinal: Denies loose stool, Denies emesis, melena, constipationor rectal  bleeding. Genitourinary: No difficulty with voiding noted.  PHYSICAL EXAMINATION:  Today's Vitals   06/09/20 0949 06/15/20 0952  BP:  114/79  Pulse:  83  Resp:  16  Temp:  (!) 97.5 F (36.4 C)  TempSrc:  Oral  SpO2:  97%  Weight: 84.4 kg 84.6 kg  Height: 5' 4"  (1.626 m) 5' 4"  (1.626 m)  PainSc:  0-No pain    GENERAL: Well-developed, well-nourished, in no acute distress. Alert  and cooperative.  LOWER EXTREMITY EXAM: DERMATOLOGY: Skin warm and supple bilateral, no open lesions, nails within normal limits.Old scar with pain centrally with no signs of infection on right heel.  VASCULAR: Dorsalis Pedis 2/4 and Posterior Tibial 2/4 pedal pulses bilateral, Temperature gradient within normal limits, Capillary fill time 3 seconds, positive pedal hair growth present bilateral. NEUROLOGY: Gross sensation present with light touch bilateral constant burning pain bilateral  MUSCULOSKELETAL: +foot deformity/ pain with palpation to area of concernat right plantar and  posterior heel    XRAY, RIGHT FOOT-Chart    ASSESSMENTS:  1.Painful scar right achilles 2. Chronic plantar fasciitis, right foot 3. Right heel pain  PLAN OF CARE: Patient seen and evaluated 1. History and physical completed 2. Patient NPO since midnight 3. Previous Imaging reviewed  4. Consent for surgery explained and obtained for revision/excision of scar and EPF right foot  risk and benefits explained; all questions answered and no guarantees granted. 5. Patient to undergo above surgical procedure 6. Case discussed with patient and to meet with Aunt and boyfriend represented after the procedure 7. To resume all home meds post-procedure and to give prn pain meds, anti-nausea, and anti-constipation medications post op 8. Will continue to follow closely/ see post-operative in the office within 1 week.  Landis Martins, DPM

## 2020-06-15 NOTE — Anesthesia Procedure Notes (Addendum)
Procedure Name: Intubation Date/Time: 06/15/2020 10:51 AM Performed by: Myna Bright, CRNA Pre-anesthesia Checklist: Patient identified, Emergency Drugs available, Suction available and Patient being monitored Patient Re-evaluated:Patient Re-evaluated prior to induction Oxygen Delivery Method: Circle system utilized Preoxygenation: Pre-oxygenation with 100% oxygen Induction Type: IV induction Ventilation: Mask ventilation without difficulty Laryngoscope Size: Mac and 3 Grade View: Grade II Tube type: Oral Tube size: 7.0 mm Number of attempts: 1 Airway Equipment and Method: Stylet Placement Confirmation: ETT inserted through vocal cords under direct vision,  positive ETCO2 and breath sounds checked- equal and bilateral Secured at: 21 cm Tube secured with: Tape Dental Injury: Teeth and Oropharynx as per pre-operative assessment

## 2020-06-15 NOTE — Anesthesia Postprocedure Evaluation (Signed)
Anesthesia Post Note  Patient: [name] [name]  Procedure(s) Performed: ENDOSCOPIC PLANTAR FASCIOTOMY WITH EXCISION OF PAINFUL SCAR (Right Foot)     Patient location during evaluation: PACU Anesthesia Type: General Level of consciousness: awake and alert Pain management: pain level controlled Vital Signs Assessment: post-procedure vital signs reviewed and stable Respiratory status: spontaneous breathing, nonlabored ventilation, respiratory function stable and patient connected to nasal cannula oxygen Cardiovascular status: blood pressure returned to baseline and stable Postop Assessment: no apparent nausea or vomiting Anesthetic complications: no   No complications documented.  Last Vitals:  Vitals:   06/15/20 1245 06/15/20 1300  BP: 110/64 113/69  Pulse: 80 91  Resp: 19 (!) 26  Temp:    SpO2: 95% 92%    Last Pain:  Vitals:   06/15/20 1300  TempSrc:   PainSc: 0-No pain                 Trevor Iha

## 2020-06-15 NOTE — Op Note (Signed)
DATE: 06/15/20  SURGEON: Asencion Islam, DPM  PREOPERATIVE DIAGNOSIS: Right painful scar at achilles Right plantar fasciitis Right heel pain     POSTOPERATIVE DIAGNOSIS:Same     PROCEDURE PERFORMED:right excision of painful scar and endoscopic release of plantar fascia on right      HEMOSTASIS: right ankle tourniquet     ESTIMATED BLOOD LOSS: Minimal     ANESTHESIA: General with local 20cc1% lidocaine plain     SPECIMENS:None   COMPLICATIONS: None.     INDICATIONS FOR PROCEDURE: This patient is a pleasant 54 y.o. female who has been seen in office for ongoing pain to right heel not relieved with conservative care.     PREPARATION FOR PROCEDURE: The patient was brought to the operating room and placed on the operating table in prone position after padding. A pneumatic ankle tourniquet was placed about the patient's right foot but not yet inflated. After the department of anesthesia had administered General anesthesia. A local block was administered andthe right foot was then scrubbed, prepped, and draped in the usual aseptic manner. An Esmarch bandage was utilized to exsanguinate the patient's right foot and leg, and the pneumatic tourniquet was inflated to 250 mmHg.  PROCEDURE IN DETAIL: Attention was then directed to posterior heel were there was a thickened scar noted. Using a 15 blade and 3:1 elliptical incision was made through scar tissue excising out scar through skin and subcutaneous levels. Once completely excised then using a bovie electrocautery was performed. The area was then flushed using saline and layer closure was performed using 3-0 vicryl, 3-0 prolene and staples.  Attention was then directed to the right plantar heel at the medial calcaneus where a small stab incision was made utilizing a 15 blade at the plantar fascial insertion the incision was deepened using a hemostat and blunt dissection using a tissue elevator following a  trocar and cannula were inserted across the plantar aspect of the heel and the trocar was exited at the lateral aspect of the heel where another small stab incision was made to allow for appropriate placement of the endoscopic plantar fascial set.  Following utilizing endoscopic camera the plantar fascia was then visualized revealing thickened portions at the medial aspect following using a hook plate the plantar fascial tendon was then transected two thirds of the way releasing the contracted and tightened and thickened plantar fascia down to level of exposed muscle belly.  Then the area was then flushed and the trocar and cannula was removed and the heel incisions were reapproximated utilizing 5-0 nylon.  The right foot was then dressed with adaptic overlying the suture sites, 4 x 4 gauze, Kerlix, Coban and ACE. At this time, the right pneumatic tourniquet was deflated, and a positive hyperemic response was noted to the lesser digits of the right foot. The patient tolerated the procedure and anesthesia well. Upon transfer to the recovery room, the patient's vital signs were stable, and neurovascular status was intact.   Postoperative prescriptions and instructions were written and given to the patient who will return to the office of Dr. Marylene Land in 1 week for continued care and management of this patient. Patient to be admitted 23 Observation then discharged home.   Asencion Islam, DPM

## 2020-06-15 NOTE — Transfer of Care (Signed)
Immediate Anesthesia Transfer of Care Note  Patient: [name] [name]  Procedure(s) Performed: ENDOSCOPIC PLANTAR FASCIOTOMY WITH EXCISION OF PAINFUL SCAR (Right Foot)  Patient Location: PACU  Anesthesia Type:General  Level of Consciousness: awake, alert , oriented and patient cooperative  Airway & Oxygen Therapy: Patient Spontanous Breathing and Patient connected to face mask oxygen  Post-op Assessment: Report given to RN, Post -op Vital signs reviewed and stable and Patient moving all extremities  Post vital signs: Reviewed and stable  Last Vitals:  Vitals Value Taken Time  BP 113/69 06/15/20 1300  Temp 36.5 C 06/15/20 1203  Pulse 80 06/15/20 1309  Resp 19 06/15/20 1309  SpO2 99 % 06/15/20 1309  Vitals shown include unvalidated device data.  Last Pain:  Vitals:   06/15/20 1300  TempSrc:   PainSc: 0-No pain      Patients Stated Pain Goal: 7 (06/15/20 3244)  Complications: No complications documented.

## 2020-06-15 NOTE — Discharge Instructions (Signed)

## 2020-06-15 NOTE — Progress Notes (Signed)
Orthopedic Tech Progress Note Patient Details:  LAUNA GOEDKEN 04-29-1966 [id number]  Ortho Devices Type of Ortho Device: CAM walker Ortho Device/Splint Location: right , drop off       Saul Fordyce 06/15/2020, 11:57 AM

## 2020-06-15 NOTE — Brief Op Note (Signed)
06/15/2020  12:11 PM  PATIENT:  [name] [name]  54 y.o. female  PRE-OPERATIVE DIAGNOSIS:  PLANTAR FASCIITIS PAINFUL SCARRING RIGHT FOOT  POST-OPERATIVE DIAGNOSIS:  PLANTAR FASCIITIS PAINFUL SCARRING RIGHT FOOT  PROCEDURE:  Procedure(s) with comments: ENDOSCOPIC PLANTAR FASCIOTOMY WITH EXCISION OF PAINFUL SCAR (Right) - LOCAL  SURGEON:  Surgeon(s) and Role:    * Jaedan Schuman, Probation officer, DPM - Primary  PHYSICIAN ASSISTANT:   ASSISTANTS: None  ANESTHESIA:   general  EBL:  5 mL   BLOOD ADMINISTERED:none  DRAINS: none   LOCAL MEDICATIONS USED:  LIDOCAINE   SPECIMEN:  No Specimen  DISPOSITION OF SPECIMEN:  N/A  COUNTS:  YES  TOURNIQUET:   Total Tourniquet Time Documented: Calf (Right) - 35 minutes Total: Calf (Right) - 35 minutes   DICTATION: .Note written in EPIC  PLAN OF CARE: Discharge to home after PACU  PATIENT DISPOSITION:  PACU - hemodynamically stable.   Delay start of Pharmacological VTE agent (>24hrs) due to surgical blood loss or risk of bleeding: no

## 2020-06-16 ENCOUNTER — Telehealth: Payer: Self-pay | Admitting: Sports Medicine

## 2020-06-16 ENCOUNTER — Encounter (HOSPITAL_BASED_OUTPATIENT_CLINIC_OR_DEPARTMENT_OTHER): Payer: Self-pay | Admitting: Sports Medicine

## 2020-06-16 ENCOUNTER — Other Ambulatory Visit: Payer: Self-pay | Admitting: Sports Medicine

## 2020-06-16 MED ORDER — ZOLPIDEM TARTRATE 5 MG PO TABS: 5.0000 mg | ORAL_TABLET | Freq: Every evening | ORAL | 1 refills | Status: DC | PRN

## 2020-06-16 NOTE — Telephone Encounter (Signed)
Pt called stating she needs something to help her sleep at night. I spoke with Dr. Marylene Land and she has sent an order to the pt's pharmacy.

## 2020-06-16 NOTE — Progress Notes (Signed)
Ambien sent to patient pharmacy for sleep do not mix with pain medication.

## 2020-06-18 NOTE — Op Note (Signed)
Add addendum to previous operative report scar at posterior right heel measuring 3 x 1 cm

## 2020-06-25 ENCOUNTER — Encounter: Payer: Self-pay | Admitting: Sports Medicine

## 2020-06-25 ENCOUNTER — Ambulatory Visit (INDEPENDENT_AMBULATORY_CARE_PROVIDER_SITE_OTHER): Payer: No Typology Code available for payment source | Admitting: Sports Medicine

## 2020-06-25 ENCOUNTER — Other Ambulatory Visit: Payer: Self-pay

## 2020-06-25 ENCOUNTER — Encounter: Payer: No Typology Code available for payment source | Admitting: Sports Medicine

## 2020-06-25 DIAGNOSIS — M7731 Calcaneal spur, right foot: Secondary | ICD-10-CM

## 2020-06-25 DIAGNOSIS — L905 Scar conditions and fibrosis of skin: Secondary | ICD-10-CM

## 2020-06-25 DIAGNOSIS — M722 Plantar fascial fibromatosis: Secondary | ICD-10-CM

## 2020-06-25 DIAGNOSIS — G629 Polyneuropathy, unspecified: Secondary | ICD-10-CM

## 2020-06-25 DIAGNOSIS — Z9889 Other specified postprocedural states: Secondary | ICD-10-CM

## 2020-06-25 DIAGNOSIS — M7661 Achilles tendinitis, right leg: Secondary | ICD-10-CM

## 2020-06-25 DIAGNOSIS — M79671 Pain in right foot: Secondary | ICD-10-CM

## 2020-06-25 DIAGNOSIS — R52 Pain, unspecified: Secondary | ICD-10-CM

## 2020-06-25 MED ORDER — HYDROCODONE-ACETAMINOPHEN 10-325 MG PO TABS: 1.0000 | ORAL_TABLET | Freq: Four times a day (QID) | ORAL | 0 refills | Status: AC | PRN

## 2020-06-25 NOTE — Progress Notes (Signed)
Subjective: [name] [name] is a 54 y.o. female patient seen today in office for POV #1 (DOS 06/15/20), S/P excision of painful scar and EPF on right. Patient admits pain at surgical site off and on, denies calf pain, denies headache, chest pain, shortness of breath, nausea, vomiting, fever, or chills.No other issues noted.   Patient Active Problem List   Diagnosis Date Noted  . Odontogenic infection of jaw 06/10/2020  . OSA (obstructive sleep apnea) 04/01/2020  . Moderate persistent asthma with acute exacerbation 12/30/2019  . Chronic coughing 12/30/2019  . Seasonal allergies 12/30/2019  . Loud snoring 12/16/2019  . Prediabetes 01/04/2019  . Obesity (BMI 30-39.9) 01/04/2019  . Ganglion cyst 09/28/2018  . Carpal tunnel syndrome of right wrist 09/28/2018  . Chronic midline low back pain without sciatica 11/23/2017  . Essential hypertension 11/23/2017  . Chronic pain of right knee 11/23/2017  . 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 Marijuana abuse 11/23/2017  . Tobacco dependence 11/23/2017  . Alcohol abuse 07/30/2016  . S/P ACL reconstruction 01/31/2014  . DEPRESSION 11/17/2008  . ALLERGIC RHINITIS 11/17/2008  . GERD 11/17/2008    Current Outpatient Medications on File Prior to Visit  Medication Sig Dispense Refill  . acetaminophen (TYLENOL) 500 MG tablet Take 1 tablet (500 mg total) by mouth every 6 (six) hours as needed for up to 30 doses. 30 tablet 0  . albuterol (PROVENTIL) (2.5 MG/3ML) 0.083% nebulizer solution Take 3 mLs (2.5 mg total) by nebulization every 6 (six) hours as needed for wheezing or shortness of breath. 150 mL 1  . albuterol (VENTOLIN HFA) 108 (90 Base) MCG/ACT inhaler Inhale 2 puffs into the lungs every 6 (six) hours as needed for wheezing or shortness of breath. (Patient taking differently: Inhale 2 puffs into the lungs every 6 (six) hours as needed for wheezing or shortness of breath. ) 18 g 2  . amLODipine (NORVASC) 10 MG tablet Take 1 tablet (10 mg  total) by mouth daily. (Patient taking differently: Take 10 mg by mouth daily. ) 30 tablet 6  . Ascorbic Acid (VITAMIN C PO) Take 1 tablet by mouth daily.    Marland Kitchen atorvastatin (LIPITOR) 10 MG tablet Take 1 tablet (10 mg total) by mouth daily. 90 tablet 3  . docusate sodium (COLACE) 100 MG capsule Take 1 capsule (100 mg total) by mouth daily as needed. 30 capsule 2  . DULoxetine (CYMBALTA) 30 MG capsule Take 1 capsule (30 mg total) by mouth daily. (Patient taking differently: Take 30 mg by mouth daily. ) 30 capsule 3  . fluticasone (FLONASE) 50 MCG/ACT nasal spray Place 2 sprays into both nostrils daily. (Patient taking differently: Place 2 sprays into both nostrils daily. ) 16 g 2  . ibuprofen (ADVIL) 800 MG tablet Take 1 tablet (800 mg total) by mouth every 6 (six) hours as needed. Take in between doses of pain medication for additional relief or for mild pain 30 tablet 0  . lisinopril-hydrochlorothiazide (ZESTORETIC) 20-25 MG tablet Take 1 tablet by mouth daily. 90 tablet 3  . Multiple Vitamin (MULTIVITAMIN) tablet Take 1 tablet by mouth daily.    . pantoprazole (PROTONIX) 40 MG tablet Take 1 tablet (40 mg total) by mouth daily. (Patient taking differently: Take 40 mg by mouth daily. ) 30 tablet 3  . promethazine (PHENERGAN) 12.5 MG tablet Take 1 tablet (12.5 mg total) by mouth every 6 (six) hours as needed for nausea or vomiting. 30 tablet 0  . Spacer/Aero-Holding Chambers (AEROCHAMBER MV) inhaler Use as instructed  1 each 0  . SUMAtriptan (IMITREX) 100 MG tablet Take 1 tablet earliest onset of migraine.  May repeat in 2 hours if headache persists or recurs.  Maximum 2 tablets in 24 hours. (Patient taking differently: Take 100 mg by mouth every 2 (two) hours as needed. Take 1 tablet earliest onset of migraine.  May repeat in 2 hours if headache persists or recurs.  Maximum 2 tablets in 24 hours.) 10 tablet 3  . zolpidem (AMBIEN) 5 MG tablet Take 1 tablet (5 mg total) by mouth at bedtime as needed for  sleep. 15 tablet 1   No current facility-administered medications on file prior to visit.    Allergies  Allergen Reactions  . Diphenhydramine Hcl Itching  . Metoprolol Palpitations    Objective: There were no vitals filed for this visit.  General: No acute distress, AAOx3  Right foot: Sutures intact with no gapping or dehiscence at surgical site, mild swelling to right heel, no erythema, no warmth, no drainage, no signs of infection noted, Capillary fill time <3 seconds in all digits, gross sensation present via light touch to right foot. No pain or crepitation with range of motion right foot.  No pain with calf compression.   Assessment and Plan:  Problem List Items Addressed This Visit    None    Visit Diagnoses    S/P foot surgery, right    -  Primary   Scar painful       Achilles tendinitis, right leg       Plantar fasciitis       Heel spur, right       Right foot pain       Neuropathy           -Patient seen and evaluated -Applied dry sterile dressing to surgical site right/ foot secured with ACE wrap and stockinet  -Advised patient to make sure to keep dressings clean, dry, and intact to right surgical site, removing the ACE as needed  -Advised patient to continue with nonweightbearing to right foot with CAM boot and walker -Advised patient to limit activity to necessity  -Advised patient to ice and elevate as necessary  -Will plan for possible suture removal at next office visit. In the meantime, patient to call office if any issues or problems arise.   Asencion Islam, DPM

## 2020-07-01 ENCOUNTER — Encounter: Payer: No Typology Code available for payment source | Admitting: Podiatry

## 2020-07-09 ENCOUNTER — Other Ambulatory Visit: Payer: Self-pay

## 2020-07-09 ENCOUNTER — Ambulatory Visit (INDEPENDENT_AMBULATORY_CARE_PROVIDER_SITE_OTHER): Payer: No Typology Code available for payment source | Admitting: Sports Medicine

## 2020-07-09 ENCOUNTER — Encounter: Payer: Self-pay | Admitting: Sports Medicine

## 2020-07-09 DIAGNOSIS — Z9889 Other specified postprocedural states: Secondary | ICD-10-CM

## 2020-07-09 DIAGNOSIS — M7731 Calcaneal spur, right foot: Secondary | ICD-10-CM

## 2020-07-09 DIAGNOSIS — L905 Scar conditions and fibrosis of skin: Secondary | ICD-10-CM

## 2020-07-09 DIAGNOSIS — M7661 Achilles tendinitis, right leg: Secondary | ICD-10-CM

## 2020-07-09 DIAGNOSIS — M722 Plantar fascial fibromatosis: Secondary | ICD-10-CM

## 2020-07-09 DIAGNOSIS — M79671 Pain in right foot: Secondary | ICD-10-CM

## 2020-07-09 DIAGNOSIS — R52 Pain, unspecified: Secondary | ICD-10-CM

## 2020-07-09 MED ORDER — ZOLPIDEM TARTRATE 5 MG PO TABS: 5.0000 mg | ORAL_TABLET | Freq: Every evening | ORAL | 1 refills | Status: DC | PRN

## 2020-07-09 MED ORDER — HYDROCODONE-ACETAMINOPHEN 10-325 MG PO TABS: 1.0000 | ORAL_TABLET | Freq: Four times a day (QID) | ORAL | 0 refills | Status: AC | PRN

## 2020-07-09 NOTE — Progress Notes (Signed)
Subjective: [name] [name] is a 54 y.o. female patient seen today in office for POV #2 (DOS 06/15/20), S/P excision of painful scar and EPF on right. Patient admits pain at surgical site off and on and worse at foot, denies calf pain, denies headache, chest pain, shortness of breath, nausea, vomiting, fever, or chills.No other issues noted.   Patient Active Problem List   Diagnosis Date Noted  . Odontogenic infection of jaw 06/10/2020  . OSA (obstructive sleep apnea) 04/01/2020  . Moderate persistent asthma with acute exacerbation 12/30/2019  . Chronic coughing 12/30/2019  . Seasonal allergies 12/30/2019  . Loud snoring 12/16/2019  . Prediabetes 01/04/2019  . Obesity (BMI 30-39.9) 01/04/2019  . Ganglion cyst 09/28/2018  . Carpal tunnel syndrome of right wrist 09/28/2018  . Chronic midline low back pain without sciatica 11/23/2017  . Essential hypertension 11/23/2017  . Chronic pain of right knee 11/23/2017  . 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 Marijuana abuse 11/23/2017  . Tobacco dependence 11/23/2017  . Alcohol abuse 07/30/2016  . S/P ACL reconstruction 01/31/2014  . DEPRESSION 11/17/2008  . ALLERGIC RHINITIS 11/17/2008  . GERD 11/17/2008    Current Outpatient Medications on File Prior to Visit  Medication Sig Dispense Refill  . acetaminophen (TYLENOL) 500 MG tablet Take 1 tablet (500 mg total) by mouth every 6 (six) hours as needed for up to 30 doses. 30 tablet 0  . albuterol (PROVENTIL) (2.5 MG/3ML) 0.083% nebulizer solution Take 3 mLs (2.5 mg total) by nebulization every 6 (six) hours as needed for wheezing or shortness of breath. 150 mL 1  . albuterol (VENTOLIN HFA) 108 (90 Base) MCG/ACT inhaler Inhale 2 puffs into the lungs every 6 (six) hours as needed for wheezing or shortness of breath. (Patient taking differently: Inhale 2 puffs into the lungs every 6 (six) hours as needed for wheezing or shortness of breath. ) 18 g 2  . amLODipine (NORVASC) 10 MG tablet Take 1  tablet (10 mg total) by mouth daily. (Patient taking differently: Take 10 mg by mouth daily. ) 30 tablet 6  . Ascorbic Acid (VITAMIN C PO) Take 1 tablet by mouth daily.    Marland Kitchen atorvastatin (LIPITOR) 10 MG tablet Take 1 tablet (10 mg total) by mouth daily. 90 tablet 3  . docusate sodium (COLACE) 100 MG capsule Take 1 capsule (100 mg total) by mouth daily as needed. 30 capsule 2  . DULoxetine (CYMBALTA) 30 MG capsule Take 1 capsule (30 mg total) by mouth daily. (Patient taking differently: Take 30 mg by mouth daily. ) 30 capsule 3  . fluticasone (FLONASE) 50 MCG/ACT nasal spray Place 2 sprays into both nostrils daily. (Patient taking differently: Place 2 sprays into both nostrils daily. ) 16 g 2  . ibuprofen (ADVIL) 800 MG tablet Take 1 tablet (800 mg total) by mouth every 6 (six) hours as needed. Take in between doses of pain medication for additional relief or for mild pain 30 tablet 0  . lisinopril-hydrochlorothiazide (ZESTORETIC) 20-25 MG tablet Take 1 tablet by mouth daily. 90 tablet 3  . Multiple Vitamin (MULTIVITAMIN) tablet Take 1 tablet by mouth daily.    . pantoprazole (PROTONIX) 40 MG tablet Take 1 tablet (40 mg total) by mouth daily. (Patient taking differently: Take 40 mg by mouth daily. ) 30 tablet 3  . predniSONE (STERAPRED UNI-PAK 21 TAB) 10 MG (21) TBPK tablet Take by mouth.    . promethazine (PHENERGAN) 12.5 MG tablet Take 1 tablet (12.5 mg total) by mouth every 6 (  six) hours as needed for nausea or vomiting. 30 tablet 0  . Spacer/Aero-Holding Chambers (AEROCHAMBER MV) inhaler Use as instructed 1 each 0  . SUMAtriptan (IMITREX) 100 MG tablet Take 1 tablet earliest onset of migraine.  May repeat in 2 hours if headache persists or recurs.  Maximum 2 tablets in 24 hours. (Patient taking differently: Take 100 mg by mouth every 2 (two) hours as needed. Take 1 tablet earliest onset of migraine.  May repeat in 2 hours if headache persists or recurs.  Maximum 2 tablets in 24 hours.) 10 tablet 3    No current facility-administered medications on file prior to visit.    Allergies  Allergen Reactions  . Diphenhydramine Hcl Itching  . Metoprolol Palpitations    Objective: There were no vitals filed for this visit.  General: No acute distress, AAOx3  Right foot: Sutures and staples intact with no gapping or dehiscence at surgical site, mild swelling to right heel, no erythema, no warmth, no drainage, no signs of infection noted, Capillary fill time <3 seconds in all digits, gross sensation present via light touch to right foot. No pain or crepitation with range of motion right foot.  No pain with calf compression.   Assessment and Plan:  Problem List Items Addressed This Visit    None    Visit Diagnoses    S/P foot surgery, right    -  Primary   Scar painful       Achilles tendinitis, right leg       Plantar fasciitis       Heel spur, right       Right foot pain           -Patient seen and evaluated -A few sutures were removed -Applied dry sterile dressing to surgical site right foot secured with ACE wrap and stockinet  -Advised patient to make sure to keep dressings clean, dry, and intact to right surgical site, removing the ACE as needed  -Advised patient may now weight-bear with surgical shoe and cane -Advised patient to limit activity to necessity  -Advised patient to ice and elevate as necessary  -Refilled pain medicine and Ambien -Will plan for finishing staple and suture removal at next office visit. In the meantime, patient to call office if any issues or problems arise.   Asencion Islam, DPM

## 2020-07-16 ENCOUNTER — Ambulatory Visit (INDEPENDENT_AMBULATORY_CARE_PROVIDER_SITE_OTHER): Payer: No Typology Code available for payment source | Admitting: Sports Medicine

## 2020-07-16 ENCOUNTER — Other Ambulatory Visit: Payer: Self-pay

## 2020-07-16 ENCOUNTER — Encounter: Payer: Self-pay | Admitting: Sports Medicine

## 2020-07-16 DIAGNOSIS — M722 Plantar fascial fibromatosis: Secondary | ICD-10-CM

## 2020-07-16 DIAGNOSIS — M7731 Calcaneal spur, right foot: Secondary | ICD-10-CM

## 2020-07-16 DIAGNOSIS — M79671 Pain in right foot: Secondary | ICD-10-CM

## 2020-07-16 DIAGNOSIS — Z9889 Other specified postprocedural states: Secondary | ICD-10-CM

## 2020-07-16 DIAGNOSIS — G629 Polyneuropathy, unspecified: Secondary | ICD-10-CM

## 2020-07-16 DIAGNOSIS — R52 Pain, unspecified: Secondary | ICD-10-CM

## 2020-07-16 DIAGNOSIS — M7661 Achilles tendinitis, right leg: Secondary | ICD-10-CM

## 2020-07-16 DIAGNOSIS — L905 Scar conditions and fibrosis of skin: Secondary | ICD-10-CM

## 2020-07-16 MED ORDER — ZOLPIDEM TARTRATE 5 MG PO TABS: 5.0000 mg | ORAL_TABLET | Freq: Every evening | ORAL | 1 refills | Status: DC | PRN

## 2020-07-16 NOTE — Progress Notes (Signed)
Subjective: [name] [name] is a 54 y.o. female patient seen today in office for POV #3 (DOS 06/15/20), S/P excision of painful scar and EPF on right. Patient admits that she is nervous, reports she is terrified about suture and staple removal, denies calf pain, denies headache, chest pain, shortness of breath, nausea, vomiting, fever, or chills.No other issues noted.   Patient Active Problem List   Diagnosis Date Noted  . Odontogenic infection of jaw 06/10/2020  . OSA (obstructive sleep apnea) 04/01/2020  . Moderate persistent asthma with acute exacerbation 12/30/2019  . Chronic coughing 12/30/2019  . Seasonal allergies 12/30/2019  . Loud snoring 12/16/2019  . Prediabetes 01/04/2019  . Obesity (BMI 30-39.9) 01/04/2019  . Ganglion cyst 09/28/2018  . Carpal tunnel syndrome of right wrist 09/28/2018  . Chronic midline low back pain without sciatica 11/23/2017  . Essential hypertension 11/23/2017  . Chronic pain of right knee 11/23/2017  . 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 Marijuana abuse 11/23/2017  . Tobacco dependence 11/23/2017  . Alcohol abuse 07/30/2016  . S/P ACL reconstruction 01/31/2014  . DEPRESSION 11/17/2008  . ALLERGIC RHINITIS 11/17/2008  . GERD 11/17/2008    Current Outpatient Medications on File Prior to Visit  Medication Sig Dispense Refill  . acetaminophen (TYLENOL) 500 MG tablet Take 1 tablet (500 mg total) by mouth every 6 (six) hours as needed for up to 30 doses. 30 tablet 0  . albuterol (PROVENTIL) (2.5 MG/3ML) 0.083% nebulizer solution Take 3 mLs (2.5 mg total) by nebulization every 6 (six) hours as needed for wheezing or shortness of breath. 150 mL 1  . albuterol (VENTOLIN HFA) 108 (90 Base) MCG/ACT inhaler Inhale 2 puffs into the lungs every 6 (six) hours as needed for wheezing or shortness of breath. (Patient taking differently: Inhale 2 puffs into the lungs every 6 (six) hours as needed for wheezing or shortness of breath. ) 18 g 2  . amLODipine  (NORVASC) 10 MG tablet Take 1 tablet (10 mg total) by mouth daily. (Patient taking differently: Take 10 mg by mouth daily. ) 30 tablet 6  . Ascorbic Acid (VITAMIN C PO) Take 1 tablet by mouth daily.    Marland Kitchen atorvastatin (LIPITOR) 10 MG tablet Take 1 tablet (10 mg total) by mouth daily. 90 tablet 3  . docusate sodium (COLACE) 100 MG capsule Take 1 capsule (100 mg total) by mouth daily as needed. 30 capsule 2  . DULoxetine (CYMBALTA) 30 MG capsule Take 1 capsule (30 mg total) by mouth daily. (Patient taking differently: Take 30 mg by mouth daily. ) 30 capsule 3  . fluticasone (FLONASE) 50 MCG/ACT nasal spray Place 2 sprays into both nostrils daily. (Patient taking differently: Place 2 sprays into both nostrils daily. ) 16 g 2  . HYDROcodone-acetaminophen (NORCO) 10-325 MG tablet Take 1 tablet by mouth every 6 (six) hours as needed for up to 7 days. 28 tablet 0  . ibuprofen (ADVIL) 800 MG tablet Take 1 tablet (800 mg total) by mouth every 6 (six) hours as needed. Take in between doses of pain medication for additional relief or for mild pain 30 tablet 0  . lisinopril-hydrochlorothiazide (ZESTORETIC) 20-25 MG tablet Take 1 tablet by mouth daily. 90 tablet 3  . Multiple Vitamin (MULTIVITAMIN) tablet Take 1 tablet by mouth daily.    . pantoprazole (PROTONIX) 40 MG tablet Take 1 tablet (40 mg total) by mouth daily. (Patient taking differently: Take 40 mg by mouth daily. ) 30 tablet 3  . predniSONE (STERAPRED UNI-PAK 21  TAB) 10 MG (21) TBPK tablet Take by mouth.    . promethazine (PHENERGAN) 12.5 MG tablet Take 1 tablet (12.5 mg total) by mouth every 6 (six) hours as needed for nausea or vomiting. 30 tablet 0  . Spacer/Aero-Holding Chambers (AEROCHAMBER MV) inhaler Use as instructed 1 each 0  . SUMAtriptan (IMITREX) 100 MG tablet Take 1 tablet earliest onset of migraine.  May repeat in 2 hours if headache persists or recurs.  Maximum 2 tablets in 24 hours. (Patient taking differently: Take 100 mg by mouth every 2  (two) hours as needed. Take 1 tablet earliest onset of migraine.  May repeat in 2 hours if headache persists or recurs.  Maximum 2 tablets in 24 hours.) 10 tablet 3  . zolpidem (AMBIEN) 5 MG tablet Take 1 tablet (5 mg total) by mouth at bedtime as needed for sleep. 15 tablet 1   No current facility-administered medications on file prior to visit.    Allergies  Allergen Reactions  . Diphenhydramine Hcl Itching  . Metoprolol Palpitations    Objective: There were no vitals filed for this visit.  General: No acute distress, AAOx3  Right foot: Remaining sutures and staples intact with no gapping or dehiscence at surgical site, mild swelling to right heel, no erythema, no warmth, no drainage, no signs of infection noted, Capillary fill time <3 seconds in all digits, gross sensation present via light touch to right foot. No pain or crepitation with range of motion right foot.  No pain with calf compression.   Assessment and Plan:  Problem List Items Addressed This Visit   None   Visit Diagnoses    S/P foot surgery, right    -  Primary   Scar painful       Achilles tendinitis, right leg       Plantar fasciitis       Heel spur, right       Right foot pain       Neuropathy           -Patient seen and evaluated -Remaining staples and sutures were removed -Applied dry sterile dressing to surgical site right foot secured with ACE wrap and stockinet  -May remove on tommorrow for a shower and redress with ACE wrap to keep swelling under control  -Advised patient to limit activity to necessity and may continue to weightbear to tolerance with post op shoe and cane -Advised patient to ice and elevate as necessary  -Continue with pain meds and PRN meds for sleep; Refill provided this visit. -Will plan for post op check and xrays at next office visit. In the meantime, patient to call office if any issues or problems arise.   Asencion Islam, DPM

## 2020-07-30 ENCOUNTER — Encounter: Payer: Self-pay | Admitting: Sports Medicine

## 2020-07-30 ENCOUNTER — Ambulatory Visit (INDEPENDENT_AMBULATORY_CARE_PROVIDER_SITE_OTHER): Payer: No Typology Code available for payment source | Admitting: Sports Medicine

## 2020-07-30 ENCOUNTER — Ambulatory Visit (INDEPENDENT_AMBULATORY_CARE_PROVIDER_SITE_OTHER): Payer: No Typology Code available for payment source

## 2020-07-30 ENCOUNTER — Other Ambulatory Visit: Payer: Self-pay

## 2020-07-30 DIAGNOSIS — M722 Plantar fascial fibromatosis: Secondary | ICD-10-CM

## 2020-07-30 DIAGNOSIS — M79671 Pain in right foot: Secondary | ICD-10-CM

## 2020-07-30 DIAGNOSIS — Z9889 Other specified postprocedural states: Secondary | ICD-10-CM

## 2020-07-30 DIAGNOSIS — M7731 Calcaneal spur, right foot: Secondary | ICD-10-CM

## 2020-07-30 DIAGNOSIS — M79672 Pain in left foot: Secondary | ICD-10-CM

## 2020-07-30 DIAGNOSIS — M7661 Achilles tendinitis, right leg: Secondary | ICD-10-CM

## 2020-07-30 DIAGNOSIS — R52 Pain, unspecified: Secondary | ICD-10-CM

## 2020-07-30 DIAGNOSIS — L905 Scar conditions and fibrosis of skin: Secondary | ICD-10-CM

## 2020-07-30 MED ORDER — HYDROCODONE-ACETAMINOPHEN 5-325 MG PO TABS: 1.0000 | ORAL_TABLET | Freq: Four times a day (QID) | ORAL | 0 refills | Status: AC | PRN

## 2020-07-30 MED ORDER — IBUPROFEN 800 MG PO TABS
800.0000 mg | ORAL_TABLET | Freq: Four times a day (QID) | ORAL | 0 refills | Status: DC | PRN
Start: 2020-07-30 — End: 2020-10-01

## 2020-07-30 MED ORDER — ZOLPIDEM TARTRATE 5 MG PO TABS: 5.0000 mg | ORAL_TABLET | Freq: Every evening | ORAL | 1 refills | Status: DC | PRN

## 2020-07-30 NOTE — Progress Notes (Signed)
Subjective: [name] [name] is a 54 y.o. female patient seen today in office for POV #4 (DOS 06/15/20), S/P excision of painful scar and EPF on right. Patient admits that she she still has some swelling and pain at surgery site and now her left foot is hurting, denies calf pain, denies headache, chest pain, shortness of breath, nausea, vomiting, fever, or chills.No other issues noted.   Patient Active Problem List   Diagnosis Date Noted  . Odontogenic infection of jaw 06/10/2020  . OSA (obstructive sleep apnea) 04/01/2020  . Moderate persistent asthma with acute exacerbation 12/30/2019  . Chronic coughing 12/30/2019  . Seasonal allergies 12/30/2019  . Loud snoring 12/16/2019  . Prediabetes 01/04/2019  . Obesity (BMI 30-39.9) 01/04/2019  . Ganglion cyst 09/28/2018  . Carpal tunnel syndrome of right wrist 09/28/2018  . Chronic midline low back pain without sciatica 11/23/2017  . Essential hypertension 11/23/2017  . Chronic pain of right knee 11/23/2017  . 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 Marijuana abuse 11/23/2017  . Tobacco dependence 11/23/2017  . Alcohol abuse 07/30/2016  . S/P ACL reconstruction 01/31/2014  . DEPRESSION 11/17/2008  . ALLERGIC RHINITIS 11/17/2008  . GERD 11/17/2008    Current Outpatient Medications on File Prior to Visit  Medication Sig Dispense Refill  . acetaminophen (TYLENOL) 500 MG tablet Take 1 tablet (500 mg total) by mouth every 6 (six) hours as needed for up to 30 doses. 30 tablet 0  . albuterol (PROVENTIL) (2.5 MG/3ML) 0.083% nebulizer solution Take 3 mLs (2.5 mg total) by nebulization every 6 (six) hours as needed for wheezing or shortness of breath. 150 mL 1  . albuterol (VENTOLIN HFA) 108 (90 Base) MCG/ACT inhaler Inhale 2 puffs into the lungs every 6 (six) hours as needed for wheezing or shortness of breath. (Patient taking differently: Inhale 2 puffs into the lungs every 6 (six) hours as needed for wheezing or shortness of breath. ) 18 g 2   . amLODipine (NORVASC) 10 MG tablet Take 1 tablet (10 mg total) by mouth daily. (Patient taking differently: Take 10 mg by mouth daily. ) 30 tablet 6  . Ascorbic Acid (VITAMIN C PO) Take 1 tablet by mouth daily.    Marland Kitchen atorvastatin (LIPITOR) 10 MG tablet Take 1 tablet (10 mg total) by mouth daily. 90 tablet 3  . docusate sodium (COLACE) 100 MG capsule Take 1 capsule (100 mg total) by mouth daily as needed. 30 capsule 2  . DULoxetine (CYMBALTA) 30 MG capsule Take 1 capsule (30 mg total) by mouth daily. (Patient taking differently: Take 30 mg by mouth daily. ) 30 capsule 3  . fluticasone (FLONASE) 50 MCG/ACT nasal spray Place 2 sprays into both nostrils daily. (Patient taking differently: Place 2 sprays into both nostrils daily. ) 16 g 2  . lisinopril-hydrochlorothiazide (ZESTORETIC) 20-25 MG tablet Take 1 tablet by mouth daily. 90 tablet 3  . Multiple Vitamin (MULTIVITAMIN) tablet Take 1 tablet by mouth daily.    . pantoprazole (PROTONIX) 40 MG tablet Take 1 tablet (40 mg total) by mouth daily. (Patient taking differently: Take 40 mg by mouth daily. ) 30 tablet 3  . predniSONE (STERAPRED UNI-PAK 21 TAB) 10 MG (21) TBPK tablet Take by mouth.    . promethazine (PHENERGAN) 12.5 MG tablet Take 1 tablet (12.5 mg total) by mouth every 6 (six) hours as needed for nausea or vomiting. 30 tablet 0  . Spacer/Aero-Holding Chambers (AEROCHAMBER MV) inhaler Use as instructed 1 each 0  . SUMAtriptan (IMITREX) 100 MG  tablet Take 1 tablet earliest onset of migraine.  May repeat in 2 hours if headache persists or recurs.  Maximum 2 tablets in 24 hours. (Patient taking differently: Take 100 mg by mouth every 2 (two) hours as needed. Take 1 tablet earliest onset of migraine.  May repeat in 2 hours if headache persists or recurs.  Maximum 2 tablets in 24 hours.) 10 tablet 3   No current facility-administered medications on file prior to visit.    Allergies  Allergen Reactions  . Diphenhydramine Hcl Itching  .  Metoprolol Palpitations    Objective: There were no vitals filed for this visit.  General: No acute distress, AAOx3  Right foot: Incisions healing well, mild swelling to right heel, no erythema, no warmth, no drainage, no signs of infection noted, Capillary fill time <3 seconds in all digits, gross sensation present via light touch to right foot. No pain or crepitation with range of motion right foot.  No pain with calf compression.   Assessment and Plan:  Problem List Items Addressed This Visit   None   Visit Diagnoses    Plantar fasciitis    -  Primary   Relevant Orders   DG Foot Complete Left   S/P foot surgery, right       Relevant Orders   DG Foot Complete Right   Scar painful       Achilles tendinitis, right leg       Heel spur, right       Right foot pain       Bilateral foot pain           -Patient seen and evaluated -Xrays no acute findings -Continue with post op shoe may wean to normal shoe with heel lifts bilateral -Dispensed surgitube compression sleeve to wear on right -Advised patient to ice and elevate as necessary  -Continue with pain meds and PRN meds for sleep; Refill provided this visit. -Will plan for post op check at next office visit. In the meantime, patient to call office if any issues or problems arise.   Asencion Islam, DPM

## 2020-07-31 ENCOUNTER — Encounter: Payer: Self-pay | Admitting: Sports Medicine

## 2020-08-08 DIAGNOSIS — J449 Chronic obstructive pulmonary disease, unspecified: Secondary | ICD-10-CM

## 2020-08-08 HISTORY — DX: Chronic obstructive pulmonary disease, unspecified: J44.9

## 2020-08-27 ENCOUNTER — Ambulatory Visit (INDEPENDENT_AMBULATORY_CARE_PROVIDER_SITE_OTHER): Payer: No Typology Code available for payment source | Admitting: Sports Medicine

## 2020-08-27 ENCOUNTER — Ambulatory Visit: Payer: Medicaid Other | Admitting: Internal Medicine

## 2020-08-27 ENCOUNTER — Encounter: Payer: Self-pay | Admitting: Sports Medicine

## 2020-08-27 ENCOUNTER — Other Ambulatory Visit: Payer: Self-pay

## 2020-08-27 DIAGNOSIS — M722 Plantar fascial fibromatosis: Secondary | ICD-10-CM

## 2020-08-27 DIAGNOSIS — L905 Scar conditions and fibrosis of skin: Secondary | ICD-10-CM

## 2020-08-27 DIAGNOSIS — M7731 Calcaneal spur, right foot: Secondary | ICD-10-CM

## 2020-08-27 DIAGNOSIS — R52 Pain, unspecified: Secondary | ICD-10-CM

## 2020-08-27 DIAGNOSIS — M7661 Achilles tendinitis, right leg: Secondary | ICD-10-CM

## 2020-08-27 DIAGNOSIS — Z9889 Other specified postprocedural states: Secondary | ICD-10-CM

## 2020-08-27 NOTE — Patient Instructions (Signed)
Tonic water: 3-4 oz at bedtime for cramping If cramping is not better after 1 week start taking OTC magnesium (vitamin supplement)

## 2020-08-27 NOTE — Progress Notes (Signed)
Subjective: [name] [name] is a 55 y.o. female patient seen today in office for POV #5 (DOS 06/15/20), S/P excision of painful scar and EPF on right. Patient has nausea, vomiting this AM was drinking last night with her son, denies any other symptoms except some burning and cramping pains that come and go in her feet.No other issues noted.   Patient Active Problem List   Diagnosis Date Noted  . Odontogenic infection of jaw 06/10/2020  . OSA (obstructive sleep apnea) 04/01/2020  . Moderate persistent asthma with acute exacerbation 12/30/2019  . Chronic coughing 12/30/2019  . Seasonal allergies 12/30/2019  . Loud snoring 12/16/2019  . Prediabetes 01/04/2019  . Obesity (BMI 30-39.9) 01/04/2019  . Ganglion cyst 09/28/2018  . Carpal tunnel syndrome of right wrist 09/28/2018  . Chronic midline low back pain without sciatica 11/23/2017  . Essential hypertension 11/23/2017  . Chronic pain of right knee 11/23/2017  . 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 Marijuana abuse 11/23/2017  . Tobacco dependence 11/23/2017  . Alcohol abuse 07/30/2016  . S/P ACL reconstruction 01/31/2014  . DEPRESSION 11/17/2008  . ALLERGIC RHINITIS 11/17/2008  . GERD 11/17/2008    Current Outpatient Medications on File Prior to Visit  Medication Sig Dispense Refill  . acetaminophen (TYLENOL) 500 MG tablet Take 1 tablet (500 mg total) by mouth every 6 (six) hours as needed for up to 30 doses. 30 tablet 0  . albuterol (PROVENTIL) (2.5 MG/3ML) 0.083% nebulizer solution Take 3 mLs (2.5 mg total) by nebulization every 6 (six) hours as needed for wheezing or shortness of breath. 150 mL 1  . albuterol (VENTOLIN HFA) 108 (90 Base) MCG/ACT inhaler Inhale 2 puffs into the lungs every 6 (six) hours as needed for wheezing or shortness of breath. (Patient taking differently: Inhale 2 puffs into the lungs every 6 (six) hours as needed for wheezing or shortness of breath.) 18 g 2  . amLODipine (NORVASC) 10 MG tablet Take 1  tablet (10 mg total) by mouth daily. (Patient taking differently: Take 10 mg by mouth daily.) 30 tablet 6  . Ascorbic Acid (VITAMIN C PO) Take 1 tablet by mouth daily.    Marland Kitchen atorvastatin (LIPITOR) 10 MG tablet Take 1 tablet (10 mg total) by mouth daily. 90 tablet 3  . docusate sodium (COLACE) 100 MG capsule Take 1 capsule (100 mg total) by mouth daily as needed. 30 capsule 2  . DULoxetine (CYMBALTA) 30 MG capsule Take 1 capsule (30 mg total) by mouth daily. (Patient taking differently: Take 30 mg by mouth daily.) 30 capsule 3  . fluticasone (FLONASE) 50 MCG/ACT nasal spray Place 2 sprays into both nostrils daily. (Patient taking differently: Place 2 sprays into both nostrils daily.) 16 g 2  . HYDROcodone-acetaminophen (NORCO/VICODIN) 5-325 MG tablet Take 1 tablet by mouth every 6 (six) hours as needed.    Marland Kitchen ibuprofen (ADVIL) 800 MG tablet Take 1 tablet (800 mg total) by mouth every 6 (six) hours as needed. Take in between doses of pain medication for additional relief or for mild pain 30 tablet 0  . lisinopril-hydrochlorothiazide (ZESTORETIC) 20-25 MG tablet Take 1 tablet by mouth daily. 90 tablet 3  . Multiple Vitamin (MULTIVITAMIN) tablet Take 1 tablet by mouth daily.    . pantoprazole (PROTONIX) 40 MG tablet Take 1 tablet (40 mg total) by mouth daily. (Patient taking differently: Take 40 mg by mouth daily.) 30 tablet 3  . predniSONE (STERAPRED UNI-PAK 21 TAB) 10 MG (21) TBPK tablet Take by mouth.    Marland Kitchen  promethazine (PHENERGAN) 12.5 MG tablet Take 1 tablet (12.5 mg total) by mouth every 6 (six) hours as needed for nausea or vomiting. 30 tablet 0  . Spacer/Aero-Holding Chambers (AEROCHAMBER MV) inhaler Use as instructed 1 each 0  . SUMAtriptan (IMITREX) 100 MG tablet Take 1 tablet earliest onset of migraine.  May repeat in 2 hours if headache persists or recurs.  Maximum 2 tablets in 24 hours. (Patient taking differently: Take 100 mg by mouth every 2 (two) hours as needed. Take 1 tablet earliest onset  of migraine.  May repeat in 2 hours if headache persists or recurs.  Maximum 2 tablets in 24 hours.) 10 tablet 3  . zolpidem (AMBIEN) 5 MG tablet Take 1 tablet (5 mg total) by mouth at bedtime as needed for sleep. 15 tablet 1   No current facility-administered medications on file prior to visit.    Allergies  Allergen Reactions  . Diphenhydramine Hcl Itching  . Metoprolol Palpitations    Objective: There were no vitals filed for this visit.  General: No acute distress, AAOx3  Right foot: Incisions well healed, one cut/loose stitch at achilles with no signs of infection, mild scar with hypertrophy at the edges, mild swelling to right heel, no erythema, no warmth, no drainage, no signs of infection noted, Capillary fill time <3 seconds in all digits, gross sensation present via light touch to right foot. No pain or crepitation with range of motion right foot. Subjective cramping to both feet and burning sensation. No pain with calf compression.   Assessment and Plan:  Problem List Items Addressed This Visit   None   Visit Diagnoses    S/P foot surgery, right    -  Primary   Plantar fasciitis       Scar painful       Achilles tendinitis, right leg       Heel spur, right          -Patient seen and evaluated -There was one prolene stitch that was loose/already cut that patient did not want me to take out; Advised her that it will continue to loosen and if there is any problems to return to office sooner -Continue with OTC scar creams -Advised patient to try tonic water or magnesium for cramping -Educated patient on electrolyte replacement after ETOH intake and recovery -Advised patient burning is from the nerves and should increase Cymbalta but should discuss this increase with her PCP -Will plan for final post op check in 4-6 weeks or sooner if problems arise.  Asencion Islam, DPM

## 2020-09-01 ENCOUNTER — Ambulatory Visit: Payer: Self-pay

## 2020-09-01 ENCOUNTER — Other Ambulatory Visit: Payer: Self-pay

## 2020-09-07 ENCOUNTER — Ambulatory Visit (INDEPENDENT_AMBULATORY_CARE_PROVIDER_SITE_OTHER): Payer: Self-pay | Admitting: Physician Assistant

## 2020-09-07 ENCOUNTER — Ambulatory Visit: Payer: Self-pay | Attending: Internal Medicine

## 2020-09-07 ENCOUNTER — Other Ambulatory Visit: Payer: Self-pay

## 2020-09-07 VITALS — BP 172/113 | HR 68 | Temp 97.3°F | Resp 18 | Ht 64.0 in | Wt 189.0 lb

## 2020-09-07 DIAGNOSIS — F331 Major depressive disorder, recurrent, moderate: Secondary | ICD-10-CM

## 2020-09-07 DIAGNOSIS — M25562 Pain in left knee: Secondary | ICD-10-CM

## 2020-09-07 DIAGNOSIS — M25561 Pain in right knee: Secondary | ICD-10-CM

## 2020-09-07 DIAGNOSIS — G8929 Other chronic pain: Secondary | ICD-10-CM

## 2020-09-07 MED ORDER — DULOXETINE HCL 60 MG PO CPEP: 60.0000 mg | ORAL_CAPSULE | Freq: Every day | ORAL | 1 refills | Status: DC

## 2020-09-07 MED ORDER — HYDROCODONE-ACETAMINOPHEN 5-325 MG PO TABS: 1.0000 | ORAL_TABLET | Freq: Four times a day (QID) | ORAL | 0 refills | Status: AC | PRN

## 2020-09-07 NOTE — Patient Instructions (Addendum)
We are going to increase the Cymbalta and I want you to work on drinking at least 64 ounces of water a day.  I am restarting a referral for you to seen by orthopedics, I also encourage you to call them to make an appointment to see if you are able to get in sooner.  The supplement that I spoke about is called glucosamine  I encourage you to continue using over-the-counter pain relievers, and creams.  I sent a few Norco to your pharmacy to help you with breakthrough, severe pain.  I hope that you feel better soon, please let us know if there is anything else we can do for you  Roney Jaffe, PA-C Physician Assistant [facility] [email or web address]  Osteoarthritis  Osteoarthritis is a type of arthritis. It refers to joint pain or joint disease. Osteoarthritis affects tissue that covers the ends of bones in joints (cartilage). Cartilage acts as a cushion between the bones and helps them move smoothly. Osteoarthritis occurs when cartilage in the joints gets worn down. Osteoarthritis is sometimes called "wear and tear" arthritis. Osteoarthritis is the most common form of arthritis. It often occurs in older people. It is a condition that gets worse over time. The joints most often affected by this condition are in the fingers, toes, hips, knees, and spine, including the neck and lower back. What are the causes? This condition is caused by the wearing down of cartilage that covers the ends of bones. What increases the risk? The following factors may make you more likely to develop this condition:  Being age 1 or older.  Obesity.  Overuse of joints.  Past injury of a joint.  Past surgery on a joint.  Family history of osteoarthritis. What are the signs or symptoms? The main symptoms of this condition are pain, swelling, and stiffness in the joint. Other symptoms may include:  An enlarged joint.  More pain and further damage  caused by small pieces of bone or cartilage that break off and float inside of the joint.  Small deposits of bone (osteophytes) that grow on the edges of the joint.  A grating or scraping feeling inside the joint when you move it.  Popping or creaking sounds when you move.  Difficulty walking or exercising.  An inability to grip items, twist your hand(s), or control the movements of your hands and fingers. How is this diagnosed? This condition may be diagnosed based on:  Your medical history.  A physical exam.  Your symptoms.  X-rays of the affected joint(s).  Blood tests to rule out other types of arthritis. How is this treated? There is no cure for this condition, but treatment can help control pain and improve joint function. Treatment may include a combination of therapies, such as:  Pain relief techniques, such as: ? Applying heat and cold to the joint. ? Massage. ? A form of talk therapy called cognitive behavioral therapy (CBT). This therapy helps you set goals and follow up on the changes that you make.  Medicines for pain and inflammation. The medicines can be taken by mouth or applied to the skin. They include: ? NSAIDs, such as ibuprofen. ? Prescription medicines. ? Strong anti-inflammatory medicines (corticosteroids). ? Certain nutritional supplements.  A prescribed exercise program. You may work with a physical therapist.  Assistive devices, such as a brace, wrap, splint, specialized glove, or cane.  A weight control plan.  Surgery, such as: ? An osteotomy. This is done to reposition the bones  and relieve pain or to remove loose pieces of bone and cartilage. ? Joint replacement surgery. You may need this surgery if you have advanced osteoarthritis. Follow these instructions at home: Activity  Rest your affected joints as told by your health care provider.  Exercise as told by your health care provider. He or she may recommend specific types of exercise,  such as: ? Strengthening exercises. These are done to strengthen the muscles that support joints affected by arthritis. ? Aerobic activities. These are exercises, such as brisk walking or water aerobics, that increase your heart rate. ? Range-of-motion activities. These help your joints move more easily. ? Balance and agility exercises. Managing pain, stiffness, and swelling  If directed, apply heat to the affected area as often as told by your health care provider. Use the heat source that your health care provider recommends, such as a moist heat pack or a heating pad. ? If you have a removable assistive device, remove it as told by your health care provider. ? Place a towel between your skin and the heat source. If your health care provider tells you to keep the assistive device on while you apply heat, place a towel between the assistive device and the heat source. ? Leave the heat on for 20-30 minutes. ? Remove the heat if your skin turns bright red. This is especially important if you are unable to feel pain, heat, or cold. You may have a greater risk of getting burned.  If directed, put ice on the affected area. To do this: ? If you have a removable assistive device, remove it as told by your health care provider. ? Put ice in a plastic bag. ? Place a towel between your skin and the bag. If your health care provider tells you to keep the assistive device on during icing, place a towel between the assistive device and the bag. ? Leave the ice on for 20 minutes, 2-3 times a day. ? Move your fingers or toes often to reduce stiffness and swelling. ? Raise (elevate) the injured area above the level of your heart while you are sitting or lying down.      General instructions  Take over-the-counter and prescription medicines only as told by your health care provider.  Maintain a healthy weight. Follow instructions from your health care provider for weight control.  Do not use any products  that contain nicotine or tobacco, such as cigarettes, e-cigarettes, and chewing tobacco. If you need help quitting, ask your health care provider.  Use assistive devices as told by your health care provider.  Keep all follow-up visits as told by your health care provider. This is important. Where to find more information  [employer] of Arthritis and Musculoskeletal and Skin Diseases: www.niams.http://www.myers.net/  [employer] on Aging: https://walker.com/  American College of Rheumatology: www.rheumatology.org Contact a health care provider if:  You have redness, swelling, or a feeling of warmth in a joint that gets worse.  You have a fever along with joint or muscle aches.  You develop a rash.  You have trouble doing your normal activities. Get help right away if:  You have pain that gets worse and is not relieved by pain medicine. Summary  Osteoarthritis is a type of arthritis that affects tissue covering the ends of bones in joints (cartilage).  This condition is caused by the wearing down of cartilage that covers the ends of bones.  The main symptom of this condition is pain, swelling, and stiffness  in the joint.  There is no cure for this condition, but treatment can help control pain and improve joint function. This information is not intended to replace advice given to you by your health care provider. Make sure you discuss any questions you have with your health care provider. Document Revised: 07/22/2019 Document Reviewed: 07/22/2019 Elsevier Patient Education  2021 [employer].

## 2020-09-07 NOTE — Progress Notes (Signed)
Established Patient Office Visit  Subjective:  Patient ID: [name] RippleStephanie L [name], female    DOB: 1965-10-10  Age: 55 y.o. MRN: [id number]  CC:  Chief Complaint  Patient presents with   Knee Pain    HPI [name] RippleStephanie L [name] reports that she continues to have bilateral knee pain, states that she will have throbbing, swelling, pain levels approximately 8 throughout the day, increasing to 10 at different times.  Reports right knee will swell more than the left.  Reports that she has previously failed cortisone injections, gabapentin, diclofenac, ibuprofen.  Reports that she will use diclofenac cream with a short time of relief.  Reports that she is drinking approximately 2 bottles of water a day.   Reports that the pain keeps her from enjoying her every day activities  Was last seen by orthopedics for right knee pain in October 2021, office note Ortho Exam Right knee shows well-healed surgical scars.  ACL graft feels stable.  Trace joint effusion.  No significant joint line tenderness.  Minimal crepitus with range of motion.  Normal range of motion.  Collaterals and cruciates are stable. Specialty Comments:  No specialty comments available.  Imaging: XR KNEE 3 VIEW RIGHT  Result Date: 05/20/2020 Status post ACL reconstruction with metallic buttons.  Well-preserved joint spaces.   Past Medical History:  Diagnosis Date   Alcohol use disorder, moderate, dependence (HCC)    06-09-2020  per pt not taking naltrexone as prescribed since august 2021, unable to afford it   Allergic rhinitis, seasonal    Bipolar 1 disorder (HCC)    Chronic constipation    Chronic low back pain    Chronic pain of right knee    DDD (degenerative disc disease)    GERD (gastroesophageal reflux disease)    History of 2019 novel coronavirus disease (COVID-19) 05/2019   06-09-2020  per pt residual chronic cough and intermittant sob   History of gastric ulcer    Hyperlipidemia    Hypertension    MDD  (major depressive disorder)    Migraine    Mild obstructive sleep apnea    sleep study in epic 01-22-2020, recommended pt to have cpap titrate ,  per pt due to pain have not gone back    Moderate persistent asthma    followed by pcp   Osteoarthritis    Post-COVID chronic cough    Pre-diabetes    Sciatica    SOB (shortness of breath)    06-09-2020  per pt intermittant due to residual from covid    Wears glasses    Wears glasses     Past Surgical History:  Procedure Laterality Date   BUNIONECTOMY Left 2011   HEEL SPUR SURGERY Right 2012   KNEE ARTHROSCOPY WITH ANTERIOR CRUCIATE LIGAMENT (ACL) REPAIR WITH HAMSTRING GRAFT Right 01/31/2014   Procedure: RIGHT KNEE ARTHROSCOPY WITH ALLOGRAFT (ACL) ANTERIOR CRUCIATE LIGAMENT RECONSTRUCTON PARTIAL MENISCECTOMY VERSES REPAIR ;  Surgeon: Eugenia Mcalpineobert Collins, MD;  [facility];  Service: Orthopedics;  Laterality: Right;   PLANTAR FASCIA RELEASE Right 06/15/2020   Procedure: ENDOSCOPIC PLANTAR FASCIOTOMY WITH EXCISION OF PAINFUL SCAR;  Surgeon: Asencion IslamStover, Titorya, DPM;  Location: [facility];  Service: Podiatry;  Laterality: Right;  LOCAL   TOTAL ABDOMINAL HYSTERECTOMY W/ BILATERAL SALPINGOOPHORECTOMY  04-20-2006    Family History  Problem Relation Age of Onset   Hypertension Father    Breast cancer Maternal Aunt     Social History   Socioeconomic History   Marital status: Teacher, English as a foreign languageLegally  Separated    Spouse name: Not on file   Number of children: Not on file   Years of education: Not on file   Highest education level: Not on file  Occupational History   Not on file  Tobacco Use   Smoking status: Current Every Day Smoker    Packs/day: 0.25    Years: 32.00    Pack years: 8.00    Types: Cigarettes   Smokeless tobacco: Never Used   Tobacco comment: per pt down to 6 cig per day  Vaping Use   Vaping Use: Never used  Substance and Sexual Activity   Alcohol use: Yes    Comment:  alcohol use disorder;  06-09-2020  per pt last drank alcohol 06-06-2020   Drug use: Not Currently    Types: Marijuana    Comment: occasional  marijuana--  hx  "crack" use  last used 2004--  no treatment rehab (done on her own)   Sexual activity: Not on file  Other Topics Concern   Not on file  Social History Narrative   Not on file   Social Determinants of Health   Financial Resource Strain: Not on file  Food Insecurity: Not on file  Transportation Needs: Not on file  Physical Activity: Not on file  Stress: Not on file  Social Connections: Not on file  Intimate Partner Violence: Not on file    Outpatient Medications Prior to Visit  Medication Sig Dispense Refill   acetaminophen (TYLENOL) 500 MG tablet Take 1 tablet (500 mg total) by mouth every 6 (six) hours as needed for up to 30 doses. 30 tablet 0   albuterol (PROVENTIL) (2.5 MG/3ML) 0.083% nebulizer solution Take 3 mLs (2.5 mg total) by nebulization every 6 (six) hours as needed for wheezing or shortness of breath. 150 mL 1   albuterol (VENTOLIN HFA) 108 (90 Base) MCG/ACT inhaler Inhale 2 puffs into the lungs every 6 (six) hours as needed for wheezing or shortness of breath. (Patient taking differently: Inhale 2 puffs into the lungs every 6 (six) hours as needed for wheezing or shortness of breath.) 18 g 2   amLODipine (NORVASC) 10 MG tablet Take 1 tablet (10 mg total) by mouth daily. (Patient taking differently: Take 10 mg by mouth daily.) 30 tablet 6   Ascorbic Acid (VITAMIN C PO) Take 1 tablet by mouth daily.     atorvastatin (LIPITOR) 10 MG tablet Take 1 tablet (10 mg total) by mouth daily. 90 tablet 3   docusate sodium (COLACE) 100 MG capsule Take 1 capsule (100 mg total) by mouth daily as needed. 30 capsule 2   fluticasone (FLONASE) 50 MCG/ACT nasal spray Place 2 sprays into both nostrils daily. (Patient taking differently: Place 2 sprays into both nostrils daily.) 16 g 2   ibuprofen (ADVIL) 800 MG tablet Take 1  tablet (800 mg total) by mouth every 6 (six) hours as needed. Take in between doses of pain medication for additional relief or for mild pain 30 tablet 0   lisinopril-hydrochlorothiazide (ZESTORETIC) 20-25 MG tablet Take 1 tablet by mouth daily. 90 tablet 3   Multiple Vitamin (MULTIVITAMIN) tablet Take 1 tablet by mouth daily.     pantoprazole (PROTONIX) 40 MG tablet Take 1 tablet (40 mg total) by mouth daily. (Patient taking differently: Take 40 mg by mouth daily.) 30 tablet 3   predniSONE (STERAPRED UNI-PAK 21 TAB) 10 MG (21) TBPK tablet Take by mouth.     promethazine (PHENERGAN) 12.5 MG tablet Take 1 tablet (12.5  mg total) by mouth every 6 (six) hours as needed for nausea or vomiting. 30 tablet 0   Spacer/Aero-Holding Chambers (AEROCHAMBER MV) inhaler Use as instructed 1 each 0   SUMAtriptan (IMITREX) 100 MG tablet Take 1 tablet earliest onset of migraine.  May repeat in 2 hours if headache persists or recurs.  Maximum 2 tablets in 24 hours. (Patient taking differently: Take 100 mg by mouth every 2 (two) hours as needed. Take 1 tablet earliest onset of migraine.  May repeat in 2 hours if headache persists or recurs.  Maximum 2 tablets in 24 hours.) 10 tablet 3   zolpidem (AMBIEN) 5 MG tablet Take 1 tablet (5 mg total) by mouth at bedtime as needed for sleep. 15 tablet 1   DULoxetine (CYMBALTA) 30 MG capsule Take 1 capsule (30 mg total) by mouth daily. (Patient taking differently: Take 30 mg by mouth daily.) 30 capsule 3   HYDROcodone-acetaminophen (NORCO/VICODIN) 5-325 MG tablet Take 1 tablet by mouth every 6 (six) hours as needed.     No facility-administered medications prior to visit.    Allergies  Allergen Reactions   Diphenhydramine Hcl Itching   Metoprolol Palpitations    ROS Review of Systems  Constitutional: Negative.   HENT: Negative.   Eyes: Negative.   Respiratory: Negative.   Cardiovascular: Negative.   Gastrointestinal: Negative.   Endocrine: Negative.    Genitourinary: Negative.   Musculoskeletal: Positive for arthralgias and joint swelling.  Skin: Negative.   Allergic/Immunologic: Negative.   Neurological: Negative.   Hematological: Negative.   Psychiatric/Behavioral: Negative.       Objective:    Physical Exam Constitutional:      Appearance: Normal appearance.  HENT:     Head: Normocephalic and atraumatic.     Right Ear: External ear normal.     Left Ear: External ear normal.     Nose: Nose normal.     Mouth/Throat:     Mouth: Mucous membranes are moist.     Pharynx: Oropharynx is clear.  Eyes:     Extraocular Movements: Extraocular movements intact.     Conjunctiva/sclera: Conjunctivae normal.     Pupils: Pupils are equal, round, and reactive to light.  Cardiovascular:     Rate and Rhythm: Regular rhythm.     Pulses: Normal pulses.     Heart sounds: Normal heart sounds.  Pulmonary:     Effort: Pulmonary effort is normal.     Breath sounds: Normal breath sounds.  Musculoskeletal:     Cervical back: Normal range of motion and neck supple.     Right knee: Swelling and crepitus present. Tenderness present.     Left knee: Swelling and crepitus present. Tenderness present.     Comments: Slight  swelling bilaterally  Skin:    General: Skin is warm and dry.  Neurological:     General: No focal deficit present.     Mental Status: She is alert and oriented to person, place, and time.  Psychiatric:        Mood and Affect: Mood normal.        Behavior: Behavior normal.        Thought Content: Thought content normal.        Judgment: Judgment normal.     BP (!) 172/113 (BP Location: Left Arm, Patient Position: Sitting, Cuff Size: Normal)    Pulse 68    Temp (!) 97.3 F (36.3 C) (Oral)    Resp 18    Ht 5\' 4"  (1.626 m)  Wt 189 lb (85.7 kg)    SpO2 97%    BMI 32.44 kg/m  Wt Readings from Last 3 Encounters:  09/07/20 189 lb (85.7 kg)  06/15/20 186 lb 9.6 oz (84.6 kg)  06/10/20 193 lb (87.5 kg)     Health  Maintenance Due  Topic Date Due   Hepatitis C Screening  Never done   PNEUMOCOCCAL POLYSACCHARIDE VACCINE AGE 71-64 HIGH RISK  Never done   FOOT EXAM  Never done   OPHTHALMOLOGY EXAM  Never done   COVID-19 Vaccine (1) Never done   PAP SMEAR-Modifier  Never done   COLONOSCOPY (Pts 45-64yrs Insurance coverage will need to be confirmed)  Never done    There are no preventive care reminders to display for this patient.  Lab Results  Component Value Date   TSH 0.502 07/31/2016   Lab Results  Component Value Date   WBC 6.1 12/16/2019   HGB 13.9 06/15/2020   HCT 41.0 06/15/2020   MCV 90 12/16/2019   PLT 363 12/16/2019   Lab Results  Component Value Date   NA 139 06/15/2020   K 3.5 06/15/2020   CO2 25 12/19/2019   GLUCOSE 107 (H) 06/15/2020   BUN 18 06/15/2020   CREATININE 0.90 06/15/2020   BILITOT <0.2 03/30/2020   ALKPHOS 85 03/30/2020   AST 28 03/30/2020   ALT 21 03/30/2020   PROT 7.8 03/30/2020   ALBUMIN 4.3 03/30/2020   CALCIUM 9.4 12/19/2019   ANIONGAP 10 12/19/2019   Lab Results  Component Value Date   CHOL 232 (H) 12/16/2019   Lab Results  Component Value Date   HDL 44 12/16/2019   Lab Results  Component Value Date   LDLCALC 152 (H) 12/16/2019   Lab Results  Component Value Date   TRIG 195 (H) 12/16/2019   Lab Results  Component Value Date   CHOLHDL 5.3 (H) 12/16/2019   Lab Results  Component Value Date   HGBA1C 5.9 (H) 03/30/2020      Assessment & Plan:   Problem List Items Addressed This Visit      Other   Chronic pain of right knee - Primary   Relevant Medications   HYDROcodone-acetaminophen (NORCO/VICODIN) 5-325 MG tablet   Other Relevant Orders   Ambulatory referral to Orthopedic Surgery    Other Visit Diagnoses    Moderate episode of recurrent major depressive disorder (HCC)       Relevant Medications   DULoxetine (CYMBALTA) 60 MG capsule   Chronic pain of left knee       Relevant Medications   DULoxetine (CYMBALTA) 60  MG capsule   HYDROcodone-acetaminophen (NORCO/VICODIN) 5-325 MG tablet   Other Relevant Orders   Ambulatory referral to Orthopedic Surgery      Meds ordered this encounter  Medications   DULoxetine (CYMBALTA) 60 MG capsule    Sig: Take 1 capsule (60 mg total) by mouth daily.    Dispense:  30 capsule    Refill:  1    Order Specific Question:   Supervising Provider    Answer:   Storm Frisk [[id number]]   HYDROcodone-acetaminophen (NORCO/VICODIN) 5-325 MG tablet    Sig: Take 1 tablet by mouth every 6 (six) hours as needed for up to 3 days.    Dispense:  6 tablet    Refill:  0    Order Specific Question:   Supervising Provider    Answer:   WRIGHT, PATRICK E [[id number]]  1. Moderate episode of recurrent major depressive disorder (  HCC) Increase in Cymbalta 60 mg, patient education given on benefits of increase - DULoxetine (CYMBALTA) 60 MG capsule; Take 1 capsule (60 mg total) by mouth daily.  Dispense: 30 capsule; Refill: 1  2. Chronic pain of right knee Continue over-the-counter pain relievers, go for severe breakthrough pain.  Check of [place] controlled substance registry within normal limits - HYDROcodone-acetaminophen (NORCO/VICODIN) 5-325 MG tablet; Take 1 tablet by mouth every 6 (six) hours as needed for up to 3 days.  Dispense: 6 tablet; Refill: 0 - Ambulatory referral to Orthopedic Surgery  3. Chronic pain of left knee  - HYDROcodone-acetaminophen (NORCO/VICODIN) 5-325 MG tablet; Take 1 tablet by mouth every 6 (six) hours as needed for up to 3 days.  Dispense: 6 tablet; Refill: 0 - Ambulatory referral to Orthopedic Surgery   I have reviewed the patient's medical history (PMH, PSH, Social History, Family History, Medications, and allergies) , and have been updated if relevant. I spent 30 minutes reviewing chart and  face to face time with patient.     Follow-up: Return if symptoms worsen or fail to improve.    Kasandra Knudsen Mayers, PA-C

## 2020-09-07 NOTE — Progress Notes (Signed)
Patient has not taken medication today. Patient complains of chronic bilateral pain. Patient shares pain is increased in the morning with consistent pain of 8 throughout the day.

## 2020-09-08 ENCOUNTER — Encounter: Payer: Self-pay | Admitting: Physician Assistant

## 2020-09-22 ENCOUNTER — Ambulatory Visit: Payer: Medicaid Other | Admitting: Internal Medicine

## 2020-09-24 ENCOUNTER — Ambulatory Visit (INDEPENDENT_AMBULATORY_CARE_PROVIDER_SITE_OTHER): Payer: Medicaid Other | Admitting: Orthopaedic Surgery

## 2020-09-24 ENCOUNTER — Encounter: Payer: Self-pay | Admitting: Orthopaedic Surgery

## 2020-09-24 DIAGNOSIS — M170 Bilateral primary osteoarthritis of knee: Secondary | ICD-10-CM

## 2020-09-24 MED ORDER — HYDROCODONE-ACETAMINOPHEN 5-325 MG PO TABS: 1.0000 | ORAL_TABLET | Freq: Every day | ORAL | 0 refills | Status: DC | PRN

## 2020-09-24 NOTE — Progress Notes (Signed)
Office Visit Note   Patient: [name] [name]           Date of Birth: [birth year]           MRN: [id number] Visit Date: 09/24/2020              Requested by: Mayers, Kasandra Knudsen, PA-C [address] [place],  [place] [address] PCP: Marcine Matar, MD   Assessment & Plan: Visit Diagnoses:  1. Bilateral primary osteoarthritis of knee     Plan: Impression is bilateral knee arthritis flareup.  We have again discussed various treatment options to include cortisone injection versus viscosupplementation injection.  The patient is not interested in either.  She would rather take medications but notes that she is unable to take NSAIDs due to recent GI ulcer.  Tramadol does nothing for her.  Have agreed to call in one very small prescription of Norco that she understands would not be refilled.  She will follow up with [place] as needed.  Follow-Up Instructions: Return if symptoms worsen or fail to improve.   Orders:  No orders of the defined types were placed in this encounter.  Meds ordered this encounter  Medications  . HYDROcodone-acetaminophen (NORCO) 5-325 MG tablet    Sig: Take 1 tablet by mouth daily as needed.    Dispense:  10 tablet    Refill:  0      Procedures: No procedures performed   Clinical Data: No additional findings.   Subjective: Chief Complaint  Patient presents with  . Left Knee - Pain  . Right Knee - Pain    HPI patient is a 55 year old female who comes in today with bilateral knee pain right greater than left.  She does have a history of osteoarthritis to both knees.  She has been seen in the past where cortisone injections were performed without long-lasting relief of symptoms.  She was last seen by [place] back in the fall where she she was sent to outpatient physical therapy and tried a course of NSAIDs.  She denies any relief from the above.  The pain she has is to the entire knee and does radiate into the top of the thigh with associated numbness.   She has popping and weakness to the right lower extremity.  She has increased pain to the knee when she is going up and down stairs.  She takes Tylenol and takes Epson salt baths without relief.  Does have a history of degenerative disc disease and notes that she need surgical intervention but is unable to stop smoking.  She is also unable to take NSAIDs due to recent GI ulcer.  Review of Systems as detailed in HPI.  All others reviewed and are negative.   Objective: Vital Signs: There were no vitals taken for this visit.  Physical Exam well-developed well-nourished female no acute distress.  Alert oriented x3.  Ortho Exam bilateral knee exam shows no effusion.  Range of motion 0 to 120 degrees.  Medial and lateral joint line tenderness.  Mild patellofemoral crepitus.  Negative logroll and negative FADIR.  Negative straight leg raise.  She is neurovascular intact distally.  Specialty Comments:  No specialty comments available.  Imaging: No new imaging   PMFS History: Patient Active Problem List   Diagnosis Date Noted  . Odontogenic infection of jaw 06/10/2020  . OSA (obstructive sleep apnea) 04/01/2020  . Moderate persistent asthma with acute exacerbation 12/30/2019  . Chronic coughing 12/30/2019  . Seasonal allergies  12/30/2019  . Loud snoring 12/16/2019  . Prediabetes 01/04/2019  . Obesity (BMI 30-39.9) 01/04/2019  . Ganglion cyst 09/28/2018  . Carpal tunnel syndrome of right wrist 09/28/2018  . Chronic midline low back pain without sciatica 11/23/2017  . Essential hypertension 11/23/2017  . Chronic pain of right knee 11/23/2017  . 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 Marijuana abuse 11/23/2017  . Tobacco dependence 11/23/2017  . Alcohol abuse 07/30/2016  . S/P ACL reconstruction 01/31/2014  . DEPRESSION 11/17/2008  . ALLERGIC RHINITIS 11/17/2008  . GERD 11/17/2008   Past Medical History:  Diagnosis Date  . Alcohol use disorder, moderate, dependence (HCC)     06-09-2020  per pt not taking naltrexone as prescribed since august 2021, unable to afford it  . Allergic rhinitis, seasonal   . Bipolar 1 disorder (HCC)   . Chronic constipation   . Chronic low back pain   . Chronic pain of right knee   . DDD (degenerative disc disease)   . GERD (gastroesophageal reflux disease)   . History of 2019 novel coronavirus disease (COVID-19) 05/2019   06-09-2020  per pt residual chronic cough and intermittant sob  . History of gastric ulcer   . Hyperlipidemia   . Hypertension   . MDD (major depressive disorder)   . Migraine   . Mild obstructive sleep apnea    sleep study in epic 01-22-2020, recommended pt to have cpap titrate ,  per pt due to pain have not gone back   . Moderate persistent asthma    followed by pcp  . Osteoarthritis   . Post-COVID chronic cough   . Pre-diabetes   . Sciatica   . SOB (shortness of breath)    06-09-2020  per pt intermittant due to residual from covid   . Wears glasses   . Wears glasses     Family History  Problem Relation Age of Onset  . Hypertension Father   . Breast cancer Maternal Aunt     Past Surgical History:  Procedure Laterality Date  . BUNIONECTOMY Left 2011  . HEEL SPUR SURGERY Right 2012  . KNEE ARTHROSCOPY WITH ANTERIOR CRUCIATE LIGAMENT (ACL) REPAIR WITH HAMSTRING GRAFT Right 01/31/2014   Procedure: RIGHT KNEE ARTHROSCOPY WITH ALLOGRAFT (ACL) ANTERIOR CRUCIATE LIGAMENT RECONSTRUCTON PARTIAL MENISCECTOMY VERSES REPAIR ;  Surgeon: Eugenia Mcalpine, MD;  Location: [facility];  Service: Orthopedics;  Laterality: Right;  . PLANTAR FASCIA RELEASE Right 06/15/2020   Procedure: ENDOSCOPIC PLANTAR FASCIOTOMY WITH EXCISION OF PAINFUL SCAR;  Surgeon: Asencion Islam, DPM;  Location: [facility];  Service: Podiatry;  Laterality: Right;  LOCAL  . TOTAL ABDOMINAL HYSTERECTOMY W/ BILATERAL SALPINGOOPHORECTOMY  04-20-2006   Social History   Occupational History  . Not on file  Tobacco Use   . Smoking status: Current Every Day Smoker    Packs/day: 0.25    Years: 32.00    Pack years: 8.00    Types: Cigarettes  . Smokeless tobacco: Never Used  . Tobacco comment: per pt down to 6 cig per day  Vaping Use  . Vaping Use: Never used  Substance and Sexual Activity  . Alcohol use: Yes    Comment: alcohol use disorder;  06-09-2020  per pt last drank alcohol 06-06-2020  . Drug use: Not Currently    Types: Marijuana    Comment: occasional  marijuana--  hx  "crack" use  last used 2004--  no treatment rehab (done on her own)  . Sexual activity: Not on file

## 2020-10-01 ENCOUNTER — Other Ambulatory Visit: Payer: Self-pay

## 2020-10-01 ENCOUNTER — Encounter: Payer: Self-pay | Admitting: Sports Medicine

## 2020-10-01 ENCOUNTER — Telehealth: Payer: Self-pay

## 2020-10-01 ENCOUNTER — Ambulatory Visit (INDEPENDENT_AMBULATORY_CARE_PROVIDER_SITE_OTHER): Payer: Self-pay | Admitting: Sports Medicine

## 2020-10-01 DIAGNOSIS — M722 Plantar fascial fibromatosis: Secondary | ICD-10-CM

## 2020-10-01 DIAGNOSIS — L905 Scar conditions and fibrosis of skin: Secondary | ICD-10-CM

## 2020-10-01 DIAGNOSIS — Z9889 Other specified postprocedural states: Secondary | ICD-10-CM

## 2020-10-01 DIAGNOSIS — M7661 Achilles tendinitis, right leg: Secondary | ICD-10-CM

## 2020-10-01 DIAGNOSIS — R52 Pain, unspecified: Secondary | ICD-10-CM

## 2020-10-01 MED ORDER — IBUPROFEN 800 MG PO TABS
800.0000 mg | ORAL_TABLET | Freq: Four times a day (QID) | ORAL | 0 refills | Status: DC | PRN
Start: 2020-10-01 — End: 2023-01-16

## 2020-10-01 NOTE — Telephone Encounter (Signed)
Received [employer] application from patient for Monovisc, bilateral knee. °Faxed completed [employer] application to [phone]. °

## 2020-10-01 NOTE — Progress Notes (Signed)
Subjective: [name] [name] is a 55 y.o. female patient seen today in office for POV #6 (DOS 06/15/20), S/P excision of painful scar and EPF on right. Patient states that she has a lot of sharp shooting pains now in both feet and also painful at the back of the achilles scar that is painful even with light touch.No other issues noted.   Patient Active Problem List   Diagnosis Date Noted  . Odontogenic infection of jaw 06/10/2020  . OSA (obstructive sleep apnea) 04/01/2020  . Moderate persistent asthma with acute exacerbation 12/30/2019  . Chronic coughing 12/30/2019  . Seasonal allergies 12/30/2019  . Loud snoring 12/16/2019  . Prediabetes 01/04/2019  . Obesity (BMI 30-39.9) 01/04/2019  . Ganglion cyst 09/28/2018  . Carpal tunnel syndrome of right wrist 09/28/2018  . Chronic midline low back pain without sciatica 11/23/2017  . Essential hypertension 11/23/2017  . Chronic pain of right knee 11/23/2017  . 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 Marijuana abuse 11/23/2017  . Tobacco dependence 11/23/2017  . Alcohol abuse 07/30/2016  . S/P ACL reconstruction 01/31/2014  . DEPRESSION 11/17/2008  . ALLERGIC RHINITIS 11/17/2008  . GERD 11/17/2008    Current Outpatient Medications on File Prior to Visit  Medication Sig Dispense Refill  . acetaminophen (TYLENOL) 500 MG tablet Take 1 tablet (500 mg total) by mouth every 6 (six) hours as needed for up to 30 doses. 30 tablet 0  . albuterol (PROVENTIL) (2.5 MG/3ML) 0.083% nebulizer solution Take 3 mLs (2.5 mg total) by nebulization every 6 (six) hours as needed for wheezing or shortness of breath. 150 mL 1  . albuterol (VENTOLIN HFA) 108 (90 Base) MCG/ACT inhaler Inhale 2 puffs into the lungs every 6 (six) hours as needed for wheezing or shortness of breath. (Patient taking differently: Inhale 2 puffs into the lungs every 6 (six) hours as needed for wheezing or shortness of breath.) 18 g 2  . amLODipine (NORVASC) 10 MG tablet Take 1  tablet (10 mg total) by mouth daily. (Patient taking differently: Take 10 mg by mouth daily.) 30 tablet 6  . Ascorbic Acid (VITAMIN C PO) Take 1 tablet by mouth daily.    Marland Kitchen atorvastatin (LIPITOR) 10 MG tablet Take 1 tablet (10 mg total) by mouth daily. 90 tablet 3  . docusate sodium (COLACE) 100 MG capsule Take 1 capsule (100 mg total) by mouth daily as needed. 30 capsule 2  . DULoxetine (CYMBALTA) 60 MG capsule Take 1 capsule (60 mg total) by mouth daily. 30 capsule 1  . fluticasone (FLONASE) 50 MCG/ACT nasal spray Place 2 sprays into both nostrils daily. (Patient taking differently: Place 2 sprays into both nostrils daily.) 16 g 2  . HYDROcodone-acetaminophen (NORCO) 5-325 MG tablet Take 1 tablet by mouth daily as needed. 10 tablet 0  . lisinopril-hydrochlorothiazide (ZESTORETIC) 20-25 MG tablet Take 1 tablet by mouth daily. 90 tablet 3  . Multiple Vitamin (MULTIVITAMIN) tablet Take 1 tablet by mouth daily.    . pantoprazole (PROTONIX) 40 MG tablet Take 1 tablet (40 mg total) by mouth daily. (Patient taking differently: Take 40 mg by mouth daily.) 30 tablet 3  . predniSONE (STERAPRED UNI-PAK 21 TAB) 10 MG (21) TBPK tablet Take by mouth.    . promethazine (PHENERGAN) 12.5 MG tablet Take 1 tablet (12.5 mg total) by mouth every 6 (six) hours as needed for nausea or vomiting. 30 tablet 0  . Spacer/Aero-Holding Chambers (AEROCHAMBER MV) inhaler Use as instructed 1 each 0  . SUMAtriptan (IMITREX) 100 MG  tablet Take 1 tablet earliest onset of migraine.  May repeat in 2 hours if headache persists or recurs.  Maximum 2 tablets in 24 hours. (Patient taking differently: Take 100 mg by mouth every 2 (two) hours as needed. Take 1 tablet earliest onset of migraine.  May repeat in 2 hours if headache persists or recurs.  Maximum 2 tablets in 24 hours.) 10 tablet 3  . zolpidem (AMBIEN) 5 MG tablet Take 1 tablet (5 mg total) by mouth at bedtime as needed for sleep. 15 tablet 1   No current facility-administered  medications on file prior to visit.    Allergies  Allergen Reactions  . Diphenhydramine Hcl Itching  . Metoprolol Palpitations    Objective: There were no vitals filed for this visit.  General: No acute distress, AAOx3  Right foot: Incisions well healed, mild scar with hypertrophy at the edges, mild swelling to right heel, no erythema, no warmth, no drainage, no signs of infection noted, Capillary fill time <3 seconds in all digits, gross sensation present via light touch to right foot. No pain or crepitation with range of motion right foot. Subjective cramping to both feet and burning sensation unchanged from previous. No pain with calf compression.   Assessment and Plan:  Problem List Items Addressed This Visit   None   Visit Diagnoses    S/P foot surgery, right    -  Primary   Relevant Orders   Ambulatory referral to Physical Therapy   Plantar fasciitis       Relevant Orders   Ambulatory referral to Physical Therapy   Scar painful       Relevant Orders   Ambulatory referral to Physical Therapy   Achilles tendinitis, right leg       Relevant Orders   Ambulatory referral to Physical Therapy      -Patient seen and evaluated -Rx PT to help with pain swelling and scar management -Continue with OTC scar creams -Advised patient to try tonic water or magnesium for cramping and to increase her nerve medication to see if this will help -Refilled Motrin -Recommend rest, ice, elevation -Will plan for recheck after she has finished PT or sooner if problems arise.  Asencion Islam, DPM

## 2020-10-13 ENCOUNTER — Telehealth: Payer: Self-pay | Admitting: Sports Medicine

## 2020-10-13 ENCOUNTER — Telehealth: Payer: Self-pay

## 2020-10-13 NOTE — Telephone Encounter (Signed)
This is nerve pain (pins and needles) she should talk to her pcp about Cymbalta to see if they can increase the dose or change her to a different medication for her nerves. -Dr. Kathie Rhodes

## 2020-10-13 NOTE — Telephone Encounter (Signed)
Patient called and stated that she is still having problems with her right foot, she states it feels like a whole bunch of pins sticking in the bottom of her foot. She wanted to know what she could to do to ease the pain.

## 2020-10-13 NOTE — Telephone Encounter (Signed)
Received approval letter and PRF form from [employer] for Monovisc, bilateral knee.   Faxed completed PRF form to [employer] at [phone]. 

## 2020-10-13 NOTE — Telephone Encounter (Signed)
She can ask PCP, what else to do since there is nothing more that I can safely put her on for the nerves. They may want to see her sooner or they may change to a different medication since previously increasing Cymbalta made her sick

## 2020-10-13 NOTE — Telephone Encounter (Signed)
Patient stated that she doesn't have an appointment with her pcp until 03/30; She also stated that her dosage of the Cymbalta was increased and it made her sick to her stomach. Is there anything else she can do?

## 2020-10-14 ENCOUNTER — Ambulatory Visit: Payer: Self-pay | Attending: Sports Medicine

## 2020-10-14 ENCOUNTER — Other Ambulatory Visit: Payer: Self-pay | Admitting: Internal Medicine

## 2020-10-14 ENCOUNTER — Telehealth: Payer: Self-pay | Admitting: Internal Medicine

## 2020-10-14 ENCOUNTER — Other Ambulatory Visit: Payer: Self-pay

## 2020-10-14 DIAGNOSIS — G8929 Other chronic pain: Secondary | ICD-10-CM

## 2020-10-14 DIAGNOSIS — M25561 Pain in right knee: Secondary | ICD-10-CM | POA: Insufficient documentation

## 2020-10-14 DIAGNOSIS — R2689 Other abnormalities of gait and mobility: Secondary | ICD-10-CM

## 2020-10-14 DIAGNOSIS — R600 Localized edema: Secondary | ICD-10-CM

## 2020-10-14 DIAGNOSIS — M25572 Pain in left ankle and joints of left foot: Secondary | ICD-10-CM

## 2020-10-14 DIAGNOSIS — M6281 Muscle weakness (generalized): Secondary | ICD-10-CM

## 2020-10-14 DIAGNOSIS — M25671 Stiffness of right ankle, not elsewhere classified: Secondary | ICD-10-CM

## 2020-10-14 DIAGNOSIS — M25571 Pain in right ankle and joints of right foot: Secondary | ICD-10-CM

## 2020-10-14 DIAGNOSIS — M25562 Pain in left knee: Secondary | ICD-10-CM | POA: Insufficient documentation

## 2020-10-14 MED ORDER — CELECOXIB 200 MG PO CAPS: 200.0000 mg | ORAL_CAPSULE | Freq: Every day | ORAL | 1 refills | Status: DC

## 2020-10-14 NOTE — Telephone Encounter (Signed)
Pt was prescribed DULoxetine (CYMBALTA) 60 MG capsule By Maurene Capes for Knee pain/arthritis / Pt stated it made her vomit and light headed and she wanted to know if Dr. Laural Benes can change the dosage or prescribe something else that wont make her sick / Pt has an appt schedule for 3.30.22 with Zelda and wanted to know if she needed to be seen for the change of medication and if so is there anything any sooner/ please advise asap

## 2020-10-14 NOTE — Therapy (Signed)
[facility] [address] [place], [place], [address] Phone: [phone]   Fax:  [phone]  Physical Therapy Evaluation  Patient Details  Name: [name] [name] MRN: [id number] Date of Birth: [birth year] Referring Provider (PT): Marylene Land DPM   Encounter Date: 10/14/2020   PT End of Session - 10/14/20 1131    Visit Number 1    Number of Visits 17    Date for PT Re-Evaluation 12/09/20    Authorization Type CAFA    PT Start Time 1100    PT Stop Time 1140    PT Time Calculation (min) 40 min    Activity Tolerance Patient tolerated treatment well;Patient limited by pain    Behavior During Therapy [facility] for tasks assessed/performed           Past Medical History:  Diagnosis Date  . Alcohol use disorder, moderate, dependence (HCC)    06-09-2020  per pt not taking naltrexone as prescribed since august 2021, unable to afford it  . Allergic rhinitis, seasonal   . Bipolar 1 disorder (HCC)   . Chronic constipation   . Chronic low back pain   . Chronic pain of right knee   . DDD (degenerative disc disease)   . GERD (gastroesophageal reflux disease)   . History of 2019 novel coronavirus disease (COVID-19) 05/2019   06-09-2020  per pt residual chronic cough and intermittant sob  . History of gastric ulcer   . Hyperlipidemia   . Hypertension   . MDD (major depressive disorder)   . Migraine   . Mild obstructive sleep apnea    sleep study in epic 01-22-2020, recommended pt to have cpap titrate ,  per pt due to pain have not gone back   . Moderate persistent asthma    followed by pcp  . Osteoarthritis   . Post-COVID chronic cough   . Pre-diabetes   . Sciatica   . SOB (shortness of breath)    06-09-2020  per pt intermittant due to residual from covid   . Wears glasses   . Wears glasses     Past Surgical History:  Procedure Laterality Date  . BUNIONECTOMY Left 2011  . HEEL SPUR SURGERY Right 2012  . KNEE ARTHROSCOPY WITH  ANTERIOR CRUCIATE LIGAMENT (ACL) REPAIR WITH HAMSTRING GRAFT Right 01/31/2014   Procedure: RIGHT KNEE ARTHROSCOPY WITH ALLOGRAFT (ACL) ANTERIOR CRUCIATE LIGAMENT RECONSTRUCTON PARTIAL MENISCECTOMY VERSES REPAIR ;  Surgeon: Eugenia Mcalpine, MD;  Location: [facility];  Service: Orthopedics;  Laterality: Right;  . PLANTAR FASCIA RELEASE Right 06/15/2020   Procedure: ENDOSCOPIC PLANTAR FASCIOTOMY WITH EXCISION OF PAINFUL SCAR;  Surgeon: Asencion Islam, DPM;  Location: [facility];  Service: Podiatry;  Laterality: Right;  LOCAL  . TOTAL ABDOMINAL HYSTERECTOMY W/ BILATERAL SALPINGOOPHORECTOMY  04-20-2006    There were no vitals filed for this visit.    Subjective Assessment - 10/14/20 1103    Subjective 06/15/2020 R r foot surgery: right excision of painful scar and endoscopic release of plantar fascia on right. Pt report having a heel spur that was removed as well at this time. Over past few weeks bottom of foot has been feeling like pins and needles (reports hx neuropathy). Sometimes hard to get feet adjusted to the floor. Has been experiencing alot of pain on sides and bottom off foot feels squeezing like pain. Has a gel sock but reports not much relief    Pertinent History HTN, Surgery on R knee x2 for OA a  few yrs ago. OA B knees and back. Hostory of substance abuse sober 18 years, neuropathy    Patient Stated Goals To have less pain, be able to do steps, walk without as much pain    Currently in Pain? Yes    Pain Score 7    worst - 10/10. Never lower than a 7.   Pain Location Foot    Pain Orientation Right;Lateral;Medial;Distal;Other (Comment)   Sole of foot   Pain Descriptors / Indicators Squeezing;Tightness;Cramping;Pins and needles    Pain Type Acute pain    Pain Radiating Towards towards upper part of lower leg    Pain Onset More than a month ago    Pain Frequency Constant    Aggravating Factors  stairs, walking around, worse at night    Pain Relieving Factors  tylenol/ibuprofen (history of substance abuse - reports not liking to take pills), will use heat or ice with minimla relief              [facility] PT Assessment - 10/14/20 1105      Assessment   Medical Diagnosis Z98.890 (ICD-10-CM) - S/P foot surgery, right  M72.2 (ICD-10-CM) - Plantar fasciitis  R52,L90.5 (ICD-10-CM) - Scar painful  M76.61 (ICD-10-CM) - Achilles tendinitis, right leg    Referring Provider (PT) Marylene LandStover DPM    Hand Dominance Left    Next MD Visit Not yet scheduled      Home Environment   Additional Comments Lives alone in apartment with steps 6 x 2 to enter with R HR. Very aggravating and increased pain in both knees and ankles to go up/down stairs      Prior Function   Vocation --   Not working currently   Leisure Stays home, sedentary, helps older aunt in her 6570s      Cognition   Overall Cognitive Status Within Functional Limits for tasks assessed      Observation/Other Assessments   Observations vertical scar posterolateral R ankle, scar on medial ankle and very smal incision/scar on lateral aspect of foot all  well healing but with tightness/decr soft tissuemobility. Right ankle edema noted.      Observation/Other Assessments-Edema    Edema --      Functional Tests   Functional tests Sit to Stand      Sit to Stand   Comments Weight shifted left, uses BUE to assist      ROM / Strength   AROM / PROM / Strength AROM;PROM;Strength      AROM   Overall AROM Comments DF: R -20 (passive -18) L 0 (+5 Passive). B knee flex/ext [facility] but very painful      Strength   Overall Strength Comments Grossly 3+/5 B hips and ankles, 4-/5 B knees. Painful with resisted motion on right >left at knee and ankle      Flexibility   Soft Tissue Assessment /Muscle Length yes   B calf tightness R>>>>L. HS tightness B     Palpation   Palpation comment minimal TTP R calf - reports getting cramps at sole of foot especially at night. TTP medial lateral and posterior R ankle.       Ambulation/Gait   Gait Pattern Antalgic   decr WS right, hip drop B   Gait velocity slow    Stairs Yes    Stairs Assistance 6: Modified independent (Device/Increase time)    Stair Management Technique One rail Right   ascend step to left lead, descend step to right lead. limited by knee  pain. 4" and 6"                     Objective measurements completed on examination: See above findings.               PT Education - 10/14/20 1142    Education Details PT POC and initial HEP. Access Code: [email or web address]  URL: [email or web address]  Date: 10/14/2020  Prepared by: Claude Manges    Exercises  Supine Ankle Pumps - 1 x daily - 7 x weekly - 3 sets - 10 reps  Seated Quad Set - 1 x daily - 7 x weekly - 10 reps - 10 seconds hold  Seated Toe Raise - 1 x daily - 7 x weekly - 2 sets - 10 reps  Seated March - 1 x daily - 7 x weekly - 3 sets - 10 reps  Gastroc Stretch on Wall - 1 x daily - 7 x weekly - 4 reps - 20-30 second each hold    Person(s) Educated Patient    Methods Explanation;Demonstration;Handout    Comprehension Verbalized understanding;Returned demonstration            PT Short Term Goals - 10/14/20 1151      PT SHORT TERM GOAL #1   Title Pt will be independent with initial HEP    Time 2    Period Weeks    Status New    Target Date 10/28/20             PT Long Term Goals - 10/14/20 1151      PT LONG TERM GOAL #1   Title Pt will be independent with advanced HEP    Time 8    Period Weeks    Status New    Target Date 12/09/20      PT LONG TERM GOAL #2   Title Pt will demo R ankle ROM symmetrical to left with </= 1/10 pain    Time 8    Period Weeks    Status New    Target Date 12/09/20      PT LONG TERM GOAL #3   Title Pt will be able to negotiate 12 steps with safe alternitng pattern and  report </= 2/10 pain at knees or R ankle.    Time 8    Period Weeks    Status New    Target Date 12/09/20      PT LONG TERM GOAL #4   Title  BLE strength grossly >/= 4+/5    Time 8    Period Weeks    Status New    Target Date 12/09/20                  Plan - 10/14/20 1143    Clinical Impression Statement Pt is a 55 yo female s/p R foot surgery 06/15/2020 who presents with R foot pain with history of R plantar fasciitis and achilles tendonitis. PMH significant for B knee OA in addition to left ankle pain as well, HTN, neuropathy. She currently presents with pain and edema of the right ankle, decreased soft tissue mobility on right ankle, Decreased R ankle ROM, Painful and weak B knee ROM, decreased flexibility and abnormal antalgic gait. As a result pt reports significant difficulty with negotiating stairs to get in and out of home, walk around ad report occcasiionally losing balance because of pain. She will benefit from skilled physical therapy at this time to address the aforementioned impairments, decrease pain  and improve functional strength and mobility    Personal Factors and Comorbidities Comorbidity 3+;Past/Current Experience;Time since onset of injury/illness/exacerbation;Fitness    Comorbidities B knee OA, Plantarfasciitis, Pre-diabetes, HTN, neuropathy, Hx substance abuse sober 18 years    Examination-Activity Limitations Locomotion Level;Squat;Stairs    Stability/Clinical Decision Making Stable/Uncomplicated    Clinical Decision Making Low    Rehab Potential Good    PT Frequency 2x / week    PT Duration 8 weeks    PT Treatment/Interventions ADLs/Self Care Home Management;Electrical Stimulation;Cryotherapy;Moist Heat;Gait training;Neuromuscular re-education;Balance training;Therapeutic exercise;Therapeutic activities;Functional mobility training;Stair training;Patient/family education;Manual techniques;Energy conservation;Vasopneumatic Device;Taping;Scar mobilization    PT Next Visit Plan Reassess HEP. Ankle FOTO. Gently progress LE/ankle flexibility and strength as tolerated. manual and modalities for pain and edema  management as needed.    PT Home Exercise Plan see pt edu    Consulted and Agree with Plan of Care Patient           Patient will benefit from skilled therapeutic intervention in order to improve the following deficits and impairments:  Abnormal gait,Decreased range of motion,Difficulty walking,Increased muscle spasms,Pain,Impaired flexibility,Hypomobility,Decreased scar mobility,Decreased balance,Decreased mobility,Decreased strength,Increased edema,Postural dysfunction  Visit Diagnosis: Pain in right ankle and joints of right foot - Plan: PT plan of care cert/re-cert  Muscle weakness (generalized) - Plan: PT plan of care cert/re-cert  Other abnormalities of gait and mobility - Plan: PT plan of care cert/re-cert  Stiffness of right ankle, not elsewhere classified - Plan: PT plan of care cert/re-cert  Pain in left ankle and joints of left foot - Plan: PT plan of care cert/re-cert  Chronic pain of left knee - Plan: PT plan of care cert/re-cert  Chronic pain of right knee - Plan: PT plan of care cert/re-cert  Localized edema - Plan: PT plan of care cert/re-cert     Problem List Patient Active Problem List   Diagnosis Date Noted  . Odontogenic infection of jaw 06/10/2020  . OSA (obstructive sleep apnea) 04/01/2020  . Moderate persistent asthma with acute exacerbation 12/30/2019  . Chronic coughing 12/30/2019  . Seasonal allergies 12/30/2019  . Loud snoring 12/16/2019  . Prediabetes 01/04/2019  . Obesity (BMI 30-39.9) 01/04/2019  . Ganglion cyst 09/28/2018  . Carpal tunnel syndrome of right wrist 09/28/2018  . Chronic midline low back pain without sciatica 11/23/2017  . Essential hypertension 11/23/2017  . Chronic pain of right knee 11/23/2017  . 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 Marijuana abuse 11/23/2017  . Tobacco dependence 11/23/2017  . Alcohol abuse 07/30/2016  . S/P ACL reconstruction 01/31/2014  . DEPRESSION 11/17/2008  . ALLERGIC RHINITIS 11/17/2008   . GERD 11/17/2008    Anson Crofts, PT, DPT 10/14/2020, 12:02 PM  [facility] [address] [place], [place], [address] Phone: [phone]   Fax:  [phone]  Name: [name] [name] MRN: [id number] Date of Birth: [birth year]

## 2020-10-14 NOTE — Telephone Encounter (Signed)
Spoke to patient and I did let her know to call her pcp to get her in for a sooner appointment

## 2020-10-14 NOTE — Patient Instructions (Signed)
Access Code: [email or web address] URL: [email or web address] Date: 10/14/2020 Prepared by: Claude Manges  Exercises Supine Ankle Pumps - 1 x daily - 7 x weekly - 3 sets - 10 reps Seated Quad Set - 1 x daily - 7 x weekly - 10 reps - 10 seconds hold Seated Toe Raise - 1 x daily - 7 x weekly - 2 sets - 10 reps Seated March - 1 x daily - 7 x weekly - 3 sets - 10 reps Gastroc Stretch on Wall - 1 x daily - 7 x weekly - 4 reps - 20-30 second each hold

## 2020-10-15 MED ORDER — CELECOXIB 200 MG PO CAPS
200.0000 mg | ORAL_CAPSULE | Freq: Every day | ORAL | 1 refills | Status: DC
Start: 2020-10-15 — End: 2023-01-16

## 2020-10-15 MED FILL — CELECOXIB 200 MG CAP: 200 | 30 days supply | Qty: 30 | Fill #0

## 2020-10-15 NOTE — Telephone Encounter (Signed)
Patient would like medication sent to the following pharmacy, if possible   [facility] [address] [place], [place] - [address]        Phone:             [phone]   Please contact to advise further if necessary

## 2020-10-15 NOTE — Telephone Encounter (Signed)
Rx sent 

## 2020-10-15 NOTE — Telephone Encounter (Signed)
Pt would like to have new prescription for Celebrex sent to [facility] [address] [place]; will route to office for final disposition.

## 2020-10-15 NOTE — Telephone Encounter (Signed)
Patient would like medication sent to the following pharmacy, if possible   [facility] [place], [place] - [address]        Phone:             [phone]   Please contact to advise further if necessary 

## 2020-10-18 NOTE — Telephone Encounter (Signed)
Called pt made aware °

## 2020-10-20 ENCOUNTER — Ambulatory Visit: Payer: Self-pay | Admitting: Physical Therapy

## 2020-10-21 ENCOUNTER — Other Ambulatory Visit: Payer: Self-pay

## 2020-10-21 ENCOUNTER — Ambulatory Visit: Payer: Self-pay | Admitting: Physician Assistant

## 2020-10-21 VITALS — BP 180/117 | HR 81 | Temp 98.5°F | Resp 18 | Ht 64.0 in | Wt 193.0 lb

## 2020-10-21 DIAGNOSIS — M272 Inflammatory conditions of jaws: Secondary | ICD-10-CM

## 2020-10-21 DIAGNOSIS — K219 Gastro-esophageal reflux disease without esophagitis: Secondary | ICD-10-CM

## 2020-10-21 DIAGNOSIS — I10 Essential (primary) hypertension: Secondary | ICD-10-CM

## 2020-10-21 MED ORDER — KETOROLAC TROMETHAMINE 60 MG/2ML IM SOLN
60.0000 mg | Freq: Once | INTRAMUSCULAR | Status: AC
  Administered 2020-10-21: 60 mg via INTRAMUSCULAR

## 2020-10-21 MED ORDER — PENICILLIN V POTASSIUM 500 MG PO TABS: 500.0000 mg | ORAL_TABLET | Freq: Four times a day (QID) | ORAL | 0 refills | Status: AC

## 2020-10-21 NOTE — Progress Notes (Signed)
Established Patient Office Visit  Subjective:  Patient ID: [name] [name], female    DOB: Nov 02, 1965  Age: 55 y.o. MRN: [id number]  CC: No chief complaint on file.   HPI [name] reports that she has been having severe pain on the right upper side of her mouth.  Reports that she knows that she has a tooth that needs to be pulled, however she feels that she was traumatized last time that she had her teeth pulled and is fearful of returning to the dentist.  Reports that she has been using ibuprofen, Goody's powders, gargling with peroxide and using Orajel without any relief.  Reports that she does check her blood pressure at home, states that it does stay elevated, but not as elevated as it is today, states her readings are generally 140/90.  Denies hypertensive symptoms   Past Medical History:  Diagnosis Date  . Alcohol use disorder, moderate, dependence (HCC)    06-09-2020  per pt not taking naltrexone as prescribed since august 2021, unable to afford it  . Allergic rhinitis, seasonal   . Bipolar 1 disorder (HCC)   . Chronic constipation   . Chronic low back pain   . Chronic pain of right knee   . DDD (degenerative disc disease)   . GERD (gastroesophageal reflux disease)   . History of 2019 novel coronavirus disease (COVID-19) 05/2019   06-09-2020  per pt residual chronic cough and intermittant sob  . History of gastric ulcer   . Hyperlipidemia   . Hypertension   . MDD (major depressive disorder)   . Migraine   . Mild obstructive sleep apnea    sleep study in epic 01-22-2020, recommended pt to have cpap titrate ,  per pt due to pain have not gone back   . Moderate persistent asthma    followed by pcp  . Osteoarthritis   . Post-COVID chronic cough   . Pre-diabetes   . Sciatica   . SOB (shortness of breath)    06-09-2020  per pt intermittant due to residual from covid   . Wears glasses   . Wears glasses     Past Surgical History:  Procedure Laterality Date   . BUNIONECTOMY Left 2011  . HEEL SPUR SURGERY Right 2012  . KNEE ARTHROSCOPY WITH ANTERIOR CRUCIATE LIGAMENT (ACL) REPAIR WITH HAMSTRING GRAFT Right 01/31/2014   Procedure: RIGHT KNEE ARTHROSCOPY WITH ALLOGRAFT (ACL) ANTERIOR CRUCIATE LIGAMENT RECONSTRUCTON PARTIAL MENISCECTOMY VERSES REPAIR ;  Surgeon: Eugenia Mcalpine, MD;  Location: [facility];  Service: Orthopedics;  Laterality: Right;  . PLANTAR FASCIA RELEASE Right 06/15/2020   Procedure: ENDOSCOPIC PLANTAR FASCIOTOMY WITH EXCISION OF PAINFUL SCAR;  Surgeon: Asencion Islam, DPM;  Location: [facility];  Service: Podiatry;  Laterality: Right;  LOCAL  . TOTAL ABDOMINAL HYSTERECTOMY W/ BILATERAL SALPINGOOPHORECTOMY  04-20-2006    Family History  Problem Relation Age of Onset  . Hypertension Father   . Breast cancer Maternal Aunt     Social History   Socioeconomic History  . Marital status: Legally Separated    Spouse name: Not on file  . Number of children: Not on file  . Years of education: Not on file  . Highest education level: Not on file  Occupational History  . Not on file  Tobacco Use  . Smoking status: Current Every Day Smoker    Packs/day: 0.25    Years: 32.00    Pack years: 8.00    Types: Cigarettes  . Smokeless  tobacco: Never Used  . Tobacco comment: per pt down to 6 cig per day  Vaping Use  . Vaping Use: Never used  Substance and Sexual Activity  . Alcohol use: Yes    Comment: alcohol use disorder;  06-09-2020  per pt last drank alcohol 06-06-2020  . Drug use: Not Currently    Types: Marijuana    Comment: occasional  marijuana--  hx  "crack" use  last used 2004--  no treatment rehab (done on her own)  . Sexual activity: Not Currently    Birth control/protection: Surgical  Other Topics Concern  . Not on file  Social History Narrative  . Not on file   Social Determinants of Health   Financial Resource Strain: Not on file  Food Insecurity: Not on file  Transportation Needs:  Not on file  Physical Activity: Not on file  Stress: Not on file  Social Connections: Not on file  Intimate Partner Violence: Not on file    Outpatient Medications Prior to Visit  Medication Sig Dispense Refill  . acetaminophen (TYLENOL) 500 MG tablet Take 1 tablet (500 mg total) by mouth every 6 (six) hours as needed for up to 30 doses. 30 tablet 0  . albuterol (PROVENTIL) (2.5 MG/3ML) 0.083% nebulizer solution Take 3 mLs (2.5 mg total) by nebulization every 6 (six) hours as needed for wheezing or shortness of breath. 150 mL 1  . albuterol (VENTOLIN HFA) 108 (90 Base) MCG/ACT inhaler Inhale 2 puffs into the lungs every 6 (six) hours as needed for wheezing or shortness of breath. (Patient taking differently: Inhale 2 puffs into the lungs every 6 (six) hours as needed for wheezing or shortness of breath.) 18 g 2  . amLODipine (NORVASC) 10 MG tablet Take 1 tablet (10 mg total) by mouth daily. (Patient taking differently: Take 10 mg by mouth daily.) 30 tablet 6  . Ascorbic Acid (VITAMIN C PO) Take 1 tablet by mouth daily.    Marland Kitchen. atorvastatin (LIPITOR) 10 MG tablet Take 1 tablet (10 mg total) by mouth daily. 90 tablet 3  . celecoxib (CELEBREX) 200 MG capsule Take 1 capsule (200 mg total) by mouth daily. Stop Ibuprofen 30 capsule 1  . docusate sodium (COLACE) 100 MG capsule Take 1 capsule (100 mg total) by mouth daily as needed. 30 capsule 2  . DULoxetine (CYMBALTA) 60 MG capsule Take 1 capsule (60 mg total) by mouth daily. 30 capsule 1  . fluticasone (FLONASE) 50 MCG/ACT nasal spray Place 2 sprays into both nostrils daily. (Patient taking differently: Place 2 sprays into both nostrils daily.) 16 g 2  . HYDROcodone-acetaminophen (NORCO) 5-325 MG tablet Take 1 tablet by mouth daily as needed. 10 tablet 0  . ibuprofen (ADVIL) 800 MG tablet Take 1 tablet (800 mg total) by mouth every 6 (six) hours as needed. Take in between doses of pain medication for additional relief or for mild pain 30 tablet 0  .  lisinopril-hydrochlorothiazide (ZESTORETIC) 20-25 MG tablet Take 1 tablet by mouth daily. 90 tablet 3  . Multiple Vitamin (MULTIVITAMIN) tablet Take 1 tablet by mouth daily.    . pantoprazole (PROTONIX) 40 MG tablet Take 1 tablet (40 mg total) by mouth daily. (Patient taking differently: Take 40 mg by mouth daily.) 30 tablet 3  . promethazine (PHENERGAN) 12.5 MG tablet Take 1 tablet (12.5 mg total) by mouth every 6 (six) hours as needed for nausea or vomiting. 30 tablet 0  . Spacer/Aero-Holding Chambers (AEROCHAMBER MV) inhaler Use as instructed 1 each 0  .  SUMAtriptan (IMITREX) 100 MG tablet Take 1 tablet earliest onset of migraine.  May repeat in 2 hours if headache persists or recurs.  Maximum 2 tablets in 24 hours. (Patient taking differently: Take 100 mg by mouth every 2 (two) hours as needed. Take 1 tablet earliest onset of migraine.  May repeat in 2 hours if headache persists or recurs.  Maximum 2 tablets in 24 hours.) 10 tablet 3  . zolpidem (AMBIEN) 5 MG tablet Take 1 tablet (5 mg total) by mouth at bedtime as needed for sleep. 15 tablet 1  . predniSONE (STERAPRED UNI-PAK 21 TAB) 10 MG (21) TBPK tablet Take by mouth.     No facility-administered medications prior to visit.    Allergies  Allergen Reactions  . Diphenhydramine Hcl Itching  . Metoprolol Palpitations    ROS Review of Systems  Constitutional: Negative for chills and fever.  HENT: Positive for dental problem and facial swelling. Negative for sinus pressure and sore throat.   Eyes: Negative.   Respiratory: Negative for shortness of breath.   Cardiovascular: Negative for chest pain.  Gastrointestinal: Negative.   Endocrine: Negative.   Genitourinary: Negative.   Musculoskeletal: Negative.   Skin: Negative.   Allergic/Immunologic: Negative.   Neurological: Negative for headaches.  Hematological: Negative.   Psychiatric/Behavioral: Negative.       Objective:    Physical Exam Vitals and nursing note reviewed.   Constitutional:      Appearance: Normal appearance.  HENT:     Head: Normocephalic and atraumatic.      Right Ear: External ear normal.     Left Ear: External ear normal.     Nose: Nose normal.     Mouth/Throat:     Mouth: Mucous membranes are moist.     Dentition: Abnormal dentition. Dental tenderness and dental caries present.     Pharynx: Oropharynx is clear.   Cardiovascular:     Rate and Rhythm: Normal rate and regular rhythm.     Pulses: Normal pulses.     Heart sounds: Normal heart sounds.  Pulmonary:     Effort: Pulmonary effort is normal.     Breath sounds: Normal breath sounds.  Musculoskeletal:        General: Normal range of motion.     Cervical back: Normal range of motion and neck supple.  Skin:    General: Skin is warm and dry.  Neurological:     General: No focal deficit present.     Mental Status: She is alert and oriented to person, place, and time.  Psychiatric:        Mood and Affect: Mood normal.        Behavior: Behavior normal.        Thought Content: Thought content normal.        Judgment: Judgment normal.     BP (!) 180/117 (BP Location: Left Arm, Patient Position: Sitting, Cuff Size: Large)   Pulse 81   Temp 98.5 F (36.9 C)   Resp 18   Ht 5\' 4"  (1.626 m)   Wt 193 lb (87.5 kg)   SpO2 100%   BMI 33.13 kg/m  Wt Readings from Last 3 Encounters:  10/21/20 193 lb (87.5 kg)  09/07/20 189 lb (85.7 kg)  06/15/20 186 lb 9.6 oz (84.6 kg)     Health Maintenance Due  Topic Date Due  . Hepatitis C Screening  Never done  . FOOT EXAM  Never done  . OPHTHALMOLOGY EXAM  Never done  .  PAP SMEAR-Modifier  Never done  . COLONOSCOPY (Pts 45-77yrs Insurance coverage will need to be confirmed)  Never done  . HEMOGLOBIN A1C  09/30/2020    There are no preventive care reminders to display for this patient.  Lab Results  Component Value Date   TSH 0.502 07/31/2016   Lab Results  Component Value Date   WBC 6.1 12/16/2019   HGB 13.9 06/15/2020    HCT 41.0 06/15/2020   MCV 90 12/16/2019   PLT 363 12/16/2019   Lab Results  Component Value Date   NA 139 06/15/2020   K 3.5 06/15/2020   CO2 25 12/19/2019   GLUCOSE 107 (H) 06/15/2020   BUN 18 06/15/2020   CREATININE 0.90 06/15/2020   BILITOT <0.2 03/30/2020   ALKPHOS 85 03/30/2020   AST 28 03/30/2020   ALT 21 03/30/2020   PROT 7.8 03/30/2020   ALBUMIN 4.3 03/30/2020   CALCIUM 9.4 12/19/2019   ANIONGAP 10 12/19/2019   Lab Results  Component Value Date   CHOL 232 (H) 12/16/2019   Lab Results  Component Value Date   HDL 44 12/16/2019   Lab Results  Component Value Date   LDLCALC 152 (H) 12/16/2019   Lab Results  Component Value Date   TRIG 195 (H) 12/16/2019   Lab Results  Component Value Date   CHOLHDL 5.3 (H) 12/16/2019   Lab Results  Component Value Date   HGBA1C 5.9 (H) 03/30/2020      Assessment & Plan:   Problem List Items Addressed This Visit      Digestive   GERD     Other   Odontogenic infection of jaw - Primary   Relevant Medications   penicillin v potassium (VEETID) 500 MG tablet    Other Visit Diagnoses    Elevated blood pressure reading with diagnosis of hypertension        1. Odontogenic infection of jaw Trial penicillin, patient given the pain injection in clinic.  [facility]  Patient encouraged for prompt evaluation with dentist.  Red flags given for prompt reevaluation - penicillin v potassium (VEETID) 500 MG tablet; Take 1 tablet (500 mg total) by mouth 4 (four) times daily for 7 days.  Dispense: 28 tablet; Refill: 0 - ketorolac (TORADOL) injection 60 mg  2. Elevated blood pressure reading with diagnosis of hypertension Patient encouraged to continue checking blood pressure at home, keep a written log and have available for all office visits.  Patient has follow-up appointment with PCP on March 2022.  Patient encouraged to return to mobile unit if blood  pressure readings remain elevated.  3. Gastroesophageal reflux disease without esophagitis Encouraged patient to avoid ibuprofen and Goody's powders as much as possible due to history of GERD.   I have reviewed the patient's medical history (PMH, PSH, Social History, Family History, Medications, and allergies) , and have been updated if relevant. I spent 30 minutes reviewing chart and  face to face time with patient.     Meds ordered this encounter  Medications  . penicillin v potassium (VEETID) 500 MG tablet    Sig: Take 1 tablet (500 mg total) by mouth 4 (four) times daily for 7 days.    Dispense:  28 tablet    Refill:  0    Order Specific Question:   Supervising Provider    Answer:   Delford Field, PATRICK E [[id number]  . ketorolac (TORADOL) injection 60 mg    Follow-up: Return  if symptoms worsen or fail to improve.    Loraine Grip Mayers, PA-C

## 2020-10-21 NOTE — Patient Instructions (Signed)
For your dental pain, you will take penicillin 4 times a day for the next 7 days.  Make sure you finish this prescription.  I encourage you to make a follow-up appointment with dentistry.  For your difficulty swallowing, I encourage you to chew your food really well, do not drink cold beverages while you eat.  I encourage you to continue checking your blood pressure on a daily basis, keep a written log and have that available for all office visits.  I hope that you feel better soon.  Roney Jaffe, PA-C Physician Assistant [facility] [email or web address]    Dental Pain Dental pain is often a sign that something is wrong with your teeth or gums. It is also something that can occur following dental treatment. If you have dental pain, it is important to contact your dental care provider, especially if the cause of the pain has not been determined. Dental pain may be of varying intensity and can be caused by many things, including:  Tooth decay (cavities or caries). Cavities are caused by bacteria that produce acids that irritate the nerve of your tooth, making it sensitive to air and hot or cold temperatures. This eventually causes discomfort or pain.  Abscess or infection. Once the bacteria reach the inner part of the tooth (pulp), a bacterial infection (dental abscess) can occur. Pus typically collects at the end of the root of a tooth.  Injury.  A crack in the tooth.  Gum recession exposing the root, and possibly the nerves, of a tooth.  Gum (periodontal)disease.  Abnormal grinding or clenching.  Poor or improper home care.  An unknown reason (idiopathic). Your pain may be mild or severe. It may occur when you are:  Chewing.  Exposed to hot or cold temperatures.  Eating or drinking sugary foods or beverages, such as soda or candy. Your pain may be constant, or it may come and go without cause. Follow these instructions  at home: The following actions may help to lessen any discomfort that you are feeling before or after getting dental care. Medicines  Take over-the-counter and prescription medicines only as told by your dental care provider.  If you were prescribed an antibiotic medicine, take it as told by your dental care provider. Do not stop taking the antibiotic even if you start to feel better. Eating and drinking Avoid foods or drinks that cause you pain, such as:  Very hot or very cold foods or drinks.  Sweet or sugary foods or drinks. Managing pain and swelling  Ice can sometimes be used to reduce pain and swelling, especially if the pain is following dental treatment.  If directed, put ice on the painful area of your face. To do this: ? Put ice in a plastic bag. ? Place a towel between your skin and the bag. ? Leave the ice on for 20 minutes, 2-3 times a day. ? Remove the ice if your skin turns bright red. This is very important. If you cannot feel pain, heat, or cold, you have a greater risk of damage to the area.   Brushing your teeth  To keep your mouth and gums healthy, brush your teeth twice a day using a fluoride toothpaste.  Use a toothpaste made for sensitive teeth as directed by your dental care provider, especially if the root is exposed.  Always brush your teeth with a soft-bristled toothbrush. This will help prevent irritation to your gums. General instructions  Floss at least once a day.  Do not apply heat to the outside of the face.  Gargle with a mixture of salt and water 3-4 times a day or as needed. To make salt water, completely dissolve -1 tsp (3-6 g) of salt in 1 cup (237 mL) of warm water.  Keep all follow-up visits. This is important. Contact a dental care provider if:  You have any unexplained dental pain.  Your pain is not controlled with medicines.  Your symptoms get worse.  You have new symptoms. Get help right away if:  You are unable to open  your mouth.  You are having trouble breathing or swallowing.  You have a fever.  You notice that your face, neck, or jaw is swollen. These symptoms may represent a serious problem that is an emergency. Do not wait to see if the symptoms will go away. Get medical help right away. Call your local emergency services (911 in the [place] Do not drive yourself to the hospital. Summary  Dental pain may be caused by many things, including tooth decay and infection.  Your pain may be mild or severe.  Take over-the-counter and prescription medicines only as told by your dental care provider.  Watch your dental pain for any changes. Let your dental care provider know if your symptoms get worse. This information is not intended to replace advice given to you by your health care provider. Make sure you discuss any questions you have with your health care provider. Document Revised: 04/29/2020 Document Reviewed: 04/29/2020 Elsevier Patient Education  2021 [employer].

## 2020-10-21 NOTE — Progress Notes (Signed)
Patient complains of left bottom side of mouth. Patient shares she took a round of antibiotics in the beginning of the year which relieved pain. Patient has been using orajel, iburpfoen 500mg  with minimal relief.

## 2020-10-22 ENCOUNTER — Emergency Department (HOSPITAL_COMMUNITY)
Admission: EM | Admit: 2020-10-22 | Discharge: 2020-10-22 | Disposition: A | Discharge status: Home / Self Care | Payer: Medicaid Other | Attending: Emergency Medicine | Admitting: Emergency Medicine

## 2020-10-22 DIAGNOSIS — Z8616 Personal history of COVID-19: Secondary | ICD-10-CM | POA: Insufficient documentation

## 2020-10-22 DIAGNOSIS — Z79899 Other long term (current) drug therapy: Secondary | ICD-10-CM | POA: Insufficient documentation

## 2020-10-22 DIAGNOSIS — K047 Periapical abscess without sinus: Secondary | ICD-10-CM | POA: Insufficient documentation

## 2020-10-22 DIAGNOSIS — J4530 Mild persistent asthma, uncomplicated: Secondary | ICD-10-CM | POA: Insufficient documentation

## 2020-10-22 DIAGNOSIS — I10 Essential (primary) hypertension: Secondary | ICD-10-CM | POA: Insufficient documentation

## 2020-10-22 DIAGNOSIS — F17210 Nicotine dependence, cigarettes, uncomplicated: Secondary | ICD-10-CM | POA: Insufficient documentation

## 2020-10-22 DIAGNOSIS — Z7952 Long term (current) use of systemic steroids: Secondary | ICD-10-CM | POA: Insufficient documentation

## 2020-10-22 MED ORDER — OXYCODONE HCL 5 MG PO TABS
5.0000 mg | ORAL_TABLET | Freq: Once | ORAL | Status: AC
  Administered 2020-10-22: 5 mg via ORAL
  Filled 2020-10-22: qty 1

## 2020-10-22 MED ORDER — ACETAMINOPHEN 500 MG PO TABS
1000.0000 mg | ORAL_TABLET | Freq: Once | ORAL | Status: AC
  Administered 2020-10-22: 1000 mg via ORAL
  Filled 2020-10-22: qty 2

## 2020-10-22 MED ORDER — BUPIVACAINE-EPINEPHRINE (PF) 0.5% -1:200000 IJ SOLN
1.8000 mL | Freq: Once | INTRAMUSCULAR | Status: AC
  Administered 2020-10-22: 1.8 mL
  Filled 2020-10-22: qty 1.8

## 2020-10-22 MED ORDER — CLINDAMYCIN HCL 150 MG PO CAPS: 450.0000 mg | ORAL_CAPSULE | Freq: Three times a day (TID) | ORAL | 0 refills | Status: AC

## 2020-10-22 MED ORDER — DIAZEPAM 5 MG PO TABS
5.0000 mg | ORAL_TABLET | Freq: Once | ORAL | Status: AC
  Administered 2020-10-22: 5 mg via ORAL
  Filled 2020-10-22: qty 1

## 2020-10-22 MED ORDER — CLINDAMYCIN HCL 300 MG PO CAPS
450.0000 mg | ORAL_CAPSULE | Freq: Once | ORAL | Status: AC
  Administered 2020-10-22: 450 mg via ORAL
  Filled 2020-10-22: qty 1

## 2020-10-22 MED ORDER — KETOROLAC TROMETHAMINE 60 MG/2ML IM SOLN
15.0000 mg | Freq: Once | INTRAMUSCULAR | Status: AC
  Administered 2020-10-22: 15 mg via INTRAMUSCULAR
  Filled 2020-10-22: qty 2

## 2020-10-22 NOTE — ED Provider Notes (Signed)
[facility]-EMERGENCY DEPT Provider Note   CSN: [id number] Arrival date & time: 10/22/20  1201     History Chief Complaint  Patient presents with  . Facial Swelling  . Dental Pain    [name] [name] is a 55 y.o. female.  55 yo F with a chief complaints of left lower dental pain.  This is been an ongoing issue worsening over the past few days.  Saw her family doctor yesterday who started her on penicillin.  Since then she woke up with significant worsening edema.  No difficulty breathing or swallowing.  No neck pain.  She has had an issue like this previously but on the other side.  The history is provided by the patient.  Dental Pain Location:  Lower Lower teeth location:  18/LL 2nd molar Quality:  Aching and pulsating Severity:  Moderate Onset quality:  Gradual Duration:  3 days Timing:  Constant Progression:  Worsening Chronicity:  Recurrent Relieved by:  Nothing Worsened by:  Nothing Ineffective treatments:  None tried Associated symptoms: facial pain and facial swelling   Associated symptoms: no congestion, no fever and no headaches        Past Medical History:  Diagnosis Date  . Alcohol use disorder, moderate, dependence (HCC)    06-09-2020  per pt not taking naltrexone as prescribed since august 2021, unable to afford it  . Allergic rhinitis, seasonal   . Bipolar 1 disorder (HCC)   . Chronic constipation   . Chronic low back pain   . Chronic pain of right knee   . DDD (degenerative disc disease)   . GERD (gastroesophageal reflux disease)   . History of 2019 novel coronavirus disease (COVID-19) 05/2019   06-09-2020  per pt residual chronic cough and intermittant sob  . History of gastric ulcer   . Hyperlipidemia   . Hypertension   . MDD (major depressive disorder)   . Migraine   . Mild obstructive sleep apnea    sleep study in epic 01-22-2020, recommended pt to have cpap titrate ,  per pt due to pain have not gone back   . Moderate  persistent asthma    followed by pcp  . Osteoarthritis   . Post-COVID chronic cough   . Pre-diabetes   . Sciatica   . SOB (shortness of breath)    06-09-2020  per pt intermittant due to residual from covid   . Wears glasses   . Wears glasses     Patient Active Problem List   Diagnosis Date Noted  . Odontogenic infection of jaw 06/10/2020  . OSA (obstructive sleep apnea) 04/01/2020  . Moderate persistent asthma with acute exacerbation 12/30/2019  . Chronic coughing 12/30/2019  . Seasonal allergies 12/30/2019  . Loud snoring 12/16/2019  . Prediabetes 01/04/2019  . Obesity (BMI 30-39.9) 01/04/2019  . Ganglion cyst 09/28/2018  . Carpal tunnel syndrome of right wrist 09/28/2018  . Chronic midline low back pain without sciatica 11/23/2017  . Elevated blood pressure reading with diagnosis of hypertension 11/23/2017  . Chronic pain of right knee 11/23/2017  . 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 Marijuana abuse 11/23/2017  . Tobacco dependence 11/23/2017  . Alcohol abuse 07/30/2016  . S/P ACL reconstruction 01/31/2014  . DEPRESSION 11/17/2008  . ALLERGIC RHINITIS 11/17/2008  . GERD 11/17/2008    Past Surgical History:  Procedure Laterality Date  . BUNIONECTOMY Left 2011  . HEEL SPUR SURGERY Right 2012  . KNEE ARTHROSCOPY WITH ANTERIOR CRUCIATE LIGAMENT (ACL) REPAIR WITH HAMSTRING  GRAFT Right 01/31/2014   Procedure: RIGHT KNEE ARTHROSCOPY WITH ALLOGRAFT (ACL) ANTERIOR CRUCIATE LIGAMENT RECONSTRUCTON PARTIAL MENISCECTOMY VERSES REPAIR ;  Surgeon: Eugenia Mcalpine, MD;  Location: [facility];  Service: Orthopedics;  Laterality: Right;  . PLANTAR FASCIA RELEASE Right 06/15/2020   Procedure: ENDOSCOPIC PLANTAR FASCIOTOMY WITH EXCISION OF PAINFUL SCAR;  Surgeon: Asencion Islam, DPM;  Location: [facility];  Service: Podiatry;  Laterality: Right;  LOCAL  . TOTAL ABDOMINAL HYSTERECTOMY W/ BILATERAL SALPINGOOPHORECTOMY  04-20-2006     OB History   No  obstetric history on file.     Family History  Problem Relation Age of Onset  . Hypertension Father   . Breast cancer Maternal Aunt     Social History   Tobacco Use  . Smoking status: Current Every Day Smoker    Packs/day: 0.25    Years: 32.00    Pack years: 8.00    Types: Cigarettes  . Smokeless tobacco: Never Used  . Tobacco comment: per pt down to 6 cig per day  Vaping Use  . Vaping Use: Never used  Substance Use Topics  . Alcohol use: Yes    Comment: alcohol use disorder;  06-09-2020  per pt last drank alcohol 06-06-2020  . Drug use: Not Currently    Types: Marijuana    Comment: occasional  marijuana--  hx  "crack" use  last used 2004--  no treatment rehab (done on her own)    Home Medications Prior to Admission medications   Medication Sig Start Date End Date Taking? Authorizing Provider  clindamycin (CLEOCIN) 150 MG capsule Take 3 capsules (450 mg total) by mouth 3 (three) times daily for 7 days. 10/22/20 10/29/20 Yes Melene Plan, DO  acetaminophen (TYLENOL) 500 MG tablet Take 1 tablet (500 mg total) by mouth every 6 (six) hours as needed for up to 30 doses. 06/27/19   Curatolo, Adam, DO  albuterol (PROVENTIL) (2.5 MG/3ML) 0.083% nebulizer solution Take 3 mLs (2.5 mg total) by nebulization every 6 (six) hours as needed for wheezing or shortness of breath. 01/20/20   Marcine Matar, MD  albuterol (VENTOLIN HFA) 108 (90 Base) MCG/ACT inhaler Inhale 2 puffs into the lungs every 6 (six) hours as needed for wheezing or shortness of breath. Patient taking differently: Inhale 2 puffs into the lungs every 6 (six) hours as needed for wheezing or shortness of breath. 04/28/20   Marcine Matar, MD  amLODipine (NORVASC) 10 MG tablet Take 1 tablet (10 mg total) by mouth daily. Patient taking differently: Take 10 mg by mouth daily. 01/20/20   Marcine Matar, MD  Ascorbic Acid (VITAMIN C PO) Take 1 tablet by mouth daily.    [provider]  atorvastatin (LIPITOR) 10 MG  tablet Take 1 tablet (10 mg total) by mouth daily. 12/18/19   Marcine Matar, MD  celecoxib (CELEBREX) 200 MG capsule Take 1 capsule (200 mg total) by mouth daily. Stop Ibuprofen 10/15/20   Marcine Matar, MD  docusate sodium (COLACE) 100 MG capsule Take 1 capsule (100 mg total) by mouth daily as needed. 06/15/20 06/15/21  Asencion Islam, DPM  DULoxetine (CYMBALTA) 60 MG capsule Take 1 capsule (60 mg total) by mouth daily. 09/07/20   Mayers, Cari S, PA-C  fluticasone (FLONASE) 50 MCG/ACT nasal spray Place 2 sprays into both nostrils daily. Patient taking differently: Place 2 sprays into both nostrils daily. 12/30/19   Storm Frisk, MD  HYDROcodone-acetaminophen (NORCO) 5-325 MG tablet Take 1 tablet by mouth  daily as needed. 09/24/20   Cristie Hem, PA-C  ibuprofen (ADVIL) 800 MG tablet Take 1 tablet (800 mg total) by mouth every 6 (six) hours as needed. Take in between doses of pain medication for additional relief or for mild pain 10/01/20   Asencion Islam, DPM  lisinopril-hydrochlorothiazide (ZESTORETIC) 20-25 MG tablet Take 1 tablet by mouth daily. 06/10/20   Swords, Valetta Mole, MD  Multiple Vitamin (MULTIVITAMIN) tablet Take 1 tablet by mouth daily.    [provider]  pantoprazole (PROTONIX) 40 MG tablet Take 1 tablet (40 mg total) by mouth daily. Patient taking differently: Take 40 mg by mouth daily. 12/30/19   Storm Frisk, MD  penicillin v potassium (VEETID) 500 MG tablet Take 1 tablet (500 mg total) by mouth 4 (four) times daily for 7 days. 10/21/20 10/28/20  Mayers, Cari S, PA-C  promethazine (PHENERGAN) 12.5 MG tablet Take 1 tablet (12.5 mg total) by mouth every 6 (six) hours as needed for nausea or vomiting. 06/15/20   Asencion Islam, DPM  Spacer/Aero-Holding Chambers (AEROCHAMBER MV) inhaler Use as instructed 12/30/19   Storm Frisk, MD  SUMAtriptan (IMITREX) 100 MG tablet Take 1 tablet earliest onset of migraine.  May repeat in 2 hours if headache persists or  recurs.  Maximum 2 tablets in 24 hours. Patient taking differently: Take 100 mg by mouth every 2 (two) hours as needed. Take 1 tablet earliest onset of migraine.  May repeat in 2 hours if headache persists or recurs.  Maximum 2 tablets in 24 hours. 11/13/19   Drema Dallas, DO  zolpidem (AMBIEN) 5 MG tablet Take 1 tablet (5 mg total) by mouth at bedtime as needed for sleep. 07/30/20   Asencion Islam, DPM    Allergies    Diphenhydramine hcl and Metoprolol  Review of Systems   Review of Systems  Constitutional: Negative for chills and fever.  HENT: Positive for facial swelling. Negative for congestion and rhinorrhea.   Eyes: Negative for redness and visual disturbance.  Respiratory: Negative for shortness of breath and wheezing.   Cardiovascular: Negative for chest pain and palpitations.  Gastrointestinal: Negative for nausea and vomiting.  Genitourinary: Negative for dysuria and urgency.  Musculoskeletal: Negative for arthralgias and myalgias.  Skin: Negative for pallor and wound.  Neurological: Negative for dizziness and headaches.    Physical Exam Updated Vital Signs BP (!) 145/107 (BP Location: Right Arm)   Pulse (!) 108   Temp 98 F (36.7 C) (Oral)   Resp 18   SpO2 96%   Physical Exam Vitals and nursing note reviewed.  Constitutional:      General: She is not in acute distress.    Appearance: She is well-developed. She is not diaphoretic.  HENT:     Head: Normocephalic and atraumatic.     Mouth/Throat:     Pharynx: Posterior oropharyngeal erythema present.     Comments: Erythema and edema to the left lower jaw.  Pain on palpation of the roots of the left second lower molar.  There is a small area of purulence adjacent to the tooth.  No sublingual swelling.  Tolerating secretions without difficulty. Eyes:     Pupils: Pupils are equal, round, and reactive to light.  Cardiovascular:     Rate and Rhythm: Normal rate and regular rhythm.     Heart sounds: No murmur heard. No  friction rub. No gallop.   Pulmonary:     Effort: Pulmonary effort is normal.     Breath sounds: No  wheezing or rales.  Abdominal:     General: There is no distension.     Palpations: Abdomen is soft.     Tenderness: There is no abdominal tenderness.  Musculoskeletal:        General: No tenderness.     Cervical back: Normal range of motion and neck supple.  Skin:    General: Skin is warm and dry.  Neurological:     Mental Status: She is alert and oriented to person, place, and time.  Psychiatric:        Behavior: Behavior normal.     ED Results / Procedures / Treatments   Labs (all labs ordered are listed, but only abnormal results are displayed) Labs Reviewed - No data to display  EKG None  Radiology No results found.  Procedures .Marland Kitchen.Incision and Drainage  Date/Time: 10/22/2020 7:54 PM Performed by: Melene PlanFloyd, Dondrea Clendenin, DO Authorized by: Melene PlanFloyd, Audrina Marten, DO   Consent:    Consent obtained:  Verbal   Consent given by:  Patient   Risks, benefits, and alternatives were discussed: yes     Risks discussed:  Bleeding, incomplete drainage and infection   Alternatives discussed:  No treatment and delayed treatment Universal protocol:    Procedure explained and questions answered to patient or proxy's satisfaction: no     Patient identity confirmed:  Verbally with patient Location:    Type:  Abscess   Size:  Dime   Location:  Mouth   Mouth location:  Floor of mouth Sedation:    Sedation type:  None Anesthesia:    Anesthesia method:  Nerve block   Block needle gauge:  27 G   Block anesthetic:  Bupivacaine 0.25% WITH epi   Block injection procedure:  Anatomic landmarks palpated, introduced needle, negative aspiration for blood and anatomic landmarks identified   Block outcome:  Anesthesia achieved Procedure type:    Complexity:  Simple Procedure details:    Ultrasound guidance: no     Needle aspiration: no     Incision types:  Single straight   Incision depth:  Subcutaneous    Drainage:  Bloody   Drainage amount:  Scant   Wound treatment:  Wound left open   Packing materials:  None Post-procedure details:    Procedure completion:  Tolerated well, no immediate complications Dental Block  Date/Time: 10/22/2020 7:55 PM Performed by: Melene PlanFloyd, Sadiya Durand, DO Authorized by: Melene PlanFloyd, Concetta Guion, DO   Consent:    Consent obtained:  Verbal   Consent given by:  Patient   Risks, benefits, and alternatives were discussed: yes     Risks discussed:  Allergic reaction, infection, intravascular injection, pain and nerve damage   Alternatives discussed:  No treatment, delayed treatment and alternative treatment Universal protocol:    Procedure explained and questions answered to patient or proxy's satisfaction: yes     Patient identity confirmed:  Verbally with patient Indications:    Indications: dental abscess   Location:    Block type:  Inferior alveolar   Laterality:  Left Procedure details:    Topical anesthetic:  Benzocaine gel   Syringe type:  Aspirating dental syringe   Needle gauge:  27 G   Anesthetic injected:  Bupivacaine 0.25% WITH epi   Injection procedure:  Anatomic landmarks identified, anatomic landmarks palpated, introduced needle and negative aspiration for blood Post-procedure details:    Outcome:  Pain relieved   Procedure completion:  Tolerated well, no immediate complications     Medications Ordered in ED Medications  acetaminophen (TYLENOL) tablet 1,000  mg (1,000 mg Oral Given 10/22/20 1835)  ketorolac (TORADOL) injection 15 mg (15 mg Intramuscular Given 10/22/20 1835)  oxyCODONE (Oxy IR/ROXICODONE) immediate release tablet 5 mg (5 mg Oral Given 10/22/20 1835)  diazepam (VALIUM) tablet 5 mg (5 mg Oral Given 10/22/20 1835)  bupivacaine-epinephrine (MARCAINE W/ EPI) 0.5% -1:200000 injection 1.8 mL (1.8 mLs Infiltration Given by Other 10/22/20 1938)  clindamycin (CLEOCIN) capsule 450 mg (450 mg Oral Given 10/22/20 1835)  bupivacaine-epinephrine (MARCAINE W/ EPI)  0.5% -1:200000 injection 1.8 mL (1.8 mLs Infiltration Given by Other 10/22/20 1939)    ED Course  I have reviewed the triage vital signs and the nursing notes.  Pertinent labs & imaging results that were available during my care of the patient were reviewed by me and considered in my medical decision making (see chart for details).    MDM Rules/Calculators/A&P                          55 yo F with a chief complaints of left lower dental pain.  This been going on for a few days.  Saw her family doctor yesterday and was started on penicillin.  Woke up this morning with significant swelling.  Clinically patient has a dental abscess.  She had some purulent drainage with the dental block.  No further drainage was expressed.  We will change her to clindamycin.  Dentistry follow-up.  7:57 PM:  I have discussed the diagnosis/risks/treatment options with the patient and believe the pt to be eligible for discharge home to follow-up with PCP. We also discussed returning to the ED immediately if new or worsening sx occur. We discussed the sx which are most concerning (e.g., sudden worsening pain, fever, inability to tolerate by mouth) that necessitate immediate return. Medications administered to the patient during their visit and any new prescriptions provided to the patient are listed below.  Medications given during this visit Medications  acetaminophen (TYLENOL) tablet 1,000 mg (1,000 mg Oral Given 10/22/20 1835)  ketorolac (TORADOL) injection 15 mg (15 mg Intramuscular Given 10/22/20 1835)  oxyCODONE (Oxy IR/ROXICODONE) immediate release tablet 5 mg (5 mg Oral Given 10/22/20 1835)  diazepam (VALIUM) tablet 5 mg (5 mg Oral Given 10/22/20 1835)  bupivacaine-epinephrine (MARCAINE W/ EPI) 0.5% -1:200000 injection 1.8 mL (1.8 mLs Infiltration Given by Other 10/22/20 1938)  clindamycin (CLEOCIN) capsule 450 mg (450 mg Oral Given 10/22/20 1835)  bupivacaine-epinephrine (MARCAINE W/ EPI) 0.5% -1:200000 injection  1.8 mL (1.8 mLs Infiltration Given by Other 10/22/20 1939)     The patient appears reasonably screen and/or stabilized for discharge and I doubt any other medical condition or other [facility] requiring further screening, evaluation, or treatment in the ED at this time prior to discharge.   Final Clinical Impression(s) / ED Diagnoses Final diagnoses:  Dental abscess    Rx / DC Orders ED Discharge Orders         Ordered    clindamycin (CLEOCIN) 150 MG capsule  3 times daily        10/22/20 1939           Melene Plan, DO 10/22/20 1957

## 2020-10-22 NOTE — Discharge Instructions (Signed)
Take 4 over the counter ibuprofen tablets 3 times a day or 2 over-the-counter naproxen tablets twice a day for pain. Also take tylenol 1000mg (2 extra strength) four times a day.   Follow up with a dentist!!

## 2020-10-22 NOTE — ED Triage Notes (Addendum)
Pt complains of left sided facial swelling and pain. Pt was seen yesterday for dental pain, states she needs tooth pooled. She was started on penicillin. She woke up this morning with swelling and pain going down her left neck.

## 2020-10-23 ENCOUNTER — Telehealth: Payer: Self-pay | Admitting: *Deleted

## 2020-10-23 NOTE — Telephone Encounter (Signed)
Transition Care Management Follow-up Telephone Call  Date of discharge and from where: 10/22/2020 - [facility]  How have you been since you were released from the hospital? "I am okay"  Any questions or concerns? No  Items Reviewed:  Did the pt receive and understand the discharge instructions provided? Yes   Medications obtained and verified? Yes   Other? No   Any new allergies since your discharge? No   Dietary orders reviewed? No  Do you have support at home? Yes   Home Care and Equipment/Supplies: Were home health services ordered? not applicable If so, what is the name of the agency? N/A  Has the agency set up a time to come to the patient's home? not applicable Were any new equipment or medical supplies ordered?  No What is the name of the medical supply agency? N/A Were you able to get the supplies/equipment? not applicable Do you have any questions related to the use of the equipment or supplies? No  Functional Questionnaire: (I = Independent and D = Dependent) ADLs: I  Bathing/Dressing- I  Meal Prep- I  Eating- I  Maintaining continence- I  Transferring/Ambulation- I  Managing Meds- I  Follow up appointments reviewed:   PCP Hospital f/u appt confirmed? Yes  Scheduled to see Dr. Laural Benes on 11/04/2020 @ 1420 for a routine appointment.  Specialist Hospital f/u appt confirmed? No    Are transportation arrangements needed? No   If their condition worsens, is the pt aware to call PCP or go to the Emergency Dept.? Yes  Was the patient provided with contact information for the PCP's office or ED? Yes  Was to pt encouraged to call back with questions or concerns? Yes

## 2020-10-27 ENCOUNTER — Other Ambulatory Visit: Payer: Self-pay

## 2020-10-27 ENCOUNTER — Ambulatory Visit: Payer: Self-pay

## 2020-10-27 DIAGNOSIS — M25572 Pain in left ankle and joints of left foot: Secondary | ICD-10-CM

## 2020-10-27 DIAGNOSIS — R2689 Other abnormalities of gait and mobility: Secondary | ICD-10-CM

## 2020-10-27 DIAGNOSIS — M25571 Pain in right ankle and joints of right foot: Secondary | ICD-10-CM

## 2020-10-27 DIAGNOSIS — M6281 Muscle weakness (generalized): Secondary | ICD-10-CM

## 2020-10-27 DIAGNOSIS — M25561 Pain in right knee: Secondary | ICD-10-CM

## 2020-10-27 DIAGNOSIS — G8929 Other chronic pain: Secondary | ICD-10-CM

## 2020-10-27 DIAGNOSIS — M25671 Stiffness of right ankle, not elsewhere classified: Secondary | ICD-10-CM

## 2020-10-27 DIAGNOSIS — R600 Localized edema: Secondary | ICD-10-CM

## 2020-10-27 NOTE — Therapy (Signed)
[facility] [address] [place], [place], [address] Phone: [phone]   Fax:  [phone]  Physical Therapy Treatment  Patient Details  Name: [name] [name] MRN: [id number] Date of Birth: [birth year] Referring Provider (PT): Marylene Land DPM   Encounter Date: 10/27/2020   PT End of Session - 10/27/20 1007    Visit Number 2    Number of Visits 17    Date for PT Re-Evaluation 12/09/20    Authorization Type CAFA    PT Start Time 1001    PT Stop Time 1040    PT Time Calculation (min) 39 min    Activity Tolerance Patient tolerated treatment well;Patient limited by pain    Behavior During Therapy [facility] for tasks assessed/performed           Past Medical History:  Diagnosis Date  . Alcohol use disorder, moderate, dependence (HCC)    06-09-2020  per pt not taking naltrexone as prescribed since august 2021, unable to afford it  . Allergic rhinitis, seasonal   . Bipolar 1 disorder (HCC)   . Chronic constipation   . Chronic low back pain   . Chronic pain of right knee   . DDD (degenerative disc disease)   . GERD (gastroesophageal reflux disease)   . History of 2019 novel coronavirus disease (COVID-19) 05/2019   06-09-2020  per pt residual chronic cough and intermittant sob  . History of gastric ulcer   . Hyperlipidemia   . Hypertension   . MDD (major depressive disorder)   . Migraine   . Mild obstructive sleep apnea    sleep study in epic 01-22-2020, recommended pt to have cpap titrate ,  per pt due to pain have not gone back   . Moderate persistent asthma    followed by pcp  . Osteoarthritis   . Post-COVID chronic cough   . Pre-diabetes   . Sciatica   . SOB (shortness of breath)    06-09-2020  per pt intermittant due to residual from covid   . Wears glasses   . Wears glasses     Past Surgical History:  Procedure Laterality Date  . BUNIONECTOMY Left 2011  . HEEL SPUR SURGERY Right 2012  . KNEE ARTHROSCOPY WITH  ANTERIOR CRUCIATE LIGAMENT (ACL) REPAIR WITH HAMSTRING GRAFT Right 01/31/2014   Procedure: RIGHT KNEE ARTHROSCOPY WITH ALLOGRAFT (ACL) ANTERIOR CRUCIATE LIGAMENT RECONSTRUCTON PARTIAL MENISCECTOMY VERSES REPAIR ;  Surgeon: Eugenia Mcalpine, MD;  Location: [facility];  Service: Orthopedics;  Laterality: Right;  . PLANTAR FASCIA RELEASE Right 06/15/2020   Procedure: ENDOSCOPIC PLANTAR FASCIOTOMY WITH EXCISION OF PAINFUL SCAR;  Surgeon: Asencion Islam, DPM;  Location: [facility];  Service: Podiatry;  Laterality: Right;  LOCAL  . TOTAL ABDOMINAL HYSTERECTOMY W/ BILATERAL SALPINGOOPHORECTOMY  04-20-2006    There were no vitals filed for this visit.   Subjective Assessment - 10/27/20 1005    Subjective Had a very rough week, went to ED this past friday due to jaw/tooth pain with an abscess and will be following up with PCP. Has only been doing akle pumps when laying in bed    Pertinent History HTN, Surgery on R knee x2 for OA a few yrs ago. OA B knees and back. History of substance abuse sober 18 years, neuropathy    Patient Stated Goals To have less pain, be able to do steps, walk without as much pain    Currently in Pain? Yes    Pain Score 6  Pain Location Foot    Pain Descriptors / Indicators Squeezing;Tightness;Cramping    Pain Type Acute pain    Pain Onset More than a month ago              [facility] Adult PT Treatment/Exercise - 10/27/20 0001      Exercises   Exercises Knee/Hip;Ankle;Lumbar      Knee/Hip Exercises: Stretches   Other Knee/Hip Stretches Standing gastro/sol stretch at wall 20" x 2 B      Knee/Hip Exercises: Aerobic   Nustep L1 x 6 minutes      Knee/Hip Exercises: Seated   Other Seated Knee/Hip Exercises Quad sets 5", 5 x 2    Marching Strengthening;Both;3 sets;10 reps      Manual Therapy   Manual Therapy Joint mobilization;Soft tissue mobilization;Myofascial release;Passive ROM    Manual therapy comments RIGHT ankle: gentle  distraction with DF stretch in subtalar neutral, gentle distraction with M/L subtalar glides, gentle MT mobs DF/PF. STM achilles tendon      Ankle Exercises: Seated   Heel Raises 10 reps    Toe Raise 20 reps             PT Short Term Goals - 10/14/20 1151      PT SHORT TERM GOAL #1   Title Pt will be independent with initial HEP    Time 2    Period Weeks    Status New    Target Date 10/28/20             PT Long Term Goals - 10/14/20 1151      PT LONG TERM GOAL #1   Title Pt will be independent with advanced HEP    Time 8    Period Weeks    Status New    Target Date 12/09/20      PT LONG TERM GOAL #2   Title Pt will demo R ankle ROM symmetrical to left with </= 1/10 pain    Time 8    Period Weeks    Status New    Target Date 12/09/20      PT LONG TERM GOAL #3   Title Pt will be able to negotiate 12 steps with safe alternitng pattern and  report </= 2/10 pain at knees or R ankle.    Time 8    Period Weeks    Status New    Target Date 12/09/20      PT LONG TERM GOAL #4   Title BLE strength grossly >/= 4+/5    Time 8    Period Weeks    Status New    Target Date 12/09/20                 Plan - 10/27/20 1008    Clinical Impression Statement Pt  tolerated todays session fairly. She had no reports of incr ankle  pain with exercises or with home program exercises. Had some right knee pain in standing and was shown modified seated calf stretch with sheet. She has alot of tightness in the right achilles, tolerated manual therapy end of session nicely.    Personal Factors and Comorbidities Comorbidity 3+;Past/Current Experience;Time since onset of injury/illness/exacerbation;Fitness    Comorbidities B knee OA, Plantarfasciitis, Pre-diabetes, HTN, neuropathy, Hx substance abuse sober 18 years    Examination-Activity Limitations Locomotion Level;Squat;Stairs    Rehab Potential Good    PT Frequency 2x / week    PT Duration 8 weeks    PT Treatment/Interventions  ADLs/Self Care  Home Management;Electrical Stimulation;Cryotherapy;Moist Heat;Gait training;Neuromuscular re-education;Balance training;Therapeutic exercise;Therapeutic activities;Functional mobility training;Stair training;Patient/family education;Manual techniques;Energy conservation;Vasopneumatic Device;Taping;Scar mobilization    PT Next Visit Plan Gently progress LE/ankle flexibility (DF, STN)  and strength as tolerated. manual and modalities for pain and edema management as needed.    Consulted and Agree with Plan of Care Patient           Patient will benefit from skilled therapeutic intervention in order to improve the following deficits and impairments:  Abnormal gait,Decreased range of motion,Difficulty walking,Increased muscle spasms,Pain,Impaired flexibility,Hypomobility,Decreased scar mobility,Decreased balance,Decreased mobility,Decreased strength,Increased edema,Postural dysfunction  Visit Diagnosis: Pain in right ankle and joints of right foot  Muscle weakness (generalized)  Other abnormalities of gait and mobility  Stiffness of right ankle, not elsewhere classified  Chronic pain of left knee  Pain in left ankle and joints of left foot  Chronic pain of right knee  Localized edema     Problem List Patient Active Problem List   Diagnosis Date Noted  . Odontogenic infection of jaw 06/10/2020  . OSA (obstructive sleep apnea) 04/01/2020  . Moderate persistent asthma with acute exacerbation 12/30/2019  . Chronic coughing 12/30/2019  . Seasonal allergies 12/30/2019  . Loud snoring 12/16/2019  . Prediabetes 01/04/2019  . Obesity (BMI 30-39.9) 01/04/2019  . Ganglion cyst 09/28/2018  . Carpal tunnel syndrome of right wrist 09/28/2018  . Chronic midline low back pain without sciatica 11/23/2017  . Elevated blood pressure reading with diagnosis of hypertension 11/23/2017  . Chronic pain of right knee 11/23/2017  . 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  Marijuana abuse 11/23/2017  . Tobacco dependence 11/23/2017  . Alcohol abuse 07/30/2016  . S/P ACL reconstruction 01/31/2014  . DEPRESSION 11/17/2008  . ALLERGIC RHINITIS 11/17/2008  . GERD 11/17/2008    Anson Crofts, PT, DPT 10/27/2020, 10:49 AM  [facility] [address] [place], [place], [address] Phone: [phone]   Fax:  [phone]  Name: WENDY HOBACK MRN: [id number] Date of Birth: 16-Mar-1966

## 2020-10-29 ENCOUNTER — Ambulatory Visit: Payer: Self-pay

## 2020-10-29 ENCOUNTER — Other Ambulatory Visit: Payer: Self-pay

## 2020-10-29 DIAGNOSIS — M25571 Pain in right ankle and joints of right foot: Secondary | ICD-10-CM

## 2020-10-29 DIAGNOSIS — G8929 Other chronic pain: Secondary | ICD-10-CM

## 2020-10-29 DIAGNOSIS — M6281 Muscle weakness (generalized): Secondary | ICD-10-CM

## 2020-10-29 DIAGNOSIS — M25572 Pain in left ankle and joints of left foot: Secondary | ICD-10-CM

## 2020-10-29 DIAGNOSIS — R600 Localized edema: Secondary | ICD-10-CM

## 2020-10-29 DIAGNOSIS — R2689 Other abnormalities of gait and mobility: Secondary | ICD-10-CM

## 2020-10-29 DIAGNOSIS — M25562 Pain in left knee: Secondary | ICD-10-CM

## 2020-10-29 DIAGNOSIS — M25671 Stiffness of right ankle, not elsewhere classified: Secondary | ICD-10-CM

## 2020-10-29 NOTE — Therapy (Signed)
[facility] [address] [place], [place], [address] Phone: [phone]   Fax:  [phone]  Physical Therapy Treatment  Patient Details  Name: [name] [name] MRN: [id number] Date of Birth: 03/28/1966 Referring Provider (PT): Marylene Land DPM   Encounter Date: 10/29/2020   PT End of Session - 10/29/20 1113    Visit Number 3    Number of Visits 17    Date for PT Re-Evaluation 12/09/20    Authorization Type CAFA    PT Start Time 1100    PT Stop Time 1143    PT Time Calculation (min) 43 min    Activity Tolerance Patient tolerated treatment well;Patient limited by pain    Behavior During Therapy [facility] for tasks assessed/performed           Past Medical History:  Diagnosis Date  . Alcohol use disorder, moderate, dependence (HCC)    06-09-2020  per pt not taking naltrexone as prescribed since august 2021, unable to afford it  . Allergic rhinitis, seasonal   . Bipolar 1 disorder (HCC)   . Chronic constipation   . Chronic low back pain   . Chronic pain of right knee   . DDD (degenerative disc disease)   . GERD (gastroesophageal reflux disease)   . History of 2019 novel coronavirus disease (COVID-19) 05/2019   06-09-2020  per pt residual chronic cough and intermittant sob  . History of gastric ulcer   . Hyperlipidemia   . Hypertension   . MDD (major depressive disorder)   . Migraine   . Mild obstructive sleep apnea    sleep study in epic 01-22-2020, recommended pt to have cpap titrate ,  per pt due to pain have not gone back   . Moderate persistent asthma    followed by pcp  . Osteoarthritis   . Post-COVID chronic cough   . Pre-diabetes   . Sciatica   . SOB (shortness of breath)    06-09-2020  per pt intermittant due to residual from covid   . Wears glasses   . Wears glasses     Past Surgical History:  Procedure Laterality Date  . BUNIONECTOMY Left 2011  . HEEL SPUR SURGERY Right 2012  . KNEE ARTHROSCOPY WITH  ANTERIOR CRUCIATE LIGAMENT (ACL) REPAIR WITH HAMSTRING GRAFT Right 01/31/2014   Procedure: RIGHT KNEE ARTHROSCOPY WITH ALLOGRAFT (ACL) ANTERIOR CRUCIATE LIGAMENT RECONSTRUCTON PARTIAL MENISCECTOMY VERSES REPAIR ;  Surgeon: Eugenia Mcalpine, MD;  Location: [facility];  Service: Orthopedics;  Laterality: Right;  . PLANTAR FASCIA RELEASE Right 06/15/2020   Procedure: ENDOSCOPIC PLANTAR FASCIOTOMY WITH EXCISION OF PAINFUL SCAR;  Surgeon: Asencion Islam, DPM;  Location: [facility];  Service: Podiatry;  Laterality: Right;  LOCAL  . TOTAL ABDOMINAL HYSTERECTOMY W/ BILATERAL SALPINGOOPHORECTOMY  04-20-2006    There were no vitals filed for this visit.   Subjective Assessment - 10/29/20 1104    Subjective Had 3 almost falls with feeling of left knee is going to give out on her especially with stairs.  Has a follow up appointment with PCP next week.    Pertinent History HTN, Surgery on R knee x2 for OA a few yrs ago. OA B knees and back. History of substance abuse sober 18 years, neuropathy    Patient Stated Goals To have less pain, be able to do steps, walk without as much pain    Currently in Pain? Yes    Pain Score 6  [facility] Adult PT Treatment/Exercise - 10/29/20 0001      Exercises   Exercises Knee/Hip;Ankle;Lumbar      Lumbar Exercises: Seated   Long Arc Quad on Chair AROM;Both;10 reps   left knee - with pain/popping   Other Seated Lumbar Exercises 3" x 10 ball squeezes.               Knee/Hip Exercises: Seated   Other Seated Knee/Hip Exercises Quad sets 5", 5 x 2    Marching Strengthening;Both;3 sets;10 reps      Manual Therapy   Manual Therapy Joint mobilization;Soft tissue mobilization;Myofascial release;Passive ROM    Manual therapy comments RIGHT ankle: gentle distraction with DF stretch in subtalar neutral, gentle distraction with M/L subtalar glides, gentle MT mobs DF/PF. STM achilles tendon      Ankle Exercises: Seated    Heel Raises 20 reps    Toe Raise 20 reps    Other Seated Ankle Exercises EV/INV on dynadisc 10 x 3             PT Short Term Goals - 10/14/20 1151      PT SHORT TERM GOAL #1   Title Pt will be independent with initial HEP    Time 2    Period Weeks    Status New    Target Date 10/28/20             PT Long Term Goals - 10/14/20 1151      PT LONG TERM GOAL #1   Title Pt will be independent with advanced HEP    Time 8    Period Weeks    Status New    Target Date 12/09/20      PT LONG TERM GOAL #2   Title Pt will demo R ankle ROM symmetrical to left with </= 1/10 pain    Time 8    Period Weeks    Status New    Target Date 12/09/20      PT LONG TERM GOAL #3   Title Pt will be able to negotiate 12 steps with safe alternitng pattern and  report </= 2/10 pain at knees or R ankle.    Time 8    Period Weeks    Status New    Target Date 12/09/20      PT LONG TERM GOAL #4   Title BLE strength grossly >/= 4+/5    Time 8    Period Weeks    Status New    Target Date 12/09/20                 Plan - 10/29/20 1113    Clinical Impression Statement Pt more limited by R knee pain today, at medial joint line. She also had some distal knee swelling medial > lateral, and reports of feeling knee give out over the past few days. Palpable crepitus with reports of feeling popping in the knee with extension. Increased medial knee joint line pain with ER/IR of tibia.  Focus of session on seated and supine exercises for the right ankle and knee within tolerance. Continued tightness in R achilles but tolerates manual therapy well.    Personal Factors and Comorbidities Comorbidity 3+;Past/Current Experience;Time since onset of injury/illness/exacerbation;Fitness    Comorbidities B knee OA, Plantarfasciitis, Pre-diabetes, HTN, neuropathy, Hx substance abuse sober 18 years    Examination-Activity Limitations Locomotion Level;Squat;Stairs    Rehab Potential Good    PT Frequency 2x /  week    PT Duration 8  weeks    PT Treatment/Interventions ADLs/Self Care Home Management;Electrical Stimulation;Cryotherapy;Moist Heat;Gait training;Neuromuscular re-education;Balance training;Therapeutic exercise;Therapeutic activities;Functional mobility training;Stair training;Patient/family education;Manual techniques;Energy conservation;Vasopneumatic Device;Taping;Scar mobilization    PT Next Visit Plan Follow up regarding R knee and ankle pain. Discussed with pt folow up with orthopedic regarding worsening knee pain and instability. Gently progress LE/ankle flexibility (DF, STN)  and strength as tolerated. manual and modalities for pain and edema management as needed.    PT Home Exercise Plan Seated hip adduction with ball or pillow, seated calf stretch with strap or sheet.    Consulted and Agree with Plan of Care Patient           Patient will benefit from skilled therapeutic intervention in order to improve the following deficits and impairments:  Abnormal gait,Decreased range of motion,Difficulty walking,Increased muscle spasms,Pain,Impaired flexibility,Hypomobility,Decreased scar mobility,Decreased balance,Decreased mobility,Decreased strength,Increased edema,Postural dysfunction  Visit Diagnosis: Pain in right ankle and joints of right foot  Muscle weakness (generalized)  Other abnormalities of gait and mobility  Stiffness of right ankle, not elsewhere classified  Chronic pain of left knee  Pain in left ankle and joints of left foot  Chronic pain of right knee  Localized edema     Problem List Patient Active Problem List   Diagnosis Date Noted  . Odontogenic infection of jaw 06/10/2020  . OSA (obstructive sleep apnea) 04/01/2020  . Moderate persistent asthma with acute exacerbation 12/30/2019  . Chronic coughing 12/30/2019  . Seasonal allergies 12/30/2019  . Loud snoring 12/16/2019  . Prediabetes 01/04/2019  . Obesity (BMI 30-39.9) 01/04/2019  . Ganglion cyst  09/28/2018  . Carpal tunnel syndrome of right wrist 09/28/2018  . Chronic midline low back pain without sciatica 11/23/2017  . Elevated blood pressure reading with diagnosis of hypertension 11/23/2017  . Chronic pain of right knee 11/23/2017  . 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 Marijuana abuse 11/23/2017  . Tobacco dependence 11/23/2017  . Alcohol abuse 07/30/2016  . S/P ACL reconstruction 01/31/2014  . DEPRESSION 11/17/2008  . ALLERGIC RHINITIS 11/17/2008  . GERD 11/17/2008    Anson Crofts, PT, DPT 10/29/2020, 11:46 AM  [facility] [address] [place], [place], [address] Phone: [phone]   Fax:  [phone]  Name: [name] [name] MRN: [id number] Date of Birth: 1965/10/11

## 2020-10-30 ENCOUNTER — Telehealth: Payer: Self-pay

## 2020-10-30 NOTE — Telephone Encounter (Signed)
Received Monovisc from [employer] patient assistance Bilateral knee  Appt. 11/13/2020 with Dr. Roda Shutters

## 2020-11-03 ENCOUNTER — Ambulatory Visit: Payer: Self-pay | Admitting: Physical Therapy

## 2020-11-04 ENCOUNTER — Telehealth: Payer: Self-pay

## 2020-11-04 ENCOUNTER — Telehealth: Payer: Medicaid Other | Admitting: Nurse Practitioner

## 2020-11-04 ENCOUNTER — Other Ambulatory Visit: Payer: Self-pay | Admitting: Physician Assistant

## 2020-11-04 MED ORDER — TRAMADOL HCL 50 MG PO TABS
50.0000 mg | ORAL_TABLET | Freq: Three times a day (TID) | ORAL | 0 refills | Status: DC | PRN
Start: 2020-11-04 — End: 2023-01-16

## 2020-11-04 NOTE — Telephone Encounter (Signed)
Pt called and would like to have some pain medication called in for her knee until her injection on the 8th.

## 2020-11-04 NOTE — Telephone Encounter (Signed)
Sent in tramadol

## 2020-11-04 NOTE — Telephone Encounter (Signed)
Patient aware.

## 2020-11-05 ENCOUNTER — Ambulatory Visit: Payer: Self-pay | Attending: Internal Medicine | Admitting: Internal Medicine

## 2020-11-05 ENCOUNTER — Other Ambulatory Visit: Payer: Self-pay

## 2020-11-05 DIAGNOSIS — R1319 Other dysphagia: Secondary | ICD-10-CM

## 2020-11-05 DIAGNOSIS — G4733 Obstructive sleep apnea (adult) (pediatric): Secondary | ICD-10-CM

## 2020-11-05 DIAGNOSIS — I10 Essential (primary) hypertension: Secondary | ICD-10-CM

## 2020-11-05 MED ORDER — LISINOPRIL-HYDROCHLOROTHIAZIDE 20-25 MG PO TABS: 1.0000 | ORAL_TABLET | Freq: Every day | ORAL | 3 refills | Status: DC

## 2020-11-05 MED ORDER — AMLODIPINE BESYLATE 10 MG PO TABS
10.0000 mg | ORAL_TABLET | Freq: Every day | ORAL | 3 refills | Status: AC
Start: 2020-11-05 — End: ?

## 2020-11-05 MED ORDER — PANTOPRAZOLE SODIUM 40 MG PO TBEC
40.0000 mg | DELAYED_RELEASE_TABLET | Freq: Every day | ORAL | 3 refills | Status: DC
Start: 2020-11-05 — End: 2024-04-29

## 2020-11-05 NOTE — Progress Notes (Signed)
Pt states when she eats she feel like her food doesn't digest   Pt states she is needing to do the last sleep study

## 2020-11-05 NOTE — Progress Notes (Addendum)
Virtual Visit via Telephone Note  I connected with [name] [name] on 11/05/20 at  12:14 p.m by telephone and verified that I am speaking with the correct person using two identifiers.  Location: Patient:  home Provider: office  Participants: Myself Patient CMA: Ms. Julius Bowels   I discussed the limitations, risks, security and privacy concerns of performing an evaluation and management service by telephone and the availability of in person appointments. I also discussed with the patient that there may be a patient responsible charge related to this service. The patient expressed understanding and agreed to proceed.   History of Present Illness: Patient with history of HTN, tobdep, bipolar disorder, HL, chronic LBPwhich she attributes to arthritis, ETOHabusein remission, ACD, marijuanain remission, GERD, moderate persistent asthma, COVID infection 05/2019  Pt  C/o solid food getting stuck in center of chest when she swallows. Drinks liquids to try help the food past but she ends up coughing.  She has not had any vomiting.  However the episodes of painful.  She reports about 4 pound weight loss since this started 6 weeks ago.  Out Pantoprazole x 1 wk  OSA: sleep study 01/2020 revealed mild sleep apnea.  She was supposed to have a sleep titration study but ended up having surgery on her foot and had delayed getting it done.  She would like to be referred to have the titration study.  HTN:  Checking BP daily Last reading 170/101 yesterday Out of Norvasc for a while and needs refill.  Reports compliance with lisinopril/hydrochlorothiazide. No chest pains or shortness of breath.  She does limit salt in the foods. Outpatient Encounter Medications as of 11/05/2020  Medication Sig  . traMADol (ULTRAM) 50 MG tablet Take 1 tablet (50 mg total) by mouth 3 (three) times daily as needed.  Marland Kitchen acetaminophen (TYLENOL) 500 MG tablet Take 1 tablet (500 mg total) by mouth every 6 (six) hours as needed for  up to 30 doses.  Marland Kitchen albuterol (PROVENTIL) (2.5 MG/3ML) 0.083% nebulizer solution Take 3 mLs (2.5 mg total) by nebulization every 6 (six) hours as needed for wheezing or shortness of breath.  Marland Kitchen albuterol (VENTOLIN HFA) 108 (90 Base) MCG/ACT inhaler Inhale 2 puffs into the lungs every 6 (six) hours as needed for wheezing or shortness of breath. (Patient taking differently: Inhale 2 puffs into the lungs every 6 (six) hours as needed for wheezing or shortness of breath.)  . amLODipine (NORVASC) 10 MG tablet Take 1 tablet (10 mg total) by mouth daily. (Patient taking differently: Take 10 mg by mouth daily.)  . Ascorbic Acid (VITAMIN C PO) Take 1 tablet by mouth daily.  Marland Kitchen atorvastatin (LIPITOR) 10 MG tablet Take 1 tablet (10 mg total) by mouth daily.  . celecoxib (CELEBREX) 200 MG capsule Take 1 capsule (200 mg total) by mouth daily. Stop Ibuprofen  . docusate sodium (COLACE) 100 MG capsule Take 1 capsule (100 mg total) by mouth daily as needed.  . DULoxetine (CYMBALTA) 60 MG capsule Take 1 capsule (60 mg total) by mouth daily.  . fluticasone (FLONASE) 50 MCG/ACT nasal spray Place 2 sprays into both nostrils daily. (Patient taking differently: Place 2 sprays into both nostrils daily.)  . HYDROcodone-acetaminophen (NORCO) 5-325 MG tablet Take 1 tablet by mouth daily as needed. (Patient not taking: Reported on 11/05/2020)  . ibuprofen (ADVIL) 800 MG tablet Take 1 tablet (800 mg total) by mouth every 6 (six) hours as needed. Take in between doses of pain medication for additional relief or for mild pain  .  lisinopril-hydrochlorothiazide (ZESTORETIC) 20-25 MG tablet Take 1 tablet by mouth daily.  . Multiple Vitamin (MULTIVITAMIN) tablet Take 1 tablet by mouth daily.  . pantoprazole (PROTONIX) 40 MG tablet Take 1 tablet (40 mg total) by mouth daily. (Patient taking differently: Take 40 mg by mouth daily.)  . promethazine (PHENERGAN) 12.5 MG tablet Take 1 tablet (12.5 mg total) by mouth every 6 (six) hours as  needed for nausea or vomiting.  Marland Kitchen Spacer/Aero-Holding Chambers (AEROCHAMBER MV) inhaler Use as instructed  . SUMAtriptan (IMITREX) 100 MG tablet Take 1 tablet earliest onset of migraine.  May repeat in 2 hours if headache persists or recurs.  Maximum 2 tablets in 24 hours. (Patient taking differently: Take 100 mg by mouth every 2 (two) hours as needed. Take 1 tablet earliest onset of migraine.  May repeat in 2 hours if headache persists or recurs.  Maximum 2 tablets in 24 hours.)  . zolpidem (AMBIEN) 5 MG tablet Take 1 tablet (5 mg total) by mouth at bedtime as needed for sleep.   No facility-administered encounter medications on file as of 11/05/2020.      Observations/Objective: No direct observation done as this was a telephone encounter.  Assessment and Plan: 1. Essential hypertension Not at goal.  Out of Norvasc for a while.  RFs given.  Recommend med compliance and continue to limit salt in the foods - lisinopril-hydrochlorothiazide (ZESTORETIC) 20-25 MG tablet; Take 1 tablet by mouth daily.  Dispense: 90 tablet; Refill: 3 - amLODipine (NORVASC) 10 MG tablet; Take 1 tablet (10 mg total) by mouth daily.  Dispense: 90 tablet; Refill: 3  2. Esophageal dysphagia Needs referral to gastroenterology for endoscopy to rule out abnormal esophageal lesions - Ambulatory referral to Gastroenterology  3. OSA (obstructive sleep apnea) - Cpap titration; Future   Follow Up Instructions: 4 mths   I discussed the assessment and treatment plan with the patient. The patient was provided an opportunity to ask questions and all were answered. The patient agreed with the plan and demonstrated an understanding of the instructions.   The patient was advised to call back or seek an in-person evaluation if the symptoms worsen or if the condition fails to improve as anticipated.  I  Spent 9 minutes on this telephone encounter  Jonah Blue, MD

## 2020-11-06 ENCOUNTER — Ambulatory Visit: Payer: Self-pay

## 2020-11-10 ENCOUNTER — Other Ambulatory Visit: Payer: Self-pay

## 2020-11-10 ENCOUNTER — Ambulatory Visit: Payer: Self-pay | Attending: Sports Medicine | Admitting: Physical Therapy

## 2020-11-10 DIAGNOSIS — M6281 Muscle weakness (generalized): Secondary | ICD-10-CM | POA: Insufficient documentation

## 2020-11-10 DIAGNOSIS — G8929 Other chronic pain: Secondary | ICD-10-CM | POA: Insufficient documentation

## 2020-11-10 DIAGNOSIS — M25571 Pain in right ankle and joints of right foot: Secondary | ICD-10-CM | POA: Insufficient documentation

## 2020-11-10 DIAGNOSIS — M25561 Pain in right knee: Secondary | ICD-10-CM | POA: Insufficient documentation

## 2020-11-10 DIAGNOSIS — M25572 Pain in left ankle and joints of left foot: Secondary | ICD-10-CM | POA: Insufficient documentation

## 2020-11-10 DIAGNOSIS — M25671 Stiffness of right ankle, not elsewhere classified: Secondary | ICD-10-CM | POA: Insufficient documentation

## 2020-11-10 DIAGNOSIS — M25562 Pain in left knee: Secondary | ICD-10-CM | POA: Insufficient documentation

## 2020-11-10 DIAGNOSIS — R2689 Other abnormalities of gait and mobility: Secondary | ICD-10-CM | POA: Insufficient documentation

## 2020-11-10 DIAGNOSIS — R600 Localized edema: Secondary | ICD-10-CM | POA: Insufficient documentation

## 2020-11-10 NOTE — Therapy (Signed)
[facility]. [place], [place], [address] Phone: [phone]   Fax:  [phone]  Physical Therapy Treatment  Patient Details  Name: [name] [name] MRN: [id number] Date of Birth: July 20, 1966 Referring Provider (PT): Cannon Kettle DPM   Encounter Date: 11/10/2020   PT End of Session - 11/10/20 1128    Visit Number 4    Number of Visits 17    Date for PT Re-Evaluation 12/09/20    Authorization Type CAFA    PT Start Time 1050    PT Stop Time 8937    PT Time Calculation (min) 38 min           Past Medical History:  Diagnosis Date  . Alcohol use disorder, moderate, dependence ([place])    06-09-2020  per pt not taking naltrexone as prescribed since august 2021, unable to afford it  . Allergic rhinitis, seasonal   . Bipolar 1 disorder ([facility])   . Chronic constipation   . Chronic low back pain   . Chronic pain of right knee   . DDD (degenerative disc disease)   . GERD (gastroesophageal reflux disease)   . History of 2019 novel coronavirus disease (COVID-19) 05/2019   06-09-2020  per pt residual chronic cough and intermittant sob  . History of gastric ulcer   . Hyperlipidemia   . Hypertension   . MDD (major depressive disorder)   . Migraine   . Mild obstructive sleep apnea    sleep study in epic 01-22-2020, recommended pt to have cpap titrate ,  per pt due to pain have not gone back   . Moderate persistent asthma    followed by pcp  . Osteoarthritis   . Post-COVID chronic cough   . Pre-diabetes   . Sciatica   . SOB (shortness of breath)    06-09-2020  per pt intermittant due to residual from covid   . Wears glasses   . Wears glasses     Past Surgical History:  Procedure Laterality Date  . BUNIONECTOMY Left 2011  . HEEL SPUR SURGERY Right 2012  . KNEE ARTHROSCOPY WITH ANTERIOR CRUCIATE LIGAMENT (ACL) REPAIR WITH HAMSTRING GRAFT Right 01/31/2014   Procedure: RIGHT KNEE ARTHROSCOPY WITH ALLOGRAFT (ACL) ANTERIOR  CRUCIATE LIGAMENT RECONSTRUCTON PARTIAL MENISCECTOMY VERSES REPAIR ;  Surgeon: Sydnee Cabal, MD;  Location: [facility];  Service: Orthopedics;  Laterality: Right;  . PLANTAR FASCIA RELEASE Right 06/15/2020   Procedure: ENDOSCOPIC PLANTAR FASCIOTOMY WITH EXCISION OF PAINFUL SCAR;  Surgeon: Landis Martins, DPM;  Location: [facility];  Service: Podiatry;  Laterality: Right;  LOCAL  . TOTAL ABDOMINAL HYSTERECTOMY W/ BILATERAL SALPINGOOPHORECTOMY  04-20-2006    There were no vitals filed for this visit.   Subjective Assessment - 11/10/20 1055    Subjective increased pain esp last 2 days- I have not slept. getting knee injections Friday. antalgic gait with noteable RT knee swelling    Currently in Pain? Yes    Pain Score 9     Pain Location Knee    Pain Orientation Right;Left    Multiple Pain Sites Yes    Pain Score 5    Pain Location Foot    Pain Orientation Right              [facility] PT Assessment - 11/10/20 0001      AROM   Overall AROM Comments DF 5, PF 60  [facility] Adult PT Treatment/Exercise - 11/10/20 0001      Lumbar Exercises: Aerobic   Nustep L 4 5 min      Knee/Hip Exercises: Seated   Long Arc Quad Strengthening;Right;4 sets;5 reps   2#   Hamstring Curl Right;2 sets;Strengthening;10 reps   yellow tband   Sit to Sand 2 sets;5 reps;without UE support   feet on airex, knee pain- decreased eccentric control     Ankle Exercises: Standing   Other Standing Ankle Exercises sit fit DF/DF, Inv, Ev 10 each   attempted circles but increased knee pain     Ankle Exercises: Seated   Other Seated Ankle Exercises yellow tband 4 way 10 wach    Other Seated Ankle Exercises rocker board 10 each way                    PT Short Term Goals - 11/10/20 1114      PT SHORT TERM GOAL #1   Title Pt will be independent with initial HEP    Status Partially Met             PT Long Term Goals - 11/10/20 1114       PT LONG TERM GOAL #1   Title Pt will be independent with advanced HEP    Status New      PT LONG TERM GOAL #2   Title Pt will demo R ankle ROM symmetrical to left with </= 1/10 pain    Status On-going      PT LONG TERM GOAL #3   Title Pt will be able to negotiate 12 steps with safe alternitng pattern and  report </= 2/10 pain at knees or R ankle.    Status On-going      PT LONG TERM GOAL #4   Title BLE strength grossly >/= 4+/5    Status On-going                 Plan - 11/10/20 1129    Clinical Impression Statement improved AROM of RT ankle and tolerated ankle ex well bt did cause some increased knee pain. explained to pt importance of strengthening to unload stress on joints. pt was limited by knee pain throughout session and modifiied accordingly- pt getting injections BIL Knee on Friday    PT Treatment/Interventions ADLs/Self Care Home Management;Electrical Stimulation;Cryotherapy;Moist Heat;Gait training;Neuromuscular re-education;Balance training;Therapeutic exercise;Therapeutic activities;Functional mobility training;Stair training;Patient/family education;Manual techniques;Energy conservation;Vasopneumatic Device;Taping;Scar mobilization    PT Next Visit Plan progress func ex as tolerated- may need to add in knee goals           Patient will benefit from skilled therapeutic intervention in order to improve the following deficits and impairments:  Abnormal gait,Decreased range of motion,Difficulty walking,Increased muscle spasms,Pain,Impaired flexibility,Hypomobility,Decreased scar mobility,Decreased balance,Decreased mobility,Decreased strength,Increased edema,Postural dysfunction  Visit Diagnosis: Pain in right ankle and joints of right foot  Muscle weakness (generalized)  Chronic pain of right knee     Problem List Patient Active Problem List   Diagnosis Date Noted  . Esophageal dysphagia 11/05/2020  . Odontogenic infection of jaw 06/10/2020  . OSA  (obstructive sleep apnea) 04/01/2020  . Moderate persistent asthma with acute exacerbation 12/30/2019  . Chronic coughing 12/30/2019  . Seasonal allergies 12/30/2019  . Loud snoring 12/16/2019  . Prediabetes 01/04/2019  . Obesity (BMI 30-39.9) 01/04/2019  . Ganglion cyst 09/28/2018  . Carpal tunnel syndrome of right wrist 09/28/2018  . Chronic midline low back pain without sciatica 11/23/2017  . Elevated blood  pressure reading with diagnosis of hypertension 11/23/2017  . Chronic pain of right knee 11/23/2017  . Alcohol-induced insomnia ([facility]) 11/23/2017  . Marijuana abuse 11/23/2017  . Tobacco dependence 11/23/2017  . Alcohol abuse 07/30/2016  . S/P ACL reconstruction 01/31/2014  . DEPRESSION 11/17/2008  . ALLERGIC RHINITIS 11/17/2008  . GERD 11/17/2008    Reinhard Schack,ANGIE PTA 11/10/2020, 11:34 AM  [facility]. [place], [place], [address] Phone: [phone]   Fax:  [phone]  Name: [name] [name] MRN: [id number] Date of Birth: [birth year]

## 2020-11-11 ENCOUNTER — Ambulatory Visit: Payer: Self-pay

## 2020-11-11 DIAGNOSIS — R600 Localized edema: Secondary | ICD-10-CM

## 2020-11-11 DIAGNOSIS — M25671 Stiffness of right ankle, not elsewhere classified: Secondary | ICD-10-CM

## 2020-11-11 DIAGNOSIS — R2689 Other abnormalities of gait and mobility: Secondary | ICD-10-CM

## 2020-11-11 DIAGNOSIS — G8929 Other chronic pain: Secondary | ICD-10-CM

## 2020-11-11 DIAGNOSIS — M6281 Muscle weakness (generalized): Secondary | ICD-10-CM

## 2020-11-11 DIAGNOSIS — M25571 Pain in right ankle and joints of right foot: Secondary | ICD-10-CM

## 2020-11-11 DIAGNOSIS — M25572 Pain in left ankle and joints of left foot: Secondary | ICD-10-CM

## 2020-11-11 NOTE — Therapy (Signed)
[facility]. [place], [place], [address] Phone: [phone]   Fax:  [phone]  Physical Therapy Treatment  Patient Details  Name: [name] [name] MRN: [id number] Date of Birth: 1966-06-13 Referring Provider (PT): Cannon Kettle DPM   Encounter Date: 11/11/2020   PT End of Session - 11/11/20 1625    Visit Number 5    Number of Visits 17    Date for PT Re-Evaluation 12/09/20    Authorization Type CAFA    PT Start Time 1618    PT Stop Time 1656    PT Time Calculation (min) 38 min    Activity Tolerance Patient tolerated treatment well;Patient limited by pain    Behavior During Therapy [facility] for tasks assessed/performed           Past Medical History:  Diagnosis Date  . Alcohol use disorder, moderate, dependence ([facility])    06-09-2020  per pt not taking naltrexone as prescribed since august 2021, unable to afford it  . Allergic rhinitis, seasonal   . Bipolar 1 disorder ([facility])   . Chronic constipation   . Chronic low back pain   . Chronic pain of right knee   . DDD (degenerative disc disease)   . GERD (gastroesophageal reflux disease)   . History of 2019 novel coronavirus disease (COVID-19) 05/2019   06-09-2020  per pt residual chronic cough and intermittant sob  . History of gastric ulcer   . Hyperlipidemia   . Hypertension   . MDD (major depressive disorder)   . Migraine   . Mild obstructive sleep apnea    sleep study in epic 01-22-2020, recommended pt to have cpap titrate ,  per pt due to pain have not gone back   . Moderate persistent asthma    followed by pcp  . Osteoarthritis   . Post-COVID chronic cough   . Pre-diabetes   . Sciatica   . SOB (shortness of breath)    06-09-2020  per pt intermittant due to residual from covid   . Wears glasses   . Wears glasses     Past Surgical History:  Procedure Laterality Date  . BUNIONECTOMY Left 2011  . HEEL SPUR SURGERY Right 2012  . KNEE ARTHROSCOPY WITH  ANTERIOR CRUCIATE LIGAMENT (ACL) REPAIR WITH HAMSTRING GRAFT Right 01/31/2014   Procedure: RIGHT KNEE ARTHROSCOPY WITH ALLOGRAFT (ACL) ANTERIOR CRUCIATE LIGAMENT RECONSTRUCTON PARTIAL MENISCECTOMY VERSES REPAIR ;  Surgeon: Sydnee Cabal, MD;  Location: [facility];  Service: Orthopedics;  Laterality: Right;  . PLANTAR FASCIA RELEASE Right 06/15/2020   Procedure: ENDOSCOPIC PLANTAR FASCIOTOMY WITH EXCISION OF PAINFUL SCAR;  Surgeon: Landis Martins, DPM;  Location: [facility];  Service: Podiatry;  Laterality: Right;  LOCAL  . TOTAL ABDOMINAL HYSTERECTOMY W/ BILATERAL SALPINGOOPHORECTOMY  04-20-2006    There were no vitals filed for this visit.   Subjective Assessment - 11/11/20 1621    Subjective Getting knee injections on friday. Right ankle 8/10 today and has compression sleeve on it. Right knee pain also 8/10 today    Pertinent History HTN, Surgery on R knee x2 for OA a few yrs ago. OA B knees and back. History of substance abuse sober 18 years, neuropathy    Patient Stated Goals To have less pain, be able to do steps, walk without as much pain    Currently in Pain? Yes    Pain Score 8     Pain Location Knee    Pain Orientation Right  Pain Descriptors / Indicators Tightness    Multiple Pain Sites Yes    Pain Score 8    Pain Location Foot    Pain Orientation Right              [facility] Adult PT Treatment/Exercise - 11/11/20 0001      Lumbar Exercises: Aerobic   Nustep L1 x 5 min      Knee/Hip Exercises: Seated   Long Arc Quad Strengthening;Right;2 sets;10 reps    Hamstring Curl Right;2 sets;Strengthening;10 reps   yellow TB     Manual Therapy   Manual Therapy Joint mobilization;Soft tissue mobilization;Myofascial release;Passive ROM    Manual therapy comments RIGHT ankle: gentle distraction with DF stretch in subtalar neutral, gentle distraction with M/L subtalar glides, gentle MT mobs DF/PF. STM achilles tendon. Gentle distraction with PROM right  knee within pain free ranges      Ankle Exercises: Seated   Heel Raises 20 reps    Toe Raise 20 reps    Other Seated Ankle Exercises yellow tband 4ankle eversion and DF 2 x 10                    PT Short Term Goals - 11/10/20 1114      PT SHORT TERM GOAL #1   Title Pt will be independent with initial HEP    Status Partially Met             PT Long Term Goals - 11/11/20 1658      PT LONG TERM GOAL #1   Title Pt will be independent with advanced HEP    Status New      PT LONG TERM GOAL #2   Title Pt will demo R ankle ROM symmetrical to left with </= 1/10 pain    Status On-going      PT LONG TERM GOAL #3   Title Pt will be able to negotiate 12 steps with safe alternitng pattern, no instances of instability/LOB,  and  report </= 2/10 pain at knees or R ankle.    Status On-going      PT LONG TERM GOAL #4   Title BLE strength grossly >/= 4+/5    Status On-going      PT LONG TERM GOAL #5   Title Pt will demo symmetrical knee ROM with </=2/10 pain    Status New                 Plan - 11/11/20 1631    Clinical Impression Statement Able to tolerate slow controlled LAQs on the right with improved tolerance today. Slight decrease in right ankle pain by end of session. Discussed continuation of ROM exercises with emphasis on slow controlled motion with fair tolerance. She is getting knee injections this friday so plan to follow up regaridng knee pain and modify exercises as needed in upcoming sessions.    Personal Factors and Comorbidities Comorbidity 3+;Past/Current Experience;Time since onset of injury/illness/exacerbation;Fitness    Comorbidities B knee OA, Plantarfasciitis, Pre-diabetes, HTN, neuropathy, Hx substance abuse sober 18 years    Examination-Activity Limitations Locomotion Level;Squat;Stairs    Rehab Potential Good    PT Frequency 2x / week    PT Duration 8 weeks    PT Treatment/Interventions ADLs/Self Care Home Management;Electrical  Stimulation;Cryotherapy;Moist Heat;Gait training;Neuromuscular re-education;Balance training;Therapeutic exercise;Therapeutic activities;Functional mobility training;Stair training;Patient/family education;Manual techniques;Energy conservation;Vasopneumatic Device;Taping;Scar mobilization    PT Next Visit Plan progress func ex as tolerated, right ankle and knee ROM/strnegth within tolerance. May benefit  from ice/cryo for knee or ankle    Consulted and Agree with Plan of Care Patient           Patient will benefit from skilled therapeutic intervention in order to improve the following deficits and impairments:  Abnormal gait,Decreased range of motion,Difficulty walking,Increased muscle spasms,Pain,Impaired flexibility,Hypomobility,Decreased scar mobility,Decreased balance,Decreased mobility,Decreased strength,Increased edema,Postural dysfunction  Visit Diagnosis: Pain in right ankle and joints of right foot  Muscle weakness (generalized)  Chronic pain of right knee  Other abnormalities of gait and mobility  Stiffness of right ankle, not elsewhere classified  Chronic pain of left knee  Pain in left ankle and joints of left foot  Localized edema     Problem List Patient Active Problem List   Diagnosis Date Noted  . Esophageal dysphagia 11/05/2020  . Odontogenic infection of jaw 06/10/2020  . OSA (obstructive sleep apnea) 04/01/2020  . Moderate persistent asthma with acute exacerbation 12/30/2019  . Chronic coughing 12/30/2019  . Seasonal allergies 12/30/2019  . Loud snoring 12/16/2019  . Prediabetes 01/04/2019  . Obesity (BMI 30-39.9) 01/04/2019  . Ganglion cyst 09/28/2018  . Carpal tunnel syndrome of right wrist 09/28/2018  . Chronic midline low back pain without sciatica 11/23/2017  . Elevated blood pressure reading with diagnosis of hypertension 11/23/2017  . Chronic pain of right knee 11/23/2017  . Alcohol-induced insomnia ([facility]) 11/23/2017  . Marijuana abuse  11/23/2017  . Tobacco dependence 11/23/2017  . Alcohol abuse 07/30/2016  . S/P ACL reconstruction 01/31/2014  . DEPRESSION 11/17/2008  . ALLERGIC RHINITIS 11/17/2008  . GERD 11/17/2008    Hall Busing, PT, DPT 11/11/2020, 5:10 PM  [facility]. [place], [place], [address] Phone: [phone]   Fax:  [phone]  Name: TANYIKA BARROS MRN: [id number] Date of Birth: [birth year]

## 2020-11-12 ENCOUNTER — Ambulatory Visit: Payer: Self-pay | Admitting: Physical Therapy

## 2020-11-13 ENCOUNTER — Telehealth: Payer: Self-pay

## 2020-11-13 ENCOUNTER — Encounter: Payer: Self-pay | Admitting: Orthopaedic Surgery

## 2020-11-13 ENCOUNTER — Encounter: Payer: Self-pay | Admitting: Physician Assistant

## 2020-11-13 ENCOUNTER — Ambulatory Visit (INDEPENDENT_AMBULATORY_CARE_PROVIDER_SITE_OTHER): Payer: Self-pay | Admitting: Orthopaedic Surgery

## 2020-11-13 VITALS — Ht 64.0 in | Wt 193.0 lb

## 2020-11-13 DIAGNOSIS — M170 Bilateral primary osteoarthritis of knee: Secondary | ICD-10-CM

## 2020-11-13 MED ORDER — BUPIVACAINE HCL 0.25 % IJ SOLN
0.6600 mL | INTRAMUSCULAR | Status: AC | PRN
  Administered 2020-11-13: .66 mL via INTRA_ARTICULAR

## 2020-11-13 MED ORDER — METHYLPREDNISOLONE ACETATE 40 MG/ML IJ SUSP
13.3300 mg | INTRAMUSCULAR | Status: AC | PRN
  Administered 2020-11-13: 13.33 mg via INTRA_ARTICULAR

## 2020-11-13 MED ORDER — LIDOCAINE HCL 1 % IJ SOLN
3.0000 mL | INTRAMUSCULAR | Status: AC | PRN
  Administered 2020-11-13: 3 mL

## 2020-11-13 NOTE — Telephone Encounter (Signed)
Patient called she was seen in our office this morning she stated she received a gel injection and now her knee is in a lot of pain, patient stated her pain level is 10 she would like a call [phone]

## 2020-11-13 NOTE — Telephone Encounter (Signed)
See message below °

## 2020-11-13 NOTE — Progress Notes (Signed)
Office Visit Note   Patient: [name] [name]           Date of Birth: 1966/02/18           MRN: [id number] Visit Date: 11/13/2020              Requested by: Marcine Matar, MD [address] [place],  [place] [address] PCP: Marcine Matar, MD   Assessment & Plan: Visit Diagnoses:  1. Bilateral primary osteoarthritis of knee     Plan: Impression is bilateral knee osteoarthritis.  Today, we proceeded with bilateral knee Monovisc injections.  She will follow up with [place] as needed.  Follow-Up Instructions: Return if symptoms worsen or fail to improve.   Orders:  Orders Placed This Encounter  Procedures  . Large Joint Inj: bilateral knee   No orders of the defined types were placed in this encounter.     Procedures: Large Joint Inj: bilateral knee on 11/13/2020 10:09 AM Indications: pain Details: 22 G needle, anterolateral approach Medications (Right): 0.66 mL bupivacaine 0.25 %; 3 mL lidocaine 1 %; 13.33 mg methylPREDNISolone acetate 40 MG/ML Medications (Left): 0.66 mL bupivacaine 0.25 %; 3 mL lidocaine 1 %; 13.33 mg methylPREDNISolone acetate 40 MG/ML      Clinical Data: No additional findings.   Subjective: Chief Complaint  Patient presents with  . Right Knee - Follow-up    Monovisc  . Left Knee - Follow-up    Monovisc    HPI patient is a pleasant 55 year old female who comes in today for bilateral knee Monovisc injections.  She has not previously had viscosupplementation injections.     Objective: Vital Signs: Ht 5\' 4"  (1.626 m)   Wt 193 lb (87.5 kg)   BMI 33.13 kg/m   Physical Exam unchanged bilateral knee exam   Specialty Comments:  No specialty comments available.  Imaging: No new imaging   PMFS History: Patient Active Problem List   Diagnosis Date Noted  . Esophageal dysphagia 11/05/2020  . Odontogenic infection of jaw 06/10/2020  . OSA (obstructive sleep apnea) 04/01/2020  . Moderate persistent asthma with acute  exacerbation 12/30/2019  . Chronic coughing 12/30/2019  . Seasonal allergies 12/30/2019  . Loud snoring 12/16/2019  . Prediabetes 01/04/2019  . Obesity (BMI 30-39.9) 01/04/2019  . Ganglion cyst 09/28/2018  . Carpal tunnel syndrome of right wrist 09/28/2018  . Chronic midline low back pain without sciatica 11/23/2017  . Elevated blood pressure reading with diagnosis of hypertension 11/23/2017  . Chronic pain of right knee 11/23/2017  . 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 Marijuana abuse 11/23/2017  . Tobacco dependence 11/23/2017  . Alcohol abuse 07/30/2016  . S/P ACL reconstruction 01/31/2014  . DEPRESSION 11/17/2008  . ALLERGIC RHINITIS 11/17/2008  . GERD 11/17/2008   Past Medical History:  Diagnosis Date  . Alcohol use disorder, moderate, dependence ([facility])    06-09-2020  per pt not taking naltrexone as prescribed since august 2021, unable to afford it  . Allergic rhinitis, seasonal   . Bipolar 1 disorder ([facility])   . Chronic constipation   . Chronic low back pain   . Chronic pain of right knee   . DDD (degenerative disc disease)   . GERD (gastroesophageal reflux disease)   . History of 2019 novel coronavirus disease (COVID-19) 05/2019   06-09-2020  per pt residual chronic cough and intermittant sob  . History of gastric ulcer   . Hyperlipidemia   . Hypertension   . MDD (major depressive disorder)   .  Migraine   . Mild obstructive sleep apnea    sleep study in epic 01-22-2020, recommended pt to have cpap titrate ,  per pt due to pain have not gone back   . Moderate persistent asthma    followed by pcp  . Osteoarthritis   . Post-COVID chronic cough   . Pre-diabetes   . Sciatica   . SOB (shortness of breath)    06-09-2020  per pt intermittant due to residual from covid   . Wears glasses   . Wears glasses     Family History  Problem Relation Age of Onset  . Hypertension Father   . Breast cancer Maternal Aunt     Past Surgical History:  Procedure Laterality  Date  . BUNIONECTOMY Left 2011  . HEEL SPUR SURGERY Right 2012  . KNEE ARTHROSCOPY WITH ANTERIOR CRUCIATE LIGAMENT (ACL) REPAIR WITH HAMSTRING GRAFT Right 01/31/2014   Procedure: RIGHT KNEE ARTHROSCOPY WITH ALLOGRAFT (ACL) ANTERIOR CRUCIATE LIGAMENT RECONSTRUCTON PARTIAL MENISCECTOMY VERSES REPAIR ;  Surgeon: Eugenia Mcalpine, MD;  Location: [facility];  Service: Orthopedics;  Laterality: Right;  . PLANTAR FASCIA RELEASE Right 06/15/2020   Procedure: ENDOSCOPIC PLANTAR FASCIOTOMY WITH EXCISION OF PAINFUL SCAR;  Surgeon: Asencion Islam, DPM;  Location: [facility];  Service: Podiatry;  Laterality: Right;  LOCAL  . TOTAL ABDOMINAL HYSTERECTOMY W/ BILATERAL SALPINGOOPHORECTOMY  04-20-2006   Social History   Occupational History  . Not on file  Tobacco Use  . Smoking status: Current Every Day Smoker    Packs/day: 0.25    Years: 32.00    Pack years: 8.00    Types: Cigarettes  . Smokeless tobacco: Never Used  . Tobacco comment: per pt down to 6 cig per day  Vaping Use  . Vaping Use: Never used  Substance and Sexual Activity  . Alcohol use: Yes    Comment: alcohol use disorder;  06-09-2020  per pt last drank alcohol 06-06-2020  . Drug use: Not Currently    Types: Marijuana    Comment: occasional  marijuana--  hx  "crack" use  last used 2004--  no treatment rehab (done on her own)  . Sexual activity: Not Currently    Birth control/protection: Surgical

## 2020-11-16 ENCOUNTER — Other Ambulatory Visit: Payer: Self-pay | Admitting: Physician Assistant

## 2020-11-16 MED ORDER — HYDROCODONE-ACETAMINOPHEN 5-325 MG PO TABS: 1.0000 | ORAL_TABLET | Freq: Every day | ORAL | 0 refills | Status: DC | PRN

## 2020-11-16 NOTE — Telephone Encounter (Signed)
Sent in small rx for norco to use sparingly.  Continue to ice as needed as well

## 2020-11-16 NOTE — Telephone Encounter (Signed)
Soreness, popping is worse since gel inj. +swelling.  Taking Tylenol & Tramadol and not helping. Also rubbing Voltaren Gel on.

## 2020-11-16 NOTE — Telephone Encounter (Signed)
Will you see if still in as much pain?  I am happy to call in meds to take if still needed

## 2020-11-18 NOTE — Telephone Encounter (Signed)
Still having a lot of popping. She is concerned and would like a callback from Dr. Roda Shutters.

## 2020-11-18 NOTE — Telephone Encounter (Signed)
Called patient

## 2020-11-19 ENCOUNTER — Ambulatory Visit: Payer: Self-pay | Admitting: Physical Therapy

## 2020-11-24 ENCOUNTER — Ambulatory Visit: Payer: Self-pay | Admitting: Physical Therapy

## 2020-11-26 ENCOUNTER — Ambulatory Visit: Payer: Self-pay | Admitting: Physical Therapy

## 2020-12-01 ENCOUNTER — Ambulatory Visit: Payer: Self-pay | Admitting: Physical Therapy

## 2020-12-02 ENCOUNTER — Other Ambulatory Visit: Payer: Self-pay

## 2020-12-02 ENCOUNTER — Encounter: Payer: Self-pay | Admitting: Physician Assistant

## 2020-12-02 ENCOUNTER — Ambulatory Visit (INDEPENDENT_AMBULATORY_CARE_PROVIDER_SITE_OTHER): Payer: Medicaid Other | Admitting: Physician Assistant

## 2020-12-02 VITALS — BP 140/80 | Ht 64.0 in | Wt 191.0 lb

## 2020-12-02 DIAGNOSIS — K219 Gastro-esophageal reflux disease without esophagitis: Secondary | ICD-10-CM

## 2020-12-02 DIAGNOSIS — R1319 Other dysphagia: Secondary | ICD-10-CM

## 2020-12-02 DIAGNOSIS — Z1211 Encounter for screening for malignant neoplasm of colon: Secondary | ICD-10-CM

## 2020-12-02 NOTE — Progress Notes (Signed)
Subjective:    Patient ID: [name] [name], female    DOB: 1965-09-21, 55 y.o.   MRN: [id number]  HPI [name] is a pleasant 55 year old African-American female, new to GI today referred by Jonah Blue, MD for evaluation of epigastric pain and dysphagia. Patient has not had any prior GI evaluation. She does have history of prior EtOH and marijuana use inactive, depression, hypertension, GERD and sleep apnea. She says that she has been having recurrent symptoms over the past year with some gradual progression over the past few months. She had been given a prescription for Protonix by her PCP but says that she could not afford it and is therefore not taking it.  She says she has been eating a lot of Tums.  She has been having symptoms on a daily basis and with most meals of dysphagia primarily to solid foods and particularly with meats.  No difficulty with liquids alone.  With any solid food she is having to drink liquids to get the food to go on down and says that food traverses very slowly and sometimes sits for prolonged periods of time.  She says this is uncomfortable.  She has had some associated soreness in her lower chest with these episodes and occasionally will get coughing with these episodes as well.  She describes a lot of burning discomfort in her chest.  Appetite has been okay, weight has been stable, no melena or hematochezia, no recent changes in bowel habits. No family history of colon cancer she believes that her father had polyps. Denies any regular use of NSAIDs, denies current EtOH. No recent labs or imaging.  Review of Systems Pertinent positive and negative review of systems were noted in the above HPI section.  All other review of systems was otherwise negative.  Outpatient Encounter Medications as of 12/02/2020  Medication Sig  . acetaminophen (TYLENOL) 500 MG tablet Take 1 tablet (500 mg total) by mouth every 6 (six) hours as needed for up to 30 doses.  Marland Kitchen albuterol  (PROVENTIL) (2.5 MG/3ML) 0.083% nebulizer solution Take 3 mLs (2.5 mg total) by nebulization every 6 (six) hours as needed for wheezing or shortness of breath.  Marland Kitchen albuterol (VENTOLIN HFA) 108 (90 Base) MCG/ACT inhaler Inhale 2 puffs into the lungs every 6 (six) hours as needed for wheezing or shortness of breath. (Patient taking differently: Inhale 2 puffs into the lungs every 6 (six) hours as needed for wheezing or shortness of breath.)  . amLODipine (NORVASC) 10 MG tablet Take 1 tablet (10 mg total) by mouth daily.  . Ascorbic Acid (VITAMIN C PO) Take 1 tablet by mouth daily.  Marland Kitchen atorvastatin (LIPITOR) 10 MG tablet Take 1 tablet (10 mg total) by mouth daily.  . celecoxib (CELEBREX) 200 MG capsule Take 1 capsule (200 mg total) by mouth daily. Stop Ibuprofen  . docusate sodium (COLACE) 100 MG capsule Take 1 capsule (100 mg total) by mouth daily as needed.  . DULoxetine (CYMBALTA) 60 MG capsule Take 1 capsule (60 mg total) by mouth daily.  . fluticasone (FLONASE) 50 MCG/ACT nasal spray Place 2 sprays into both nostrils daily. (Patient taking differently: Place 2 sprays into both nostrils daily.)  . HYDROcodone-acetaminophen (NORCO) 5-325 MG tablet Take 1-2 tablets by mouth daily as needed.  Marland Kitchen ibuprofen (ADVIL) 800 MG tablet Take 1 tablet (800 mg total) by mouth every 6 (six) hours as needed. Take in between doses of pain medication for additional relief or for mild pain  . lisinopril-hydrochlorothiazide (ZESTORETIC)  20-25 MG tablet Take 1 tablet by mouth daily.  . Multiple Vitamin (MULTIVITAMIN) tablet Take 1 tablet by mouth daily.  . pantoprazole (PROTONIX) 40 MG tablet Take 1 tablet (40 mg total) by mouth daily.  . promethazine (PHENERGAN) 12.5 MG tablet Take 1 tablet (12.5 mg total) by mouth every 6 (six) hours as needed for nausea or vomiting.  Marland Kitchen Spacer/Aero-Holding Chambers (AEROCHAMBER MV) inhaler Use as instructed  . SUMAtriptan (IMITREX) 100 MG tablet Take 1 tablet earliest onset of migraine.   May repeat in 2 hours if headache persists or recurs.  Maximum 2 tablets in 24 hours. (Patient taking differently: Take 100 mg by mouth every 2 (two) hours as needed. Take 1 tablet earliest onset of migraine.  May repeat in 2 hours if headache persists or recurs.  Maximum 2 tablets in 24 hours.)  . traMADol (ULTRAM) 50 MG tablet Take 1 tablet (50 mg total) by mouth 3 (three) times daily as needed.  . zolpidem (AMBIEN) 5 MG tablet Take 1 tablet (5 mg total) by mouth at bedtime as needed for sleep.   No facility-administered encounter medications on file as of 12/02/2020.   Allergies  Allergen Reactions  . Diphenhydramine Hcl Itching  . Metoprolol Palpitations   Patient Active Problem List   Diagnosis Date Noted  . Esophageal dysphagia 11/05/2020  . Odontogenic infection of jaw 06/10/2020  . OSA (obstructive sleep apnea) 04/01/2020  . Moderate persistent asthma with acute exacerbation 12/30/2019  . Chronic coughing 12/30/2019  . Seasonal allergies 12/30/2019  . Loud snoring 12/16/2019  . Prediabetes 01/04/2019  . Obesity (BMI 30-39.9) 01/04/2019  . Ganglion cyst 09/28/2018  . Carpal tunnel syndrome of right wrist 09/28/2018  . Chronic midline low back pain without sciatica 11/23/2017  . Elevated blood pressure reading with diagnosis of hypertension 11/23/2017  . Chronic pain of right knee 11/23/2017  . 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 Marijuana abuse 11/23/2017  . Tobacco dependence 11/23/2017  . Alcohol abuse 07/30/2016  . S/P ACL reconstruction 01/31/2014  . DEPRESSION 11/17/2008  . ALLERGIC RHINITIS 11/17/2008  . GERD 11/17/2008   Social History   Socioeconomic History  . Marital status: Legally Separated    Spouse name: Not on file  . Number of children: 4  . Years of education: Not on file  . Highest education level: Not on file  Occupational History  . Not on file  Tobacco Use  . Smoking status: Current Every Day Smoker    Packs/day: 0.25    Years:  32.00    Pack years: 8.00    Types: Cigarettes  . Smokeless tobacco: Never Used  . Tobacco comment: per pt down to 6 cig per day  Vaping Use  . Vaping Use: Never used  Substance and Sexual Activity  . Alcohol use: Yes    Comment: occ  . Drug use: Yes    Types: Marijuana    Comment: occasional  marijuana--  hx  "crack" use  last used 2004--  no treatment rehab (done on her own)  . Sexual activity: Not Currently    Birth control/protection: Surgical  Other Topics Concern  . Not on file  Social History Narrative  . Not on file   Social Determinants of Health   Financial Resource Strain: Not on file  Food Insecurity: Not on file  Transportation Needs: Not on file  Physical Activity: Not on file  Stress: Not on file  Social Connections: Not on file  Intimate Partner Violence: Not on file  Ms. Olivos family history includes Breast cancer in her maternal aunt; Hypertension in her father.      Objective:    Vitals:   12/02/20 1135  BP: 140/80    Physical Exam Well-developed well-nourished African-American female in no acute distress.  Height, Weight, 191 BMI 32.7  HEENT; nontraumatic normocephalic, EOMI, PE R LA, sclera anicteric. Oropharynx; not examined today Neck; supple, no JVD Cardiovascular; regular rate and rhythm with S1-S2, no murmur rub or gallop Pulmonary; Clear bilaterally Abdomen; soft, nontender, nondistended, no palpable mass or hepatosplenomegaly, bowel sounds are active Rectal; not done today Skin; benign exam, no jaundice rash or appreciable lesions Extremities; no clubbing cyanosis or edema skin warm and dry Neuro/Psych; alert and oriented x4, grossly nonfocal mood and affect appropriate       Assessment & Plan:   #14 55 year old African-American female with 1 year history of progressive solid food dysphagia, now having symptoms on a daily basis, associated heartburn and burning discomfort. Suspect chronic GERD with subsequent peptic  stricture, cannot rule out component of esophagitis.,  Rule out occult neoplasm  #2 colon cancer screening-no prior colonoscopy, average risk #3 history of depression 4.  Hypertension 5.  Sleep apnea no oxygen use 6.  Prior history of EtOH and marijuana abuse, and active  Plan; start Protonix 40 mg p.o. every morning AC breakfast.  Patient encouraged to get this prescription as soon as she is able. Start antireflux diet and antireflux regimen, she was given Transport planner. Encouraged to eat a soft diet, avoid any larger pieces of meat and cut meat into very tiny bites and sip fluids between bites. Patient will be scheduled for EGD with possible dilation and colonoscopy with Dr. Chales Abrahams.  Both procedures were discussed in detail with patient including indications risk and benefits and she is agreeable to proceed.   Normagene Harvie Oswald Hillock PA-C 12/02/2020   Cc: Marcine Matar, MD

## 2020-12-02 NOTE — Patient Instructions (Signed)
If you are age 56 or older, your body mass index should be between 23-30. Your Body mass index is 32.79 kg/m. If this is out of the aforementioned range listed, please consider follow up with your Primary Care Provider.  If you are age 75 or younger, your body mass index should be between 19-25. Your Body mass index is 32.79 kg/m. If this is out of the aformentioned range listed, please consider follow up with your Primary Care Provider.   You have been scheduled for an endoscopy and colonoscopy. Please follow the written instructions given to you at your visit today. Please pick up your prep supplies at the pharmacy within the next 1-3 days. If you use inhalers (even only as needed), please bring them with you on the day of your procedure.  We recommend that you start the Pantoprazole 40 mg every morning before breakfast.  Follow an Anti-Reflux diet and Anti-Reflux regimen.  Follow up pending the results of your procedure of as needed.  Thank you for entrusting me with your care and choosing [facility].  Amy Esterwood, PA-C

## 2020-12-03 ENCOUNTER — Ambulatory Visit: Payer: Self-pay | Admitting: Physical Therapy

## 2020-12-06 NOTE — Progress Notes (Signed)
Agree with plan RG 

## 2020-12-10 ENCOUNTER — Ambulatory Visit: Payer: Self-pay | Admitting: Sports Medicine

## 2020-12-15 ENCOUNTER — Telehealth: Payer: Self-pay | Admitting: Physician Assistant

## 2020-12-15 MED ORDER — SUTAB 1479-225-188 MG PO TABS: 1.0000 | ORAL_TABLET | ORAL | 0 refills | Status: DC

## 2020-12-15 NOTE — Telephone Encounter (Signed)
Inbound call from patient. Have EDG/Colon scheduled 6/3. Asked if she could have other prep besides miralax. States it will make her vomit. Best contact number [phone]

## 2020-12-15 NOTE — Telephone Encounter (Signed)
Meredith Leeds has been sent to patient's pharmacy. New direction will be typed up and sent to patient.

## 2020-12-17 NOTE — Telephone Encounter (Signed)
Called and spoke with patient, I let her know that a new prep has been sent to her pharmacy. I also let her know that she would be able to find new instructions on MyChart as well as sending a copy through the mail. She advised me that her new card is coming so she will be able to pay for her medications and everything since her insurance will be corrected.

## 2020-12-23 ENCOUNTER — Other Ambulatory Visit: Payer: Self-pay | Admitting: Internal Medicine

## 2020-12-23 DIAGNOSIS — I10 Essential (primary) hypertension: Secondary | ICD-10-CM

## 2020-12-23 MED ORDER — LISINOPRIL-HYDROCHLOROTHIAZIDE 20-25 MG PO TABS: 1.0000 | ORAL_TABLET | Freq: Every day | ORAL | 1 refills | Status: AC

## 2020-12-23 NOTE — Telephone Encounter (Signed)
Medication Refill - Medication:atorvastatin (LIPITOR) 10 MG tablet lisinopril-hydrochlorothiazide (ZESTORETIC) 20-25 MG tablet   Preferred Pharmacy (with phone number or street name):  [facility] - [place], [place] - [address] Phone:  [phone]  Fax:  [phone]       Agent: Please be advised that RX refills may take up to 3 business days. We ask that you follow-up with your pharmacy.

## 2020-12-23 NOTE — Telephone Encounter (Signed)
No future visit at this time . Last labs 06/15/20

## 2020-12-23 NOTE — Telephone Encounter (Signed)
Requested medication (s) are due for refill today: expired medication  Requested medication (s) are on the active medication list: yes   Last refill:  12/18/19 #90 3 refills   Future visit scheduled: no   Notes to clinic:  expired medication do you want to renew Rx? Last labs 12/16/19     Requested Prescriptions  Pending Prescriptions Disp Refills   atorvastatin (LIPITOR) 10 MG tablet 90 tablet 3    Sig: Take 1 tablet (10 mg total) by mouth daily.      Cardiovascular:  Antilipid - Statins Failed - 12/23/2020 11:30 AM      Failed - Total Cholesterol in normal range and within 360 days    Cholesterol, Total  Date Value Ref Range Status  12/16/2019 232 (H) 100 - 199 mg/dL Final          Failed - LDL in normal range and within 360 days    LDL Chol Calc (NIH)  Date Value Ref Range Status  12/16/2019 152 (H) 0 - 99 mg/dL Final          Failed - HDL in normal range and within 360 days    HDL  Date Value Ref Range Status  12/16/2019 44 >39 mg/dL Final          Failed - Triglycerides in normal range and within 360 days    Triglycerides  Date Value Ref Range Status  12/16/2019 195 (H) 0 - 149 mg/dL Final          Passed - Patient is not pregnant      Passed - Valid encounter within last 12 months    Recent Outpatient Visits           1 month ago Essential hypertension   [facility] Marcine Matar, MD   6 months ago Essential hypertension   [facility] [place], Valetta Mole, MD   7 months ago Chronic pain of right knee   [facility] Grayce Sessions, NP   7 months ago Moderate persistent asthma with acute exacerbation   [facility] [place], Gavin Pound B, MD   8 months ago Moderate episode of recurrent major depressive disorder [facility])   [facility] [place], Binnie Rail, MD                 Signed Prescriptions Disp Refills    lisinopril-hydrochlorothiazide (ZESTORETIC) 20-25 MG tablet 90 tablet 1    Sig: Take 1 tablet by mouth daily.      Cardiovascular:  ACEI + Diuretic Combos Failed - 12/23/2020 11:30 AM      Failed - Na in normal range and within 180 days    Sodium  Date Value Ref Range Status  06/15/2020 139 135 - 145 mmol/L Final  12/16/2019 142 134 - 144 mmol/L Final          Failed - K in normal range and within 180 days    Potassium  Date Value Ref Range Status  06/15/2020 3.5 3.5 - 5.1 mmol/L Final          Failed - Cr in normal range and within 180 days    Creatinine, Ser  Date Value Ref Range Status  06/15/2020 0.90 0.44 - 1.00 mg/dL Final          Failed - Ca in normal range and within 180 days    Calcium  Date Value  Ref Range Status  12/19/2019 9.4 8.9 - 10.3 mg/dL Final   Calcium, Ion  Date Value Ref Range Status  06/15/2020 1.17 1.15 - 1.40 mmol/L Final          Failed - Last BP in normal range    BP Readings from Last 1 Encounters:  12/02/20 140/80          Passed - Patient is not pregnant      Passed - Valid encounter within last 6 months    Recent Outpatient Visits           1 month ago Essential hypertension   [facility] Marcine Matar, MD   6 months ago Essential hypertension   [facility] [place], Valetta Mole, MD   7 months ago Chronic pain of right knee   [facility] Grayce Sessions, NP   7 months ago Moderate persistent asthma with acute exacerbation   [facility] Jonah Blue B, MD   8 months ago Moderate episode of recurrent major depressive disorder [facility])   [facility] Marcine Matar, MD

## 2020-12-24 ENCOUNTER — Telehealth: Payer: Self-pay | Admitting: Sports Medicine

## 2020-12-24 MED ORDER — ATORVASTATIN CALCIUM 10 MG PO TABS: 10.0000 mg | ORAL_TABLET | Freq: Every day | ORAL | 0 refills | Status: DC

## 2020-12-24 NOTE — Telephone Encounter (Signed)
Patient is requesting a refill on sleeping medication (ambien), and hydrocodone (norco) for foot pain. [facility].

## 2020-12-28 ENCOUNTER — Ambulatory Visit (HOSPITAL_BASED_OUTPATIENT_CLINIC_OR_DEPARTMENT_OTHER): Payer: Medicaid Other | Admitting: Internal Medicine

## 2021-01-08 ENCOUNTER — Ambulatory Visit (AMBULATORY_SURGERY_CENTER): Payer: Medicaid Other | Admitting: Gastroenterology

## 2021-01-08 ENCOUNTER — Other Ambulatory Visit: Payer: Self-pay

## 2021-01-08 ENCOUNTER — Encounter: Payer: Self-pay | Admitting: Gastroenterology

## 2021-01-08 ENCOUNTER — Encounter: Payer: Medicaid Other | Admitting: Gastroenterology

## 2021-01-08 VITALS — BP 142/66 | HR 63 | Temp 96.9°F | Resp 12 | Ht 64.0 in | Wt 191.0 lb

## 2021-01-08 DIAGNOSIS — K449 Diaphragmatic hernia without obstruction or gangrene: Secondary | ICD-10-CM | POA: Diagnosis not present

## 2021-01-08 DIAGNOSIS — K222 Esophageal obstruction: Secondary | ICD-10-CM

## 2021-01-08 DIAGNOSIS — K219 Gastro-esophageal reflux disease without esophagitis: Secondary | ICD-10-CM

## 2021-01-08 DIAGNOSIS — K2970 Gastritis, unspecified, without bleeding: Secondary | ICD-10-CM

## 2021-01-08 DIAGNOSIS — R1319 Other dysphagia: Secondary | ICD-10-CM | POA: Diagnosis not present

## 2021-01-08 DIAGNOSIS — K208 Other esophagitis without bleeding: Secondary | ICD-10-CM

## 2021-01-08 DIAGNOSIS — K319 Disease of stomach and duodenum, unspecified: Secondary | ICD-10-CM

## 2021-01-08 MED ORDER — SODIUM CHLORIDE 0.9 % IV SOLN: 500.0000 mL | Freq: Once | INTRAVENOUS | Status: DC

## 2021-01-08 NOTE — Patient Instructions (Signed)
Impression/Recommendations:  Gastritis, post-dilation diet, and hiatal hernia handouts given to patient.  Continue Protonix 40 mg by mouth daily.  Avoid aspirin, ibuprofen, naproxen, or other NSAID drugs.  Await pathology results.  Stop smoking.  YOU HAD AN ENDOSCOPIC PROCEDURE TODAY AT THE [facility]:   Refer to the procedure report that was given to you for any specific questions about what was found during the examination.  If the procedure report does not answer your questions, please call your gastroenterologist to clarify.  If you requested that your care partner not be given the details of your procedure findings, then the procedure report has been included in a sealed envelope for you to review at your convenience later.  YOU SHOULD EXPECT: Some feelings of bloating in the abdomen. Passage of more gas than usual.  Walking can help get rid of the air that was put into your GI tract during the procedure and reduce the bloating. If you had a lower endoscopy (such as a colonoscopy or flexible sigmoidoscopy) you may notice spotting of blood in your stool or on the toilet paper. If you underwent a bowel prep for your procedure, you may not have a normal bowel movement for a few days.  Please Note:  You might notice some irritation and congestion in your nose or some drainage.  This is from the oxygen used during your procedure.  There is no need for concern and it should clear up in a day or so.  SYMPTOMS TO REPORT IMMEDIATELY:  Following upper endoscopy (EGD)  Vomiting of blood or coffee ground material  New chest pain or pain under the shoulder blades  Painful or persistently difficult swallowing  New shortness of breath  Fever of 100F or higher  Black, tarry-looking stools  For urgent or emergent issues, a gastroenterologist can be reached at any hour by calling [phone]. Do not use MyChart messaging for urgent concerns.    DIET:  We do recommend a small meal  at first, but then you may proceed to your regular diet.  Drink plenty of fluids but you should avoid alcoholic beverages for 24 hours.  ACTIVITY:  You should plan to take it easy for the rest of today and you should NOT DRIVE or use heavy machinery until tomorrow (because of the sedation medicines used during the test).    FOLLOW UP: Our staff will call the number listed on your records 48-72 hours following your procedure to check on you and address any questions or concerns that you may have regarding the information given to you following your procedure. If we do not reach you, we will leave a message.  We will attempt to reach you two times.  During this call, we will ask if you have developed any symptoms of COVID 19. If you develop any symptoms (ie: fever, flu-like symptoms, shortness of breath, cough etc.) before then, please call [phone].  If you test positive for Covid 19 in the 2 weeks post procedure, please call and report this information to [place].    If any biopsies were taken you will be contacted by phone or by letter within the next 1-3 weeks.  Please call us at [phone] if you have not heard about the biopsies in 3 weeks.    SIGNATURES/CONFIDENTIALITY: You and/or your care partner have signed paperwork which will be entered into your electronic medical record.  These signatures attest to the fact that that the information above on your After Visit Summary  has been reviewed and is understood.  Full responsibility of the confidentiality of this discharge information lies with you and/or your care-partner.

## 2021-01-08 NOTE — Op Note (Signed)
[facility] Patient Name: [name] [name] Procedure Date: 01/08/2021 3:27 PM MRN: [id number] Endoscopist: Lynann Bologna , MD Age: 55 Referring MD:  Date of Birth: 09/28/1965 Gender: Female Account #: [id number] Procedure:                Upper GI endoscopy Indications:              Dysphagia, GERD Medicines:                Monitored Anesthesia Care Procedure:                Pre-Anesthesia Assessment:                           - Prior to the procedure, a History and Physical                            was performed, and patient medications and                            allergies were reviewed. The patient's tolerance of                            previous anesthesia was also reviewed. The risks                            and benefits of the procedure and the sedation                            options and risks were discussed with the patient.                            All questions were answered, and informed consent                            was obtained. Prior Anticoagulants: The patient has                            taken no previous anticoagulant or antiplatelet                            agents. ASA Grade Assessment: II - A patient with                            mild systemic disease. After reviewing the risks                            and benefits, the patient was deemed in                            satisfactory condition to undergo the procedure.                           After obtaining informed consent, the endoscope was  passed under direct vision. Throughout the                            procedure, the patient's blood pressure, pulse, and                            oxygen saturations were monitored continuously. The                            Endoscope was introduced through the mouth, and                            advanced to the second part of duodenum. The upper                            GI endoscopy was accomplished without  difficulty.                            The patient tolerated the procedure well. Scope In: Scope Out: Findings:                 The lower third of the esophagus was mildly                            tortuous. Biopsies were obtained from the proximal                            and distal esophagus with cold forceps for                            histology of suspected eosinophilic esophagitis.                           A non-obstructing and mild Schatzki ring was found                            at the gastroesophageal junction, 40 cm from the                            incisors with luminal diameter of 14 mm. The scope                            was withdrawn. Dilation was performed with a                            Maloney dilator with mild resistance at 50 Fr and                            52 Fr.                           A small transient hiatal hernia was present.  Diffuse mild inflammation characterized by                            congestion (edema) and erythema was found in the                            entire examined stomach. Biopsies were taken with a                            cold forceps for histology from throughout the                            stomach using Sydney protocol.                           The examined duodenum was normal. Complications:            No immediate complications. Estimated Blood Loss:     Estimated blood loss: none. Impression:               - Schatzki ring s/p esophageal dilatation.                           - Small hiatal hernia.                           - Gastritis. Biopsied. Recommendation:           - Patient has a contact number available for                            emergencies. The signs and symptoms of potential                            delayed complications were discussed with the                            patient. Return to normal activities tomorrow.                            Written discharge  instructions were provided to the                            patient.                           - Post dilatation diet.                           - Continue present medications including Protonix                            40 mg p.o daily #30, 11 refills                           - Avoid aspirin, ibuprofen, naproxen, or other  non-steroidal anti-inflammatory drugs.                           - Await pathology results.                           - Stop smoking.                           - The findings and recommendations were discussed                            with the patient's family.                           - Recommend screening colonoscopy. She is aware.                            She will let us know when she decides to proceed                            with colonoscopy. Lynann Bolognaajesh Davyon Fisch, MD 01/08/2021 3:59:18 PM This report has been signed electronically.

## 2021-01-08 NOTE — Progress Notes (Signed)
VS by CW. ?

## 2021-01-08 NOTE — Progress Notes (Signed)
Called to room to assist during endoscopic procedure.  Patient ID and intended procedure confirmed with present staff. Received instructions for my participation in the procedure from the performing physician.  

## 2021-01-08 NOTE — Progress Notes (Signed)
Pt. Reports abdomen feels sore.  Pt. Belching air.  Denies chest pain/discomfort.  Pt. Went to restroom, passed a lot of air.  Abdomen less firm.  Pt. Feels ready for discharge.  Reviewed discharge instructions with pt.  Told pt. To follow dilation diet and not overload system.

## 2021-01-08 NOTE — Progress Notes (Signed)
PT taken to PACU. Monitors in place. VSS. Report given to RN. 

## 2021-01-12 ENCOUNTER — Telehealth: Payer: Self-pay | Admitting: *Deleted

## 2021-01-12 NOTE — Telephone Encounter (Signed)
Attempted f/u phone call. No answer. Left message. °

## 2021-01-12 NOTE — Telephone Encounter (Signed)
  Follow up Call-  Call back number 01/08/2021  Post procedure Call Back phone  # [phone]  Permission to leave phone message Yes  Some recent data might be hidden     Patient questions:  Do you have a fever, pain , or abdominal swelling? No. Pain Score  0 *  Have you tolerated food without any problems? Yes.    Have you been able to return to your normal activities? Yes.    Do you have any questions about your discharge instructions: Diet   No. Medications  No. Follow up visit  No.  Do you have questions or concerns about your Care? No.  Actions: * If pain score is 4 or above: No action needed, pain <4.  Pt reports that she still feels like her food is "getting hung up".  Not as bad before but she still notices it.  She is able to eat and drink without pain.  Advised her that she would hear from Dr. Chales Abrahams once the biopsies are back and that she should let him know if her symptoms do not improve.    1. Have you developed a fever since your procedure? no  2.   Have you had an respiratory symptoms (SOB or cough) since your procedure? no  3.   Have you tested positive for COVID 19 since your procedure no  4.   Have you had any family members/close contacts diagnosed with the COVID 19 since your procedure?  no   If yes to any of these questions please route to Laverna Peace, RN and Karlton Lemon, RN

## 2021-01-14 ENCOUNTER — Ambulatory Visit: Payer: Self-pay | Admitting: Sports Medicine

## 2021-01-21 ENCOUNTER — Encounter: Payer: Self-pay | Admitting: Gastroenterology

## 2021-01-22 ENCOUNTER — Other Ambulatory Visit: Payer: Self-pay | Admitting: Sports Medicine

## 2021-01-22 ENCOUNTER — Other Ambulatory Visit: Payer: Self-pay | Admitting: Internal Medicine

## 2021-01-22 ENCOUNTER — Other Ambulatory Visit: Payer: Self-pay | Admitting: Physician Assistant

## 2021-01-22 DIAGNOSIS — F331 Major depressive disorder, recurrent, moderate: Secondary | ICD-10-CM

## 2021-01-22 NOTE — Telephone Encounter (Signed)
Please advise 

## 2021-01-22 NOTE — Telephone Encounter (Signed)
Wrong taylor previously

## 2021-01-28 ENCOUNTER — Ambulatory Visit: Payer: Self-pay | Admitting: Sports Medicine

## 2021-02-07 ENCOUNTER — Other Ambulatory Visit: Payer: Self-pay

## 2021-02-07 ENCOUNTER — Emergency Department (HOSPITAL_BASED_OUTPATIENT_CLINIC_OR_DEPARTMENT_OTHER): Payer: Medicaid Other

## 2021-02-07 ENCOUNTER — Emergency Department (HOSPITAL_BASED_OUTPATIENT_CLINIC_OR_DEPARTMENT_OTHER)
Admission: EM | Admit: 2021-02-07 | Discharge: 2021-02-07 | Disposition: A | Discharge status: Home / Self Care | Payer: Medicaid Other | Attending: Emergency Medicine | Admitting: Emergency Medicine

## 2021-02-07 ENCOUNTER — Encounter (HOSPITAL_BASED_OUTPATIENT_CLINIC_OR_DEPARTMENT_OTHER): Payer: Self-pay | Admitting: *Deleted

## 2021-02-07 DIAGNOSIS — I10 Essential (primary) hypertension: Secondary | ICD-10-CM | POA: Diagnosis not present

## 2021-02-07 DIAGNOSIS — J4540 Moderate persistent asthma, uncomplicated: Secondary | ICD-10-CM | POA: Diagnosis not present

## 2021-02-07 DIAGNOSIS — M25561 Pain in right knee: Secondary | ICD-10-CM | POA: Diagnosis present

## 2021-02-07 DIAGNOSIS — F17210 Nicotine dependence, cigarettes, uncomplicated: Secondary | ICD-10-CM | POA: Diagnosis not present

## 2021-02-07 DIAGNOSIS — W06XXXA Fall from bed, initial encounter: Secondary | ICD-10-CM | POA: Insufficient documentation

## 2021-02-07 DIAGNOSIS — Z79899 Other long term (current) drug therapy: Secondary | ICD-10-CM | POA: Diagnosis not present

## 2021-02-07 DIAGNOSIS — Z8616 Personal history of COVID-19: Secondary | ICD-10-CM | POA: Diagnosis not present

## 2021-02-07 MED ORDER — ACETAMINOPHEN 500 MG PO TABS
1000.0000 mg | ORAL_TABLET | Freq: Once | ORAL | Status: AC
  Administered 2021-02-07: 1000 mg via ORAL
  Filled 2021-02-07: qty 2

## 2021-02-07 NOTE — ED Triage Notes (Addendum)
Pt arrived with PTAR with c/o fall. Has been drinking etoh. C/o right knee pain. Pain/unable to fully bend right knee. Pain with bearing weight. Denies any other injury.

## 2021-02-07 NOTE — ED Provider Notes (Signed)
[facility] EMERGENCY DEPT Provider Note  CSN: [id number] Arrival date & time: 02/07/21 0007  Chief Complaint(s) Fall  HPI [name] [name] is a 55 y.o. female with a past medical history listed below who presents to the emergency department after mechanical fall.  Patient reports losing her balance due to alcohol use tonight.  Reports that she fell back in bed and on her butt.  Reports that she started having right knee pain.  Worse with flexing past 60 degrees and ambulating.  She denies any head trauma or loss of consciousness.  No neck pain or back pain.  No chest pain.  No abdominal pain.  No hip pain or other extremity pain.   Fall   Past Medical History Past Medical History:  Diagnosis Date   Alcohol use disorder, moderate, dependence ([place])    06-09-2020  per pt not taking naltrexone as prescribed since august 2021, unable to afford it   Allergic rhinitis, seasonal    Arthritis    Asthma    Bipolar 1 disorder ([place])    Chronic constipation    Chronic low back pain    Chronic pain of right knee    DDD (degenerative disc disease)    Depression    GERD (gastroesophageal reflux disease)    History of 2019 novel coronavirus disease (COVID-19) 05/2019   06-09-2020  per pt residual chronic cough and intermittant sob   History of gastric ulcer    Hyperlipidemia    Hypertension    MDD (major depressive disorder)    Migraine    Mild obstructive sleep apnea    sleep study in epic 01-22-2020, recommended pt to have cpap titrate ,  per pt due to pain have not gone back    Moderate persistent asthma    followed by pcp   Osteoarthritis    Post-COVID chronic cough    Pre-diabetes    Sciatica    Sleep apnea    SOB (shortness of breath)    06-09-2020  per pt intermittant due to residual from covid    Wears glasses    Wears glasses    Patient Active Problem List   Diagnosis Date Noted   Esophageal dysphagia 11/05/2020   Odontogenic infection of jaw 06/10/2020    OSA (obstructive sleep apnea) 04/01/2020   Moderate persistent asthma with acute exacerbation 12/30/2019   Chronic coughing 12/30/2019   Seasonal allergies 12/30/2019   Loud snoring 12/16/2019   Prediabetes 01/04/2019   Obesity (BMI 30-39.9) 01/04/2019   Ganglion cyst 09/28/2018   Carpal tunnel syndrome of right wrist 09/28/2018   Chronic midline low back pain without sciatica 11/23/2017   Elevated blood pressure reading with diagnosis of hypertension 11/23/2017   Chronic pain of right knee 11/23/2017   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 Authorizing Provider  acetaminophen (TYLENOL) 500 MG tablet Take 1 tablet (500 mg total) by mouth every 6 (six) hours as needed for up to 30 doses. 06/27/19   Curatolo, Adam, DO  albuterol (PROVENTIL) (2.5 MG/3ML) 0.083% nebulizer solution Take 3 mLs (2.5 mg total) by nebulization every 6 (six) hours as needed for wheezing or shortness of breath. 01/20/20   Ladell Pier, MD  albuterol (VENTOLIN HFA)  108 (90 Base) MCG/ACT inhaler Inhale 2 puffs into the lungs every 6 (six) hours as needed for wheezing or shortness of breath. Patient taking differently: Inhale 2 puffs into the lungs every 6 (six) hours as needed for wheezing or shortness of breath. 04/28/20   Ladell Pier, MD  amLODipine (NORVASC) 10 MG tablet Take 1 tablet (10 mg total) by mouth daily. 11/05/20   Ladell Pier, MD  atorvastatin (LIPITOR) 10 MG tablet Take 1 tablet (10 mg total) by mouth daily. 12/24/20   Ladell Pier, MD  celecoxib (CELEBREX) 200 MG capsule Take 1 capsule (200 mg total) by mouth daily. Stop Ibuprofen 10/15/20   Ladell Pier, MD  docusate sodium  (COLACE) 100 MG capsule Take 1 capsule (100 mg total) by mouth daily as needed. 06/15/20 06/15/21  Landis Martins, DPM  DULoxetine (CYMBALTA) 60 MG capsule TAKE ONE CAPSULE BY MOUTH ONCE DAILY (AM) 01/28/21   Mayers, Cari S, PA-C  fluticasone (FLONASE) 50 MCG/ACT nasal spray Place 2 sprays into both nostrils daily. Patient taking differently: Place 2 sprays into both nostrils daily. 12/30/19   Elsie Stain, MD  HYDROcodone-acetaminophen (NORCO) 5-325 MG tablet Take 1-2 tablets by mouth daily as needed. 11/16/20   Aundra Dubin, PA-C  ibuprofen (ADVIL) 800 MG tablet Take 1 tablet (800 mg total) by mouth every 6 (six) hours as needed. Take in between doses of pain medication for additional relief or for mild pain 10/01/20   Landis Martins, DPM  lisinopril-hydrochlorothiazide (ZESTORETIC) 20-25 MG tablet Take 1 tablet by mouth daily. 12/23/20   Ladell Pier, MD  Multiple Vitamin (MULTIVITAMIN) tablet Take 1 tablet by mouth daily.    [provider]  pantoprazole (PROTONIX) 40 MG tablet Take 1 tablet (40 mg total) by mouth daily. 11/05/20   Ladell Pier, MD  promethazine (PHENERGAN) 12.5 MG tablet Take 1 tablet (12.5 mg total) by mouth every 6 (six) hours as needed for nausea or vomiting. 06/15/20   Landis Martins, DPM  Sodium Sulfate-Mag Sulfate-KCl (SUTAB) [phone] MG TABS Take 1 kit by mouth as directed. 12/15/20   Esterwood, Amy S, PA-C  Spacer/Aero-Holding Chambers (AEROCHAMBER MV) inhaler Use as instructed 12/30/19   Elsie Stain, MD  SUMAtriptan (IMITREX) 100 MG tablet Take 1 tablet earliest onset of migraine.  May repeat in 2 hours if headache persists or recurs.  Maximum 2 tablets in 24 hours. Patient taking differently: Take 100 mg by mouth every 2 (two) hours as needed. Take 1 tablet earliest onset of migraine.  May repeat in 2 hours if headache persists or recurs.  Maximum 2 tablets in 24 hours. 11/13/19   Pieter Partridge, DO  traMADol (ULTRAM) 50 MG tablet Take 1  tablet (50 mg total) by mouth 3 (three) times daily as needed. 11/04/20   Aundra Dubin, PA-C  zolpidem (AMBIEN) 5 MG tablet TAKE ONE TABLET BY MOUTH DAILY AT BEDTIME. AS NEEDED FOR SLEEP 01/22/21   Landis Martins, DPM  Past Surgical History Past Surgical History:  Procedure Laterality Date   BUNIONECTOMY Left 2011   HEEL SPUR SURGERY Right 2012   KNEE ARTHROSCOPY WITH ANTERIOR CRUCIATE LIGAMENT (ACL) REPAIR WITH HAMSTRING GRAFT Right 01/31/2014   Procedure: RIGHT KNEE ARTHROSCOPY WITH ALLOGRAFT (ACL) ANTERIOR CRUCIATE LIGAMENT RECONSTRUCTON PARTIAL MENISCECTOMY VERSES REPAIR ;  Surgeon: Sydnee Cabal, MD;  Location: [facility];  Service: Orthopedics;  Laterality: Right;   PLANTAR FASCIA RELEASE Right 06/15/2020   Procedure: ENDOSCOPIC PLANTAR FASCIOTOMY WITH EXCISION OF PAINFUL SCAR;  Surgeon: Landis Martins, DPM;  Location: [facility];  Service: Podiatry;  Laterality: Right;  LOCAL   TOTAL ABDOMINAL HYSTERECTOMY W/ BILATERAL SALPINGOOPHORECTOMY  04-20-2006   Family History Family History  Problem Relation Age of Onset   Hypertension Father    Breast cancer Maternal Aunt    Pancreatic cancer Sister    Colon cancer Neg Hx    Esophageal cancer Neg Hx    Rectal cancer Neg Hx    Stomach cancer Neg Hx     Social History Social History   Tobacco Use   Smoking status: Every Day    Packs/day: 0.25    Years: 32.00    Pack years: 8.00    Types: Cigarettes   Smokeless tobacco: Never   Tobacco comments:    per pt down to 6 cig per day  Vaping Use   Vaping Use: Never used  Substance Use Topics   Alcohol use: Yes    Comment: occasional   Drug use: Yes    Types: Marijuana    Comment: occasional  marijuana--  hx  "crack" use  last used 2004--  no treatment rehab (done on her own)   Allergies Diphenhydramine hcl and  Metoprolol  Review of Systems Review of Systems All other systems are reviewed and are negative for acute change except as noted in the HPI  Physical Exam Vital Signs  I have reviewed the triage vital signs BP (!) 147/95 (BP Location: Left Arm)   Pulse 95   Temp 98.4 F (36.9 C) (Oral)   Resp 18   SpO2 97%   Physical Exam Vitals reviewed.  Constitutional:      General: She is not in acute distress.    Appearance: She is well-developed. She is not diaphoretic.  HENT:     Head: Normocephalic and atraumatic.     Right Ear: External ear normal.     Left Ear: External ear normal.     Nose: Nose normal.  Eyes:     General: No scleral icterus.       Right eye: No discharge.        Left eye: No discharge.     Conjunctiva/sclera: Conjunctivae normal.     Pupils: Pupils are equal, round, and reactive to light.  Neck:     Trachea: Phonation normal.  Cardiovascular:     Rate and Rhythm: Normal rate and regular rhythm.     Pulses:          Radial pulses are 2+ on the right side and 2+ on the left side.       Dorsalis pedis pulses are 2+ on the right side and 2+ on the left side.     Heart sounds: Normal heart sounds. No murmur heard.   No friction rub. No gallop.  Pulmonary:     Effort: Pulmonary effort is normal. No respiratory distress.     Breath sounds: Normal breath sounds. No stridor. No wheezing.  Abdominal:     General: There is no distension.     Palpations: Abdomen is soft.     Tenderness: There is no abdominal tenderness.  Musculoskeletal:        General: Normal range of motion.     Cervical back: Normal range of motion and neck supple. No bony tenderness.     Thoracic back: No bony tenderness.     Lumbar back: No bony tenderness.     Right knee: Swelling present. No deformity. Tenderness present over the medial joint line and lateral joint line. No patellar tendon tenderness. Normal alignment.     Comments: Clavicles stable. Chest stable to AP/Lat  compression. Pelvis stable to Lat compression. No obvious extremity deformity. No chest or abdominal wall contusion.  Skin:    General: Skin is warm and dry.     Findings: No erythema or rash.  Neurological:     Mental Status: She is alert and oriented to person, place, and time.     Comments: Moving all extremities  Psychiatric:        Behavior: Behavior normal.    ED Results and Treatments Labs (all labs ordered are listed, but only abnormal results are displayed) Labs Reviewed - No data to display                                                                                                                       EKG  EKG Interpretation  Date/Time:    Ventricular Rate:    PR Interval:    QRS Duration:   QT Interval:    QTC Calculation:   R Axis:     Text Interpretation:          Radiology DG Knee Complete 4 Views Right  Result Date: 02/07/2021 CLINICAL DATA:  Right knee pain after a fall EXAM: RIGHT KNEE - COMPLETE 4+ VIEW COMPARISON:  05/26/2020 FINDINGS: Postoperative changes consistent with previous ACL repair. No evidence of acute fracture or dislocation. No focal bone lesion or bone destruction. Mild medial compartment narrowing suggesting degenerative change. No significant effusions. Soft tissues are unremarkable. IMPRESSION: Postoperative changes consistent with ACL repair. No acute bony abnormalities. Electronically Signed   By: Lucienne Capers M.D.   On: 02/07/2021 00:58    Pertinent labs & imaging results that were available during my care of the patient were reviewed by me and considered in my medical decision making (see chart for details).  Medications Ordered in ED Medications  acetaminophen (TYLENOL) tablet 1,000 mg (1,000 mg Oral Given 02/07/21 0125)  Procedures Procedures  (including critical care time)  Medical Decision  Making / ED Course I have reviewed the nursing notes for this encounter and the patient's prior records (if available in EHR or on provided paperwork).   AISLEY WHAN was evaluated in Emergency Department on 02/07/2021 for the symptoms described in the history of present illness. She was evaluated in the context of the global COVID-19 pandemic, which necessitated consideration that the patient might be at risk for infection with the SARS-CoV-2 virus that causes COVID-19. Institutional protocols and algorithms that pertain to the evaluation of patients at risk for COVID-19 are in a state of rapid change based on information released by regulatory bodies including the CDC and federal and state organizations. These policies and algorithms were followed during the patient's care in the ED.  Plain film without evidence of fracture/dislocation.  Likely soft tissue injury.  Possible sprain.  Provided with Tylenol, knee immobilizer and crutches.  Recommended follow-up with orthopedic surgery if pain persists.      Final Clinical Impression(s) / ED Diagnoses Final diagnoses:  Right knee pain   The patient appears reasonably screened and/or stabilized for discharge and I doubt any other medical condition or other [facility] requiring further screening, evaluation, or treatment in the ED at this time prior to discharge. Safe for discharge with strict return precautions.  Disposition: Discharge  Condition: Good  I have discussed the results, Dx and Tx plan with the patient/family who expressed understanding and agree(s) with the plan. Discharge instructions discussed at length. The patient/family was given strict return precautions who verbalized understanding of the instructions. No further questions at time of discharge.    ED Discharge Orders     None        Follow Up: Ladell Pier, MD [address] [place] [address] [phone]  Call    Orthopedic surgery  Call  if  symptoms do not improve or  worsen     This chart was dictated using voice recognition software.  Despite best efforts to proofread,  errors can occur which can change the documentation meaning.    Fatima Blank, MD 02/07/21 [phone]

## 2021-02-25 ENCOUNTER — Other Ambulatory Visit: Payer: Self-pay | Admitting: Sports Medicine

## 2021-02-25 ENCOUNTER — Ambulatory Visit (INDEPENDENT_AMBULATORY_CARE_PROVIDER_SITE_OTHER): Payer: Medicaid Other

## 2021-02-25 ENCOUNTER — Ambulatory Visit (INDEPENDENT_AMBULATORY_CARE_PROVIDER_SITE_OTHER): Payer: Medicaid Other | Admitting: Sports Medicine

## 2021-02-25 ENCOUNTER — Other Ambulatory Visit: Payer: Self-pay

## 2021-02-25 DIAGNOSIS — I10 Essential (primary) hypertension: Secondary | ICD-10-CM | POA: Insufficient documentation

## 2021-02-25 DIAGNOSIS — M79672 Pain in left foot: Secondary | ICD-10-CM

## 2021-02-25 DIAGNOSIS — E782 Mixed hyperlipidemia: Secondary | ICD-10-CM | POA: Insufficient documentation

## 2021-02-25 DIAGNOSIS — M5136 Other intervertebral disc degeneration, lumbar region: Secondary | ICD-10-CM | POA: Insufficient documentation

## 2021-02-25 DIAGNOSIS — E559 Vitamin D deficiency, unspecified: Secondary | ICD-10-CM | POA: Insufficient documentation

## 2021-02-25 DIAGNOSIS — M722 Plantar fascial fibromatosis: Secondary | ICD-10-CM

## 2021-02-25 DIAGNOSIS — M179 Osteoarthritis of knee, unspecified: Secondary | ICD-10-CM | POA: Insufficient documentation

## 2021-02-25 DIAGNOSIS — J4520 Mild intermittent asthma, uncomplicated: Secondary | ICD-10-CM | POA: Insufficient documentation

## 2021-02-25 DIAGNOSIS — M1710 Unilateral primary osteoarthritis, unspecified knee: Secondary | ICD-10-CM | POA: Insufficient documentation

## 2021-02-25 DIAGNOSIS — G43109 Migraine with aura, not intractable, without status migrainosus: Secondary | ICD-10-CM | POA: Insufficient documentation

## 2021-02-25 MED ORDER — PREDNISONE 10 MG (21) PO TBPK: ORAL_TABLET | ORAL | 0 refills | Status: DC

## 2021-02-25 NOTE — Progress Notes (Signed)
Subjective: [name] [name] is a 55 y.o. female patient presents to office with complaint of cramping and heel pain on the left. Patient admits to post static dyskinesia for 2-1/2 months in duration. Patient has treated this problem with nothing with no relief. Denies any other pedal complaints.   Patient Active Problem List   Diagnosis Date Noted   Essential hypertension 02/25/2021   Migraine with aura 02/25/2021   Mild intermittent asthma 02/25/2021   Mixed hyperlipidemia 02/25/2021   Osteoarthritis of knee 02/25/2021   Other intervertebral disc degeneration, lumbar region 02/25/2021   Vitamin D deficiency 02/25/2021   Esophageal dysphagia 11/05/2020   Odontogenic infection of jaw 06/10/2020   OSA (obstructive sleep apnea) 04/01/2020   Moderate persistent asthma with acute exacerbation 12/30/2019   Chronic coughing 12/30/2019   Seasonal allergies 12/30/2019   Loud snoring 12/16/2019   Prediabetes 01/04/2019   Obesity (BMI 30-39.9) 01/04/2019   Ganglion cyst 09/28/2018   Carpal tunnel syndrome of right wrist 09/28/2018   Chronic midline low back pain without sciatica 11/23/2017   Elevated blood pressure reading with diagnosis of hypertension 11/23/2017   Chronic pain of right knee 11/23/2017   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 30 tablet 0   albuterol (PROVENTIL) (2.5 MG/3ML) 0.083% nebulizer solution Take 3 mLs (2.5 mg total) by nebulization every 6 (six) hours as needed for wheezing or shortness of breath. 150 mL 1   albuterol (VENTOLIN HFA) 108 (90 Base)  MCG/ACT inhaler Inhale 2 puffs into the lungs every 6 (six) hours as needed for wheezing or shortness of breath. (Patient taking differently: Inhale 2 puffs into the lungs every 6 (six) hours as needed for wheezing or shortness of breath.) 18 g 2   amLODipine (NORVASC) 10 MG tablet Take 1 tablet (10 mg total) by mouth daily. 90 tablet 3   atorvastatin (LIPITOR) 10 MG tablet Take 1 tablet (10 mg total) by mouth daily. 90 tablet 0   atorvastatin (LIPITOR) 40 MG tablet 1 tab(s)     azithromycin (ZITHROMAX) 250 MG tablet Take by mouth.     budesonide-formoterol (SYMBICORT) 160-4.5 MCG/ACT inhaler 2 puff(s)     celecoxib (CELEBREX) 200 MG capsule Take 1 capsule (200 mg total) by mouth daily. Stop Ibuprofen 30 capsule 1   cetirizine (ZYRTEC) 10 MG tablet 1 tab(s)     ciprofloxacin (CIPRO) 500 MG tablet Take 500 mg by mouth 2 (two) times daily.     cyclobenzaprine (FLEXERIL) 10 MG tablet Take 10 mg by mouth 3 (three) times daily.     Diclofenac Sodium 3 % GEL Apply 1 application topically 2 (two) times daily.     docusate sodium (COLACE) 100 MG capsule Take 1 capsule (100 mg total) by mouth daily as needed. 30 capsule 2   DULoxetine (CYMBALTA) 60 MG capsule TAKE ONE CAPSULE BY MOUTH ONCE DAILY (AM) 30 capsule 1   etodolac (LODINE) 500 MG tablet 1 tab(s)     EYSUVIS 0.25 % SUSP Apply 1 drop to eye 4 (four) times daily.     fluticasone (FLONASE) 50 MCG/ACT  nasal spray Place 2 sprays into both nostrils daily. (Patient taking differently: Place 2 sprays into both nostrils daily.) 16 g 2   hydrochlorothiazide (HYDRODIURIL) 12.5 MG tablet 1 tab(s)     HYDROcodone-acetaminophen (NORCO) 5-325 MG tablet Take 1-2 tablets by mouth daily as needed. 10 tablet 0   ibuprofen (ADVIL) 800 MG tablet Take 1 tablet (800 mg total) by mouth every 6 (six) hours as needed. Take in between doses of pain medication for additional relief or for mild pain 30 tablet 0   lisinopril-hydrochlorothiazide (ZESTORETIC) 20-25 MG tablet  Take 1 tablet by mouth daily. 90 tablet 1   losartan (COZAAR) 50 MG tablet 1 tab(s)     Multiple Vitamin (MULTIVITAMIN) tablet Take 1 tablet by mouth daily.     nortriptyline (PAMELOR) 25 MG capsule 1 cap(s)     omeprazole (PRILOSEC) 20 MG capsule Take 20 mg by mouth 2 (two) times daily.     pantoprazole (PROTONIX) 40 MG tablet Take 1 tablet (40 mg total) by mouth daily. 30 tablet 3   promethazine (PHENERGAN) 12.5 MG tablet Take 1 tablet (12.5 mg total) by mouth every 6 (six) hours as needed for nausea or vomiting. 30 tablet 0   promethazine (PHENERGAN) 25 MG tablet SMARTSIG:1-2 Tablet(s) By Mouth Every 6-8 Hours     RESTASIS 0.05 % ophthalmic emulsion 1 drop 2 (two) times daily.     Sodium Sulfate-Mag Sulfate-KCl (SUTAB) [phone] MG TABS Take 1 kit by mouth as directed. 24 tablet 0   Spacer/Aero-Holding Chambers (AEROCHAMBER MV) inhaler Use as instructed 1 each 0   SUMAtriptan (IMITREX) 100 MG tablet Take 1 tablet earliest onset of migraine.  May repeat in 2 hours if headache persists or recurs.  Maximum 2 tablets in 24 hours. (Patient taking differently: Take 100 mg by mouth every 2 (two) hours as needed. Take 1 tablet earliest onset of migraine.  May repeat in 2 hours if headache persists or recurs.  Maximum 2 tablets in 24 hours.) 10 tablet 3   SUMAtriptan (IMITREX) 25 MG tablet 1 tab(s)     tiZANidine (ZANAFLEX) 4 MG tablet 1 tab(s)     traMADol (ULTRAM) 50 MG tablet Take 1 tablet (50 mg total) by mouth 3 (three) times daily as needed. 30 tablet 0   triamcinolone cream (KENALOG) 0.5 % Apply 1 application topically 2 (two) times daily.     zolpidem (AMBIEN) 5 MG tablet TAKE ONE TABLET BY MOUTH DAILY AT BEDTIME. AS NEEDED FOR SLEEP 15 tablet 0   Current Facility-Administered Medications on File Prior to Visit  Medication Dose Route Frequency Provider Last Rate Last Admin   0.9 %  sodium chloride infusion  500 mL Intravenous Once Jackquline Denmark, MD        Allergies  Allergen Reactions    Diphenhydramine Hcl Itching   Metoprolol Palpitations    Objective: Physical Exam General: The patient is alert and oriented x3 in no acute distress.  Dermatology: Skin is warm, dry and supple bilateral lower extremities. Nails 1-10 are normal. There is no erythema, edema, no eccymosis, no open lesions present. Integument is otherwise unremarkable.  Old surgical scars are right well-healed.  Vascular: Dorsalis Pedis pulse and Posterior Tibial pulse are 2/4 bilateral. Capillary fill time is immediate to all digits.  Neurological: Grossly intact to light touch bilateral.  Subjective cramping to both feet and legs worse on the left.  Musculoskeletal: Minimal tenderness to palpation at the medial calcaneal tubercale and through the insertion of the plantar fascia  on the left foot. No pain with compression of calcaneus bilateral. No pain with tuning fork to calcaneus bilateral. No pain with calf compression bilateral. There is decreased Ankle joint range of motion bilateral. All other joints range of motion within normal limits bilateral. Strength 5/5 in all groups bilateral.    Xray, Left foot:  Normal osseous mineralization. Joint spaces preserved. No fracture/dislocation/boney destruction. Calcaneal spur present with mild thickening of plantar fascia. No other soft tissue abnormalities or radiopaque foreign bodies.   Assessment and Plan: Problem List Items Addressed This Visit   None Visit Diagnoses     Pain in left foot    -  Primary   Relevant Orders   DG Foot Complete Left   Plantar fasciitis           -Complete examination performed.  -Xrays reviewed -Discussed with patient in detail the condition of plantar fasciitis and cramping on the left, how this occurs and general treatment options. Explained both conservative and surgical treatments.  -Patient declined injection at this time -Rx prednisone Dosepak -Recommended good supportive shoes  -Recommend daily stretching daily  stretching exercises. -Recommend patient to resume magnesium or to take cramping ease supplement and to stop drinking alcohol because this could make her symptoms worse -Patient to return to office as needed for follow up or sooner if problems or questions arise.  Landis Martins, DPM

## 2021-02-25 NOTE — Patient Instructions (Signed)
Over-the-counter cramp ease supplement

## 2021-03-05 ENCOUNTER — Encounter (HOSPITAL_BASED_OUTPATIENT_CLINIC_OR_DEPARTMENT_OTHER): Payer: Medicaid Other | Admitting: Internal Medicine

## 2021-05-05 ENCOUNTER — Other Ambulatory Visit: Payer: Self-pay | Admitting: Internal Medicine

## 2021-05-05 ENCOUNTER — Other Ambulatory Visit: Payer: Self-pay | Admitting: Physician Assistant

## 2021-05-05 DIAGNOSIS — F331 Major depressive disorder, recurrent, moderate: Secondary | ICD-10-CM

## 2021-05-05 NOTE — Telephone Encounter (Signed)
Requested medication (s) are due for refill today - no  Requested medication (s) are on the active medication list -yes  Future visit scheduled -no  Last refill: 11/05/20 #30 3RF  Notes to clinic: Request RF: expired Rx- listed as duplicate therapy   Requested Prescriptions  Pending Prescriptions Disp Refills   pantoprazole (PROTONIX) 40 MG tablet [Pharmacy Med Name: PANTOPRAZOLE SODIUM 40 MG ORAL TABLET DELAYED RELEASE] 30 tablet 3    Sig: TAKE ONE TABLET BY MOUTH ONCE DAILY (AM)     Gastroenterology: Proton Pump Inhibitors Passed - 05/05/2021 12:47 PM      Passed - Valid encounter within last 12 months    Recent Outpatient Visits           6 months ago Essential hypertension   [facility] Marcine Matar, MD   10 months ago Essential hypertension   [facility] Swords, Valetta Mole, MD   11 months ago Chronic pain of right knee   [facility] Grayce Sessions, NP   1 year ago Moderate persistent asthma with acute exacerbation   [facility] Jonah Blue B, MD   1 year ago Moderate episode of recurrent major depressive disorder [facility])   [facility] Marcine Matar, MD                 Requested Prescriptions  Pending Prescriptions Disp Refills   pantoprazole (PROTONIX) 40 MG tablet [Pharmacy Med Name: PANTOPRAZOLE SODIUM 40 MG ORAL TABLET DELAYED RELEASE] 30 tablet 3    Sig: TAKE ONE TABLET BY MOUTH ONCE DAILY (AM)     Gastroenterology: Proton Pump Inhibitors Passed - 05/05/2021 12:47 PM      Passed - Valid encounter within last 12 months    Recent Outpatient Visits           6 months ago Essential hypertension   [facility] Marcine Matar, MD   10 months ago Essential hypertension   [facility] Swords, Valetta Mole, MD   11 months ago Chronic pain of right knee    [facility] Grayce Sessions, NP   1 year ago Moderate persistent asthma with acute exacerbation   [facility] Marcine Matar, MD   1 year ago Moderate episode of recurrent major depressive disorder [facility])   [facility] Marcine Matar, MD

## 2021-05-13 ENCOUNTER — Encounter (HOSPITAL_BASED_OUTPATIENT_CLINIC_OR_DEPARTMENT_OTHER): Payer: Medicaid Other | Admitting: Internal Medicine

## 2021-06-03 ENCOUNTER — Ambulatory Visit (INDEPENDENT_AMBULATORY_CARE_PROVIDER_SITE_OTHER): Payer: Medicaid Other | Admitting: Sports Medicine

## 2021-06-03 ENCOUNTER — Encounter: Payer: Self-pay | Admitting: Sports Medicine

## 2021-06-03 ENCOUNTER — Ambulatory Visit (INDEPENDENT_AMBULATORY_CARE_PROVIDER_SITE_OTHER): Payer: Medicaid Other

## 2021-06-03 ENCOUNTER — Other Ambulatory Visit: Payer: Self-pay

## 2021-06-03 DIAGNOSIS — M779 Enthesopathy, unspecified: Secondary | ICD-10-CM

## 2021-06-03 DIAGNOSIS — M722 Plantar fascial fibromatosis: Secondary | ICD-10-CM | POA: Diagnosis not present

## 2021-06-03 DIAGNOSIS — M79671 Pain in right foot: Secondary | ICD-10-CM | POA: Diagnosis not present

## 2021-06-03 MED ORDER — PREDNISONE 10 MG (21) PO TBPK: ORAL_TABLET | ORAL | 0 refills | Status: DC

## 2021-06-03 NOTE — Progress Notes (Signed)
Subjective: [name] [name] is a 55 y.o. female patient who presents to office for evaluation of right foot pain. Patient complains of progressive pain especially over the last several days to 1 week states that the pain is really bad has tried her compression sleeve pain icing elevating but nothing has helped.  Patient denies any known injury or trauma to the top of her right foot.  Admits that she may have to have back surgery.  Patient Active Problem List   Diagnosis Date Noted   Essential hypertension 02/25/2021   Migraine with aura 02/25/2021   Mild intermittent asthma 02/25/2021   Mixed hyperlipidemia 02/25/2021   Osteoarthritis of knee 02/25/2021   Other intervertebral disc degeneration, lumbar region 02/25/2021   Vitamin D deficiency 02/25/2021   Esophageal dysphagia 11/05/2020   Odontogenic infection of jaw 06/10/2020   OSA (obstructive sleep apnea) 04/01/2020   Moderate persistent asthma with acute exacerbation 12/30/2019   Chronic coughing 12/30/2019   Seasonal allergies 12/30/2019   Loud snoring 12/16/2019   Prediabetes 01/04/2019   Obesity (BMI 30-39.9) 01/04/2019   Ganglion cyst 09/28/2018   Carpal tunnel syndrome of right wrist 09/28/2018   Chronic midline low back pain without sciatica 11/23/2017   Elevated blood pressure reading with diagnosis of hypertension 11/23/2017   Chronic pain of right knee 11/23/2017   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 30 tablet 0   albuterol (PROVENTIL) (2.5 MG/3ML) 0.083% nebulizer solution Take 3 mLs (2.5 mg total)  by nebulization every 6 (six) hours as needed for wheezing or shortness of breath. 150 mL 1   albuterol (VENTOLIN HFA) 108 (90 Base) MCG/ACT inhaler Inhale 2 puffs into the lungs every 6 (six) hours as needed for wheezing or shortness of breath. (Patient taking differently: Inhale 2 puffs into the lungs every 6 (six) hours as needed for wheezing or shortness of breath.) 18 g 2   amLODipine (NORVASC) 10 MG tablet Take 1 tablet (10 mg total) by mouth daily. 90 tablet 3   atorvastatin (LIPITOR) 10 MG tablet Take 1 tablet (10 mg total) by mouth daily. 90 tablet 0   atorvastatin (LIPITOR) 40 MG tablet 1 tab(s)     azithromycin (ZITHROMAX) 250 MG tablet Take by mouth.     budesonide-formoterol (SYMBICORT) 160-4.5 MCG/ACT inhaler 2 puff(s)     celecoxib (CELEBREX) 200 MG capsule Take 1 capsule (200 mg total) by mouth daily. Stop Ibuprofen 30 capsule 1   cetirizine (ZYRTEC) 10 MG tablet 1 tab(s)     ciprofloxacin (CIPRO) 500 MG tablet Take 500 mg by mouth 2 (two) times daily.     cyclobenzaprine (FLEXERIL) 10 MG tablet Take 10 mg by mouth 3 (three) times daily.     Diclofenac Sodium 3 % GEL Apply 1 application topically 2 (two) times daily.     docusate sodium (COLACE) 100 MG capsule Take 1 capsule (100 mg total) by mouth daily as needed. 30 capsule 2   DULoxetine (CYMBALTA) 60 MG capsule TAKE ONE CAPSULE BY MOUTH ONCE DAILY (AM) 30 capsule 0   etodolac (LODINE) 500 MG  tablet 1 tab(s)     EYSUVIS 0.25 % SUSP Apply 1 drop to eye 4 (four) times daily.     fluticasone (FLONASE) 50 MCG/ACT nasal spray Place 2 sprays into both nostrils daily. (Patient taking differently: Place 2 sprays into both nostrils daily.) 16 g 2   hydrochlorothiazide (HYDRODIURIL) 12.5 MG tablet 1 tab(s)     HYDROcodone-acetaminophen (NORCO) 5-325 MG tablet Take 1-2 tablets by mouth daily as needed. 10 tablet 0   ibuprofen (ADVIL) 800 MG tablet Take 1 tablet (800 mg total) by mouth every 6 (six) hours as needed. Take in between doses of  pain medication for additional relief or for mild pain 30 tablet 0   lisinopril-hydrochlorothiazide (ZESTORETIC) 20-25 MG tablet Take 1 tablet by mouth daily. 90 tablet 1   losartan (COZAAR) 50 MG tablet 1 tab(s)     Multiple Vitamin (MULTIVITAMIN) tablet Take 1 tablet by mouth daily.     nortriptyline (PAMELOR) 25 MG capsule 1 cap(s)     omeprazole (PRILOSEC) 20 MG capsule Take 20 mg by mouth 2 (two) times daily.     pantoprazole (PROTONIX) 40 MG tablet Take 1 tablet (40 mg total) by mouth daily. 30 tablet 3   promethazine (PHENERGAN) 12.5 MG tablet Take 1 tablet (12.5 mg total) by mouth every 6 (six) hours as needed for nausea or vomiting. 30 tablet 0   promethazine (PHENERGAN) 25 MG tablet SMARTSIG:1-2 Tablet(s) By Mouth Every 6-8 Hours     RESTASIS 0.05 % ophthalmic emulsion 1 drop 2 (two) times daily.     Sodium Sulfate-Mag Sulfate-KCl (SUTAB) [phone] MG TABS Take 1 kit by mouth as directed. 24 tablet 0   Spacer/Aero-Holding Chambers (AEROCHAMBER MV) inhaler Use as instructed 1 each 0   SUMAtriptan (IMITREX) 100 MG tablet Take 1 tablet earliest onset of migraine.  May repeat in 2 hours if headache persists or recurs.  Maximum 2 tablets in 24 hours. (Patient taking differently: Take 100 mg by mouth every 2 (two) hours as needed. Take 1 tablet earliest onset of migraine.  May repeat in 2 hours if headache persists or recurs.  Maximum 2 tablets in 24 hours.) 10 tablet 3   SUMAtriptan (IMITREX) 25 MG tablet 1 tab(s)     tiZANidine (ZANAFLEX) 4 MG tablet 1 tab(s)     traMADol (ULTRAM) 50 MG tablet Take 1 tablet (50 mg total) by mouth 3 (three) times daily as needed. 30 tablet 0   triamcinolone cream (KENALOG) 0.5 % Apply 1 application topically 2 (two) times daily.     zolpidem (AMBIEN) 5 MG tablet TAKE ONE TABLET BY MOUTH DAILY AT BEDTIME. AS NEEDED FOR SLEEP 15 tablet 0   Current Facility-Administered Medications on File Prior to Visit  Medication Dose Route Frequency Provider Last Rate  Last Admin   0.9 %  sodium chloride infusion  500 mL Intravenous Once Jackquline Denmark, MD        Allergies  Allergen Reactions   Diphenhydramine Hcl Itching   Metoprolol Palpitations    Objective:  General: Alert and oriented x3 in no acute distress  Dermatology: No open lesions bilateral lower extremities, no webspace macerations, no ecchymosis bilateral, all nails x 10 are well manicured.  Old surgical scars well-healed.  Vascular: Dorsalis Pedis and Posterior Tibial pedal pulses palpable, Capillary Fill Time 3 seconds,(+) pedal hair growth bilateral, no edema bilateral lower extremities, Temperature gradient within normal limits.  Neurology: Gross sensation intact via light touch bilateral.  Musculoskeletal: Mild to moderate tenderness with palpation  at right dorsal midfoot along the extensor tendon course.  Gait: Antalgic gait  Xrays  Right foot   Impression: No acute osseous findings  Assessment and Plan: Problem List Items Addressed This Visit   None Visit Diagnoses     Right foot pain    -  Primary   Relevant Orders   DG Foot Complete Right   Capsulitis       Tendonitis            -Complete examination performed -Xrays reviewed -Discussed treatment options for likely tendinitis versus midfoot capsulitis -Rx prednisone Dosepak for patient to take as directed -Advised patient to continue with compression sleeve for supportive brace as dispensed this visit at no charge -Recommend rest ice elevation daily until symptoms improve -Patient to continue with smoking cessation and is celebrating being sober from alcohol -Patient to return to office as scheduled in 3 to 4 weeks or sooner if condition worsens.  Landis Martins, DPM

## 2021-06-07 ENCOUNTER — Other Ambulatory Visit: Payer: Self-pay | Admitting: Sports Medicine

## 2021-06-07 DIAGNOSIS — M722 Plantar fascial fibromatosis: Secondary | ICD-10-CM

## 2021-06-21 ENCOUNTER — Other Ambulatory Visit: Payer: Self-pay | Admitting: Neurosurgery

## 2021-06-24 ENCOUNTER — Other Ambulatory Visit: Payer: Self-pay

## 2021-06-24 ENCOUNTER — Ambulatory Visit (HOSPITAL_BASED_OUTPATIENT_CLINIC_OR_DEPARTMENT_OTHER): Payer: Medicaid Other | Attending: Internal Medicine | Admitting: Internal Medicine

## 2021-06-24 DIAGNOSIS — R0683 Snoring: Secondary | ICD-10-CM | POA: Insufficient documentation

## 2021-06-24 DIAGNOSIS — F32A Depression, unspecified: Secondary | ICD-10-CM | POA: Insufficient documentation

## 2021-06-24 DIAGNOSIS — G4733 Obstructive sleep apnea (adult) (pediatric): Secondary | ICD-10-CM | POA: Insufficient documentation

## 2021-06-24 DIAGNOSIS — I10 Essential (primary) hypertension: Secondary | ICD-10-CM | POA: Insufficient documentation

## 2021-06-24 DIAGNOSIS — K219 Gastro-esophageal reflux disease without esophagitis: Secondary | ICD-10-CM | POA: Insufficient documentation

## 2021-06-24 DIAGNOSIS — G4761 Periodic limb movement disorder: Secondary | ICD-10-CM | POA: Diagnosis not present

## 2021-06-24 DIAGNOSIS — G4700 Insomnia, unspecified: Secondary | ICD-10-CM | POA: Insufficient documentation

## 2021-06-24 DIAGNOSIS — E669 Obesity, unspecified: Secondary | ICD-10-CM | POA: Diagnosis not present

## 2021-06-25 ENCOUNTER — Telehealth: Payer: Self-pay | Admitting: Internal Medicine

## 2021-06-25 DIAGNOSIS — G4733 Obstructive sleep apnea (adult) (pediatric): Secondary | ICD-10-CM

## 2021-06-25 NOTE — Telephone Encounter (Signed)
Returned pt call pt didn't answer lvm  

## 2021-06-25 NOTE — Telephone Encounter (Signed)
Spoke to patient and she will come by on Monday

## 2021-06-25 NOTE — Telephone Encounter (Signed)
Copied from CRM [id number]. Topic: General - Other >> Jun 25, 2021 11:56 AM Marylen Ponto wrote: Reason for CRM: Pt requests call back from [facility]. Cb# [phone]  Spoke with Patient she has called back requesting to speak with you

## 2021-06-25 NOTE — Telephone Encounter (Signed)
Pt will need to come in and sign a medical release in order to fax over sleep study results

## 2021-06-25 NOTE — Telephone Encounter (Signed)
Copied from CRM [id number]. Topic: General - Other >> Jun 25, 2021  9:56 AM Traci Sermon wrote: Reason for CRM: Pt called in stating if PCP could send over her last sleep study over to Gwynneth Macleod, PA-C medical office in [facility], there contact number is [phone], please advise.

## 2021-06-27 DIAGNOSIS — G4733 Obstructive sleep apnea (adult) (pediatric): Secondary | ICD-10-CM | POA: Diagnosis not present

## 2021-06-27 NOTE — Procedures (Signed)
Patient Name: [name] [name], [name] [name] Date: 06/24/2021 Gender: Female D.O.B: Mar 12, 1966 Age (years): 67 Referring Provider: Jonah Blue MD Height (inches): 64 Interpreting Physician: Jetty Duhamel MD, ABSM Weight (lbs): 195 RPSGT: Lowry Ram BMI: 33 MRN: [id number] Neck Size: 15.00  CLINICAL INFORMATION The patient is referred for a CPAP titration to treat sleep apnea.  Date of NPSG, Split Night or HST: NPSG 01/22/20 - AHI 12.4/ hr, desaturation to 83%,  body weight 195 lbs  SLEEP STUDY TECHNIQUE As per the AASM Manual for the Scoring of Sleep and Associated Events v2.3 (April 2016) with a hypopnea requiring 4% desaturations.  The channels recorded and monitored were frontal, central and occipital EEG, electrooculogram (EOG), submentalis EMG (chin), nasal and oral airflow, thoracic and abdominal wall motion, anterior tibialis EMG, snore microphone, electrocardiogram, and pulse oximetry. Continuous positive airway pressure (CPAP) was initiated at the beginning of the study and titrated to treat sleep-disordered breathing.  MEDICATIONS Medications self-administered by patient taken the night of the study : ZOLPIDEM TARTRATE  TECHNICIAN COMMENTS Comments added by technician: Patient had difficulty initiating sleep. Patient was restless all through the night. Comments added by scorer: N/A  RESPIRATORY PARAMETERS Optimal PAP Pressure (cm): 15 AHI at Optimal Pressure (/hr): 1.4 Overall Minimal O2 (%): 90.0 Supine % at Optimal Pressure (%): 82 Minimal O2 at Optimal Pressure (%): 94.0   SLEEP ARCHITECTURE The study was initiated at 10:51:12 PM and ended at 6:00:50 AM.  Sleep onset time was 63.9 minutes and the sleep efficiency was 77.1%%. The total sleep time was 331.2 minutes.  The patient spent 6.6%% of the night in stage N1 sleep, 75.5%% in stage N2 sleep, 0.0%% in stage N3 and 17.8% in REM.Stage REM latency was 126.0 minutes  Wake after sleep onset was 34.5. Alpha  intrusion was absent. Supine sleep was 44.34%.  CARDIAC DATA The 2 lead EKG demonstrated sinus rhythm. The mean heart rate was 73.1 beats per minute. Other EKG findings include: None.  LEG MOVEMENT DATA The total Periodic Limb Movements of Sleep (PLMS) were 0. The PLMS index was 0.0. A PLMS index of <15 is considered normal in adults.  IMPRESSIONS - The optimal PAP pressure was 15 cm of water. - Central sleep apnea was not noted during this titration (CAI = 0.9/h). - Significant oxygen desaturations were not observed during this titration (min O2 = 90.0%). - The patient snored with loud snoring volume during this titration study. - No cardiac abnormalities were observed during this study. - Total limb movements 313( 56.7/ hr). Limb movements with arousal 59( 10.7/ hr).  DIAGNOSIS - Obstructive Sleep Apnea (G47.33) - Periodic Limb Movement During Sleep (G47.61)  RECOMMENDATIONS - Trial of CPAP therapy on 15 cm H2O or autopap 10-20. - Patient used a Small Wide size Resmed Full Face Mask AirFit F30i mask and heated humidification. - Consider trial of Requip, Mirapex or Sinemet for Periodic Limb Movement if persistent. - Be careful with alcohol, sedatives and other CNS depressants that may worsen sleep apnea and disrupt normal sleep architecture. - Sleep hygiene should be reviewed to assess factors that may improve sleep quality. - Weight management and regular exercise should be initiated or continued.  [Electronically signed] 06/27/2021 11:17 AM  Jetty Duhamel MD, ABSM Diplomate, American Board of Sleep Medicine   NPI: [id number]                          Jetty Duhamel Diplomate, American Board of Sleep Medicine  ELECTRONICALLY  SIGNED ON:  06/27/2021, 11:10 AM [facility] PH: [phone]   FX: [phone] [place]

## 2021-06-28 NOTE — Telephone Encounter (Signed)
Pt is still waiting for a cb at [phone]. Wants resultsof sleep study states asap

## 2021-06-28 NOTE — Telephone Encounter (Signed)
Patient called back to speak to [place] asking for a call back this afternoon please to discuss her sleep study results so she can move on to getting her Cpap machine.Ph#   [phone]

## 2021-06-30 NOTE — Telephone Encounter (Signed)
Contacted pt to go over provider message and made aware that rx was sent yesterday to [facility]. Pt states she doesn't want to stat a medication without speaking to her new pcp.

## 2021-07-05 MED ORDER — ROPINIROLE HCL 0.25 MG PO TABS: 0.2500 mg | ORAL_TABLET | Freq: Every day | ORAL | 2 refills | Status: DC

## 2021-07-05 NOTE — Telephone Encounter (Signed)
PC placed to pt today to discuss sleep study titration results.  Autopap/CPAP already ordered.  Pt states that the medical supplier has already called her but she has not called back as yet.  States she would like to get her study done before she has her back surgery that is scheduled for December 12th. I told her that she will ned to call the supplier back. I also discussed with her the periodic limb movement seen during her study.  Discussed how this can also interfere with getting a good nights rest.  Pt states it has been going on for a long time because her significant other told her so.  She is willing to try Requip.  I went over possible S.E of the med including compulsive behaviors including hypersex, gambling, compulsive eating etc.  Advise to let me know if she notices any changes in behavior like this.  Rxn sent to her pharmacy for requip 0.25 mg

## 2021-07-06 ENCOUNTER — Other Ambulatory Visit: Payer: Self-pay | Admitting: Neurosurgery

## 2021-07-08 ENCOUNTER — Ambulatory Visit: Payer: Medicaid Other | Admitting: Sports Medicine

## 2021-07-08 ENCOUNTER — Other Ambulatory Visit: Payer: Self-pay

## 2021-07-08 DIAGNOSIS — M779 Enthesopathy, unspecified: Secondary | ICD-10-CM | POA: Diagnosis not present

## 2021-07-08 DIAGNOSIS — M79671 Pain in right foot: Secondary | ICD-10-CM

## 2021-07-08 MED ORDER — LIDOCAINE 5 % EX PTCH: 1.0000 | MEDICATED_PATCH | CUTANEOUS | 0 refills | Status: DC

## 2021-07-08 NOTE — Progress Notes (Signed)
Subjective: [name] [name] is a 55 y.o. female patient who presents to office for follow-up evaluation of right foot pain.  Patient reports that her pain is not getting any better sometimes the pain can be very unbearable states that the prednisone did not help and has been using brace.  Denies any other pedal complaints at this time.  Admits back surgery is scheduled for December 12.  Patient Active Problem List   Diagnosis Date Noted   Essential hypertension 02/25/2021   Migraine with aura 02/25/2021   Mild intermittent asthma 02/25/2021   Mixed hyperlipidemia 02/25/2021   Osteoarthritis of knee 02/25/2021   Other intervertebral disc degeneration, lumbar region 02/25/2021   Vitamin D deficiency 02/25/2021   Esophageal dysphagia 11/05/2020   Odontogenic infection of jaw 06/10/2020   OSA (obstructive sleep apnea) 04/01/2020   Moderate persistent asthma with acute exacerbation 12/30/2019   Chronic coughing 12/30/2019   Seasonal allergies 12/30/2019   Loud snoring 12/16/2019   Prediabetes 01/04/2019   Obesity (BMI 30-39.9) 01/04/2019   Ganglion cyst 09/28/2018   Carpal tunnel syndrome of right wrist 09/28/2018   Chronic midline low back pain without sciatica 11/23/2017   Elevated blood pressure reading with diagnosis of hypertension 11/23/2017   Chronic pain of right knee 11/23/2017   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 30 tablet 0   albuterol (PROVENTIL) (2.5 MG/3ML) 0.083% nebulizer solution Take 3 mLs (2.5 mg total) by nebulization every 6 (six) hours as  needed for wheezing or shortness of breath. 150 mL 1   albuterol (VENTOLIN HFA) 108 (90 Base) MCG/ACT inhaler Inhale 2 puffs into the lungs every 6 (six) hours as needed for wheezing or shortness of breath. (Patient taking differently: Inhale 2 puffs into the lungs every 6 (six) hours as needed for wheezing or shortness of breath.) 18 g 2   amLODipine (NORVASC) 10 MG tablet Take 1 tablet (10 mg total) by mouth daily. 90 tablet 3   atorvastatin (LIPITOR) 10 MG tablet Take 1 tablet (10 mg total) by mouth daily. 90 tablet 0   atorvastatin (LIPITOR) 40 MG tablet 1 tab(s)     azithromycin (ZITHROMAX) 250 MG tablet Take by mouth.     budesonide-formoterol (SYMBICORT) 160-4.5 MCG/ACT inhaler 2 puff(s)     celecoxib (CELEBREX) 200 MG capsule Take 1 capsule (200 mg total) by mouth daily. Stop Ibuprofen 30 capsule 1   cetirizine (ZYRTEC) 10 MG tablet 1 tab(s)     ciprofloxacin (CIPRO) 500 MG tablet Take 500 mg by mouth 2 (two) times daily.     cyclobenzaprine (FLEXERIL) 10 MG tablet Take 10 mg by mouth 3 (three) times daily.     Diclofenac Sodium 3 % GEL Apply 1 application topically 2 (two) times daily.     DULoxetine (CYMBALTA) 60 MG capsule TAKE ONE CAPSULE BY MOUTH ONCE DAILY (AM) 30 capsule 0   etodolac (LODINE) 500 MG tablet 1 tab(s)     EYSUVIS 0.25 % SUSP Apply 1 drop to eye 4 (four) times daily.     fluticasone (FLONASE) 50 MCG/ACT nasal spray Place  2 sprays into both nostrils daily. (Patient taking differently: Place 2 sprays into both nostrils daily.) 16 g 2   hydrochlorothiazide (HYDRODIURIL) 12.5 MG tablet 1 tab(s)     HYDROcodone-acetaminophen (NORCO) 5-325 MG tablet Take 1-2 tablets by mouth daily as needed. 10 tablet 0   ibuprofen (ADVIL) 800 MG tablet Take 1 tablet (800 mg total) by mouth every 6 (six) hours as needed. Take in between doses of pain medication for additional relief or for mild pain 30 tablet 0   lisinopril-hydrochlorothiazide (ZESTORETIC) 20-25 MG tablet Take 1 tablet by  mouth daily. 90 tablet 1   losartan (COZAAR) 50 MG tablet 1 tab(s)     Multiple Vitamin (MULTIVITAMIN) tablet Take 1 tablet by mouth daily.     nortriptyline (PAMELOR) 25 MG capsule 1 cap(s)     omeprazole (PRILOSEC) 20 MG capsule Take 20 mg by mouth 2 (two) times daily.     pantoprazole (PROTONIX) 40 MG tablet Take 1 tablet (40 mg total) by mouth daily. 30 tablet 3   predniSONE (STERAPRED UNI-PAK 21 TAB) 10 MG (21) TBPK tablet Take as directed 21 tablet 0   promethazine (PHENERGAN) 12.5 MG tablet Take 1 tablet (12.5 mg total) by mouth every 6 (six) hours as needed for nausea or vomiting. 30 tablet 0   promethazine (PHENERGAN) 25 MG tablet SMARTSIG:1-2 Tablet(s) By Mouth Every 6-8 Hours     RESTASIS 0.05 % ophthalmic emulsion 1 drop 2 (two) times daily.     rOPINIRole (REQUIP) 0.25 MG tablet Take 1 tablet (0.25 mg total) by mouth at bedtime. 30 tablet 2   Sodium Sulfate-Mag Sulfate-KCl (SUTAB) [phone] MG TABS Take 1 kit by mouth as directed. 24 tablet 0   Spacer/Aero-Holding Chambers (AEROCHAMBER MV) inhaler Use as instructed 1 each 0   SUMAtriptan (IMITREX) 100 MG tablet Take 1 tablet earliest onset of migraine.  May repeat in 2 hours if headache persists or recurs.  Maximum 2 tablets in 24 hours. (Patient taking differently: Take 100 mg by mouth every 2 (two) hours as needed. Take 1 tablet earliest onset of migraine.  May repeat in 2 hours if headache persists or recurs.  Maximum 2 tablets in 24 hours.) 10 tablet 3   SUMAtriptan (IMITREX) 25 MG tablet 1 tab(s)     tiZANidine (ZANAFLEX) 4 MG tablet 1 tab(s)     traMADol (ULTRAM) 50 MG tablet Take 1 tablet (50 mg total) by mouth 3 (three) times daily as needed. 30 tablet 0   triamcinolone cream (KENALOG) 0.5 % Apply 1 application topically 2 (two) times daily.     zolpidem (AMBIEN) 5 MG tablet TAKE ONE TABLET BY MOUTH DAILY AT BEDTIME. AS NEEDED FOR SLEEP 15 tablet 0   Current Facility-Administered Medications on File Prior to Visit   Medication Dose Route Frequency Provider Last Rate Last Admin   0.9 %  sodium chloride infusion  500 mL Intravenous Once Jackquline Denmark, MD        Allergies  Allergen Reactions   Diphenhydramine Hcl Itching   Metoprolol Palpitations    Objective:  General: Alert and oriented x3 in no acute distress  Dermatology: No open lesions bilateral lower extremities, no webspace macerations, no ecchymosis bilateral, all nails x 10 are well manicured.  Old surgical scars well-healed.  Vascular: Dorsalis Pedis and Posterior Tibial pedal pulses palpable, Capillary Fill Time 3 seconds,(+) pedal hair growth bilateral, no edema bilateral lower extremities, Temperature gradient within normal limits.  Neurology: Gross sensation intact via light touch bilateral.  Musculoskeletal: Mild to moderate tenderness with palpation at right dorsal midfoot along the extensor tendon course, unchanged from prior.  Assessment and Plan: Problem List Items Addressed This Visit   None Visit Diagnoses     Right foot pain    -  Primary   Capsulitis       Tendonitis            -Complete examination performed -Previous x-rays reviewed which remain negative -Discussed with patient continued pain across the midfoot -Prescribed topical lidocaine patch for patient to use as directed -Dispensed compression anklet for patient to use as directed -Recommend rest ice elevation daily until symptoms improve -Patient advised if symptoms fail to improve would benefit from MRI however we will hold off on getting this done until after her back surgery on 12/12 -Patient to return to office as scheduled PRN or sooner if condition worsens.  Landis Martins, DPM

## 2021-07-12 NOTE — Pre-Procedure Instructions (Signed)
Surgical Instructions    Your procedure is scheduled on Monday, December 12th.  Report to [facility] Main Entrance "A" at 05:30 A.M., then check in with the Admitting office.  Call this number if you have problems the morning of surgery:  [phone]   If you have any questions prior to your surgery date call [phone]: Open Monday-Friday 8am-4pm    Remember:  Do not eat or drink after midnight the night before your surgery      Take these medicines the morning of surgery with A SIP OF WATER  amLODipine (NORVASC)  atorvastatin (LIPITOR) cetirizine (ZYRTEC)  cyclobenzaprine (FLEXERIL) DULoxetine (CYMBALTA) fluticasone-salmeterol (ADVAIR)  RESTASIS 0.05 % ophthalmic emulsion    If needed: acetaminophen (TYLENOL)  albuterol (PROVENTIL) (2.5 MG/3ML) 0.083% nebulizer solution albuterol (VENTOLIN HFA) 108 (90 Base) MCG/ACT inhaler- bring with you on day of surgery fluticasone (FLONASE)  HYDROcodone-acetaminophen (NORCO) omeprazole (PRILOSEC) pantoprazole (PROTONIX) SUMAtriptan (IMITREX)  traMADol (ULTRAM)     As of today, STOP taking any Aspirin (unless otherwise instructed by your surgeon) Aleve, Naproxen, Ibuprofen, Motrin, Advil, Goody's, BC's, all herbal medications, fish oil, and all vitamins. This includes Diclofenac Sodium 3 % GEL.                     Do NOT Smoke (Tobacco/Vaping) or drink Alcohol 24 hours prior to your procedure.  If you use a CPAP at night, you may bring all equipment for your overnight stay.   Contacts, glasses, piercing's, hearing aid's, dentures or partials may not be worn into surgery, please bring cases for these belongings.    For patients admitted to the hospital, discharge time will be determined by your treatment team.   Patients discharged the day of surgery will not be allowed to drive home, and someone needs to stay with them for 24 hours.  NO VISITORS WILL BE ALLOWED IN PRE-OP WHERE PATIENTS GET READY FOR SURGERY.  ONLY 1  SUPPORT PERSON MAY BE PRESENT IN THE WAITING ROOM WHILE YOU ARE IN SURGERY.  IF YOU ARE TO BE ADMITTED, ONCE YOU ARE IN YOUR ROOM YOU WILL BE ALLOWED TWO (2) VISITORS.  Minor children may have two parents present. Special consideration for safety and communication needs will be reviewed on a case by case basis.   Special instructions:   [place]- Preparing For Surgery  Before surgery, you can play an important role. Because skin is not sterile, your skin needs to be as free of germs as possible. You can reduce the number of germs on your skin by washing with CHG (chlorahexidine gluconate) Soap before surgery.  CHG is an antiseptic cleaner which kills germs and bonds with the skin to continue killing germs even after washing.    Oral Hygiene is also important to reduce your risk of infection.  Remember - BRUSH YOUR TEETH THE MORNING OF SURGERY WITH YOUR REGULAR TOOTHPASTE  Please do not use if you have an allergy to CHG or antibacterial soaps. If your skin becomes reddened/irritated stop using the CHG.  Do not shave (including legs and underarms) for at least 48 hours prior to first CHG shower. It is OK to shave your face.  Please follow these instructions carefully.   Shower the NIGHT BEFORE SURGERY and the MORNING OF SURGERY  If you chose to wash your hair, wash your hair first as usual with your normal shampoo.  After you shampoo, rinse your hair and body thoroughly to remove the shampoo.  Use CHG Soap as you  would any other liquid soap. You can apply CHG directly to the skin and wash gently with a scrungie or a clean washcloth.   Apply the CHG Soap to your body ONLY FROM THE NECK DOWN.  Do not use on open wounds or open sores. Avoid contact with your eyes, ears, mouth and genitals (private parts). Wash Face and genitals (private parts)  with your normal soap.   Wash thoroughly, paying special attention to the area where your surgery will be performed.  Thoroughly rinse your body with  warm water from the neck down.  DO NOT shower/wash with your normal soap after using and rinsing off the CHG Soap.  Pat yourself dry with a CLEAN TOWEL.  Wear CLEAN PAJAMAS to bed the night before surgery  Place CLEAN SHEETS on your bed the night before your surgery  DO NOT SLEEP WITH PETS.   Day of Surgery: Shower with CHG soap. Do not wear jewelry, make up, nail polish, gel polish, artificial nails, or any other type of covering on natural nails including finger and toenails. If patients have artificial nails, gel coating, etc. that need to be removed by a nail salon please have this removed prior to surgery. Surgery may need to be canceled/delayed if the surgeon/ anesthesia feels like the patient is unable to be adequately monitored. Do not wear lotions, powders, perfumes, or deodorant. Do not shave 48 hours prior to surgery.   Do not bring valuables to the hospital. [facility] is not responsible for any belongings or valuables. Wear Clean/Comfortable clothing the morning of surgery Remember to brush your teeth WITH YOUR REGULAR TOOTHPASTE.   Please read over the following fact sheets that you were given.   3 days prior to your procedure or After your COVID test   You are not required to quarantine however you are required to wear a well-fitting mask when you are out and around people not in your household. If your mask becomes wet or soiled, replace with a new one.   Wash your hands often with soap and water for 20 seconds or clean your hands with an alcohol-based hand sanitizer that contains at least 60% alcohol.   Do not share personal items.   Notify your provider:  o if you are in close contact with someone who has COVID  o or if you develop a fever of 100.4 or greater, sneezing, cough, sore throat, shortness of breath or body aches.

## 2021-07-13 ENCOUNTER — Encounter (HOSPITAL_COMMUNITY): Payer: Self-pay

## 2021-07-13 ENCOUNTER — Encounter (HOSPITAL_COMMUNITY)
Admission: RE | Admit: 2021-07-13 | Discharge: 2021-07-13 | Disposition: A | Discharge status: Home / Self Care | Payer: Medicaid Other | Source: Ambulatory Visit | Attending: Neurosurgery | Admitting: Neurosurgery

## 2021-07-13 ENCOUNTER — Other Ambulatory Visit: Payer: Self-pay

## 2021-07-13 VITALS — BP 121/90 | HR 83 | Temp 98.5°F | Resp 17 | Ht 64.0 in | Wt 185.0 lb

## 2021-07-13 DIAGNOSIS — F1010 Alcohol abuse, uncomplicated: Secondary | ICD-10-CM | POA: Diagnosis not present

## 2021-07-13 DIAGNOSIS — R7303 Prediabetes: Secondary | ICD-10-CM | POA: Diagnosis not present

## 2021-07-13 DIAGNOSIS — Z01818 Encounter for other preprocedural examination: Secondary | ICD-10-CM | POA: Insufficient documentation

## 2021-07-13 LAB — COMPREHENSIVE METABOLIC PANEL
ALT: 17 U/L (ref 0–44)
AST: 18 U/L (ref 15–41)
Albumin: 3.5 g/dL (ref 3.5–5.0)
Alkaline Phosphatase: 54 U/L (ref 38–126)
Anion gap: 8 (ref 5–15)
BUN: 14 mg/dL (ref 6–20)
CO2: 27 mmol/L (ref 22–32)
Calcium: 9 mg/dL (ref 8.9–10.3)
Chloride: 102 mmol/L (ref 98–111)
Creatinine, Ser: 0.79 mg/dL (ref 0.44–1.00)
GFR, Estimated: >60 mL/min (ref >60)
Glucose, Bld: 96 mg/dL (ref 70–99)
Potassium: 3.8 mmol/L (ref 3.5–5.1)
Sodium: 137 mmol/L (ref 135–145)
Total Bilirubin: 0.7 mg/dL (ref 0.3–1.2)
Total Protein: 7.1 g/dL (ref 6.5–8.1)

## 2021-07-13 LAB — CBC
HCT: 36.5 % (ref 36.0–46.0)
Hemoglobin: 12.1 g/dL (ref 12.0–15.0)
MCH: 31 pg (ref 26.0–34.0)
MCHC: 33.2 g/dL (ref 30.0–36.0)
MCV: 93.6 fL (ref 80.0–100.0)
Platelets: 346 10*3/uL (ref 150–400)
RBC: 3.9 MIL/uL (ref 3.87–5.11)
RDW: 13.4 % (ref 11.5–15.5)
WBC: 6.1 10*3/uL (ref 4.0–10.5)
nRBC: 0.3 % — ABNORMAL HIGH (ref 0.0–0.2)

## 2021-07-13 LAB — TYPE AND SCREEN
ABO/RH(D): O POS
Antibody Screen: NEGATIVE

## 2021-07-13 LAB — SURGICAL PCR SCREEN
MRSA, PCR: NEGATIVE
Staphylococcus aureus: NEGATIVE

## 2021-07-13 LAB — HEMOGLOBIN A1C
Hgb A1c MFr Bld: 6.2 % — ABNORMAL HIGH (ref 4.8–5.6)
Mean Plasma Glucose: 131.24 mg/dL

## 2021-07-13 NOTE — Progress Notes (Signed)
PCP - Maye Hides, PA Cardiologist - denies  PPM/ICD - denies   Chest x-ray - 12/19/19 EKG - 07/13/21 at PAT Stress Test - denies ECHO - denies Cardiac Cath - denies  Sleep Study - 06/29/21, pt has OSA CPAP - waiting on CPAP  DM- Pre-diabetic, pt does not check CBG at home  Blood Thinner Instructions: n/a Aspirin Instructions: n/a  ERAS Protcol - no, NPO   COVID TEST- pt scheduled for testing on 07/15/21   Anesthesia review: no  Patient denies shortness of breath, fever, cough and chest pain at PAT appointment   All instructions explained to the patient, with a verbal understanding of the material. Patient agrees to go over the instructions while at home for a better understanding. Patient also instructed to wear a mask in public after being tested for COVID-19. The opportunity to ask questions was provided.

## 2021-07-15 ENCOUNTER — Other Ambulatory Visit (HOSPITAL_COMMUNITY)
Admission: RE | Admit: 2021-07-15 | Discharge: 2021-07-15 | Disposition: A | Discharge status: Home / Self Care | Payer: Medicaid Other | Source: Ambulatory Visit | Attending: Neurosurgery | Admitting: Neurosurgery

## 2021-07-15 DIAGNOSIS — Z20822 Contact with and (suspected) exposure to covid-19: Secondary | ICD-10-CM | POA: Insufficient documentation

## 2021-07-15 DIAGNOSIS — Z01812 Encounter for preprocedural laboratory examination: Secondary | ICD-10-CM | POA: Insufficient documentation

## 2021-07-15 DIAGNOSIS — Z01818 Encounter for other preprocedural examination: Secondary | ICD-10-CM

## 2021-07-15 LAB — SARS CORONAVIRUS 2 (TAT 6-24 HRS): SARS Coronavirus 2: NEGATIVE

## 2021-07-19 ENCOUNTER — Encounter (HOSPITAL_COMMUNITY): Admission: RE | Payer: Self-pay | Source: Home / Self Care

## 2021-07-19 ENCOUNTER — Ambulatory Visit (HOSPITAL_COMMUNITY): Admission: RE | Admit: 2021-07-19 | Payer: Medicaid Other | Source: Home / Self Care | Admitting: Neurosurgery

## 2021-07-19 SURGERY — POSTERIOR LUMBAR FUSION 2 LEVEL
Anesthesia: General

## 2021-09-21 ENCOUNTER — Emergency Department (HOSPITAL_COMMUNITY): Payer: Medicaid Other

## 2021-09-21 ENCOUNTER — Emergency Department (HOSPITAL_COMMUNITY)
Admission: EM | Admit: 2021-09-21 | Discharge: 2021-09-21 | Disposition: A | Discharge status: Home / Self Care | Payer: Medicaid Other | Attending: Emergency Medicine | Admitting: Emergency Medicine

## 2021-09-21 DIAGNOSIS — R0789 Other chest pain: Secondary | ICD-10-CM | POA: Diagnosis present

## 2021-09-21 DIAGNOSIS — Z79899 Other long term (current) drug therapy: Secondary | ICD-10-CM | POA: Insufficient documentation

## 2021-09-21 DIAGNOSIS — F410 Panic disorder [episodic paroxysmal anxiety] without agoraphobia: Secondary | ICD-10-CM | POA: Insufficient documentation

## 2021-09-21 LAB — CBC
HCT: 37.1 % (ref 36.0–46.0)
Hemoglobin: 12.8 g/dL (ref 12.0–15.0)
MCH: 30.8 pg (ref 26.0–34.0)
MCHC: 34.5 g/dL (ref 30.0–36.0)
MCV: 89.2 fL (ref 80.0–100.0)
Platelets: 313 10*3/uL (ref 150–400)
RBC: 4.16 MIL/uL (ref 3.87–5.11)
RDW: 13.6 % (ref 11.5–15.5)
WBC: 8.4 10*3/uL (ref 4.0–10.5)
nRBC: 0 % (ref 0.0–0.2)

## 2021-09-21 LAB — BASIC METABOLIC PANEL
Anion gap: 16 — ABNORMAL HIGH (ref 5–15)
BUN: 8 mg/dL (ref 6–20)
CO2: 22 mmol/L (ref 22–32)
Calcium: 9.3 mg/dL (ref 8.9–10.3)
Chloride: 102 mmol/L (ref 98–111)
Creatinine, Ser: 0.8 mg/dL (ref 0.44–1.00)
GFR, Estimated: >60 mL/min (ref >60)
Glucose, Bld: 80 mg/dL (ref 70–99)
Potassium: 3.4 mmol/L — ABNORMAL LOW (ref 3.5–5.1)
Sodium: 140 mmol/L (ref 135–145)

## 2021-09-21 LAB — I-STAT BETA HCG BLOOD, ED (MC, WL, AP ONLY): I-stat hCG, quantitative: <5 m[IU]/mL (ref <5)

## 2021-09-21 LAB — TROPONIN I (HIGH SENSITIVITY): Troponin I (High Sensitivity): 5 ng/L (ref <18)

## 2021-09-21 MED ORDER — ONDANSETRON 4 MG PO TBDP
4.0000 mg | ORAL_TABLET | Freq: Once | ORAL | Status: AC
  Administered 2021-09-21: 4 mg via ORAL
  Filled 2021-09-21: qty 1

## 2021-09-21 MED ORDER — KETOROLAC TROMETHAMINE 15 MG/ML IJ SOLN
15.0000 mg | Freq: Once | INTRAMUSCULAR | Status: AC
  Administered 2021-09-21: 15 mg via INTRAMUSCULAR
  Filled 2021-09-21: qty 1

## 2021-09-21 MED ORDER — ACETAMINOPHEN 500 MG PO TABS
1000.0000 mg | ORAL_TABLET | Freq: Once | ORAL | Status: AC
Start: 2021-09-21 — End: 2021-09-21
  Administered 2021-09-21: 1000 mg via ORAL
  Filled 2021-09-21: qty 2

## 2021-09-21 NOTE — ED Notes (Signed)
Pt reports the tingling to her lips and L hand has subsided and pt feels better.

## 2021-09-21 NOTE — ED Notes (Signed)
Reviewed discharge instructions with patient. Follow-up care reviewed. Patient verbalized understanding. Patient A&Ox4, VSS, and ambulatory with steady gait upon discharge.  

## 2021-09-21 NOTE — ED Triage Notes (Signed)
EMS stated, it sounds like a panic attack, had a little chest pain off and on since 0500 feels moe like a fluttering on left side. Pt is very anxious.

## 2021-09-21 NOTE — ED Provider Triage Note (Signed)
Emergency Medicine Provider Triage Evaluation Note  [name] [name] , a 56 y.o. female  was evaluated in triage.  Pt complains of chest pain.  Feels like fluttering on the left side of her chest, started this morning but worsened a few hours ago.  It is constant, does not radiate elsewhere.  No associated nausea or vomiting.  No history of heart attacks or blood clots, patient does smoke cigarettes daily..  Review of Systems  Positive: CP Negative: SYNCOPE  Physical Exam  BP (!) 171/119 (BP Location: Left Arm)    Pulse 88    Temp 98.7 F (37.1 C) (Oral)    Resp 18    SpO2 100%  Gen:   Awake, no distress   Resp:  Normal effort  MSK:   Moves extremities without difficulty  Other:  S1-S2, lungs clear to auscultation bilaterally  Medical Decision Making  Medically screening exam initiated at 1:20 PM.  Appropriate orders placed.  KIERIA PASCUZZI was informed that the remainder of the evaluation will be completed by another provider, this initial triage assessment does not replace that evaluation, and the importance of remaining in the ED until their evaluation is complete.  CP WORKUP   Sherrill Raring, [place] 09/21/21 1321

## 2021-09-21 NOTE — ED Notes (Signed)
Assumed pt care at this time

## 2021-09-21 NOTE — ED Provider Notes (Signed)
[facility] EMERGENCY DEPARTMENT Provider Note   CSN: [id number] Arrival date & time: 09/21/21  1307     History  Chief Complaint  Patient presents with   Chest Pain   Panic Attack    [name] [name] is a 56 y.o. female.  56 year old female with prior medical history as detailed below presents for evaluation.  Patient complains of left-sided anterior chest wall discomfort.  Symptoms began at 5 AM.  Pain is worse with movement of her chest.  She complains of increased pain with heavy lifting.  She denies associated nausea, vomiting, diaphoresis, shortness of breath.  The history is provided by the patient and medical records.  Chest Pain Pain location:  L chest Pain radiates to:  Does not radiate Pain severity:  Mild Onset quality:  Gradual Duration:  10 hours Timing:  Constant Progression:  Waxing and waning Chronicity:  New Context: movement       Home Medications Prior to Admission medications   Medication Sig Start Date End Date Taking? Authorizing Provider  acetaminophen (TYLENOL) 500 MG tablet Take 1 tablet (500 mg total) by mouth every 6 (six) hours as needed for up to 30 doses. 06/27/19   Curatolo, Adam, DO  albuterol (PROVENTIL) (2.5 MG/3ML) 0.083% nebulizer solution Take 3 mLs (2.5 mg total) by nebulization every 6 (six) hours as needed for wheezing or shortness of breath. 01/20/20   Ladell Pier, MD  albuterol (VENTOLIN HFA) 108 (90 Base) MCG/ACT inhaler Inhale 2 puffs into the lungs every 6 (six) hours as needed for wheezing or shortness of breath. 04/28/20   Ladell Pier, MD  amLODipine (NORVASC) 10 MG tablet Take 1 tablet (10 mg total) by mouth daily. 11/05/20   Ladell Pier, MD  atorvastatin (LIPITOR) 10 MG tablet Take 1 tablet (10 mg total) by mouth daily. Patient not taking: Reported on 07/09/2021 12/24/20   Ladell Pier, MD  atorvastatin (LIPITOR) 40 MG tablet Take 40 mg by mouth daily.    [provider]   celecoxib (CELEBREX) 200 MG capsule Take 1 capsule (200 mg total) by mouth daily. Stop Ibuprofen Patient not taking: Reported on 07/09/2021 10/15/20   Ladell Pier, MD  cetirizine (ZYRTEC) 10 MG tablet Take 10 mg by mouth daily. 01/28/21   [provider]  cyclobenzaprine (FLEXERIL) 10 MG tablet Take 10 mg by mouth 3 (three) times daily. 01/07/21   [provider]  Diclofenac Sodium 3 % GEL Apply 1 application topically 2 (two) times daily as needed (pain). 02/11/21   [provider]  DULoxetine (CYMBALTA) 60 MG capsule TAKE ONE CAPSULE BY MOUTH ONCE DAILY (AM) 05/24/21   Dorna Mai, MD  fluticasone [facility]) 50 MCG/ACT nasal spray Place 2 sprays into both nostrils daily. Patient taking differently: Place 2 sprays into both nostrils daily as needed for allergies. 12/30/19   Elsie Stain, MD  fluticasone-salmeterol (ADVAIR) 250-50 MCG/ACT AEPB Inhale 1 puff into the lungs in the morning and at bedtime.    [provider]  HYDROcodone-acetaminophen (NORCO) 5-325 MG tablet Take 1-2 tablets by mouth daily as needed. Patient not taking: Reported on 07/09/2021 11/16/20   Aundra Dubin, PA-C  HYDROcodone-acetaminophen [facility]) 7.5-325 MG tablet Take 1 tablet by mouth 4 (four) times daily as needed for pain. 06/10/21   [provider]  ibuprofen (ADVIL) 800 MG tablet Take 1 tablet (800 mg total) by mouth every 6 (six) hours as needed. Take in between doses of pain medication for  additional relief or for mild pain Patient not taking: Reported on 07/09/2021 10/01/20   Landis Martins, DPM  lidocaine (LIDODERM) 5 % Place 1 patch onto the skin daily. Remove & Discard patch within 12 hours or as directed by MD 07/08/21   Landis Martins, DPM  lisinopril-hydrochlorothiazide (ZESTORETIC) 20-25 MG tablet Take 1 tablet by mouth daily. 12/23/20   Ladell Pier, MD  losartan (COZAAR) 50 MG tablet Take 50 mg by mouth daily.    [provider]  Loteprednol  Etabonate (EYSUVIS) 0.25 % SUSP Place 1 drop into both eyes at bedtime.    [provider]  Multiple Vitamin (MULTIVITAMIN) tablet Take 1 tablet by mouth daily.    [provider]  nortriptyline (PAMELOR) 25 MG capsule Take 25 mg by mouth at bedtime.    [provider]  omeprazole (PRILOSEC) 40 MG capsule Take 40 mg by mouth daily as needed (heartburn). 01/07/21   [provider]  pantoprazole (PROTONIX) 40 MG tablet Take 1 tablet (40 mg total) by mouth daily. Patient taking differently: Take 40 mg by mouth daily as needed (heartburn). 11/05/20   Ladell Pier, MD  predniSONE (STERAPRED UNI-PAK 21 TAB) 10 MG (21) TBPK tablet Take as directed Patient not taking: Reported on 07/09/2021 06/03/21   Landis Martins, DPM  promethazine (PHENERGAN) 12.5 MG tablet Take 1 tablet (12.5 mg total) by mouth every 6 (six) hours as needed for nausea or vomiting. Patient not taking: Reported on 07/09/2021 06/15/20   Landis Martins, DPM  RESTASIS 0.05 % ophthalmic emulsion Place 1 drop into both eyes in the morning. 01/21/21   [provider]  rOPINIRole (REQUIP) 0.25 MG tablet Take 1 tablet (0.25 mg total) by mouth at bedtime. Patient not taking: Reported on 07/09/2021 07/05/21   Ladell Pier, MD  Sodium Sulfate-Mag Sulfate-KCl (SUTAB) [phone] MG TABS Take 1 kit by mouth as directed. 12/15/20   Esterwood, Amy S, PA-C  Spacer/Aero-Holding Chambers (AEROCHAMBER MV) inhaler Use as instructed 12/30/19   Elsie Stain, MD  SUMAtriptan (IMITREX) 100 MG tablet Take 1 tablet earliest onset of migraine.  May repeat in 2 hours if headache persists or recurs.  Maximum 2 tablets in 24 hours. 11/13/19   Pieter Partridge, DO  SUMAtriptan (IMITREX) 25 MG tablet Take 25 mg by mouth every 2 (two) hours as needed for migraine.    [provider]  traMADol (ULTRAM) 50 MG tablet Take 1 tablet (50 mg total) by mouth 3 (three) times daily as needed. 11/04/20   Aundra Dubin,  PA-C  triamcinolone cream (KENALOG) 0.5 % Apply 1 application topically 2 (two) times daily as needed (irritation). 02/11/21   [provider]  zolpidem (AMBIEN) 5 MG tablet TAKE ONE TABLET BY MOUTH DAILY AT BEDTIME. AS NEEDED FOR SLEEP 01/22/21   Landis Martins, DPM      Allergies    Diphenhydramine hcl and Metoprolol    Review of Systems   Review of Systems  Cardiovascular:  Positive for chest pain.  All other systems reviewed and are negative.  Physical Exam Updated Vital Signs BP (!) 168/89 (BP Location: Right Arm)    Pulse 84    Temp 98.6 F (37 C) (Oral)    Resp 16    SpO2 99%  Physical Exam Vitals and nursing note reviewed.  Constitutional:      General: She is not in acute distress.    Appearance: Normal appearance. She is well-developed.  HENT:     Head:  Normocephalic and atraumatic.  Eyes:     Conjunctiva/sclera: Conjunctivae normal.     Pupils: Pupils are equal, round, and reactive to light.  Cardiovascular:     Rate and Rhythm: Normal rate and regular rhythm.     Heart sounds: Normal heart sounds.  Pulmonary:     Effort: Pulmonary effort is normal. No respiratory distress.     Breath sounds: Normal breath sounds.  Chest:     Comments: Mild tenderness in the anterior left chest wall.  Patient's pain is reproduced completely with palpation of this area Abdominal:     General: There is no distension.     Palpations: Abdomen is soft.     Tenderness: There is no abdominal tenderness.  Musculoskeletal:        General: No deformity. Normal range of motion.     Cervical back: Normal range of motion and neck supple.  Skin:    General: Skin is warm and dry.  Neurological:     General: No focal deficit present.     Mental Status: She is alert and oriented to person, place, and time.    ED Results / Procedures / Treatments   Labs (all labs ordered are listed, but only abnormal results are displayed) Labs Reviewed  BASIC METABOLIC PANEL - Abnormal; Notable  for the following components:      Result Value   Potassium 3.4 (*)    Anion gap 16 (*)    All other components within normal limits  CBC  I-STAT BETA HCG BLOOD, ED ([facility], [facility], [facility] ONLY)  TROPONIN I (HIGH SENSITIVITY)    EKG EKG Interpretation  Date/Time:  Tuesday September 21 2021 13:15:00 EST Ventricular Rate:  82 PR Interval:  160 QRS Duration: 84 QT Interval:  408 QTC Calculation: 476 R Axis:   66 Text Interpretation: Normal sinus rhythm Normal ECG When compared with ECG of 13-Jul-2021 13:47, PREVIOUS ECG IS PRESENT Confirmed by Dene Gentry [phone]) on 09/21/2021 2:16:19 PM  Radiology DG Chest 2 View  Result Date: 09/21/2021 CLINICAL DATA:  CHEST PAIN EXAM: CHEST - 2 VIEW COMPARISON:  Dec 19, 2019. FINDINGS: No consolidation. No visible pleural effusions or pneumothorax. Cardiomediastinal silhouette is within normal limits and similar to prior. IMPRESSION: No evidence of acute cardiopulmonary disease. Electronically Signed   By: Margaretha Sheffield M.D.   On: 09/21/2021 13:52    Procedures Procedures    Medications Ordered in ED Medications  acetaminophen (TYLENOL) tablet 1,000 mg (1,000 mg Oral Given 09/21/21 1436)  ketorolac (TORADOL) 15 MG/ML injection 15 mg (15 mg Intramuscular Given 09/21/21 1437)  ondansetron (ZOFRAN-ODT) disintegrating tablet 4 mg (4 mg Oral Given 09/21/21 1436)    ED Course/ Medical Decision Making/ A&P                           Medical Decision Making Risk OTC drugs. Prescription drug management.    Medical Screen Complete  This patient presented to the ED with complaint of chest wall pain.  This complaint involves an extensive number of treatment options. The initial differential diagnosis includes, but is not limited to, muscular chest wall pain, pulmonary pathology, ACS, etc.  This presentation is: Acute, Self-Limited, Previously Undiagnosed, Uncertain Prognosis, Complicated, Systemic Symptoms, and Threat to Life/Bodily Function  Patient  presents with complaint of left-sided anterior chest wall pain.  Patient's describe symptoms are reproducible with palpitation of the area.  Additionally, patient's EKG and troponin are not suggestive of acute ischemia.  Patient does feel improved after administration of Toradol and Tylenol  She desires DC home.  She does understand need for close follow-up.   Additional history obtained:  Additional history obtained from [facility] External records from outside sources obtained and reviewed including prior ED visits and prior Inpatient records.    Lab Tests:  I ordered and personally interpreted labs.  The pertinent results include: CBC, BMP, troponin   Imaging Studies ordered:  I ordered imaging studies including chest x-ray I independently visualized and interpreted obtained imaging which showed NAD I agree with the radiologist interpretation.   Cardiac Monitoring:  The patient was maintained on a cardiac monitor.  I personally viewed and interpreted the cardiac monitor which showed an underlying rhythm of: NSR   Medicines ordered:  I ordered medication including acetaminophen, Toradol, Zofran for pain and nausea Reevaluation of the patient after these medicines showed that the patient: improved   Problem List / ED Course:  Chest wall pain   Reevaluation:  After the interventions noted above, I reevaluated the patient and found that they have: improved   Disposition:  After consideration of the diagnostic results and the patients response to treatment, I feel that the patent would benefit from close outpatient follow-up.          Final Clinical Impression(s) / ED Diagnoses Final diagnoses:  Chest wall pain    Rx / DC Orders ED Discharge Orders     None         Valarie Merino, MD 09/21/21 1624

## 2021-09-21 NOTE — Discharge Instructions (Signed)
Return for any problem.  ?

## 2021-09-27 ENCOUNTER — Other Ambulatory Visit: Payer: Self-pay | Admitting: Family Medicine

## 2021-09-27 DIAGNOSIS — F331 Major depressive disorder, recurrent, moderate: Secondary | ICD-10-CM

## 2021-09-28 ENCOUNTER — Other Ambulatory Visit: Payer: Self-pay | Admitting: *Deleted

## 2022-01-13 ENCOUNTER — Ambulatory Visit: Payer: Medicaid Other | Admitting: Physical Therapy

## 2022-01-17 ENCOUNTER — Ambulatory Visit (INDEPENDENT_AMBULATORY_CARE_PROVIDER_SITE_OTHER): Payer: Medicaid Other

## 2022-01-17 ENCOUNTER — Ambulatory Visit (INDEPENDENT_AMBULATORY_CARE_PROVIDER_SITE_OTHER): Payer: Medicaid Other | Admitting: Podiatry

## 2022-01-17 ENCOUNTER — Ambulatory Visit: Payer: Medicaid Other

## 2022-01-17 DIAGNOSIS — M722 Plantar fascial fibromatosis: Secondary | ICD-10-CM | POA: Diagnosis not present

## 2022-01-17 DIAGNOSIS — M779 Enthesopathy, unspecified: Secondary | ICD-10-CM | POA: Diagnosis not present

## 2022-01-17 MED ORDER — TRIAMCINOLONE ACETONIDE 10 MG/ML IJ SUSP
20.0000 mg | Freq: Once | INTRAMUSCULAR | Status: AC
  Administered 2022-01-17: 20 mg

## 2022-01-17 NOTE — Progress Notes (Signed)
Subjective:   Patient ID: [name] [name], female   DOB: 56 y.o.   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  For the left I did sterile prep injected the plantar fascia 3 mg Kenalog 5 mg Xylocaine at insertion calcaneus  X-rays were negative for signs of fracture negative for signs of bony injury with spur formation noted in the plantar heel

## 2022-01-19 ENCOUNTER — Ambulatory Visit: Payer: Medicaid Other | Attending: Orthopedic Surgery | Admitting: Physical Therapy

## 2022-03-24 ENCOUNTER — Other Ambulatory Visit: Payer: Self-pay

## 2022-04-13 ENCOUNTER — Other Ambulatory Visit: Payer: Self-pay

## 2022-09-05 DIAGNOSIS — M47816 Spondylosis without myelopathy or radiculopathy, lumbar region: Secondary | ICD-10-CM | POA: Insufficient documentation

## 2022-12-29 ENCOUNTER — Telehealth: Payer: Self-pay | Admitting: Gastroenterology

## 2022-12-29 NOTE — Telephone Encounter (Signed)
Patient called in with complaints of difficulty swallowing, unable to swallow certain foods. She is currently able to hold down liquids/food. She says this happened a couple of years ago as well and needed procedure. She was here on 01/28/21 for EGD w/dilation with Dr. Chales Abrahams. She is still currently taking pantoprazole 40 mg QD as prescribed. She is also due for a colon. OV scheduled with Amy, PA for 01/05/23 at 1:30 pm. Patient verbalized all understanding.

## 2022-12-29 NOTE — Telephone Encounter (Signed)
Inbound call from patient, would like to discuss scheduling a esophageal dilation again. Patient states she is experiencing dysphagia again and would like procedure done. Please advise.

## 2023-01-05 ENCOUNTER — Ambulatory Visit: Payer: Medicaid Other | Admitting: Physician Assistant

## 2023-01-05 ENCOUNTER — Telehealth: Payer: Self-pay

## 2023-01-05 NOTE — Telephone Encounter (Signed)
Patient was scheudled for OV today with Mike Gip PA, provider ill.  Discussed chart with Dr. Chales Abrahams, patient's primary GI provider, patient with a Hx of GERD and dysphagia with Schatzki's ring and dilation in 2022.  Patient is also due for a colonoscopy. Per Dr. Chales Abrahams okay for direct endo/colon and pre-visit.  Patient notified of recommendations. Patient declines colonoscopy but will agree to direct EGD.  Patient has been scheduled for a pre-visit via phone for 01/16/23 4:00 and EGD 02/08/23 4:00.

## 2023-01-16 ENCOUNTER — Ambulatory Visit (AMBULATORY_SURGERY_CENTER): Payer: Medicaid Other

## 2023-01-16 VITALS — Ht 64.0 in | Wt 167.0 lb

## 2023-01-16 DIAGNOSIS — R1319 Other dysphagia: Secondary | ICD-10-CM

## 2023-01-16 NOTE — Progress Notes (Signed)
No egg or soy allergy known to patient  No issues known to pt with past sedation with any surgeries or procedures Patient denies ever being told they had issues or difficulty with intubation  No FH of Malignant Hyperthermia Pt is not on diet pills Pt is not on  home 02  Pt is not on blood thinners  Pt denies issues with constipation  No A fib or A flutter Have any cardiac testing pending--no  Pt is ambulatory    PV completed with patient. Prep instructions reviewed and sent to patient via mychart and to address.

## 2023-01-17 ENCOUNTER — Ambulatory Visit: Payer: Medicaid Other | Admitting: Physician Assistant

## 2023-02-08 ENCOUNTER — Encounter: Payer: Medicaid Other | Admitting: Gastroenterology

## 2023-03-20 ENCOUNTER — Ambulatory Visit: Payer: Medicaid Other | Admitting: Podiatry

## 2023-03-20 DIAGNOSIS — G629 Polyneuropathy, unspecified: Secondary | ICD-10-CM | POA: Diagnosis not present

## 2023-03-20 MED ORDER — GABAPENTIN 800 MG PO TABS: 800.0000 mg | ORAL_TABLET | Freq: Three times a day (TID) | ORAL | 4 refills | Status: AC

## 2023-03-22 NOTE — Progress Notes (Signed)
Subjective:   Patient ID: [name] [name], female   DOB: 57 y.o.   MRN: [id number]   HPI Patient states the top and bottom of her foot burns like fire patient does have back issues states she does not want any injections at this time   ROS      Objective:  Physical Exam  Neurovascular status intact with patient's right foot healing well as far as signs of any infection but does have neurological burning shooting type discomfort     Assessment:  Most likely neuropathy which may be related to back issues and other problems with patient having tried lower dose gabapentin which she feels like did not help     Plan:  H&P reviewed discussed neuropathy great length and relationship to back and at this point did discuss treatment options with patient not wanting any form of injection treatment.  I have recommended gabapentin at a higher dose that she tolerated lower doses without any issue and I would like her to start 1 at night then 1 morning midday as tolerated over a 4-week.  And that it may take several months to have good relief for her symptoms.  Patient was given 800 mg to be taken at night and then 1 morning 1 midday

## 2023-03-28 ENCOUNTER — Telehealth: Payer: Self-pay | Admitting: Gastroenterology

## 2023-03-28 ENCOUNTER — Encounter: Payer: Medicaid Other | Admitting: Gastroenterology

## 2023-03-28 NOTE — Telephone Encounter (Signed)
Hi Dr Chales Abrahams,   I called patient regarding her procedure appointment, she stated she would not be able to make it because a family member had passed away. Please advise on no show.   Thank you

## 2023-03-29 NOTE — Telephone Encounter (Signed)
Please reschedule Do not charge: no-show RG

## 2023-05-09 ENCOUNTER — Ambulatory Visit: Payer: Medicaid Other | Attending: Orthopedic Surgery

## 2023-05-09 DIAGNOSIS — G8929 Other chronic pain: Secondary | ICD-10-CM | POA: Insufficient documentation

## 2023-05-09 DIAGNOSIS — R2689 Other abnormalities of gait and mobility: Secondary | ICD-10-CM | POA: Insufficient documentation

## 2023-05-09 DIAGNOSIS — R600 Localized edema: Secondary | ICD-10-CM | POA: Insufficient documentation

## 2023-05-09 DIAGNOSIS — M25561 Pain in right knee: Secondary | ICD-10-CM | POA: Insufficient documentation

## 2023-05-09 DIAGNOSIS — M6281 Muscle weakness (generalized): Secondary | ICD-10-CM | POA: Insufficient documentation

## 2023-05-19 ENCOUNTER — Ambulatory Visit: Payer: Medicaid Other | Admitting: Physical Therapy

## 2023-05-23 ENCOUNTER — Ambulatory Visit: Payer: Medicaid Other | Admitting: Physical Therapy

## 2023-05-26 ENCOUNTER — Other Ambulatory Visit: Payer: Self-pay

## 2023-05-26 ENCOUNTER — Encounter: Payer: Self-pay | Admitting: Physical Therapy

## 2023-05-26 ENCOUNTER — Ambulatory Visit: Payer: Medicaid Other | Admitting: Physical Therapy

## 2023-05-26 DIAGNOSIS — M6281 Muscle weakness (generalized): Secondary | ICD-10-CM

## 2023-05-26 DIAGNOSIS — R600 Localized edema: Secondary | ICD-10-CM | POA: Diagnosis present

## 2023-05-26 DIAGNOSIS — M25561 Pain in right knee: Secondary | ICD-10-CM | POA: Diagnosis present

## 2023-05-26 DIAGNOSIS — G8929 Other chronic pain: Secondary | ICD-10-CM | POA: Diagnosis present

## 2023-05-26 DIAGNOSIS — R2689 Other abnormalities of gait and mobility: Secondary | ICD-10-CM

## 2023-05-26 NOTE — Therapy (Addendum)
 OUTPATIENT PHYSICAL THERAPY EVALUATION  DISCHARGE   Patient Name: [name] [name] MRN: [id number] DOB:February 26, 1966, 57 y.o., female Today's Date: 05/26/2023   END OF SESSION:  PT End of Session - 05/26/23 0946     Visit Number 1    Number of Visits 17    Date for PT Re-Evaluation 07/21/23    Authorization Type [employer]    Authorization - Number of Visits 27    PT Start Time 1015    PT Stop Time 1100    PT Time Calculation (min) 45 min    Activity Tolerance Patient limited by pain    Behavior During Therapy [facility] for tasks assessed/performed             Past Medical History:  Diagnosis Date   Alcohol use disorder, moderate, dependence (HCC)    06-09-2020  per pt not taking naltrexone as prescribed since august 2021, unable to afford it   Allergic rhinitis, seasonal    Arthritis    Asthma    Bipolar 1 disorder (HCC)    Chronic constipation    Chronic low back pain    Chronic pain of right knee    COPD (chronic obstructive pulmonary disease) (HCC) 2022   DDD (degenerative disc disease)    Depression    GERD (gastroesophageal reflux disease)    History of 2019 novel coronavirus disease (COVID-19) 05/2019   06-09-2020  per pt residual chronic cough and intermittant sob   History of gastric ulcer    Hyperlipidemia    Hypertension    MDD (major depressive disorder)    Migraine    Mild obstructive sleep apnea    sleep study in epic 01-22-2020, recommended pt to have cpap titrate ,  per pt due to pain have not gone back    Moderate persistent asthma    followed by pcp   Osteoarthritis    Post-COVID chronic cough    Pre-diabetes    Sciatica    Sleep apnea    SOB (shortness of breath)    06-09-2020  per pt intermittant due to residual from covid    Wears glasses    Wears glasses    Past Surgical History:  Procedure Laterality Date   BUNIONECTOMY Left 2011   HEEL SPUR SURGERY Right 2012   KNEE ARTHROSCOPY WITH ANTERIOR CRUCIATE LIGAMENT (ACL) REPAIR WITH  HAMSTRING GRAFT Right 01/31/2014   Procedure: RIGHT KNEE ARTHROSCOPY WITH ALLOGRAFT (ACL) ANTERIOR CRUCIATE LIGAMENT RECONSTRUCTON PARTIAL MENISCECTOMY VERSES REPAIR ;  Surgeon: Eugenia Mcalpine, MD;  Location: [facility];  Service: Orthopedics;  Laterality: Right;   PLANTAR FASCIA RELEASE Right 06/15/2020   Procedure: ENDOSCOPIC PLANTAR FASCIOTOMY WITH EXCISION OF PAINFUL SCAR;  Surgeon: Asencion Islam, DPM;  Location: [facility];  Service: Podiatry;  Laterality: Right;  LOCAL   TOTAL ABDOMINAL HYSTERECTOMY W/ BILATERAL SALPINGOOPHORECTOMY  04-20-2006   Patient Active Problem List   Diagnosis Date Noted   Arthropathy of lumbar facet joint 09/05/2022   Essential hypertension 02/25/2021   Migraine with aura 02/25/2021   Mild intermittent asthma 02/25/2021   Mixed hyperlipidemia 02/25/2021   Osteoarthritis of knee 02/25/2021   Other intervertebral disc degeneration, lumbar region 02/25/2021   Vitamin D deficiency 02/25/2021   Esophageal dysphagia 11/05/2020   Odontogenic infection of jaw 06/10/2020   OSA (obstructive sleep apnea) 04/01/2020   Moderate persistent asthma with acute exacerbation 12/30/2019   Chronic coughing 12/30/2019   Seasonal allergies 12/30/2019   Loud snoring 12/16/2019   Prediabetes 01/04/2019  Obesity (BMI 30-39.9) 01/04/2019   Ganglion cyst 09/28/2018   Carpal tunnel syndrome of right wrist 09/28/2018   Chronic midline low back pain without sciatica 11/23/2017   Elevated blood pressure reading with diagnosis of hypertension 11/23/2017   Chronic pain of right knee 11/23/2017   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   Marijuana abuse 11/23/2017   Tobacco dependence 11/23/2017   Alcohol abuse 07/30/2016   S/P ACL reconstruction 01/31/2014   DEPRESSION 11/17/2008   ALLERGIC RHINITIS 11/17/2008   GERD 11/17/2008    PCP: Maye Hides, PA  REFERRING PROVIDER: Jodi Geralds, MD  REFERRING DIAG: S/P Right Knee  Arthroscopy  THERAPY DIAG:  Chronic pain of right knee  Muscle weakness (generalized)  Other abnormalities of gait and mobility  Localized edema  Rationale for Evaluation and Treatment: Rehabilitation  ONSET DATE: 04/19/2023   SUBJECTIVE:  SUBJECTIVE STATEMENT: Patient reports she has knee surgery on 04/19/2023 and it still hurts and is still popping. She does sometimes have to use a walker due to the pain but is not using it today so far. She has pain when she is on the knee with any standing or walking. She does use ice and medication at home that helps. She is able to sleep but in the mornings she hurts, and then before she goes to bed she hurts. She also had the same surgery last year on the right knee. If she does a lot of walking then her knee will get swollen and painful.  PERTINENT HISTORY: See PMH above  PAIN:  Are you having pain? Yes:  NPRS scale: 6/10 Pain location: Right knee Pain description: Sore, aching, tender Aggravating factors: Walking, standing, bending, activity Relieving factors: Medication, ice  PRECAUTIONS: None  RED FLAGS: None   WEIGHT BEARING RESTRICTIONS: No  FALLS:  Has patient fallen in last 6 months? No  LIVING ENVIRONMENT: Lives with: lives with their partner Lives in: House/apartment Stairs: Yes: Internal: 12 steps; on right going up Has following equipment at home: Walker - 2 wheeled  PLOF: Independent  PATIENT GOALS: Get back to walking normally   OBJECTIVE:  Note: Objective measures were completed at Evaluation unless otherwise noted. PATIENT SURVEYS:  FOTO 36% functional status  COGNITION: Overall cognitive status: Within functional limits for tasks assessed     SENSATION: Appears intact  EDEMA:  Circumferential: right 40 cm, left 38 cm  MUSCLE LENGTH: Limitations with right hamstring, calf, and quad flexibility  POSTURE:   WFL  PALPATION: Generalized tenderness of the right knee, patellar  hypomobility  LOWER EXTREMITY ROM:  Active ROM Right eval Left eval  Hip flexion    Hip extension    Hip abduction    Hip adduction    Hip internal rotation    Hip external rotation    Knee flexion 78 128  Knee extension 4 hyper 10 hyper  Ankle dorsiflexion    Ankle plantarflexion    Ankle inversion    Ankle eversion     (Blank rows = not tested)  LOWER EXTREMITY MMT:  MMT Right eval Left eval  Hip flexion 4- 4  Hip extension 3 4  Hip abduction 3 4  Hip adduction    Hip internal rotation    Hip external rotation    Knee flexion 4- 5  Knee extension 3 5  Ankle dorsiflexion    Ankle plantarflexion    Ankle inversion    Ankle eversion     (Blank rows = not tested)  FUNCTIONAL TESTS:  5  times sit to stand: 24 seconds  GAIT: Distance walked: 100 ft Assistive device utilized: None Level of assistance: Complete Independence Comments: Antalgic gait on right   TODAY'S TREATMENT:     [facility] Adult PT Treatment:                                                DATE: 05/26/2023 Therapeutic Exercise: Quad set SLR Longsitting calf stretch Sidelying hip abduction Seated knee flexion stretch LAQ  PATIENT EDUCATION:  Education details: Exam findings, POC, HEP Person educated: Patient Education method: Explanation, Demonstration, Tactile cues, Verbal cues, and Handouts Education comprehension: verbalized understanding, returned demonstration, verbal cues required, tactile cues required, and needs further education  HOME EXERCISE PROGRAM: Access Code: [email or web address]    ASSESSMENT: CLINICAL IMPRESSION: Patient is a 57 y.o. female who was seen today for physical therapy evaluation and treatment for right knee pain following right knee arthroscopy on 04/19/2023. Currently she demonstrates limitations with right knee flexion, strength, increased swelling, gait deviations, and pain with activity that is impacting her functional ability.   OBJECTIVE IMPAIRMENTS: Abnormal gait,  decreased activity tolerance, decreased balance, decreased ROM, decreased strength, increased edema, impaired flexibility, and pain.   ACTIVITY LIMITATIONS: bending, sitting, standing, squatting, stairs, transfers, and locomotion level  PARTICIPATION LIMITATIONS: meal prep, cleaning, driving, shopping, and community activity  PERSONAL FACTORS: Fitness, Past/current experiences, and Time since onset of injury/illness/exacerbation are also affecting patient's functional outcome.   REHAB POTENTIAL: Good  CLINICAL DECISION MAKING: Stable/uncomplicated  EVALUATION COMPLEXITY: Low   GOALS: Goals reviewed with patient? Yes  SHORT TERM GOALS: Target date: 06/23/2023  Patient will be I with initial HEP in order to progress with therapy. Baseline: HEP provided at eval Goal status: INITIAL  2.  Patient will report right knee pain </= 6/10 in order to reduce functional limitations Baseline: 3/10 pain Goal status: INITIAL  3.  Patient will exhibit right flexion AROM 120 deg in order to improve her gait and mobility Baseline: 78 deg Goal status: INITIAL  LONG TERM GOALS: Target date: 07/21/2023  Patient will be I with final HEP to maintain progress from PT. Baseline: HEP provided at eval Goal status: INITIAL  2.  Patient will report >/= 49% status on FOTO to indicate improved functional ability. Baseline: 36% functional status Goal status: INITIAL  3.  Patient will demonstrate right knee strength >/= 5/5 MMT in order to improve her walking and activity tolerance Baseline: see knee strength limitations above Goal status: INITIAL  4.  Patient will perform 5xSTS </= 12 seconds to indicate improved LE strength and mobility Baseline: 24 seconds Goal status: INITIAL  5. Patient will be able to walk a mile without increase in pain or need for seated rest break to improve community access  Baseline: unable  Goal status: INITIAL   PLAN: PT FREQUENCY: 1-2x/week  PT DURATION: 8  weeks  PLANNED INTERVENTIONS: 97164- PT Re-evaluation, 97110-Therapeutic exercises, 97530- Therapeutic activity, 97112- Neuromuscular re-education, 97535- Self Care, 16109- Manual therapy, [id number]- Gait training, [phone]- Aquatic Therapy, 97014- Electrical stimulation (unattended), [id number]- Electrical stimulation (manual), 97016- Vasopneumatic device, Patient/Family education, Balance training, Taping, Dry Needling, Joint mobilization, Cryotherapy, and Moist heat  PLAN FOR NEXT SESSION: Review HEP and progress PRN, manual/stretching to improve right knee flexion, progress quad strengthening and control as tolerated, progress to closed chain strengthening when able, gait and balance training, vaso for  swelling management post session as needed    Rosana Hoes, PT, DPT, LAT, ATC 05/26/23  11:13 AM Phone: [phone] Fax: [phone]    PHYSICAL THERAPY DISCHARGE SUMMARY  Visits from Start of Care: 1  Current functional level related to goals / functional outcomes: See above   Remaining deficits: See above   Education / Equipment: HEP   Patient agrees to discharge. Patient goals were not met. Patient is being discharged due to not returning since the last visit.  Rosana Hoes, PT, DPT, LAT, ATC 10/18/23  3:31 PM Phone: [phone] Fax: [phone]

## 2023-05-26 NOTE — Patient Instructions (Signed)
Access Code: [email or web address] URL: [email or web address] Date: 05/26/2023 Prepared by: Rosana Hoes  Exercises - Supine Quad Set  - 5 x daily - 10 reps - 5 seconds hold - Active Straight Leg Raise with Quad Set  - 3 x daily - 3 sets - 5 reps - Long Sitting Calf Stretch with Strap  - 3 x daily - 3 reps - 30 seconds hold - Sidelying Hip Abduction  - 3 x daily - 2 sets - 10 reps - Seated Knee Flexion Stretch  - 3 x daily - 10 reps - 10 seconds hold - Seated Long Arc Quad  - 3 x daily - 10 reps - 5 seconds hold

## 2023-05-30 ENCOUNTER — Ambulatory Visit: Payer: Medicaid Other | Admitting: Physical Therapy

## 2023-05-30 ENCOUNTER — Telehealth: Payer: Self-pay | Admitting: Physical Therapy

## 2023-05-30 NOTE — Telephone Encounter (Signed)
Contacted patient due to missed PT appointment. Patient states that she is sick with a cough so will be going to see her PCP tomorrow. She confirmed her next scheduled appointment on 06/02/23 and she was instructed to call and cancel her appointment depending on if her PCP wants her to isolate due to illness. Patient was informed of attendance policy and she expressed understanding.   Rosana Hoes, PT, DPT, LAT, ATC 05/30/23  3:12 PM Phone: [phone] Fax: [phone]

## 2023-06-02 ENCOUNTER — Ambulatory Visit: Payer: Medicaid Other | Admitting: Physical Therapy

## 2023-06-06 ENCOUNTER — Telehealth: Payer: Self-pay | Admitting: Physical Therapy

## 2023-06-06 ENCOUNTER — Ambulatory Visit: Payer: Medicaid Other | Admitting: Physical Therapy

## 2023-06-06 NOTE — Telephone Encounter (Signed)
Detailed message left on voicemail regarding no show policy. One appointment is left on the schedule. Will be allowed to schedule one appointment at a time going forward.

## 2023-06-16 ENCOUNTER — Ambulatory Visit: Payer: Medicaid Other | Attending: Orthopedic Surgery | Admitting: Physical Therapy

## 2023-06-16 ENCOUNTER — Encounter: Payer: Medicaid Other | Admitting: Physical Therapy

## 2023-06-20 ENCOUNTER — Encounter: Payer: Medicaid Other | Admitting: Physical Therapy

## 2023-06-23 ENCOUNTER — Encounter: Payer: Medicaid Other | Admitting: Physical Therapy

## 2023-06-27 ENCOUNTER — Encounter: Payer: Medicaid Other | Admitting: Physical Therapy

## 2023-06-30 ENCOUNTER — Encounter: Payer: Medicaid Other | Admitting: Physical Therapy

## 2023-12-12 ENCOUNTER — Other Ambulatory Visit (HOSPITAL_COMMUNITY): Payer: Self-pay

## 2024-04-24 NOTE — Progress Notes (Signed)
 Sent message, via epic in basket, requesting orders in epic from [facility].

## 2024-04-29 NOTE — Progress Notes (Signed)
 Second request for pre op orders: Spoke with [facility].

## 2024-04-30 NOTE — Patient Instructions (Signed)
 SURGICAL WAITING ROOM VISITATION Patients having surgery or a procedure may have no more than 2 support people in the waiting area - these visitors may rotate in the visitor waiting room.   If the patient needs to stay at the hospital during part of their recovery, the visitor guidelines for inpatient rooms apply.  PRE-OP VISITATION  Pre-op nurse will coordinate an appropriate time for 1 support person to accompany the patient in pre-op.  This support person may not rotate.  This visitor will be contacted when the time is appropriate for the visitor to come back in the pre-op area.  Please refer to the [facility] website for the visitor guidelines for Inpatients (after your surgery is over and you are in a regular room).  You are not required to quarantine at this time prior to your surgery. However, you must do this: Hand Hygiene often Do NOT share personal items Notify your provider if you are in close contact with someone who has COVID or you develop fever 100.4 or greater, new onset of sneezing, cough, sore throat, shortness of breath or body aches.  If you test positive for Covid or have been in contact with anyone that has tested positive in the last 10 days please notify you surgeon.    Your procedure is scheduled on:  Monday  May 13, 2024  Report to [facility] Main Entrance: [address] entrance where the [employer] is available.   Report to admitting at: 06:45  AM  Call this number if you have any questions or problems the morning of surgery [phone]  Do not eat food after Midnight the night prior to your surgery/procedure.  After Midnight you may have the following liquids until  06:15 AM DAY OF SURGERY  Clear Liquid Diet Water Black Coffee (sugar ok, NO MILK/CREAM OR CREAMERS)  Tea (sugar ok, NO MILK/CREAM OR CREAMERS) regular and decaf                             Plain Jell-O  with no fruit (NO RED)                                           Fruit ices  (not with fruit pulp, NO RED)                                     Popsicles (NO RED)                                                                  Juice: NO CITRUS JUICES: only apple, Grismore grape, Rief cranberry Sports drinks like Gatorade or Powerade (NO RED)                 FOLLOW ANY ADDITIONAL PRE OP INSTRUCTIONS YOU RECEIVED FROM YOUR SURGEON'S OFFICE!!!   Oral Hygiene is also important to reduce your risk of infection.        Remember - BRUSH YOUR TEETH THE MORNING OF SURGERY WITH YOUR REGULAR TOOTHPASTE  Do NOT  smoke after Midnight the night before surgery.  STOP TAKING all Vitamins, Herbs and supplements 1 week before your surgery.   Take ONLY these medicines the morning of surgery with A SIP OF WATER:  Amlodipine , Gabapentin , Cetirizine . You may take EITHER Tylenol  OR Hydrocodone  APAP if needed for pain.  You may use  your Flonase  Nasal spray and your Albuterol  / Trelegy Ellipta inhalers if needed. Please bring your Albuterol  inhaler with you on the day of surgery.                    You may not have any metal on your body including hair pins, jewelry, and body piercing  Do not wear make-up, lotions, powders, perfumes  or deodorant  Do not wear nail polish including gel and S&S, artificial / acrylic nails, or any other type of covering on natural nails including finger and toenails. If you have artificial nails, gel coating, etc., that needs to be removed by a nail salon, Please have this removed prior to surgery. Not doing so may mean that your surgery could be cancelled or delayed if the Surgeon or anesthesia staff feels like they are unable to monitor you safely.   Do not shave 48 hours prior to surgery to avoid nicks in your skin which may contribute to postoperative infections.   Contacts, Hearing Aids, dentures or bridgework may not be worn into surgery. DENTURES WILL BE REMOVED PRIOR TO SURGERY PLEASE DO NOT APPLY Poly grip OR ADHESIVES!!!  You may bring a small  overnight bag with you on the day of surgery, only pack items that are not valuable. [facility] IS NOT RESPONSIBLE   FOR VALUABLES THAT ARE LOST OR STOLEN.   Patients discharged on the day of surgery will not be allowed to drive home.  Someone NEEDS to stay with you for the first 24 hours after anesthesia.  Special Instructions: Bring a copy of your healthcare power of attorney and living will documents the day of surgery, if you wish to have them scanned into your [facility]- EPIC  Please read over the following fact sheets you were given: IF YOU HAVE QUESTIONS ABOUT YOUR PRE-OP INSTRUCTIONS, PLEASE CALL [phone].     Pre-operative 5 CHG Bath Instructions   You can play a key role in reducing the risk of infection after surgery. Your skin needs to be as free of germs as possible. You can reduce the number of germs on your skin by washing with CHG (chlorhexidine  gluconate) soap before surgery. CHG is an antiseptic soap that kills germs and continues to kill germs even after washing.   DO NOT use if you have an allergy to chlorhexidine /CHG or antibacterial soaps. If your skin becomes reddened or irritated, stop using the CHG and notify one of our RNs at [phone]  Please shower with the CHG soap starting 4 days before surgery using the following schedule: START SHOWERS ON  [facility]  October 2, 20205  Please keep in mind the following:  DO NOT shave, including legs and underarms, starting the day of your first shower.   You may shave your face at any point before/day of surgery.   Place clean sheets on your bed the day you start using CHG soap. Use a clean washcloth (not used since being washed) for each shower. DO NOT sleep with pets once you start using the CHG.   CHG Shower Instructions:  If  you choose to wash your hair and private area, wash first with your normal shampoo/soap.  After you use shampoo/soap, rinse your hair and body thoroughly to remove shampoo/soap residue.  Turn the water OFF and apply about 3 tablespoons (45 ml) of CHG soap to a CLEAN washcloth.  Apply CHG soap ONLY FROM YOUR NECK DOWN TO YOUR TOES (washing for 3-5 minutes)  DO NOT use CHG soap on face, private areas, open wounds, or sores.  Pay special attention to the area where your surgery is being performed.  If you are having back surgery, having someone wash your back for you may be helpful.  Wait 2 minutes after CHG soap is applied, then you may rinse off the CHG soap.  Pat dry with a clean towel  Put on clean clothes/pajamas   If you choose to wear lotion, please use ONLY the CHG-compatible lotions on the back of this paper.     Additional instructions for the day of surgery: DO NOT APPLY any lotions, deodorants, cologne, or perfumes.   Put on clean/comfortable clothes.  Brush your teeth.  Ask your nurse before applying any prescription medications to the skin.      CHG Compatible Lotions   Aveeno Moisturizing lotion  Cetaphil Moisturizing Cream  Cetaphil Moisturizing Lotion  Clairol Herbal Essence Moisturizing Lotion, Dry Skin  Clairol Herbal Essence Moisturizing Lotion, Extra Dry Skin  Clairol Herbal Essence Moisturizing Lotion, Normal Skin  Curel Age Defying Therapeutic Moisturizing Lotion with Alpha Hydroxy  Curel Extreme Care Body Lotion  Curel Soothing Hands Moisturizing Hand Lotion  Curel Therapeutic Moisturizing Cream, Fragrance-Free  Curel Therapeutic Moisturizing Lotion, Fragrance-Free  Curel Therapeutic Moisturizing Lotion, Original Formula  Eucerin Daily Replenishing Lotion  Eucerin Dry Skin Therapy Plus Alpha Hydroxy Crme  Eucerin Dry Skin Therapy Plus Alpha Hydroxy Lotion  Eucerin Original Crme  Eucerin Original Lotion  Eucerin Plus Crme Eucerin Plus Lotion  Eucerin  TriLipid Replenishing Lotion  Keri Anti-Bacterial Hand Lotion  Keri Deep Conditioning Original Lotion Dry Skin Formula Softly Scented  Keri Deep Conditioning Original Lotion, Fragrance Free Sensitive Skin Formula  Keri Lotion Fast Absorbing Fragrance Free Sensitive Skin Formula  Keri Lotion Fast Absorbing Softly Scented Dry Skin Formula  Keri Original Lotion  Keri Skin Renewal Lotion Keri Silky Smooth Lotion  Keri Silky Smooth Sensitive Skin Lotion  Nivea Body Creamy Conditioning Oil  Nivea Body Extra Enriched Lotion  Nivea Body Original Lotion  Nivea Body Sheer Moisturizing Lotion Nivea Crme  Nivea Skin Firming Lotion  NutraDerm 30 Skin Lotion  NutraDerm Skin Lotion  NutraDerm Therapeutic Skin Cream  NutraDerm Therapeutic Skin Lotion  ProShield Protective Hand Cream  Provon moisturizing lotion   FAILURE TO FOLLOW THESE INSTRUCTIONS MAY RESULT IN THE CANCELLATION OF YOUR SURGERY  PATIENT SIGNATURE_________________________________  NURSE SIGNATURE__________________________________  ________________________________________________________________________         .Incentive Spirometer    An incentive spirometer is a tool that can help keep your lungs clear and active. This tool measures how well you are filling your lungs with each  breath. Taking long deep breaths may help reverse or decrease the chance of developing breathing (pulmonary) problems (especially infection) following: A long period of time when you are unable to move or be active. BEFORE THE PROCEDURE  If the spirometer includes an indicator to show your best effort, your nurse or respiratory therapist will set it to a desired goal. If possible, sit up straight or lean slightly forward. Try not to slouch. Hold the incentive spirometer in an upright position. INSTRUCTIONS FOR USE  Sit on the edge of your bed if possible, or sit up as far as you can in bed or on a chair. Hold the incentive spirometer in an  upright position. Breathe out normally. Place the mouthpiece in your mouth and seal your lips tightly around it. Breathe in slowly and as deeply as possible, raising the piston or the ball toward the top of the column. Hold your breath for 3-5 seconds or for as long as possible. Allow the piston or ball to fall to the bottom of the column. Remove the mouthpiece from your mouth and breathe out normally. Rest for a few seconds and repeat Steps 1 through 7 at least 10 times every 1-2 hours when you are awake. Take your time and take a few normal breaths between deep breaths. The spirometer may include an indicator to show your best effort. Use the indicator as a goal to work toward during each repetition. After each set of 10 deep breaths, practice coughing to be sure your lungs are clear. If you have an incision (the cut made at the time of surgery), support your incision when coughing by placing a pillow or rolled up towels firmly against it. Once you are able to get out of bed, walk around indoors and cough well. You may stop using the incentive spirometer when instructed by your caregiver.  RISKS AND COMPLICATIONS Take your time so you do not get dizzy or light-headed. If you are in pain, you may need to take or ask for pain medication before doing incentive spirometry. It is harder to take a deep breath if you are having pain. AFTER USE Rest and breathe slowly and easily. It can be helpful to keep track of a log of your progress. Your caregiver can provide you with a simple table to help with this. If you are using the spirometer at home, follow these instructions: SEEK MEDICAL CARE IF:  You are having difficultly using the spirometer. You have trouble using the spirometer as often as instructed. Your pain medication is not giving enough relief while using the spirometer. You develop fever of 100.5 F (38.1 C) or higher.                                                                                                     SEEK IMMEDIATE MEDICAL CARE IF:  You cough up bloody sputum that had not been present before. You develop fever of 102 F (38.9 C) or greater. You develop worsening pain at or near the incision site. MAKE SURE YOU:  Understand these instructions. Will watch  your condition. Will get help right away if you are not doing well or get worse. Document Released: 12/05/2006 Document Revised: 10/17/2011 Document Reviewed: 02/05/2007 [facility] Patient Information 2014 [place], [place].        If you would like to see a video about joint replacement:   [email or web address]

## 2024-04-30 NOTE — Progress Notes (Signed)
 COVID Vaccine received:  [x]  No []  Yes Date of any COVID positive Test in last 90 days:  None  PCP - Bernardino Pinal, PA at [facility]  Cardiologist -  None  Chest x-ray - 09-21-2021  2v  Epic EKG - 2023   repeated 05-01-24 Stress Test -  ECHO -  Cardiac Cath -  CT Coronary Calcium  score:   Pacemaker / ICD device [x]  No []  Yes   Spinal Cord Stimulator:[x]  No []  Yes       History of Sleep Apnea? []  No [x]  Yes  study 06-2021 CPAP used?- [x]  No []  Yes    Patient has: []  NO Hx DM   [x]  Pre-DM   []  DM1  []   DM2 Does the patient monitor blood sugar?   []  N/A   [x]  No []  Yes  Last A1c was: 6.2  on  07-13-2021      Blood Thinner / Instructions:   none Aspirin  Instructions:  none  Dental hx: []  Dentures:  []  N/A      []  Bridge or Partial:                   [x]  Loose or Damaged teeth:   Activity level: Able to walk up 2 flights of stairs without becoming significantly short of breath or having chest pain?  [x]  No   []    Yes  Patient can perform ADLs without assistance. 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  Patient verbalized understanding and agreement to the Pre-Surgical Instructions that were given to them at this PAT appointment. Patient was also educated of the need to review these PAT instructions again prior to her surgery.I reviewed the appropriate phone numbers to call if they have any and questions or concerns.

## 2024-05-01 ENCOUNTER — Other Ambulatory Visit: Payer: Self-pay | Admitting: Orthopedic Surgery

## 2024-05-01 ENCOUNTER — Encounter (HOSPITAL_COMMUNITY)
Admission: RE | Admit: 2024-05-01 | Discharge: 2024-05-01 | Disposition: A | Discharge status: Home / Self Care | Source: Ambulatory Visit | Attending: Orthopedic Surgery | Admitting: Orthopedic Surgery

## 2024-05-01 VITALS — BP 180/108 | HR 64 | Temp 98.2°F | Resp 18 | Ht 64.0 in | Wt 180.5 lb

## 2024-05-01 DIAGNOSIS — R7303 Prediabetes: Secondary | ICD-10-CM | POA: Diagnosis not present

## 2024-05-01 DIAGNOSIS — M1711 Unilateral primary osteoarthritis, right knee: Secondary | ICD-10-CM | POA: Insufficient documentation

## 2024-05-01 DIAGNOSIS — Z01818 Encounter for other preprocedural examination: Secondary | ICD-10-CM | POA: Diagnosis present

## 2024-05-01 DIAGNOSIS — F1210 Cannabis abuse, uncomplicated: Secondary | ICD-10-CM | POA: Insufficient documentation

## 2024-05-01 DIAGNOSIS — F1010 Alcohol abuse, uncomplicated: Secondary | ICD-10-CM | POA: Insufficient documentation

## 2024-05-01 DIAGNOSIS — G8929 Other chronic pain: Secondary | ICD-10-CM

## 2024-05-01 DIAGNOSIS — I10 Essential (primary) hypertension: Secondary | ICD-10-CM | POA: Insufficient documentation

## 2024-05-01 LAB — COMPREHENSIVE METABOLIC PANEL WITH GFR
ALT: 16 U/L (ref 0–44)
AST: 28 U/L (ref 15–41)
Albumin: 4 g/dL (ref 3.5–5.0)
Alkaline Phosphatase: 59 U/L (ref 38–126)
Anion gap: 11 (ref 5–15)
BUN: 13 mg/dL (ref 6–20)
CO2: 25 mmol/L (ref 22–32)
Calcium: 9.3 mg/dL (ref 8.9–10.3)
Chloride: 103 mmol/L (ref 98–111)
Creatinine, Ser: 0.87 mg/dL (ref 0.44–1.00)
GFR, Estimated: >60 mL/min (ref >60)
Glucose, Bld: 94 mg/dL (ref 70–99)
Potassium: 4.1 mmol/L (ref 3.5–5.1)
Sodium: 139 mmol/L (ref 135–145)
Total Bilirubin: 0.4 mg/dL (ref 0.0–1.2)
Total Protein: 7.2 g/dL (ref 6.5–8.1)

## 2024-05-01 LAB — CBC
HCT: 37.2 % (ref 36.0–46.0)
Hemoglobin: 12.1 g/dL (ref 12.0–15.0)
MCH: 31.1 pg (ref 26.0–34.0)
MCHC: 32.5 g/dL (ref 30.0–36.0)
MCV: 95.6 fL (ref 80.0–100.0)
Platelets: 331 K/uL (ref 150–400)
RBC: 3.89 MIL/uL (ref 3.87–5.11)
RDW: 13.1 % (ref 11.5–15.5)
WBC: 5.7 K/uL (ref 4.0–10.5)
nRBC: 0 % (ref 0.0–0.2)

## 2024-05-01 LAB — SURGICAL PCR SCREEN
MRSA, PCR: NEGATIVE
Staphylococcus aureus: POSITIVE — AB

## 2024-05-02 ENCOUNTER — Other Ambulatory Visit: Payer: Self-pay

## 2024-05-02 NOTE — Progress Notes (Signed)
 Patient's PCR screen is positive for STAPH. Appropriate notes have been placed on the patient's chart. This note has been routed to Dr.  Rubie and Laurell  for review. The Patient's surgery is currently scheduled for: 05-13-24 at [facility].  Shawnee Aloe, BSN, CVRN-BC   Pre-Surgical Testing Nurse [facility]  [phone]

## 2024-05-13 ENCOUNTER — Ambulatory Visit (HOSPITAL_COMMUNITY)
Admission: RE | Admit: 2024-05-13 | Discharge: 2024-05-13 | Disposition: A | Discharge status: Home / Self Care | Payer: Self-pay | Attending: Orthopedic Surgery | Admitting: Orthopedic Surgery

## 2024-05-13 ENCOUNTER — Ambulatory Visit (HOSPITAL_COMMUNITY): Payer: Self-pay | Admitting: Physician Assistant

## 2024-05-13 ENCOUNTER — Ambulatory Visit (HOSPITAL_COMMUNITY): Payer: Self-pay | Admitting: Anesthesiology

## 2024-05-13 ENCOUNTER — Encounter (HOSPITAL_COMMUNITY): Payer: Self-pay | Admitting: Orthopedic Surgery

## 2024-05-13 ENCOUNTER — Encounter (HOSPITAL_COMMUNITY)
Admission: RE | Disposition: A | Discharge status: Home / Self Care | Payer: Self-pay | Source: Home / Self Care | Attending: Orthopedic Surgery

## 2024-05-13 DIAGNOSIS — F319 Bipolar disorder, unspecified: Secondary | ICD-10-CM | POA: Diagnosis not present

## 2024-05-13 DIAGNOSIS — F17210 Nicotine dependence, cigarettes, uncomplicated: Secondary | ICD-10-CM

## 2024-05-13 DIAGNOSIS — I10 Essential (primary) hypertension: Secondary | ICD-10-CM | POA: Diagnosis not present

## 2024-05-13 DIAGNOSIS — I2510 Atherosclerotic heart disease of native coronary artery without angina pectoris: Secondary | ICD-10-CM

## 2024-05-13 DIAGNOSIS — M1711 Unilateral primary osteoarthritis, right knee: Secondary | ICD-10-CM

## 2024-05-13 DIAGNOSIS — J4489 Other specified chronic obstructive pulmonary disease: Secondary | ICD-10-CM | POA: Diagnosis not present

## 2024-05-13 DIAGNOSIS — Z79899 Other long term (current) drug therapy: Secondary | ICD-10-CM | POA: Insufficient documentation

## 2024-05-13 DIAGNOSIS — G8929 Other chronic pain: Secondary | ICD-10-CM

## 2024-05-13 DIAGNOSIS — J449 Chronic obstructive pulmonary disease, unspecified: Secondary | ICD-10-CM

## 2024-05-13 DIAGNOSIS — Z7951 Long term (current) use of inhaled steroids: Secondary | ICD-10-CM | POA: Insufficient documentation

## 2024-05-13 DIAGNOSIS — Z91199 Patient's noncompliance with other medical treatment and regimen due to unspecified reason: Secondary | ICD-10-CM | POA: Insufficient documentation

## 2024-05-13 HISTORY — PX: TOTAL KNEE ARTHROPLASTY: SHX125

## 2024-05-13 LAB — CBC WITH DIFFERENTIAL/PLATELET
Abs Immature Granulocytes: 0.02 K/uL (ref 0.00–0.07)
Basophils Absolute: 0 K/uL (ref 0.0–0.1)
Basophils Relative: 0 %
Eosinophils Absolute: 0.1 K/uL (ref 0.0–0.5)
Eosinophils Relative: 2 %
HCT: 33.3 % — ABNORMAL LOW (ref 36.0–46.0)
Hemoglobin: 10.8 g/dL — ABNORMAL LOW (ref 12.0–15.0)
Immature Granulocytes: 0 %
Lymphocytes Relative: 56 %
Lymphs Abs: 2.9 K/uL (ref 0.7–4.0)
MCH: 30.2 pg (ref 26.0–34.0)
MCHC: 32.4 g/dL (ref 30.0–36.0)
MCV: 93 fL (ref 80.0–100.0)
Monocytes Absolute: 0.2 K/uL (ref 0.1–1.0)
Monocytes Relative: 3 %
Neutro Abs: 2 K/uL (ref 1.7–7.7)
Neutrophils Relative %: 39 %
Platelets: 255 K/uL (ref 150–400)
RBC: 3.58 MIL/uL — ABNORMAL LOW (ref 3.87–5.11)
RDW: 13.4 % (ref 11.5–15.5)
WBC: 5.2 K/uL (ref 4.0–10.5)
nRBC: 0 % (ref 0.0–0.2)

## 2024-05-13 LAB — COMPREHENSIVE METABOLIC PANEL WITH GFR
ALT: 16 U/L (ref 0–44)
AST: 35 U/L (ref 15–41)
Albumin: 3.9 g/dL (ref 3.5–5.0)
Alkaline Phosphatase: 52 U/L (ref 38–126)
Anion gap: 14 (ref 5–15)
BUN: 9 mg/dL (ref 6–20)
CO2: 20 mmol/L — ABNORMAL LOW (ref 22–32)
Calcium: 9.2 mg/dL (ref 8.9–10.3)
Chloride: 102 mmol/L (ref 98–111)
Creatinine, Ser: 0.93 mg/dL (ref 0.44–1.00)
GFR, Estimated: >60 mL/min (ref >60)
Glucose, Bld: 110 mg/dL — ABNORMAL HIGH (ref 70–99)
Potassium: 4.1 mmol/L (ref 3.5–5.1)
Sodium: 135 mmol/L (ref 135–145)
Total Bilirubin: 0.4 mg/dL (ref 0.0–1.2)
Total Protein: 6.9 g/dL (ref 6.5–8.1)

## 2024-05-13 SURGERY — ARTHROPLASTY, KNEE, TOTAL
Anesthesia: Regional | Site: Knee | Laterality: Right

## 2024-05-13 MED ORDER — WATER FOR IRRIGATION, STERILE IR SOLN
Status: DC | PRN
  Administered 2024-05-13: 2000 mL

## 2024-05-13 MED ORDER — LIDOCAINE HCL (CARDIAC) PF 100 MG/5ML IV SOSY
PREFILLED_SYRINGE | INTRAVENOUS | Status: DC | PRN
  Administered 2024-05-13: 15 mg via INTRAVENOUS

## 2024-05-13 MED ORDER — DEXAMETHASONE SODIUM PHOSPHATE 10 MG/ML IJ SOLN
INTRAMUSCULAR | Status: AC
  Filled 2024-05-13: qty 1

## 2024-05-13 MED ORDER — SODIUM CHLORIDE 0.9 % IR SOLN
Status: DC | PRN
  Administered 2024-05-13: 1000 mL

## 2024-05-13 MED ORDER — PHENYLEPHRINE 80 MCG/ML (10ML) SYRINGE FOR IV PUSH (FOR BLOOD PRESSURE SUPPORT)
PREFILLED_SYRINGE | INTRAVENOUS | Status: AC
Start: 2024-05-13 — End: 2024-05-13
  Filled 2024-05-13: qty 10

## 2024-05-13 MED ORDER — ONDANSETRON HCL 4 MG/2ML IJ SOLN
4.0000 mg | Freq: Four times a day (QID) | INTRAMUSCULAR | Status: DC | PRN
  Administered 2024-05-13: 4 mg via INTRAVENOUS

## 2024-05-13 MED ORDER — HYDROMORPHONE HCL 1 MG/ML IJ SOLN
INTRAMUSCULAR | Status: AC
  Filled 2024-05-13: qty 1

## 2024-05-13 MED ORDER — LACTATED RINGERS IV BOLUS
500.0000 mL | Freq: Once | INTRAVENOUS | Status: AC
  Administered 2024-05-13: 500 mL via INTRAVENOUS

## 2024-05-13 MED ORDER — SUGAMMADEX SODIUM 200 MG/2ML IV SOLN
INTRAVENOUS | Status: DC | PRN
  Administered 2024-05-13: 200 mg via INTRAVENOUS

## 2024-05-13 MED ORDER — DEXAMETHASONE SODIUM PHOSPHATE 10 MG/ML IJ SOLN
INTRAMUSCULAR | Status: DC | PRN
  Administered 2024-05-13: 10 mg via PERINEURAL

## 2024-05-13 MED ORDER — SUGAMMADEX SODIUM 200 MG/2ML IV SOLN
INTRAVENOUS | Status: AC
  Filled 2024-05-13: qty 2

## 2024-05-13 MED ORDER — ORAL CARE MOUTH RINSE: 15.0000 mL | Freq: Once | OROMUCOSAL | Status: AC

## 2024-05-13 MED ORDER — LACTATED RINGERS IV SOLN: INTRAVENOUS | Status: DC

## 2024-05-13 MED ORDER — BUPIVACAINE-EPINEPHRINE (PF) 0.25% -1:200000 IJ SOLN
INTRAMUSCULAR | Status: AC
  Filled 2024-05-13: qty 30

## 2024-05-13 MED ORDER — KETAMINE HCL 50 MG/5ML IJ SOSY
PREFILLED_SYRINGE | INTRAMUSCULAR | Status: DC | PRN
Start: 2024-05-13 — End: 2024-05-13
  Administered 2024-05-13: 30 mg via INTRAVENOUS
  Administered 2024-05-13: 20 mg via INTRAVENOUS

## 2024-05-13 MED ORDER — OXYCODONE HCL 5 MG PO TABS: 5.0000 mg | ORAL_TABLET | ORAL | 0 refills | Status: AC | PRN

## 2024-05-13 MED ORDER — TRANEXAMIC ACID-NACL 1000-0.7 MG/100ML-% IV SOLN
1000.0000 mg | INTRAVENOUS | Status: AC
Start: 2024-05-13 — End: 2024-05-13
  Administered 2024-05-13: 1000 mg via INTRAVENOUS
  Filled 2024-05-13: qty 100

## 2024-05-13 MED ORDER — SODIUM CHLORIDE (PF) 0.9 % IJ SOLN
INTRAMUSCULAR | Status: AC
  Filled 2024-05-13: qty 10

## 2024-05-13 MED ORDER — METOCLOPRAMIDE HCL 5 MG/ML IJ SOLN: 5.0000 mg | Freq: Three times a day (TID) | INTRAMUSCULAR | Status: DC | PRN

## 2024-05-13 MED ORDER — GABAPENTIN 300 MG PO CAPS
300.0000 mg | ORAL_CAPSULE | Freq: Once | ORAL | Status: AC
  Administered 2024-05-13: 300 mg via ORAL
  Filled 2024-05-13: qty 1

## 2024-05-13 MED ORDER — AMISULPRIDE (ANTIEMETIC) 5 MG/2ML IV SOLN
INTRAVENOUS | Status: AC
  Filled 2024-05-13: qty 4

## 2024-05-13 MED ORDER — GLYCOPYRROLATE 0.2 MG/ML IJ SOLN
INTRAMUSCULAR | Status: DC | PRN
  Administered 2024-05-13: .2 mg via INTRAVENOUS

## 2024-05-13 MED ORDER — MIDAZOLAM HCL 2 MG/2ML IJ SOLN
1.0000 mg | INTRAMUSCULAR | Status: DC
  Administered 2024-05-13: 2 mg via INTRAVENOUS
  Filled 2024-05-13: qty 2

## 2024-05-13 MED ORDER — ONDANSETRON HCL 4 MG/2ML IJ SOLN
INTRAMUSCULAR | Status: AC
  Filled 2024-05-13: qty 2

## 2024-05-13 MED ORDER — GLYCOPYRROLATE 0.2 MG/ML IJ SOLN
INTRAMUSCULAR | Status: AC
Start: 2024-05-13 — End: 2024-05-13
  Filled 2024-05-13: qty 1

## 2024-05-13 MED ORDER — ROPIVACAINE HCL 5 MG/ML IJ SOLN
INTRAMUSCULAR | Status: DC | PRN
Start: 2024-05-13 — End: 2024-05-13
  Administered 2024-05-13: 30 mL via PERINEURAL

## 2024-05-13 MED ORDER — LIDOCAINE HCL (PF) 2 % IJ SOLN
INTRAMUSCULAR | Status: AC
  Filled 2024-05-13: qty 5

## 2024-05-13 MED ORDER — METHOCARBAMOL 500 MG PO TABS: 500.0000 mg | ORAL_TABLET | Freq: Four times a day (QID) | ORAL | 0 refills | Status: AC

## 2024-05-13 MED ORDER — ACETAMINOPHEN 500 MG PO TABS: 1000.0000 mg | ORAL_TABLET | Freq: Once | ORAL | Status: DC

## 2024-05-13 MED ORDER — CHLORHEXIDINE GLUCONATE 0.12 % MT SOLN
15.0000 mL | Freq: Once | OROMUCOSAL | Status: AC
  Administered 2024-05-13: 15 mL via OROMUCOSAL

## 2024-05-13 MED ORDER — SODIUM CHLORIDE (PF) 0.9 % IJ SOLN
INTRAMUSCULAR | Status: DC | PRN
  Administered 2024-05-13: 70 mL

## 2024-05-13 MED ORDER — DEXAMETHASONE SODIUM PHOSPHATE 10 MG/ML IJ SOLN
8.0000 mg | Freq: Once | INTRAMUSCULAR | Status: AC
  Administered 2024-05-13: 8 mg via INTRAVENOUS

## 2024-05-13 MED ORDER — FENTANYL CITRATE PF 50 MCG/ML IJ SOSY
50.0000 ug | PREFILLED_SYRINGE | INTRAMUSCULAR | Status: DC
  Administered 2024-05-13: 100 ug via INTRAVENOUS
  Filled 2024-05-13: qty 2

## 2024-05-13 MED ORDER — KETAMINE HCL 50 MG/5ML IJ SOSY
PREFILLED_SYRINGE | INTRAMUSCULAR | Status: AC
Start: 2024-05-13 — End: 2024-05-13
  Filled 2024-05-13: qty 5

## 2024-05-13 MED ORDER — HYDROMORPHONE HCL 1 MG/ML IJ SOLN
INTRAMUSCULAR | Status: DC | PRN
  Administered 2024-05-13 (×3): .2 mg via INTRAVENOUS
  Administered 2024-05-13 (×2): .5 mg via INTRAVENOUS
  Administered 2024-05-13: .4 mg via INTRAVENOUS

## 2024-05-13 MED ORDER — DOCUSATE SODIUM 100 MG PO CAPS: 100.0000 mg | ORAL_CAPSULE | Freq: Every day | ORAL | 2 refills | Status: AC | PRN

## 2024-05-13 MED ORDER — BUPIVACAINE LIPOSOME 1.3 % IJ SUSP
INTRAMUSCULAR | Status: AC
  Filled 2024-05-13: qty 20

## 2024-05-13 MED ORDER — CEFAZOLIN SODIUM-DEXTROSE 2-4 GM/100ML-% IV SOLN
2.0000 g | INTRAVENOUS | Status: AC
  Administered 2024-05-13: 2 g via INTRAVENOUS
  Filled 2024-05-13: qty 100

## 2024-05-13 MED ORDER — AMISULPRIDE (ANTIEMETIC) 5 MG/2ML IV SOLN
10.0000 mg | Freq: Once | INTRAVENOUS | Status: AC | PRN
  Administered 2024-05-13: 10 mg via INTRAVENOUS

## 2024-05-13 MED ORDER — PROPOFOL 1000 MG/100ML IV EMUL
INTRAVENOUS | Status: AC
  Filled 2024-05-13: qty 100

## 2024-05-13 MED ORDER — OXYCODONE HCL 5 MG PO TABS: 5.0000 mg | ORAL_TABLET | ORAL | Status: DC | PRN

## 2024-05-13 MED ORDER — ACETAMINOPHEN 500 MG PO TABS: 1000.0000 mg | ORAL_TABLET | Freq: Four times a day (QID) | ORAL | Status: DC

## 2024-05-13 MED ORDER — OXYCODONE HCL 5 MG PO TABS
ORAL_TABLET | ORAL | Status: AC
  Filled 2024-05-13: qty 1

## 2024-05-13 MED ORDER — POVIDONE-IODINE 10 % EX SWAB
2.0000 | Freq: Once | CUTANEOUS | Status: AC
  Administered 2024-05-13: 2 via TOPICAL

## 2024-05-13 MED ORDER — HYDROMORPHONE HCL 2 MG/ML IJ SOLN
INTRAMUSCULAR | Status: AC
  Filled 2024-05-13: qty 1

## 2024-05-13 MED ORDER — 0.9 % SODIUM CHLORIDE (POUR BTL) OPTIME
TOPICAL | Status: DC | PRN
  Administered 2024-05-13: 1000 mL

## 2024-05-13 MED ORDER — FENTANYL CITRATE (PF) 100 MCG/2ML IJ SOLN
INTRAMUSCULAR | Status: DC | PRN
  Administered 2024-05-13 (×2): 50 ug via INTRAVENOUS

## 2024-05-13 MED ORDER — ROCURONIUM BROMIDE 10 MG/ML (PF) SYRINGE
PREFILLED_SYRINGE | INTRAVENOUS | Status: AC
  Filled 2024-05-13: qty 10

## 2024-05-13 MED ORDER — PROPOFOL 500 MG/50ML IV EMUL
INTRAVENOUS | Status: DC | PRN
  Administered 2024-05-13: 150 mg via INTRAVENOUS
  Administered 2024-05-13: 50 ug/kg/min via INTRAVENOUS

## 2024-05-13 MED ORDER — ONDANSETRON HCL 4 MG/2ML IJ SOLN
INTRAMUSCULAR | Status: DC | PRN
  Administered 2024-05-13: 4 mg via INTRAVENOUS

## 2024-05-13 MED ORDER — ROCURONIUM BROMIDE 100 MG/10ML IV SOLN
INTRAVENOUS | Status: DC | PRN
  Administered 2024-05-13: 50 mg via INTRAVENOUS

## 2024-05-13 MED ORDER — HYDROMORPHONE HCL 1 MG/ML IJ SOLN: 0.5000 mg | INTRAMUSCULAR | Status: DC | PRN

## 2024-05-13 MED ORDER — METOCLOPRAMIDE HCL 5 MG PO TABS: 5.0000 mg | ORAL_TABLET | Freq: Three times a day (TID) | ORAL | Status: DC | PRN

## 2024-05-13 MED ORDER — OXYCODONE HCL 5 MG/5ML PO SOLN: 5.0000 mg | Freq: Once | ORAL | Status: DC | PRN

## 2024-05-13 MED ORDER — LACTATED RINGERS IV BOLUS: 250.0000 mL | Freq: Once | INTRAVENOUS | Status: DC

## 2024-05-13 MED ORDER — CHLORHEXIDINE GLUCONATE 4 % EX SOLN: 1.0000 | CUTANEOUS | 1 refills | Status: AC

## 2024-05-13 MED ORDER — MUPIROCIN 2 % EX OINT: 1.0000 | TOPICAL_OINTMENT | Freq: Two times a day (BID) | CUTANEOUS | 0 refills | Status: AC

## 2024-05-13 MED ORDER — ONDANSETRON HCL 4 MG PO TABS: 4.0000 mg | ORAL_TABLET | Freq: Every day | ORAL | 1 refills | Status: AC | PRN

## 2024-05-13 MED ORDER — ONDANSETRON HCL 4 MG PO TABS: 4.0000 mg | ORAL_TABLET | Freq: Four times a day (QID) | ORAL | Status: DC | PRN

## 2024-05-13 MED ORDER — OXYCODONE HCL 5 MG PO TABS: 5.0000 mg | ORAL_TABLET | Freq: Once | ORAL | Status: DC | PRN

## 2024-05-13 MED ORDER — HYDROMORPHONE HCL 1 MG/ML IJ SOLN
0.2500 mg | INTRAMUSCULAR | Status: DC | PRN
  Administered 2024-05-13 (×4): 0.5 mg via INTRAVENOUS

## 2024-05-13 MED ORDER — SODIUM CHLORIDE (PF) 0.9 % IJ SOLN
INTRAMUSCULAR | Status: AC
  Filled 2024-05-13: qty 20

## 2024-05-13 MED ORDER — PHENYLEPHRINE 80 MCG/ML (10ML) SYRINGE FOR IV PUSH (FOR BLOOD PRESSURE SUPPORT)
PREFILLED_SYRINGE | INTRAVENOUS | Status: DC | PRN
  Administered 2024-05-13: 40 ug via INTRAVENOUS
  Administered 2024-05-13: 120 ug via INTRAVENOUS
  Administered 2024-05-13: 40 ug via INTRAVENOUS
  Administered 2024-05-13: 80 ug via INTRAVENOUS
  Administered 2024-05-13: 120 ug via INTRAVENOUS

## 2024-05-13 MED ORDER — FENTANYL CITRATE (PF) 100 MCG/2ML IJ SOLN
INTRAMUSCULAR | Status: AC
  Filled 2024-05-13: qty 2

## 2024-05-13 MED ORDER — OXYCODONE HCL 5 MG PO TABS: 10.0000 mg | ORAL_TABLET | ORAL | Status: DC | PRN

## 2024-05-13 MED ORDER — BUPIVACAINE LIPOSOME 1.3 % IJ SUSP: 20.0000 mL | Freq: Once | INTRAMUSCULAR | Status: DC

## 2024-05-13 SURGICAL SUPPLY — 45 items
BAG COUNTER SPONGE SURGICOUNT (BAG) IMPLANT
BAG ZIPLOCK 12X15 (MISCELLANEOUS) ×1 IMPLANT
BLADE SAGITTAL 13X1.27X60 (BLADE) ×1 IMPLANT
BLADE SAW SGTL 18X1.27X75 (BLADE) ×1 IMPLANT
BLADE SURG 15 STRL LF DISP TIS (BLADE) ×1 IMPLANT
BNDG ELASTIC 6INX 5YD STR LF (GAUZE/BANDAGES/DRESSINGS) ×1 IMPLANT
BOWL SMART MIX CTS (DISPOSABLE) ×1 IMPLANT
CEMENT BONE R 1X40 (Cement) ×2 IMPLANT
COVER SURGICAL LIGHT HANDLE (MISCELLANEOUS) ×1 IMPLANT
CUFF TRNQT CYL 34X4.125X (TOURNIQUET CUFF) ×1 IMPLANT
DRAPE INCISE IOBAN 66X45 STRL (DRAPES) ×2 IMPLANT
DRAPE U-SHAPE 47X51 STRL (DRAPES) ×1 IMPLANT
DRSG AQUACEL AG ADV 3.5X10 (GAUZE/BANDAGES/DRESSINGS) ×1 IMPLANT
DURAPREP 26ML APPLICATOR (WOUND CARE) ×2 IMPLANT
ELECT PENCIL ROCKER SW 15FT (MISCELLANEOUS) ×1 IMPLANT
ELECT REM PT RETURN 15FT ADLT (MISCELLANEOUS) ×1 IMPLANT
FEMUR CMTD CCR STD SZ6 R KNEE (Knees) IMPLANT
GLOVE BIOGEL M 7.0 STRL (GLOVE) IMPLANT
GLOVE BIOGEL PI IND STRL 7.5 (GLOVE) IMPLANT
GLOVE BIOGEL PI IND STRL 8.5 (GLOVE) ×1 IMPLANT
GLOVE SURG ORTHO 8.0 STRL STRW (GLOVE) ×2 IMPLANT
GOWN STRL REUS W/ TWL XL LVL3 (GOWN DISPOSABLE) ×2 IMPLANT
HOLDER FOLEY CATH W/STRAP (MISCELLANEOUS) ×1 IMPLANT
HOOD PEEL AWAY T7 (MISCELLANEOUS) ×3 IMPLANT
INSERT TIBIAL 13 PLY R 6-9CD (Joint) IMPLANT
KIT TURNOVER KIT A (KITS) ×1 IMPLANT
MANIFOLD NEPTUNE II (INSTRUMENTS) ×1 IMPLANT
NS IRRIG 1000ML POUR BTL (IV SOLUTION) ×1 IMPLANT
PACK TOTAL KNEE CUSTOM (KITS) ×1 IMPLANT
PROTECTOR NERVE ULNAR (MISCELLANEOUS) ×1 IMPLANT
SET HNDPC FAN SPRY TIP SCT (DISPOSABLE) ×1 IMPLANT
SPIKE FLUID TRANSFER (MISCELLANEOUS) ×2 IMPLANT
STEM POLY PAT PLY 32M KNEE (Knees) IMPLANT
STEM TIBIA 5 DEG SZ C R KNEE (Knees) IMPLANT
STRIP CLOSURE SKIN 1/2X4 (GAUZE/BANDAGES/DRESSINGS) ×1 IMPLANT
SUT BONE WAX W31G (SUTURE) ×1 IMPLANT
SUT MNCRL AB 3-0 PS2 18 (SUTURE) ×1 IMPLANT
SUT STRATAFIX 1PDS 45CM VIOLET (SUTURE) ×1 IMPLANT
SUT VIC AB 0 CT1 36 (SUTURE) ×1 IMPLANT
SUT VIC AB 1 CT1 36 (SUTURE) ×1 IMPLANT
SUTURE STRATFX 0 PDS 27 VIOLET (SUTURE) ×1 IMPLANT
TRAY FOLEY MTR SLVR 16FR STAT (SET/KITS/TRAYS/PACK) ×1 IMPLANT
TUBE SUCTION HIGH CAP CLEAR NV (SUCTIONS) ×1 IMPLANT
WATER STERILE IRR 1000ML POUR (IV SOLUTION) ×2 IMPLANT
WRAP KNEE MAXI GEL POST OP (GAUZE/BANDAGES/DRESSINGS) ×1 IMPLANT

## 2024-05-13 NOTE — H&P (Signed)
 SABRA

## 2024-05-13 NOTE — Anesthesia Postprocedure Evaluation (Signed)
 Anesthesia Post Note  Patient: [name] [name]  Procedure(s) Performed: RIGHT TOTAL KNEE ARTHROPLASTY (Right: Knee)     Patient location during evaluation: PACU Anesthesia Type: General Level of consciousness: awake and alert Pain management: pain level controlled Vital Signs Assessment: post-procedure vital signs reviewed and stable Respiratory status: spontaneous breathing, nonlabored ventilation and respiratory function stable Cardiovascular status: blood pressure returned to baseline Postop Assessment: no apparent nausea or vomiting Anesthetic complications: no   No notable events documented.  Last Vitals:  Vitals:   05/13/24 1330 05/13/24 1345  BP: (!) 163/106 (!) 173/107  Pulse: 100 64  Resp: 18 13  Temp:    SpO2: 97% 97%    Last Pain:  Vitals:   05/13/24 1345  TempSrc:   PainSc: 5                  Vertell Row

## 2024-05-13 NOTE — Progress Notes (Signed)
 Orthopedic Tech Progress Note Patient Details:  [name] [name] 1966/07/15 [id number] Applied bone foam per order.  Ortho Devices Type of Ortho Device: Bone foam zero knee Ortho Device/Splint Location: RLE Ortho Device/Splint Interventions: Ordered, Application, Adjustment   Post Interventions Patient Tolerated: Well Instructions Provided: Adjustment of device, Care of device, Poper ambulation with device  Morna Pink 05/13/2024, 4:01 PM

## 2024-05-13 NOTE — Anesthesia Procedure Notes (Signed)
 Anesthesia Regional Block: Adductor canal block   Pre-Anesthetic Checklist: , timeout performed,  Correct Patient, Correct Site, Correct Laterality,  Correct Procedure, Correct Position, site marked,  Risks and benefits discussed,  Pre-op evaluation,  At surgeon's request and post-op pain management  Laterality: Right  Prep: Maximum Sterile Barrier Precautions used, chloraprep       Needles:  Injection technique: Single-shot  Needle Type: Echogenic Stimulator Needle     Needle Length: 9cm  Needle Gauge: 21     Additional Needles:   Procedures:,,,, ultrasound used (permanent image in chart),,    Narrative:  Start time: 05/13/2024 9:38 AM End time: 05/13/2024 9:41 AM Injection made incrementally with aspirations every 5 mL. Anesthesiologist: Niels Marien CROME, MD

## 2024-05-13 NOTE — Op Note (Signed)
 TOTAL KNEE REPLACEMENT OPERATIVE NOTE:  05/13/2024  1:38 PM  PATIENT:  Corean LITTIE Pizza  58 y.o. female  PRE-OPERATIVE DIAGNOSIS:  Primary osteoarthritis of right knee M17.11  POST-OPERATIVE DIAGNOSIS:  Primary osteoarthritis of right knee M17.11  PROCEDURE:  Procedure(s): RIGHT TOTAL KNEE ARTHROPLASTY  SURGEON:  Surgeon(s): Rubie Kemps, MD  PHYSICIAN ASSISTANT: Almeda Rummer, PA-C  ANESTHESIA:   general  SPECIMEN: None  COUNTS:  Correct  TOURNIQUET:   Total Tourniquet Time Documented: Thigh (Right) - 43 minutes Total: Thigh (Right) - 43 minutes   DICTATION:  Indication for procedure:    The patient is a 58 y.o. female who has failed conservative treatment for Primary osteoarthritis of right knee M17.11.  Informed consent was obtained prior to anesthesia. The risks versus benefits of the operation were explain and in a way the patient can, and did, understand.   On the implant demand matching protocol, this patient scored 10.  Therefore, this patient was not receive a polyethylene insert with vitamin E which is a high demand implant.  Description of procedure:     The patient was taken to the operating room and placed under anesthesia.  The patient was positioned in the usual fashion taking care that all body parts were adequately padded and/or protected.  A tourniquet was applied and the leg prepped and draped in the usual sterile fashion.  The extremity was exsanguinated with the esmarch and tourniquet inflated to 350 mmHg.  Pre-operative range of motion was normal.  The knee was in 4 degree of mild varus.  A midline incision approximately 6-7 inches long was made with a #10 blade.  A new blade was used to make a parapatellar arthrotomy going 2-3 cm into the quadriceps tendon, over the patella, and alongside the medial aspect of the patellar tendon.  A synovectomy was then performed with the #10 blade and forceps. I then elevated the deep MCL off the medial tibial  metaphysis subperiosteally around to the semimembranosus attachment.    I everted the patella and used calipers to measure patellar thickness.  I used the reamer to ream down to appropriate thickness to recreate the native thickness.  I then removed excess bone with the rongeur and sagittal saw.  I used the appropriately sized template and drilled the three lug holes.  I then put the trial in place and measured the thickness with the calipers to ensure recreation of the native thickness.  The trial was then removed and the patella subluxed and the knee brought into flexion.  A homan retractor was place to retract and protect the patella and lateral structures.  A Z-retractor was place medially to protect the medial structures.  The extra-medullary alignment system was used to make cut the tibial articular surface perpendicular to the anamotic axis of the tibia and in 3 degrees of posterior slope.  The cut surface and alignment jig was removed.  I then used the intramedullary alignment guide to make a 5 valgus cut on the distal femur.  I then marked out the epicondylar axis on the distal femur.  The posterior condylar axis measured 5 degrees.  I then used the anterior referencing sizer and measured the femur to be a size 6.  The 4-In-1 cutting block was screwed into place in external rotation matching the posterior condylar angle, making our cuts perpendicular to the epicondylar axis.  Anterior, posterior and chamfer cuts were made with the sagittal saw.  The cutting block and cut pieces were removed.  A  lamina spreader was placed in 90 degrees of flexion.  The ACL, PCL, menisci, and posterior condylar osteophytes were removed.  A 13 mm spacer blocked was found to offer good flexion and extension gap balance after mild in degree releasing.   The scoop retractor was then placed and the femoral finishing block was pinned in place.  The small sagittal saw was used as well as the lug drill to finish the femur.   The block and cut surfaces were removed and the medullary canal hole filled with autograft bone from the cut pieces.  The tibia was delivered forward in deep flexion and external rotation.  A size C tray was selected and pinned into place centered on the medial 1/3 of the tibial tubercle.  The reamer and keel was used to prepare the tibia through the tray.    I then trialed with the size 6 femur, size C tibia, a 13 mm insert and the 32 patella.  I had excellent flexion/extension gap balance, excellent patella tracking.  Flexion was full and beyond 120 degrees; extension was zero.  These components were chosen and the staff opened them to me on the back table while the knee was lavaged copiously and the cement mixed.  The soft tissue was infiltrated with 60cc of exparel  1.3% through a 21 gauge needle.  I cemented in the components and removed all excess cement.  The polyethylene tibial component was snapped into place and the knee placed in extension while cement was hardening.  The capsule was infilltrated with a 60cc exparel /marcaine /saline mixture.   Once the cement was hard, the tourniquet was let down.  Hemostasis was obtained.  The arthrotomy was closed using a #1 stratofix running suture.  The deep soft tissues were closed with #0 vicryls and the subcuticular layer closed with #2-0 vicryl.  The skin was reapproximated and closed with 3.0 Monocryl.  The wound was covered with steristrips, aquacel dressing, and a TED stocking.   The patient was then awakened, extubated, and taken to the recovery room in stable condition.  BLOOD LOSS:  300cc COMPLICATIONS:  None.  PLAN OF CARE: Discharge to home after PACU  PATIENT DISPOSITION:  PACU - hemodynamically stable.   Delay start of Pharmacological VTE agent (>24hrs) due to surgical blood loss or risk of bleeding:  not applicable  Please fax a copy of this op note to my office at [phone] (please only include page 1 and 2 of the Case Information  op note)

## 2024-05-13 NOTE — Anesthesia Procedure Notes (Signed)
 Procedure Name: Intubation Date/Time: 05/13/2024 10:01 AM  Performed by: Augusta Daved SAILOR, CRNAPre-anesthesia Checklist: Patient identified, Emergency Drugs available, Suction available and Patient being monitored Patient Re-evaluated:Patient Re-evaluated prior to induction Oxygen Delivery Method: Circle System Utilized Preoxygenation: Pre-oxygenation with 100% oxygen Induction Type: IV induction Ventilation: Mask ventilation without difficulty Laryngoscope Size: Miller and 2 Grade View: Grade II Tube type: Oral Tube size: 7.0 mm Number of attempts: 1 Airway Equipment and Method: Stylet and Oral airway Placement Confirmation: ETT inserted through vocal cords under direct vision, positive ETCO2 and breath sounds checked- equal and bilateral Secured at: 23 (at the lip) cm Tube secured with: Tape Dental Injury: Teeth and Oropharynx as per pre-operative assessment

## 2024-05-13 NOTE — Discharge Instructions (Signed)

## 2024-05-13 NOTE — H&P (Signed)
 [name] [name] MRN:  [id number] DOB/SEX:  [birth year]/female  CHIEF COMPLAINT:  Painful right Knee  HISTORY: Patient is a 58 y.o. female presented with a history of pain in the right knee. Onset of symptoms was gradual starting 2 years ago with gradually worsening course since that time. Prior procedures on the knee include ACL repair. Patient has been treated conservatively with over-the-counter NSAIDs and activity modification. Patient currently rates pain in the knee at 10 out of 10 with activity. There is pain at night.  PAST MEDICAL HISTORY: Patient Active Problem List   Diagnosis Date Noted   Arthropathy of lumbar facet joint 09/05/2022   Essential hypertension 02/25/2021   Migraine with aura 02/25/2021   Mild intermittent asthma 02/25/2021   Mixed hyperlipidemia 02/25/2021   Osteoarthritis of knee 02/25/2021   Other intervertebral disc degeneration, lumbar region 02/25/2021   Vitamin D deficiency 02/25/2021   Esophageal dysphagia 11/05/2020   Odontogenic infection of jaw 06/10/2020   OSA (obstructive sleep apnea) 04/01/2020   Moderate persistent asthma with acute exacerbation 12/30/2019   Chronic coughing 12/30/2019   Seasonal allergies 12/30/2019   Loud snoring 12/16/2019   Prediabetes 01/04/2019   Obesity (BMI 30-39.9) 01/04/2019   Ganglion cyst 09/28/2018   Carpal tunnel syndrome of right wrist 09/28/2018   Chronic midline low back pain without sciatica 11/23/2017   Elevated blood pressure reading with diagnosis of hypertension 11/23/2017   Chronic pain of right knee 11/23/2017   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   Marijuana abuse 11/23/2017   Tobacco dependence 11/23/2017   Alcohol abuse 07/30/2016   S/P ACL reconstruction 01/31/2014   DEPRESSION 11/17/2008   ALLERGIC RHINITIS 11/17/2008   GERD 11/17/2008   Past Medical History:  Diagnosis Date   Alcohol use disorder, moderate, dependence (HCC)    06-09-2020  per pt not taking naltrexone  as prescribed  since august 2021, unable to afford it   Allergic rhinitis, seasonal    Arthritis    Asthma    Bipolar 1 disorder (HCC)    Chronic constipation    Chronic low back pain    Chronic pain of right knee    COPD (chronic obstructive pulmonary disease) (HCC) 2022   DDD (degenerative disc disease)    Depression    GERD (gastroesophageal reflux disease)    History of 2019 novel coronavirus disease (COVID-19) 05/2019   06-09-2020  per pt residual chronic cough and intermittant sob   History of gastric ulcer    Hyperlipidemia    Hypertension    MDD (major depressive disorder)    Migraine    Mild obstructive sleep apnea    sleep study in epic 01-22-2020, recommended pt to have cpap titrate ,  per pt due to pain have not gone back    Moderate persistent asthma    followed by pcp   Osteoarthritis    Post-COVID chronic cough    Pre-diabetes    Sciatica    Sleep apnea    SOB (shortness of breath)    06-09-2020  per pt intermittant due to residual from covid    Wears glasses    Wears glasses    Past Surgical History:  Procedure Laterality Date   BUNIONECTOMY Left 2011   HEEL SPUR SURGERY Right 2012   KNEE ARTHROSCOPY WITH ANTERIOR CRUCIATE LIGAMENT (ACL) REPAIR WITH HAMSTRING GRAFT Right 01/31/2014   Procedure: RIGHT KNEE ARTHROSCOPY WITH ALLOGRAFT (ACL) ANTERIOR CRUCIATE LIGAMENT RECONSTRUCTON PARTIAL MENISCECTOMY VERSES REPAIR ;  Surgeon: Lamar Collet, MD;  Location: [facility];  Service: Orthopedics;  Laterality: Right;   PLANTAR FASCIA RELEASE Right 06/15/2020   Procedure: ENDOSCOPIC PLANTAR FASCIOTOMY WITH EXCISION OF PAINFUL SCAR;  Surgeon: Burt Fus, DPM;  Location: [facility];  Service: Podiatry;  Laterality: Right;  LOCAL   TOTAL ABDOMINAL HYSTERECTOMY W/ BILATERAL SALPINGOOPHORECTOMY  04-20-2006     MEDICATIONS:   Facility-Administered Medications Prior to Admission  Medication Dose Route Frequency Provider Last Rate Last Admin   0.9 %   sodium chloride  infusion  500 mL Intravenous Once Charlanne Groom, MD       Medications Prior to Admission  Medication Sig Dispense Refill Last Dose/Taking   acetaminophen  (TYLENOL ) 500 MG tablet Take 1 tablet (500 mg total) by mouth every 6 (six) hours as needed for up to 30 doses. 30 tablet 0 05/13/2024 at  5:30 AM   albuterol  (PROVENTIL ) (2.5 MG/3ML) 0.083% nebulizer solution Take 3 mLs (2.5 mg total) by nebulization every 6 (six) hours as needed for wheezing or shortness of breath. 150 mL 1 Past Week   albuterol  (VENTOLIN  HFA) 108 (90 Base) MCG/ACT inhaler Inhale 2 puffs into the lungs every 6 (six) hours as needed for wheezing or shortness of breath. 18 g 2 Unknown   amLODipine  (NORVASC ) 10 MG tablet Take 1 tablet (10 mg total) by mouth daily. 90 tablet 3 05/10/2024   Aspirin -Acetaminophen -Caffeine (BC FAST PAIN RELIEF MAX STR) 500-500-65 MG PACK Take 1 packet by mouth daily as needed (pain).   05/09/2024   atorvastatin  (LIPITOR) 20 MG tablet Take 20 mg by mouth daily.   05/09/2024   cetirizine  (ZYRTEC ) 10 MG tablet Take 10 mg by mouth daily.   05/09/2024   etodolac (LODINE) 400 MG tablet Take 400 mg by mouth 2 (two) times daily.   05/09/2024   fluticasone  (FLONASE ) 50 MCG/ACT nasal spray Place 2 sprays into both nostrils daily. (Patient taking differently: Place 2 sprays into both nostrils daily as needed for allergies.) 16 g 2 05/09/2024   gabapentin  (NEURONTIN ) 800 MG tablet Take 1 tablet (800 mg total) by mouth 3 (three) times daily. 90 tablet 4 05/09/2024   HYDROcodone -acetaminophen  (NORCO) 7.5-325 MG tablet Take 1 tablet by mouth 4 (four) times daily as needed for pain.   05/10/2024   linaclotide (LINZESS) 290 MCG CAPS capsule Take 290 mcg by mouth daily as needed (constipation).   05/09/2024   lisinopril -hydrochlorothiazide  (ZESTORETIC ) 20-25 MG tablet Take 1 tablet by mouth daily. 90 tablet 1 05/09/2024   Prenatal Vit-Fe Fumarate-FA (PRENATAL MULTIVITAMIN) TABS tablet Take 1 tablet by mouth 3  (three) times a week.   05/09/2024   TRELEGY ELLIPTA 200-62.5-25 MCG/ACT AEPB Inhale 1 puff into the lungs daily.   05/13/2024 at  5:30 AM   Vitamin D, Ergocalciferol, (DRISDOL) 1.25 MG (50000 UNIT) CAPS capsule Take 50,000 Units by mouth once a week.   05/09/2024   zolpidem  (AMBIEN ) 5 MG tablet TAKE ONE TABLET BY MOUTH DAILY AT BEDTIME. AS NEEDED FOR SLEEP 15 tablet 0 05/09/2024   Spacer/Aero-Holding Chambers (AEROCHAMBER MV) inhaler Use as instructed 1 each 0     ALLERGIES:   Allergies  Allergen Reactions   Diphenhydramine  Hcl Itching   Metoprolol  Palpitations    REVIEW OF SYSTEMS:  Pertinent items are noted in HPI.   FAMILY HISTORY:   Family History  Problem Relation Age of Onset   Hypertension Father    Breast cancer Maternal Aunt    Pancreatic cancer Sister    Colon cancer Neg Hx    Esophageal cancer  Neg Hx    Rectal cancer Neg Hx    Stomach cancer Neg Hx     SOCIAL HISTORY:   Social History   Tobacco Use   Smoking status: Every Day    Current packs/day: 0.25    Average packs/day: 0.3 packs/day for 32.0 years (8.0 ttl pk-yrs)    Types: Cigarettes   Smokeless tobacco: Never   Tobacco comments:    per pt down to 6 cig per day  Substance Use Topics   Alcohol use: Yes    Comment: may have a drink every few months     EXAMINATION:  Vital signs in last 24 hours: Temp:  [98.1 F (36.7 C)] 98.1 F (36.7 C) (10/06 0734) Pulse Rate:  [70] 70 (10/06 0734) Resp:  [16] 16 (10/06 0734) BP: (184)/(112) 184/112 (10/06 0734) SpO2:  [99 %] 99 % (10/06 0734)  Extremities: extremities normal, atraumatic, no cyanosis or edema  Musculoskeletal:  ROM 0-100 -, Ligaments stabel med and lat,  Imaging Review Plain radiographs demonstrate severe degenerative joint disease of the right knee. The overall alignment is . Slightly valgus. Retained hardware from prev acl repair. The bone quality appears to be good for age and reported activity level.  Assessment/Plan: Primary  osteoarthritis, right knee   The patient history, physical examination and imaging studies are consistent with advanced degenerative joint disease of the right knee. The patient has failed conservative treatment.  The clearance notes were reviewed.  After discussion with the patient it was felt that Total Knee Replacement was indicated. The procedure,  risks, and benefits of total knee arthroplasty were presented and reviewed. The risks including but not limited to aseptic loosening, infection, blood clots, vascular injury, stiffness, patella tracking problems complications among others were discussed. The patient acknowledged the explanation, agreed to proceed with the plan.  Henry Slater Lamp 05/13/2024, 9:18 AM

## 2024-05-13 NOTE — Anesthesia Preprocedure Evaluation (Addendum)
 Anesthesia Evaluation  Patient identified by MRN, date of birth, ID band Patient awake    Reviewed: Allergy & Precautions, NPO status , Patient's Chart, lab work & pertinent test results  Airway Mallampati: I  TM Distance: >3 FB Neck ROM: Full    Dental  (+) Teeth Intact, Dental Advisory Given, Chipped,    Pulmonary asthma , sleep apnea , COPD,  COPD inhaler, Current Smoker (noncompliant with CPAP)Patient did not abstain from smoking.   Pulmonary exam normal breath sounds clear to auscultation       Cardiovascular hypertension, Pt. on medications Normal cardiovascular exam Rhythm:Regular Rate:Normal     Neuro/Psych  Headaches PSYCHIATRIC DISORDERS  Depression Bipolar Disorder      GI/Hepatic ,GERD  ,,(+)     substance abuse (norco)    Endo/Other  negative endocrine ROS    Renal/GU negative Renal ROS  negative genitourinary   Musculoskeletal  (+) Arthritis ,  narcotic dependent  Abdominal   Peds  Hematology negative hematology ROS (+)   Anesthesia Other Findings   Reproductive/Obstetrics                              Anesthesia Physical Anesthesia Plan  ASA: 3  Anesthesia Plan: General and Regional   Post-op Pain Management: Regional block*, Ofirmev  IV (intra-op)*, Ketamine IV* and Dilaudid  IV   Induction: Intravenous  PONV Risk Score and Plan: 2 and Midazolam , Dexamethasone  and Ondansetron   Airway Management Planned: Oral ETT  Additional Equipment:   Intra-op Plan:   Post-operative Plan: Extubation in OR  Informed Consent: I have reviewed the patients History and Physical, chart, labs and discussed the procedure including the risks, benefits and alternatives for the proposed anesthesia with the patient or authorized representative who has indicated his/her understanding and acceptance.     Dental advisory given  Plan Discussed with: CRNA  Anesthesia Plan Comments: (Pt  declines spinal anesthesia. )        Anesthesia Quick Evaluation

## 2024-05-13 NOTE — Transfer of Care (Signed)
 Immediate Anesthesia Transfer of Care Note  Patient: [name] [name]  Procedure(s) Performed: RIGHT TOTAL KNEE ARTHROPLASTY (Right: Knee)  Patient Location: PACU  Anesthesia Type:General  Level of Consciousness: awake, alert , and oriented  Airway & Oxygen Therapy: Patient Spontanous Breathing and Patient connected to face mask oxygen  Post-op Assessment: Report given to RN and Post -op Vital signs reviewed and stable  Post vital signs: Reviewed and stable  Last Vitals:  Vitals Value Taken Time  BP 183/123 05/13/24 11:49  Temp 36.7 C 05/13/24 11:49  Pulse 75 05/13/24 11:53  Resp 12 05/13/24 11:53  SpO2 100 % 05/13/24 11:53  Vitals shown include unfiled device data.  Last Pain:  Vitals:   05/13/24 1149  TempSrc:   PainSc: 6          Complications: No notable events documented.

## 2024-05-13 NOTE — Evaluation (Signed)
 Physical Therapy Evaluation Patient Details Name: [name] [name] MRN: [id number] DOB: 1966-07-31 Today's Date: 05/13/2024  History of Present Illness  58 yo female presents to therapy s/p R TKA on 05/13/2024 due to failure of conservative measures. Pt PMH includes but is not limited to: HTN, asthma, HLD, OA, OSA, DDD, carpal tunnel syndrome R wrist, LBP, depression, substance abuse, COPD, depression, and R foot and knee surgery.  Clinical Impression    [name] [name] is a 58 y.o. female POD 0 s/p R TKA. Patient reports IND with mobility at baseline. Patient is now limited by functional impairments (see PT problem list below) and requires CGA for transfers and gait with RW. Patient was able to ambulate 55 feet x 2 with RW and CGA and cues for safe walker management. Patient educated on safe sequencing for stair mobility, fall risk prevention, use of CP/ice, pain management and goal, use of CPM and car transfers pt and significant other  verbalized understanding of safe guarding position for people assisting with mobility. Patient instructed in exercises to facilitate ROM and circulation reviewed and HO provided. Patient will benefit from continued skilled PT interventions to address impairments and progress towards PLOF. Patient has met mobility goals at adequate level for discharge home with family support and OPPT services scheduled for 10/16; will continue to follow if pt continues acute stay to progress towards Mod I goals.       If plan is discharge home, recommend the following: A little help with walking and/or transfers;A little help with bathing/dressing/bathroom;Assistance with cooking/housework;Help with stairs or ramp for entrance;Assist for transportation   Can travel by private vehicle        Equipment Recommendations None recommended by PT  Recommendations for Other Services       Functional Status Assessment Patient has had a recent decline in their functional status  and demonstrates the ability to make significant improvements in function in a reasonable and predictable amount of time.     Precautions / Restrictions Precautions Precautions: Fall;Knee Restrictions Weight Bearing Restrictions Per Provider Order: No      Mobility  Bed Mobility Overal bed mobility: Needs Assistance Bed Mobility: Supine to Sit     Supine to sit: Supervision     General bed mobility comments: min cues    Transfers Overall transfer level: Needs assistance Equipment used: Rolling walker (2 wheels) Transfers: Sit to/from Stand Sit to Stand: Contact guard assist           General transfer comment: min cues for safety and proper UE and AD placement with bed, recliner and commode transfers    Ambulation/Gait Ambulation/Gait assistance: Contact guard assist Gait Distance (Feet): 55 Feet Assistive device: Rolling walker (2 wheels) Gait Pattern/deviations: Step-to pattern, Decreased stance time - right, Antalgic, Trunk flexed Gait velocity: decreased     General Gait Details: slight trunk flexion with B UE support at RW to offload R LE in stance phase step to pattern with minimal R knee flexion in stance phase, cues for posture, safety and RW management  Stairs Stairs: Yes Stairs assistance: Contact guard assist Stair Management: Two rails Number of Stairs: 5 General stair comments: min cues for safety and sequencing, while performing step navigation pt reported having B handrails to acces second story, PT provided pt and family ed on guarding pt on steps  Wheelchair Mobility     Tilt Bed    Modified Rankin (Stroke Patients Only)       Balance Overall balance assessment:  Needs assistance, History of Falls (2) Sitting-balance support: Feet supported Sitting balance-Leahy Scale: Good     Standing balance support: Bilateral upper extremity supported, During functional activity, Reliant on assistive device for balance Standing balance-Leahy  Scale: Fair Standing balance comment: static standing no UE support                             Pertinent Vitals/Pain Pain Assessment Pain Assessment: 0-10 Pain Score: 4  Pain Location: R knee and LE Pain Descriptors / Indicators: Aching, Discomfort, Constant, Operative site guarding, Grimacing Pain Intervention(s): Limited activity within patient's tolerance, Monitored during session, Ice applied, Repositioned, Premedicated before session    Home Living Family/patient expects to be discharged to:: Private residence Living Arrangements: Spouse/significant other Available Help at Discharge: Family Type of Home: Apartment Home Access: Level entry     Alternate Level Stairs-Number of Steps: 12 Home Layout: Two level;Bed/bath upstairs Home Equipment: Agricultural consultant (2 wheels);Cane - single point Additional Comments: CPM delivered    Prior Function Prior Level of Function : Independent/Modified Independent             Mobility Comments: intermittent use of SPC for mobility tasks       Extremity/Trunk Assessment        Lower Extremity Assessment Lower Extremity Assessment: RLE deficits/detail RLE Deficits / Details: ankle DF/PF 5/5; SLR > 10 degree lag RLE Sensation: WNL    Cervical / Trunk Assessment Cervical / Trunk Assessment: Normal  Communication   Communication Communication: No apparent difficulties    Cognition Arousal: Alert Behavior During Therapy: WFL for tasks assessed/performed   PT - Cognitive impairments: No apparent impairments                         Following commands: Intact       Cueing       General Comments      Exercises Total Joint Exercises Ankle Circles/Pumps: AROM, Both, 10 reps Quad Sets: AROM, Right, 5 reps Short Arc Quad: AROM, Right, 5 reps Heel Slides: AROM, Right, 5 reps Hip ABduction/ADduction: AROM, Right, 5 reps Straight Leg Raises: AROM, Right, 5 reps Knee Flexion: AROM, Right, 5 reps    Assessment/Plan    PT Assessment Patient needs continued PT services  PT Problem List Decreased strength;Decreased range of motion;Decreased activity tolerance;Decreased balance;Decreased mobility;Pain       PT Treatment Interventions DME instruction;Gait training;Stair training;Functional mobility training;Therapeutic activities;Therapeutic exercise;Balance training;Neuromuscular re-education;Patient/family education;Modalities    PT Goals (Current goals can be found in the Care Plan section)  Acute Rehab PT Goals Patient Stated Goal: to be able to play more with the grandkids PT Goal Formulation: With patient Time For Goal Achievement: 05/27/24 Potential to Achieve Goals: Good    Frequency 7X/week     Co-evaluation               AM-PAC PT 6 Clicks Mobility  Outcome Measure Help needed turning from your back to your side while in a flat bed without using bedrails?: A Little Help needed moving from lying on your back to sitting on the side of a flat bed without using bedrails?: A Little Help needed moving to and from a bed to a chair (including a wheelchair)?: A Little Help needed standing up from a chair using your arms (e.g., wheelchair or bedside chair)?: A Little Help needed to walk in hospital room?: A Little Help needed climbing 3-5 steps  with a railing? : A Little 6 Click Score: 18    End of Session Equipment Utilized During Treatment: Gait belt Activity Tolerance: Patient tolerated treatment well;No increased pain Patient left: in chair;with call bell/phone within reach;with family/visitor present Nurse Communication: Mobility status;Other (comment) (pt readiness from PT standpoint for same day d/c) PT Visit Diagnosis: Unsteadiness on feet (R26.81);Other abnormalities of gait and mobility (R26.89);Muscle weakness (generalized) (M62.81);History of falling (Z91.81);Difficulty in walking, not elsewhere classified (R26.2);Pain Pain - Right/Left: Right Pain -  part of body: Leg;Knee    Time: 8347-8266 PT Time Calculation (min) (ACUTE ONLY): 41 min   Charges:   PT Evaluation $PT Eval Low Complexity: 1 Low PT Treatments $Gait Training: 8-22 mins $Therapeutic Exercise: 8-22 mins PT General Charges $$ ACUTE PT VISIT: 1 Visit         [place], PT Acute Rehab   [place] [name] [name] 05/13/2024, 6:56 PM

## 2024-05-14 ENCOUNTER — Encounter (HOSPITAL_COMMUNITY): Payer: Self-pay | Admitting: Orthopedic Surgery

## 2024-05-23 ENCOUNTER — Ambulatory Visit

## 2024-05-24 ENCOUNTER — Ambulatory Visit

## 2024-05-27 ENCOUNTER — Other Ambulatory Visit: Payer: Self-pay

## 2024-05-27 ENCOUNTER — Ambulatory Visit: Attending: Orthopedic Surgery

## 2024-05-27 DIAGNOSIS — M6281 Muscle weakness (generalized): Secondary | ICD-10-CM | POA: Diagnosis present

## 2024-05-27 DIAGNOSIS — G8929 Other chronic pain: Secondary | ICD-10-CM | POA: Diagnosis present

## 2024-05-27 DIAGNOSIS — R262 Difficulty in walking, not elsewhere classified: Secondary | ICD-10-CM | POA: Diagnosis present

## 2024-05-27 DIAGNOSIS — M25561 Pain in right knee: Secondary | ICD-10-CM | POA: Insufficient documentation

## 2024-05-27 DIAGNOSIS — R2689 Other abnormalities of gait and mobility: Secondary | ICD-10-CM | POA: Insufficient documentation

## 2024-05-27 DIAGNOSIS — R600 Localized edema: Secondary | ICD-10-CM | POA: Diagnosis present

## 2024-05-27 NOTE — Therapy (Signed)
 OUTPATIENT PHYSICAL THERAPY LOWER EXTREMITY EVALUATION   Patient Name: [name] [name] MRN: [id number] DOB:03/28/1966, 58 y.o., female Today's Date: 05/27/2024  END OF SESSION:  PT End of Session - 05/27/24 1013     Visit Number 1    Date for Recertification  08/19/24    PT Start Time 1015    PT Stop Time 1050    PT Time Calculation (min) 35 min    Activity Tolerance Patient tolerated treatment well;Patient limited by pain    Behavior During Therapy [facility] for tasks assessed/performed          Past Medical History:  Diagnosis Date   Alcohol use disorder, moderate, dependence (HCC)    06-09-2020  per pt not taking naltrexone  as prescribed since august 2021, unable to afford it   Allergic rhinitis, seasonal    Arthritis    Asthma    Bipolar 1 disorder (HCC)    Chronic constipation    Chronic low back pain    Chronic pain of right knee    COPD (chronic obstructive pulmonary disease) (HCC) 2022   DDD (degenerative disc disease)    Depression    GERD (gastroesophageal reflux disease)    History of 2019 novel coronavirus disease (COVID-19) 05/2019   06-09-2020  per pt residual chronic cough and intermittant sob   History of gastric ulcer    Hyperlipidemia    Hypertension    MDD (major depressive disorder)    Migraine    Mild obstructive sleep apnea    sleep study in epic 01-22-2020, recommended pt to have cpap titrate ,  per pt due to pain have not gone back    Moderate persistent asthma    followed by pcp   Osteoarthritis    Post-COVID chronic cough    Pre-diabetes    Sciatica    Sleep apnea    SOB (shortness of breath)    06-09-2020  per pt intermittant due to residual from covid    Wears glasses    Wears glasses    Past Surgical History:  Procedure Laterality Date   BUNIONECTOMY Left 2011   HEEL SPUR SURGERY Right 2012   KNEE ARTHROSCOPY WITH ANTERIOR CRUCIATE LIGAMENT (ACL) REPAIR WITH HAMSTRING GRAFT Right 01/31/2014   Procedure: RIGHT KNEE ARTHROSCOPY  WITH ALLOGRAFT (ACL) ANTERIOR CRUCIATE LIGAMENT RECONSTRUCTON PARTIAL MENISCECTOMY VERSES REPAIR ;  Surgeon: Lamar Collet, MD;  Location: [facility];  Service: Orthopedics;  Laterality: Right;   PLANTAR FASCIA RELEASE Right 06/15/2020   Procedure: ENDOSCOPIC PLANTAR FASCIOTOMY WITH EXCISION OF PAINFUL SCAR;  Surgeon: Burt Fus, DPM;  Location:  [facility];  Service: Podiatry;  Laterality: Right;  LOCAL   TOTAL ABDOMINAL HYSTERECTOMY W/ BILATERAL SALPINGOOPHORECTOMY  04-20-2006   TOTAL KNEE ARTHROPLASTY Right 05/13/2024   Procedure: RIGHT TOTAL KNEE ARTHROPLASTY;  Surgeon: Rubie Kemps, MD;  Location: [facility];  Service: Orthopedics;  Laterality: Right;   Patient Active Problem List   Diagnosis Date Noted   Arthropathy of lumbar facet joint 09/05/2022   Essential hypertension 02/25/2021   Migraine with aura 02/25/2021   Mild intermittent asthma 02/25/2021   Mixed hyperlipidemia 02/25/2021   Osteoarthritis of knee 02/25/2021   Other intervertebral disc degeneration, lumbar region 02/25/2021   Vitamin D deficiency 02/25/2021   Esophageal dysphagia 11/05/2020   Odontogenic infection of jaw 06/10/2020   OSA (obstructive sleep apnea) 04/01/2020   Moderate persistent asthma with acute exacerbation 12/30/2019   Chronic coughing 12/30/2019   Seasonal allergies 12/30/2019   Loud snoring  12/16/2019   Prediabetes 01/04/2019   Obesity (BMI 30-39.9) 01/04/2019   Ganglion cyst 09/28/2018   Carpal tunnel syndrome of right wrist 09/28/2018   Chronic midline low back pain without sciatica 11/23/2017   Elevated blood pressure reading with diagnosis of hypertension 11/23/2017   Chronic pain of right knee 11/23/2017   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   Marijuana abuse 11/23/2017   Tobacco dependence 11/23/2017   Alcohol abuse 07/30/2016   S/P ACL reconstruction 01/31/2014   DEPRESSION 11/17/2008   ALLERGIC RHINITIS 11/17/2008   GERD 11/17/2008    PCP:  Cleotilde Bernardino Hutchinson PA  REFERRING PROVIDER: Marcey Raman, MD  REFERRING DIAG: s/p R TKA due to OA  THERAPY DIAG:  Chronic pain of right knee  Acute pain of right knee  Difficulty in walking, not elsewhere classified  Rationale for Evaluation and Treatment: Rehabilitation  ONSET DATE: 05/13/24  SUBJECTIVE:   SUBJECTIVE STATEMENT: In a lot of pain R knee , taking gabapentin , knee buckling a lot,R ankle hurts too   PERTINENT HISTORY: Underwent TKA 2 weeks ago, referred to outpt PT PAIN:  Are you having pain? Yes: NPRS scale: 8 -10 Pain location: R knee R medial ankle  Pain description: deep pain Aggravating factors: walking worse Relieving factors: meds  PRECAUTIONS: Fall  RED FLAGS: None   WEIGHT BEARING RESTRICTIONS: No  FALLS:  Has patient fallen in last 6 months? No  LIVING ENVIRONMENT: Lives with: lives with an adult companion and lives with their partner Lives in: House/apartment Stairs: Yes: Internal: 11 steps; on right going up Has following equipment at home: Single point cane and Walker - 2 wheeled  OCCUPATION: disability  PLOF: Independent  PATIENT GOALS: no pain  NEXT MD VISIT: 4 weeks, mid Novemeber  OBJECTIVE:  Note: Objective measures were completed at Evaluation unless otherwise noted.  DIAGNOSTIC FINDINGS: na  PATIENT SURVEYS:  KOOS, JR. Score: 25 / 28, Interval Score: 20.941 / 100  COGNITION: Overall cognitive status: Within functional limits for tasks assessed     SENSATION: WFL  EDEMA:  Mod edema R knee, bulbous appearance, steri strips at intervals across R knee incision, bruising med and lat patella, no abnormal warmth noted    POSTURE: leans forward in standing, maintains R knee extended with sitting and standing, any transitional movements  PALPATION: Good R patellar mobility, very   LOWER EXTREMITY ROM:  Passive ROM Right eval Left eval  Hip flexion    Hip extension    Hip abduction    Hip adduction    Hip  internal rotation    Hip external rotation    Knee flexion 55   Knee extension -10   Ankle dorsiflexion    Ankle plantarflexion    Ankle inversion    Ankle eversion     (Blank rows = not tested)  LOWER EXTREMITY MMT: R quad set trace, needs total assist to initiate and perform SLR R R hamstrings 2/5, ankle unable to test due to pain L LE wfl   FUNCTIONAL TESTS:  Timed up and go (TUG): greater than 40 sec, with front wheeled walker  GAIT: Distance walked: in clinic step to pattern, externally rotates R lower leg, ankle antalgic on R tends to flex entire trunk Assistive device utilized: Walker - 2 wheeled Level of assistance: SBA  TREATMENT DATE: 05/27/24:  Evaluation, education throughout regarding goals, POC, expected outcomes.  Educated and demonstrated with pt elevation over her heart on R to reduce edema.  Instructed in the exercises as listed below, education regarding scarring and pushing R knee flexion motion as much as possible   PATIENT EDUCATION:  Education details: POC, goals  Person educated: Patient Education method: Explanation, Demonstration, Tactile cues, Verbal cues, and Handouts Education comprehension: verbalized understanding and returned demonstration  HOME EXERCISE PROGRAM: Access Code: [email or web address] URL: [email or web address] Date: 05/27/2024 Prepared by: Keria Widrig  Exercises - Supine Quad Set on Towel Roll  - 1 x daily - 7 x weekly - 3 sets - 10 reps - Supine Heel Slide with Strap  - 1 x daily - 7 x weekly - 3 sets - 10 reps - Hooklying Straight Leg Raise  - 1 x daily - 7 x weekly - 3 sets - 10 reps - Lunge on Step  - 1 x daily - 7 x weekly - 3 sets - 10 reps  ASSESSMENT:  CLINICAL IMPRESSION: Patient is a 58 y.o. female who was evaluated today by physical therapy  for recovery following R TKA.  She is 2 weeks  post op.  She is experiencing elevated pain, at 8 to 10/10, in R knee and R medial ankle.  She is very limited with R knee flexion ROM, as well as tolerance of this motion. Has palpable R quad set but very weak. Today's visit was spent updating her HEP as well as advising her regarding positioning above heart for edema and therefore pain reduction. She will benefit from physical therapy to assist her with regaining maximal function as she recovers from this surgery.    OBJECTIVE IMPAIRMENTS: decreased activity tolerance, decreased balance, decreased endurance, decreased knowledge of condition, decreased knowledge of use of DME, decreased mobility, difficulty walking, decreased ROM, decreased strength, increased edema, increased fascial restrictions, impaired perceived functional ability, impaired flexibility, and pain.   ACTIVITY LIMITATIONS: carrying, lifting, bending, sitting, standing, squatting, sleeping, stairs, transfers, bed mobility, bathing, toileting, dressing, locomotion level, and caring for others  PARTICIPATION LIMITATIONS: meal prep, cleaning, laundry, shopping, community activity, and yard work  PERSONAL FACTORS: Education, Fitness, Time since onset of injury/illness/exacerbation, and 1-2 comorbidities: COPD, depression are also affecting patient's functional outcome.   REHAB POTENTIAL: Fair comorbidities  CLINICAL DECISION MAKING: Unstable/unpredictable  EVALUATION COMPLEXITY: Moderate   GOALS: Goals reviewed with patient? Yes  SHORT TERM GOALS: Target date: 2 weeks 06/10/24 I HEP  Baseline:initiated Goal status: INITIAL   LONG TERM GOALS: Target date: 12 weeks, 08/19/24  KOOS, JR. Score: 25 / 28, Interval Score: 20.941 / 100 improve to 5/28 Baseline:  Goal status: INITIAL  2.  TUG less than 14 sec with LAD Baseline: greater than 40 sec with r walker Goal status: INITIAL  3.  ROM R knee 110 to -5 Baseline: 55 to -10 Goal status: INITIAL  4.  LE strength R 5/5,  long arc quad and SLR without lag to prevent buckling, falls  Baseline:  Goal status: INITIAL    PLAN:  PT FREQUENCY: 2x/week  PT DURATION: 12 weeks  PLANNED INTERVENTIONS: 97110-Therapeutic exercises, 97530- Therapeutic activity, 97112- Neuromuscular re-education, 97535- Self Care, [id number]- Manual therapy, Patient/Family education, Cryotherapy, and Moist heat  PLAN FOR NEXT SESSION: manual techniques, utilize Nustep or recumbent cycle, progress/ encourage ROM and motor recruitment R LE   Lindsi Bayliss L Bilal Manzer, PT, DPT, OCS 05/27/2024, 12:41 PM

## 2024-05-30 ENCOUNTER — Ambulatory Visit: Admitting: Physical Therapy

## 2024-05-30 NOTE — Therapy (Signed)
 OUTPATIENT PHYSICAL THERAPY LOWER EXTREMITY TREATMENT   Patient Name: [name] [name] MRN: [id number] DOB:1965-09-25, 58 y.o., female Today's Date: 06/03/2024  END OF SESSION:  PT End of Session - 06/03/24 1007     Visit Number 2    Date for Recertification  08/19/24    PT Start Time 1008    PT Stop Time 1055    PT Time Calculation (min) 47 min    Activity Tolerance Patient tolerated treatment well;Patient limited by pain    Behavior During Therapy [facility] for tasks assessed/performed           Past Medical History:  Diagnosis Date   Alcohol use disorder, moderate, dependence (HCC)    06-09-2020  per pt not taking naltrexone  as prescribed since august 2021, unable to afford it   Allergic rhinitis, seasonal    Arthritis    Asthma    Bipolar 1 disorder (HCC)    Chronic constipation    Chronic low back pain    Chronic pain of right knee    COPD (chronic obstructive pulmonary disease) (HCC) 2022   DDD (degenerative disc disease)    Depression    GERD (gastroesophageal reflux disease)    History of 2019 novel coronavirus disease (COVID-19) 05/2019   06-09-2020  per pt residual chronic cough and intermittant sob   History of gastric ulcer    Hyperlipidemia    Hypertension    MDD (major depressive disorder)    Migraine    Mild obstructive sleep apnea    sleep study in epic 01-22-2020, recommended pt to have cpap titrate ,  per pt due to pain have not gone back    Moderate persistent asthma    followed by pcp   Osteoarthritis    Post-COVID chronic cough    Pre-diabetes    Sciatica    Sleep apnea    SOB (shortness of breath)    06-09-2020  per pt intermittant due to residual from covid    Wears glasses    Wears glasses    Past Surgical History:  Procedure Laterality Date   BUNIONECTOMY Left 2011   HEEL SPUR SURGERY Right 2012   KNEE ARTHROSCOPY WITH ANTERIOR CRUCIATE LIGAMENT (ACL) REPAIR WITH HAMSTRING GRAFT Right 01/31/2014   Procedure: RIGHT KNEE ARTHROSCOPY  WITH ALLOGRAFT (ACL) ANTERIOR CRUCIATE LIGAMENT RECONSTRUCTON PARTIAL MENISCECTOMY VERSES REPAIR ;  Surgeon: Lamar Collet, MD;  Location: [facility];  Service: Orthopedics;  Laterality: Right;   PLANTAR FASCIA RELEASE Right 06/15/2020   Procedure: ENDOSCOPIC PLANTAR FASCIOTOMY WITH EXCISION OF PAINFUL SCAR;  Surgeon: Burt Fus, DPM;  Location:  [facility];  Service: Podiatry;  Laterality: Right;  LOCAL   TOTAL ABDOMINAL HYSTERECTOMY W/ BILATERAL SALPINGOOPHORECTOMY  04-20-2006   TOTAL KNEE ARTHROPLASTY Right 05/13/2024   Procedure: RIGHT TOTAL KNEE ARTHROPLASTY;  Surgeon: Rubie Kemps, MD;  Location: [facility];  Service: Orthopedics;  Laterality: Right;   Patient Active Problem List   Diagnosis Date Noted   Arthropathy of lumbar facet joint 09/05/2022   Essential hypertension 02/25/2021   Migraine with aura 02/25/2021   Mild intermittent asthma 02/25/2021   Mixed hyperlipidemia 02/25/2021   Osteoarthritis of knee 02/25/2021   Other intervertebral disc degeneration, lumbar region 02/25/2021   Vitamin D deficiency 02/25/2021   Esophageal dysphagia 11/05/2020   Odontogenic infection of jaw 06/10/2020   OSA (obstructive sleep apnea) 04/01/2020   Moderate persistent asthma with acute exacerbation 12/30/2019   Chronic coughing 12/30/2019   Seasonal allergies 12/30/2019   Loud  snoring 12/16/2019   Prediabetes 01/04/2019   Obesity (BMI 30-39.9) 01/04/2019   Ganglion cyst 09/28/2018   Carpal tunnel syndrome of right wrist 09/28/2018   Chronic midline low back pain without sciatica 11/23/2017   Elevated blood pressure reading with diagnosis of hypertension 11/23/2017   Chronic pain of right knee 11/23/2017   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   Marijuana abuse 11/23/2017   Tobacco dependence 11/23/2017   Alcohol abuse 07/30/2016   S/P ACL reconstruction 01/31/2014   DEPRESSION 11/17/2008   ALLERGIC RHINITIS 11/17/2008   GERD 11/17/2008    PCP:  Cleotilde Bernardino Hutchinson PA  REFERRING PROVIDER: Marcey Raman, MD  REFERRING DIAG: s/p R TKA due to OA  THERAPY DIAG:  Chronic pain of right knee  Acute pain of right knee  Localized edema  Muscle weakness (generalized)  Difficulty in walking, not elsewhere classified  Other abnormalities of gait and mobility  Rationale for Evaluation and Treatment: Rehabilitation  ONSET DATE: 05/13/24  SUBJECTIVE:   SUBJECTIVE STATEMENT: Still in a lot of pain. It is aggravating. Keeping me up at night. Took Advil  PM. Not in as much pain as I have been this morning.   PERTINENT HISTORY: Underwent TKA 2 weeks ago, referred to outpt PT PAIN:  Are you having pain? Yes: NPRS scale: 8 -10 Pain location: R knee R medial ankle  Pain description: deep pain Aggravating factors: walking worse Relieving factors: meds  PRECAUTIONS: Fall  RED FLAGS: None   WEIGHT BEARING RESTRICTIONS: No  FALLS:  Has patient fallen in last 6 months? No  LIVING ENVIRONMENT: Lives with: lives with an adult companion and lives with their partner Lives in: House/apartment Stairs: Yes: Internal: 11 steps; on right going up Has following equipment at home: Single point cane and Walker - 2 wheeled  OCCUPATION: disability  PLOF: Independent  PATIENT GOALS: no pain  NEXT MD VISIT: 4 weeks, mid Novemeber  OBJECTIVE:  Note: Objective measures were completed at Evaluation unless otherwise noted.  DIAGNOSTIC FINDINGS: na  PATIENT SURVEYS:  KOOS, JR. Score: 25 / 28, Interval Score: 20.941 / 100  COGNITION: Overall cognitive status: Within functional limits for tasks assessed     SENSATION: WFL  EDEMA:  Mod edema R knee, bulbous appearance, steri strips at intervals across R knee incision, bruising med and lat patella, no abnormal warmth noted    POSTURE: leans forward in standing, maintains R knee extended with sitting and standing, any transitional movements  PALPATION: Good R patellar mobility, very    LOWER EXTREMITY ROM:  Passive ROM Right eval Left eval  Hip flexion    Hip extension    Hip abduction    Hip adduction    Hip internal rotation    Hip external rotation    Knee flexion 55   Knee extension -10   Ankle dorsiflexion    Ankle plantarflexion    Ankle inversion    Ankle eversion     (Blank rows = not tested)  LOWER EXTREMITY MMT: R quad set trace, needs total assist to initiate and perform SLR R R hamstrings 2/5, ankle unable to test due to pain L LE wfl   FUNCTIONAL TESTS:  Timed up and go (TUG): greater than 40 sec, with front wheeled walker  GAIT: Distance walked: in clinic step to pattern, externally rotates R lower leg, ankle antalgic on R tends to flex entire trunk Assistive device utilized: Walker - 2 wheeled Level of assistance: SBA  TREATMENT DATE:  06/03/24 NuStep L1 x52mins LE  PROM to R knee, patellar mobs, end range holds to her tolerance  Quad set 5s x5 LAQ x10 Heel slides x10 SAQ 2x10 VASO med pressure, 75F x10 mins    05/27/24:  Evaluation, education throughout regarding goals, POC, expected outcomes.  Educated and demonstrated with pt elevation over her heart on R to reduce edema.  Instructed in the exercises as listed below, education regarding scarring and pushing R knee flexion motion as much as possible   PATIENT EDUCATION:  Education details: POC, goals  Person educated: Patient Education method: Explanation, Demonstration, Tactile cues, Verbal cues, and Handouts Education comprehension: verbalized understanding and returned demonstration  HOME EXERCISE PROGRAM: Access Code: [email or web address] URL: [email or web address] Date: 05/27/2024 Prepared by: Amy Speaks  Exercises - Supine Quad Set on Towel Roll  - 1 x daily - 7 x weekly - 3 sets - 10 reps - Supine Heel Slide with Strap  - 1 x daily -  7 x weekly - 3 sets - 10 reps - Hooklying Straight Leg Raise  - 1 x daily - 7 x weekly - 3 sets - 10 reps - Lunge on Step  - 1 x daily - 7 x weekly - 3 sets - 10 reps  ASSESSMENT:  CLINICAL IMPRESSION: Patient is a 58 y.o. female who was seen today by physical therapy for recovery following R TKA.  She is 3 weeks post op today. She is experiencing elevated pain levels still. Patient is very limited with R knee flexion ROM, as well as tolerance of this motion. She tolerates knee extension ROM better. Pain levels limit her participation in session, but she does give it her best and is willing to do what she has to in order to get better. She reports not doing HEP and icing as much, was educated on how this will be imperative for her recovery moving forward. She reports it feels a little better at end of session. She will benefit from physical therapy to assist her with regaining maximal function as she recovers from this surgery.     OBJECTIVE IMPAIRMENTS: decreased activity tolerance, decreased balance, decreased endurance, decreased knowledge of condition, decreased knowledge of use of DME, decreased mobility, difficulty walking, decreased ROM, decreased strength, increased edema, increased fascial restrictions, impaired perceived functional ability, impaired flexibility, and pain.   ACTIVITY LIMITATIONS: carrying, lifting, bending, sitting, standing, squatting, sleeping, stairs, transfers, bed mobility, bathing, toileting, dressing, locomotion level, and caring for others  PARTICIPATION LIMITATIONS: meal prep, cleaning, laundry, shopping, community activity, and yard work  PERSONAL FACTORS: Education, Fitness, Time since onset of injury/illness/exacerbation, and 1-2 comorbidities: COPD, depression are also affecting patient's functional outcome.   REHAB POTENTIAL: Fair comorbidities  CLINICAL DECISION MAKING: Unstable/unpredictable  EVALUATION COMPLEXITY: Moderate   GOALS: Goals reviewed  with patient? Yes  SHORT TERM GOALS: Target date: 2 weeks 06/10/24 I HEP  Baseline:initiated Goal status: INITIAL   LONG TERM GOALS: Target date: 12 weeks, 08/19/24  KOOS, JR. Score: 25 / 28, Interval Score: 20.941 / 100 improve to 5/28 Baseline:  Goal status: INITIAL  2.  TUG less than 14 sec with LAD Baseline: greater than 40 sec with r walker Goal status: INITIAL  3.  ROM R knee 110 to -5 Baseline: 55 to -10 Goal status: INITIAL  4.  LE strength R 5/5, long arc quad and SLR without lag to prevent buckling, falls  Baseline:  Goal status: INITIAL    PLAN:  PT FREQUENCY: 2x/week  PT DURATION: 12 weeks  PLANNED INTERVENTIONS: 97110-Therapeutic exercises, 97530- Therapeutic activity, [id number]- Neuromuscular re-education, 97535- Self Care, [id number]- Manual therapy, [id number]- Vasopneumatic device, Patient/Family education, Cryotherapy, and Moist heat  PLAN FOR NEXT SESSION: manual techniques, utilize Nustep or recumbent cycle, progress/ encourage ROM and motor recruitment R LE   Almetta Fam, PT, DPT 06/03/2024, 10:57 AM

## 2024-06-03 ENCOUNTER — Ambulatory Visit

## 2024-06-03 DIAGNOSIS — R262 Difficulty in walking, not elsewhere classified: Secondary | ICD-10-CM

## 2024-06-03 DIAGNOSIS — R2689 Other abnormalities of gait and mobility: Secondary | ICD-10-CM

## 2024-06-03 DIAGNOSIS — M25561 Pain in right knee: Secondary | ICD-10-CM | POA: Diagnosis not present

## 2024-06-03 DIAGNOSIS — R600 Localized edema: Secondary | ICD-10-CM

## 2024-06-03 DIAGNOSIS — M6281 Muscle weakness (generalized): Secondary | ICD-10-CM

## 2024-06-03 DIAGNOSIS — G8929 Other chronic pain: Secondary | ICD-10-CM

## 2024-06-06 ENCOUNTER — Ambulatory Visit: Admitting: Physical Therapy

## 2024-06-10 ENCOUNTER — Ambulatory Visit (HOSPITAL_COMMUNITY)

## 2024-06-11 ENCOUNTER — Ambulatory Visit (HOSPITAL_COMMUNITY)
Admission: RE | Admit: 2024-06-11 | Discharge: 2024-06-11 | Disposition: A | Discharge status: Home / Self Care | Source: Ambulatory Visit | Attending: Vascular Surgery | Admitting: Vascular Surgery

## 2024-06-11 ENCOUNTER — Other Ambulatory Visit (HOSPITAL_COMMUNITY): Payer: Self-pay | Admitting: Physician Assistant

## 2024-06-11 DIAGNOSIS — M79604 Pain in right leg: Secondary | ICD-10-CM | POA: Diagnosis not present

## 2024-06-11 DIAGNOSIS — M7989 Other specified soft tissue disorders: Secondary | ICD-10-CM | POA: Insufficient documentation

## 2024-06-12 ENCOUNTER — Encounter: Payer: Self-pay | Admitting: Physical Therapy

## 2024-06-12 ENCOUNTER — Ambulatory Visit: Attending: Orthopedic Surgery | Admitting: Physical Therapy

## 2024-06-12 DIAGNOSIS — G8929 Other chronic pain: Secondary | ICD-10-CM | POA: Diagnosis present

## 2024-06-12 DIAGNOSIS — R600 Localized edema: Secondary | ICD-10-CM | POA: Diagnosis present

## 2024-06-12 DIAGNOSIS — M6281 Muscle weakness (generalized): Secondary | ICD-10-CM | POA: Insufficient documentation

## 2024-06-12 DIAGNOSIS — M25561 Pain in right knee: Secondary | ICD-10-CM | POA: Insufficient documentation

## 2024-06-12 DIAGNOSIS — R2689 Other abnormalities of gait and mobility: Secondary | ICD-10-CM | POA: Diagnosis present

## 2024-06-12 DIAGNOSIS — R262 Difficulty in walking, not elsewhere classified: Secondary | ICD-10-CM | POA: Insufficient documentation

## 2024-06-12 NOTE — Therapy (Signed)
 OUTPATIENT PHYSICAL THERAPY LOWER EXTREMITY TREATMENT   Patient Name: [name] [name] MRN: [id number] DOB:[birth year], 58 y.o., female Today's Date: 06/12/2024  END OF SESSION:  PT End of Session - 06/12/24 1030     Visit Number 3    Date for Recertification  08/19/24    PT Start Time 1027    PT Stop Time 1100    PT Time Calculation (min) 33 min    Activity Tolerance Patient tolerated treatment well;Patient limited by pain    Behavior During Therapy [facility] for tasks assessed/performed           Past Medical History:  Diagnosis Date   Alcohol use disorder, moderate, dependence (HCC)    06-09-2020  per pt not taking naltrexone  as prescribed since august 2021, unable to afford it   Allergic rhinitis, seasonal    Arthritis    Asthma    Bipolar 1 disorder (HCC)    Chronic constipation    Chronic low back pain    Chronic pain of right knee    COPD (chronic obstructive pulmonary disease) (HCC) 2022   DDD (degenerative disc disease)    Depression    GERD (gastroesophageal reflux disease)    History of 2019 novel coronavirus disease (COVID-19) 05/2019   06-09-2020  per pt residual chronic cough and intermittant sob   History of gastric ulcer    Hyperlipidemia    Hypertension    MDD (major depressive disorder)    Migraine    Mild obstructive sleep apnea    sleep study in epic 01-22-2020, recommended pt to have cpap titrate ,  per pt due to pain have not gone back    Moderate persistent asthma    followed by pcp   Osteoarthritis    Post-COVID chronic cough    Pre-diabetes    Sciatica    Sleep apnea    SOB (shortness of breath)    06-09-2020  per pt intermittant due to residual from covid    Wears glasses    Wears glasses    Past Surgical History:  Procedure Laterality Date   BUNIONECTOMY Left 2011   HEEL SPUR SURGERY Right 2012   KNEE ARTHROSCOPY WITH ANTERIOR CRUCIATE LIGAMENT (ACL) REPAIR WITH HAMSTRING GRAFT Right 01/31/2014   Procedure: RIGHT KNEE ARTHROSCOPY  WITH ALLOGRAFT (ACL) ANTERIOR CRUCIATE LIGAMENT RECONSTRUCTON PARTIAL MENISCECTOMY VERSES REPAIR ;  Surgeon: Lamar Collet, MD;  Location: [facility] ;  Service: Orthopedics;  Laterality: Right;   PLANTAR FASCIA RELEASE Right 06/15/2020   Procedure: ENDOSCOPIC PLANTAR FASCIOTOMY WITH EXCISION OF PAINFUL SCAR;  Surgeon: Burt Fus, DPM;  Location: [facility];  Service: Podiatry;  Laterality: Right;  LOCAL   TOTAL ABDOMINAL HYSTERECTOMY W/ BILATERAL SALPINGOOPHORECTOMY  04-20-2006   TOTAL KNEE ARTHROPLASTY Right 05/13/2024   Procedure: RIGHT TOTAL KNEE ARTHROPLASTY;  Surgeon: Rubie Kemps, MD;  Location: [facility];  Service: Orthopedics;  Laterality: Right;   Patient Active Problem List   Diagnosis Date Noted   Arthropathy of lumbar facet joint 09/05/2022   Essential hypertension 02/25/2021   Migraine with aura 02/25/2021   Mild intermittent asthma 02/25/2021   Mixed hyperlipidemia 02/25/2021   Osteoarthritis of knee 02/25/2021   Other intervertebral disc degeneration, lumbar region 02/25/2021   Vitamin D deficiency 02/25/2021   Esophageal dysphagia 11/05/2020   Odontogenic infection of jaw 06/10/2020   OSA (obstructive sleep apnea) 04/01/2020   Moderate persistent asthma with acute exacerbation 12/30/2019   Chronic coughing 12/30/2019   Seasonal allergies 12/30/2019   Loud  snoring 12/16/2019   Prediabetes 01/04/2019   Obesity (BMI 30-39.9) 01/04/2019   Ganglion cyst 09/28/2018   Carpal tunnel syndrome of right wrist 09/28/2018   Chronic midline low back pain without sciatica 11/23/2017   Elevated blood pressure reading with diagnosis of hypertension 11/23/2017   Chronic pain of right knee 11/23/2017   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   Marijuana abuse 11/23/2017   Tobacco dependence 11/23/2017   Alcohol abuse 07/30/2016   S/P ACL reconstruction 01/31/2014   DEPRESSION 11/17/2008   ALLERGIC RHINITIS 11/17/2008   GERD 11/17/2008    PCP:  Cleotilde Bernardino Hutchinson PA  REFERRING PROVIDER: Marcey Raman, MD  REFERRING DIAG: s/p R TKA due to OA  THERAPY DIAG:  Chronic pain of right knee  Acute pain of right knee  Localized edema  Muscle weakness (generalized)  Rationale for Evaluation and Treatment: Rehabilitation  ONSET DATE: 05/13/24  SUBJECTIVE:   SUBJECTIVE STATEMENT: Having the most pain in the shin and quad  PERTINENT HISTORY: Underwent TKA 2 weeks ago, referred to outpt PT PAIN:  Are you having pain? Yes: NPRS scale: 0/10 Pain location: R knee R medial ankle  Pain description: deep pain Aggravating factors: walking worse Relieving factors: meds  PRECAUTIONS: Fall  RED FLAGS: None   WEIGHT BEARING RESTRICTIONS: No  FALLS:  Has patient fallen in last 6 months? No  LIVING ENVIRONMENT: Lives with: lives with an adult companion and lives with their partner Lives in: House/apartment Stairs: Yes: Internal: 11 steps; on right going up Has following equipment at home: Single point cane and Walker - 2 wheeled  OCCUPATION: disability  PLOF: Independent  PATIENT GOALS: no pain  NEXT MD VISIT: 4 weeks, mid Novemeber  OBJECTIVE:  Note: Objective measures were completed at Evaluation unless otherwise noted.  DIAGNOSTIC FINDINGS: na  PATIENT SURVEYS:  KOOS, JR. Score: 25 / 28, Interval Score: 20.941 / 100  COGNITION: Overall cognitive status: Within functional limits for tasks assessed     SENSATION: WFL  EDEMA:  Mod edema R knee, bulbous appearance, steri strips at intervals across R knee incision, bruising med and lat patella, no abnormal warmth noted    POSTURE: leans forward in standing, maintains R knee extended with sitting and standing, any transitional movements  PALPATION: Good R patellar mobility, very   LOWER EXTREMITY ROM:  Passive ROM Right eval Right 06/12/24  Hip flexion    Hip extension    Hip abduction    Hip adduction    Hip internal rotation    Hip external rotation     Knee flexion 55 70/ P 85  Knee extension -10 20  Ankle dorsiflexion    Ankle plantarflexion    Ankle inversion    Ankle eversion     (Blank rows = not tested)  LOWER EXTREMITY MMT: R quad set trace, needs total assist to initiate and perform SLR R R hamstrings 2/5, ankle unable to test due to pain L LE wfl   FUNCTIONAL TESTS:  Timed up and go (TUG): greater than 40 sec, with front wheeled walker  GAIT: Distance walked: in clinic step to pattern, externally rotates R lower leg, ankle antalgic on R tends to flex entire trunk Assistive device utilized: Walker - 2 wheeled Level of assistance: SBA  TREATMENT DATE:  06/12/24 NuStep L 4 x 4 min R knee PROM w/ End range hold   Patella Mobs  Sit to stands 2x5 RLE LAQ 2x10 06/03/24 NuStep L1 x25mins LE  PROM to R knee, patellar mobs, end range holds to her tolerance  Quad set 5s x5 LAQ x10 Heel slides x10 SAQ 2x10 VASO med pressure, 56F x10 mins    05/27/24:  Evaluation, education throughout regarding goals, POC, expected outcomes.  Educated and demonstrated with pt elevation over her heart on R to reduce edema.  Instructed in the exercises as listed below, education regarding scarring and pushing R knee flexion motion as much as possible   PATIENT EDUCATION:  Education details: POC, goals  Person educated: Patient Education method: Explanation, Demonstration, Tactile cues, Verbal cues, and Handouts Education comprehension: verbalized understanding and returned demonstration  HOME EXERCISE PROGRAM: Access Code: [email or web address] URL: [email or web address] Date: 05/27/2024 Prepared by: Amy Speaks  Exercises - Supine Quad Set on Towel Roll  - 1 x daily - 7 x weekly - 3 sets - 10 reps - Supine Heel Slide with Strap  - 1 x daily - 7 x weekly - 3 sets - 10 reps - Hooklying Straight Leg Raise   - 1 x daily - 7 x weekly - 3 sets - 10 reps - Lunge on Step  - 1 x daily - 7 x weekly - 3 sets - 10 reps  ASSESSMENT:  CLINICAL IMPRESSION: Patient is a 58 y.o. female who was seen today by physical therapy for recovery following R TKA.  She enters ~ 14 minutes late. She is experiencing elevated pain levels still. Patient remains very limited with R knee flexion ROM, as well as tolerance of this motion. She repots the MD has mentioned manipulation. She was encouraged to complete HEP and really stretch her knee.  She reports it feels a little better at end of session. She will benefit from physical therapy to assist her with regaining maximal function as she recovers from this surgery.     OBJECTIVE IMPAIRMENTS: decreased activity tolerance, decreased balance, decreased endurance, decreased knowledge of condition, decreased knowledge of use of DME, decreased mobility, difficulty walking, decreased ROM, decreased strength, increased edema, increased fascial restrictions, impaired perceived functional ability, impaired flexibility, and pain.   ACTIVITY LIMITATIONS: carrying, lifting, bending, sitting, standing, squatting, sleeping, stairs, transfers, bed mobility, bathing, toileting, dressing, locomotion level, and caring for others  PARTICIPATION LIMITATIONS: meal prep, cleaning, laundry, shopping, community activity, and yard work  PERSONAL FACTORS: Education, Fitness, Time since onset of injury/illness/exacerbation, and 1-2 comorbidities: COPD, depression are also affecting patient's functional outcome.   REHAB POTENTIAL: Fair comorbidities  CLINICAL DECISION MAKING: Unstable/unpredictable  EVALUATION COMPLEXITY: Moderate   GOALS: Goals reviewed with patient? Yes  SHORT TERM GOALS: Target date: 2 weeks 06/10/24 I HEP  Baseline:initiated Goal status: on going 06/12/24   LONG TERM GOALS: Target date: 12 weeks, 08/19/24  KOOS, JR. Score: 25 / 28, Interval Score: 20.941 / 100 improve to  5/28 Baseline:  Goal status: INITIAL  2.  TUG less than 14 sec with LAD Baseline: greater than 40 sec with r walker Goal status: INITIAL  3.  ROM R knee 110 to -5 Baseline: 55 to -10 Goal status: INITIAL  4.  LE strength R 5/5, long arc quad and SLR without lag to prevent buckling, falls  Baseline:  Goal status: INITIAL    PLAN:  PT FREQUENCY: 2x/week  PT DURATION: 12 weeks  PLANNED INTERVENTIONS: 97110-Therapeutic exercises, 97530-  Therapeutic activity, [id number]- Neuromuscular re-education, [phone]- Self Care, [id number]- Manual therapy, [id number]- Vasopneumatic device, Patient/Family education, Cryotherapy, and Moist heat  PLAN FOR NEXT SESSION: manual techniques, utilize Nustep or recumbent cycle, progress/ encourage ROM and motor recruitment R LE   Tanda KANDICE Sorrow, PTA, 06/12/2024, 10:30 AM

## 2024-06-14 ENCOUNTER — Ambulatory Visit: Admitting: Physical Therapy

## 2024-06-18 ENCOUNTER — Ambulatory Visit: Admitting: Physical Therapy

## 2024-06-21 ENCOUNTER — Ambulatory Visit: Admitting: Physical Therapy

## 2024-06-21 ENCOUNTER — Encounter: Payer: Self-pay | Admitting: Physical Therapy

## 2024-06-21 DIAGNOSIS — M25561 Pain in right knee: Secondary | ICD-10-CM | POA: Diagnosis not present

## 2024-06-21 DIAGNOSIS — R600 Localized edema: Secondary | ICD-10-CM

## 2024-06-21 DIAGNOSIS — M6281 Muscle weakness (generalized): Secondary | ICD-10-CM

## 2024-06-21 DIAGNOSIS — R262 Difficulty in walking, not elsewhere classified: Secondary | ICD-10-CM

## 2024-06-21 DIAGNOSIS — G8929 Other chronic pain: Secondary | ICD-10-CM

## 2024-06-21 NOTE — Therapy (Signed)
 OUTPATIENT PHYSICAL THERAPY LOWER EXTREMITY TREATMENT   Patient Name: RAWAN RIENDEAU MRN: [id number] DOB:23-Jan-1966, 58 y.o., female Today's Date: 06/21/2024  END OF SESSION:  PT End of Session - 06/21/24 1020     Visit Number 4    Date for Recertification  08/19/24    PT Start Time 1020    PT Stop Time 1100    PT Time Calculation (min) 40 min    Activity Tolerance Patient tolerated treatment well;Patient limited by pain    Behavior During Therapy [facility] for tasks assessed/performed           Past Medical History:  Diagnosis Date   Alcohol use disorder, moderate, dependence (HCC)    06-09-2020  per pt not taking naltrexone  as prescribed since august 2021, unable to afford it   Allergic rhinitis, seasonal    Arthritis    Asthma    Bipolar 1 disorder (HCC)    Chronic constipation    Chronic low back pain    Chronic pain of right knee    COPD (chronic obstructive pulmonary disease) (HCC) 2022   DDD (degenerative disc disease)    Depression    GERD (gastroesophageal reflux disease)    History of 2019 novel coronavirus disease (COVID-19) 05/2019   06-09-2020  per pt residual chronic cough and intermittant sob   History of gastric ulcer    Hyperlipidemia    Hypertension    MDD (major depressive disorder)    Migraine    Mild obstructive sleep apnea    sleep study in epic 01-22-2020, recommended pt to have cpap titrate ,  per pt due to pain have not gone back    Moderate persistent asthma    followed by pcp   Osteoarthritis    Post-COVID chronic cough    Pre-diabetes    Sciatica    Sleep apnea    SOB (shortness of breath)    06-09-2020  per pt intermittant due to residual from covid    Wears glasses    Wears glasses    Past Surgical History:  Procedure Laterality Date   BUNIONECTOMY Left 2011   HEEL SPUR SURGERY Right 2012   KNEE ARTHROSCOPY WITH ANTERIOR CRUCIATE LIGAMENT (ACL) REPAIR WITH HAMSTRING GRAFT Right 01/31/2014   Procedure: RIGHT KNEE ARTHROSCOPY  WITH ALLOGRAFT (ACL) ANTERIOR CRUCIATE LIGAMENT RECONSTRUCTON PARTIAL MENISCECTOMY VERSES REPAIR ;  Surgeon: Lamar Collet, MD;  Location: [facility] [place];  Service: Orthopedics;  Laterality: Right;   PLANTAR FASCIA RELEASE Right 06/15/2020   Procedure: ENDOSCOPIC PLANTAR FASCIOTOMY WITH EXCISION OF PAINFUL SCAR;  Surgeon: Burt Fus, DPM;  Location: [facility];  Service: Podiatry;  Laterality: Right;  LOCAL   TOTAL ABDOMINAL HYSTERECTOMY W/ BILATERAL SALPINGOOPHORECTOMY  04-20-2006   TOTAL KNEE ARTHROPLASTY Right 05/13/2024   Procedure: RIGHT TOTAL KNEE ARTHROPLASTY;  Surgeon: Rubie Kemps, MD;  Location: [facility];  Service: Orthopedics;  Laterality: Right;   Patient Active Problem List   Diagnosis Date Noted   Arthropathy of lumbar facet joint 09/05/2022   Essential hypertension 02/25/2021   Migraine with aura 02/25/2021   Mild intermittent asthma 02/25/2021   Mixed hyperlipidemia 02/25/2021   Osteoarthritis of knee 02/25/2021   Other intervertebral disc degeneration, lumbar region 02/25/2021   Vitamin D deficiency 02/25/2021   Esophageal dysphagia 11/05/2020   Odontogenic infection of jaw 06/10/2020   OSA (obstructive sleep apnea) 04/01/2020   Moderate persistent asthma with acute exacerbation 12/30/2019   Chronic coughing 12/30/2019   Seasonal allergies 12/30/2019   Loud  snoring 12/16/2019   Prediabetes 01/04/2019   Obesity (BMI 30-39.9) 01/04/2019   Ganglion cyst 09/28/2018   Carpal tunnel syndrome of right wrist 09/28/2018   Chronic midline low back pain without sciatica 11/23/2017   Elevated blood pressure reading with diagnosis of hypertension 11/23/2017   Chronic pain of right knee 11/23/2017   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   Marijuana abuse 11/23/2017   Tobacco dependence 11/23/2017   Alcohol abuse 07/30/2016   S/P ACL reconstruction 01/31/2014   DEPRESSION 11/17/2008   ALLERGIC RHINITIS 11/17/2008   GERD 11/17/2008    PCP:  Cleotilde Bernardino Hutchinson PA  REFERRING PROVIDER: Marcey Raman, MD  REFERRING DIAG: s/p R TKA due to OA  THERAPY DIAG:  Chronic pain of right knee  Acute pain of right knee  Localized edema  Muscle weakness (generalized)  Difficulty in walking, not elsewhere classified  Rationale for Evaluation and Treatment: Rehabilitation  ONSET DATE: 05/13/24  SUBJECTIVE:   SUBJECTIVE STATEMENT: Knee is stiff today,   PERTINENT HISTORY: Underwent TKA 2 weeks ago, referred to outpt PT PAIN:  Are you having pain? Yes: NPRS scale: 9/10 Pain location: R knee R medial ankle  Pain description: deep pain Aggravating factors: walking worse Relieving factors: meds  PRECAUTIONS: Fall  RED FLAGS: None   WEIGHT BEARING RESTRICTIONS: No  FALLS:  Has patient fallen in last 6 months? No  LIVING ENVIRONMENT: Lives with: lives with an adult companion and lives with their partner Lives in: House/apartment Stairs: Yes: Internal: 11 steps; on right going up Has following equipment at home: Single point cane and Walker - 2 wheeled  OCCUPATION: disability  PLOF: Independent  PATIENT GOALS: no pain  NEXT MD VISIT: 4 weeks, mid Novemeber  OBJECTIVE:  Note: Objective measures were completed at Evaluation unless otherwise noted.  DIAGNOSTIC FINDINGS: na  PATIENT SURVEYS:  KOOS, JR. Score: 25 / 28, Interval Score: 20.941 / 100  COGNITION: Overall cognitive status: Within functional limits for tasks assessed     SENSATION: WFL  EDEMA:  Mod edema R knee, bulbous appearance, steri strips at intervals across R knee incision, bruising med and lat patella, no abnormal warmth noted    POSTURE: leans forward in standing, maintains R knee extended with sitting and standing, any transitional movements  PALPATION: Good R patellar mobility, very   LOWER EXTREMITY ROM:  Passive ROM Right eval Right 06/12/24 Right 06/21/24  Hip flexion     Hip extension     Hip abduction     Hip adduction      Hip internal rotation     Hip external rotation     Knee flexion 55 70/ P 85 86  Knee extension -10 20 19   Ankle dorsiflexion     Ankle plantarflexion     Ankle inversion     Ankle eversion      (Blank rows = not tested)  LOWER EXTREMITY MMT: R quad set trace, needs total assist to initiate and perform SLR R R hamstrings 2/5, ankle unable to test due to pain L LE wfl   FUNCTIONAL TESTS:  Timed up and go (TUG): greater than 40 sec, with front wheeled walker  GAIT: Distance walked: in clinic step to pattern, externally rotates R lower leg, ankle antalgic on R tends to flex entire trunk Assistive device utilized: Walker - 2 wheeled Level of assistance: SBA  TREATMENT DATE:  06/21/24 NuStep L 5 x 5 min NuStep L 4 x 4 min R knee PROM w/ End range hold   Patella Mobs  LAQ RLE 2x10 HS curls yellow 2x10 RLE  Sit to stands 2x10  06/12/24 NuStep L 4 x 4 min R knee PROM w/ End range hold   Patella Mobs  Sit to stands 2x5 RLE LAQ 2x10 06/03/24 NuStep L1 x13mins LE  PROM to R knee, patellar mobs, end range holds to her tolerance  Quad set 5s x5 LAQ x10 Heel slides x10 SAQ 2x10 VASO med pressure, 58F x10 mins    05/27/24:  Evaluation, education throughout regarding goals, POC, expected outcomes.  Educated and demonstrated with pt elevation over her heart on R to reduce edema.  Instructed in the exercises as listed below, education regarding scarring and pushing R knee flexion motion as much as possible   PATIENT EDUCATION:  Education details: POC, goals  Person educated: Patient Education method: Explanation, Demonstration, Tactile cues, Verbal cues, and Handouts Education comprehension: verbalized understanding and returned demonstration  HOME EXERCISE PROGRAM: Access Code: [email or web address] URL: [email or web address] Date:  05/27/2024 Prepared by: Amy Speaks  Exercises - Supine Quad Set on Towel Roll  - 1 x daily - 7 x weekly - 3 sets - 10 reps - Supine Heel Slide with Strap  - 1 x daily - 7 x weekly - 3 sets - 10 reps - Hooklying Straight Leg Raise  - 1 x daily - 7 x weekly - 3 sets - 10 reps - Lunge on Step  - 1 x daily - 7 x weekly - 3 sets - 10 reps  ASSESSMENT:  CLINICAL IMPRESSION: Patient is a 58 y.o. female who was seen today by physical therapy for recovery following R TKA.  Pt enters  with 9/10 pain rating, no improvement made with ROM. Pt doe not tolerated activity well, not allowing much stretching. LAQ caused quad cramping. Expressed the importance of HEP and stretching at home.   OBJECTIVE IMPAIRMENTS: decreased activity tolerance, decreased balance, decreased endurance, decreased knowledge of condition, decreased knowledge of use of DME, decreased mobility, difficulty walking, decreased ROM, decreased strength, increased edema, increased fascial restrictions, impaired perceived functional ability, impaired flexibility, and pain.   ACTIVITY LIMITATIONS: carrying, lifting, bending, sitting, standing, squatting, sleeping, stairs, transfers, bed mobility, bathing, toileting, dressing, locomotion level, and caring for others  PARTICIPATION LIMITATIONS: meal prep, cleaning, laundry, shopping, community activity, and yard work  PERSONAL FACTORS: Education, Fitness, Time since onset of injury/illness/exacerbation, and 1-2 comorbidities: COPD, depression are also affecting patient's functional outcome.   REHAB POTENTIAL: Fair comorbidities  CLINICAL DECISION MAKING: Unstable/unpredictable  EVALUATION COMPLEXITY: Moderate   GOALS: Goals reviewed with patient? Yes  SHORT TERM GOALS: Target date: 2 weeks 06/10/24 I HEP  Baseline:initiated Goal status: on going 06/12/24, ongoing 06/21/24   LONG TERM GOALS: Target date: 12 weeks, 08/19/24  KOOS, JR. Score: 25 / 28, Interval Score: 20.941 / 100  improve to 5/28 Baseline:  Goal status: INITIAL  2.  TUG less than 14 sec with LAD Baseline: greater than 40 sec with r walker Goal status: INITIAL  3.  ROM R knee 110 to -5 Baseline: 55 to -10 Goal status: ongoing 06/21/24  4.  LE strength R 5/5, long arc quad and SLR without lag to prevent buckling, falls  Baseline:  Goal status: INITIAL    PLAN:  PT FREQUENCY: 2x/week  PT DURATION: 12 weeks  PLANNED INTERVENTIONS: 97110-Therapeutic exercises, 97530- Therapeutic activity, 97112-  Neuromuscular re-education, [phone]- Self Care, [id number]- Manual therapy, [id number]- Vasopneumatic device, Patient/Family education, Cryotherapy, and Moist heat  PLAN FOR NEXT SESSION: manual techniques, utilize Nustep or recumbent cycle, progress/ encourage ROM and motor recruitment R LE   Tanda KANDICE Sorrow, PTA, 06/21/2024, 10:21 AM

## 2024-06-25 ENCOUNTER — Ambulatory Visit

## 2024-06-25 DIAGNOSIS — R2689 Other abnormalities of gait and mobility: Secondary | ICD-10-CM

## 2024-06-25 DIAGNOSIS — M25561 Pain in right knee: Secondary | ICD-10-CM

## 2024-06-25 DIAGNOSIS — G8929 Other chronic pain: Secondary | ICD-10-CM

## 2024-06-25 DIAGNOSIS — R600 Localized edema: Secondary | ICD-10-CM

## 2024-06-25 DIAGNOSIS — R262 Difficulty in walking, not elsewhere classified: Secondary | ICD-10-CM

## 2024-06-25 DIAGNOSIS — M6281 Muscle weakness (generalized): Secondary | ICD-10-CM

## 2024-06-25 NOTE — Therapy (Signed)
 OUTPATIENT PHYSICAL THERAPY LOWER EXTREMITY TREATMENT   Patient Name: [name] [name] MRN: [id number] DOB:Jun 19, 1966, 58 y.o., female Today's Date: 06/25/2024  END OF SESSION:  PT End of Session - 06/25/24 1058     Visit Number 5    Date for Recertification  08/19/24    PT Start Time 1015    PT Stop Time 1055    PT Time Calculation (min) 40 min    Activity Tolerance Patient limited by pain    Behavior During Therapy [facility] for tasks assessed/performed          Past Medical History:  Diagnosis Date   Alcohol use disorder, moderate, dependence (HCC)    06-09-2020  per pt not taking naltrexone  as prescribed since august 2021, unable to afford it   Allergic rhinitis, seasonal    Arthritis    Asthma    Bipolar 1 disorder (HCC)    Chronic constipation    Chronic low back pain    Chronic pain of right knee    COPD (chronic obstructive pulmonary disease) (HCC) 2022   DDD (degenerative disc disease)    Depression    GERD (gastroesophageal reflux disease)    History of 2019 novel coronavirus disease (COVID-19) 05/2019   06-09-2020  per pt residual chronic cough and intermittant sob   History of gastric ulcer    Hyperlipidemia    Hypertension    MDD (major depressive disorder)    Migraine    Mild obstructive sleep apnea    sleep study in epic 01-22-2020, recommended pt to have cpap titrate ,  per pt due to pain have not gone back    Moderate persistent asthma    followed by pcp   Osteoarthritis    Post-COVID chronic cough    Pre-diabetes    Sciatica    Sleep apnea    SOB (shortness of breath)    06-09-2020  per pt intermittant due to residual from covid    Wears glasses    Wears glasses    Past Surgical History:  Procedure Laterality Date   BUNIONECTOMY Left 2011   HEEL SPUR SURGERY Right 2012   KNEE ARTHROSCOPY WITH ANTERIOR CRUCIATE LIGAMENT (ACL) REPAIR WITH HAMSTRING GRAFT Right 01/31/2014   Procedure: RIGHT KNEE ARTHROSCOPY WITH ALLOGRAFT (ACL) ANTERIOR  CRUCIATE LIGAMENT RECONSTRUCTON PARTIAL MENISCECTOMY VERSES REPAIR ;  Surgeon: Lamar Collet, MD;  Location: [facility];  Service: Orthopedics;  Laterality: Right;   PLANTAR FASCIA RELEASE Right 06/15/2020   Procedure: ENDOSCOPIC PLANTAR FASCIOTOMY WITH EXCISION OF PAINFUL SCAR;  Surgeon: Burt Fus, DPM;  Location: [facility];  Service: Podiatry;  Laterality: Right;  LOCAL   TOTAL ABDOMINAL HYSTERECTOMY W/ BILATERAL SALPINGOOPHORECTOMY  04-20-2006   TOTAL KNEE ARTHROPLASTY Right 05/13/2024   Procedure: RIGHT TOTAL KNEE ARTHROPLASTY;  Surgeon: Rubie Kemps, MD;  Location: [facility];  Service: Orthopedics;  Laterality: Right;   Patient Active Problem List   Diagnosis Date Noted   Arthropathy of lumbar facet joint 09/05/2022   Essential hypertension 02/25/2021   Migraine with aura 02/25/2021   Mild intermittent asthma 02/25/2021   Mixed hyperlipidemia 02/25/2021   Osteoarthritis of knee 02/25/2021   Other intervertebral disc degeneration, lumbar region 02/25/2021   Vitamin D deficiency 02/25/2021   Esophageal dysphagia 11/05/2020   Odontogenic infection of jaw 06/10/2020   OSA (obstructive sleep apnea) 04/01/2020   Moderate persistent asthma with acute exacerbation 12/30/2019   Chronic coughing 12/30/2019   Seasonal allergies 12/30/2019   Loud snoring 12/16/2019  Prediabetes 01/04/2019   Obesity (BMI 30-39.9) 01/04/2019   Ganglion cyst 09/28/2018   Carpal tunnel syndrome of right wrist 09/28/2018   Chronic midline low back pain without sciatica 11/23/2017   Elevated blood pressure reading with diagnosis of hypertension 11/23/2017   Chronic pain of right knee 11/23/2017   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   Marijuana abuse 11/23/2017   Tobacco dependence 11/23/2017   Alcohol abuse 07/30/2016   S/P ACL reconstruction 01/31/2014   DEPRESSION 11/17/2008   ALLERGIC RHINITIS 11/17/2008   GERD 11/17/2008    PCP: Cleotilde Bernardino Hutchinson  PA  REFERRING PROVIDER: Marcey Raman, MD  REFERRING DIAG: s/p R TKA due to OA  THERAPY DIAG:  Chronic pain of right knee  Acute pain of right knee  Localized edema  Muscle weakness (generalized)  Difficulty in walking, not elsewhere classified  Other abnormalities of gait and mobility  Rationale for Evaluation and Treatment: Rehabilitation  ONSET DATE: 05/13/24  SUBJECTIVE:   SUBJECTIVE STATEMENT: Pt reported hamstring tightness in the RLE and knee pain in the RLE. Pt states her knee is constantly sore so it has been hard to complete HEP.   PERTINENT HISTORY: Underwent TKA 2 weeks ago, referred to outpt PT PAIN:  Are you having pain? Yes: NPRS scale: 9/10 Pain location: R knee R medial ankle  Pain description: deep pain Aggravating factors: walking worse Relieving factors: meds  PRECAUTIONS: Fall  RED FLAGS: None   WEIGHT BEARING RESTRICTIONS: No  FALLS:  Has patient fallen in last 6 months? No  LIVING ENVIRONMENT: Lives with: lives with an adult companion and lives with their partner Lives in: House/apartment Stairs: Yes: Internal: 11 steps; on right going up Has following equipment at home: Single point cane and Walker - 2 wheeled  OCCUPATION: disability  PLOF: Independent  PATIENT GOALS: no pain  NEXT MD VISIT: 4 weeks, mid Novemeber  OBJECTIVE:  Note: Objective measures were completed at Evaluation unless otherwise noted.  DIAGNOSTIC FINDINGS: na  PATIENT SURVEYS:  KOOS, JR. Score: 25 / 28, Interval Score: 20.941 / 100  COGNITION: Overall cognitive status: Within functional limits for tasks assessed     SENSATION: WFL  EDEMA:  Mod edema R knee, bulbous appearance, steri strips at intervals across R knee incision, bruising med and lat patella, no abnormal warmth noted    POSTURE: leans forward in standing, maintains R knee extended with sitting and standing, any transitional movements  PALPATION: Good R patellar mobility, very    LOWER EXTREMITY ROM:  Passive ROM Right eval Right 06/12/24 Right 06/25/24  Hip flexion     Hip extension     Hip abduction     Hip adduction     Hip internal rotation     Hip external rotation     Knee flexion 55 70/ P 85 90  Knee extension -10 20 20   Ankle dorsiflexion     Ankle plantarflexion     Ankle inversion     Ankle eversion      (Blank rows = not tested)  LOWER EXTREMITY MMT: R quad set trace, needs total assist to initiate and perform SLR R R hamstrings 2/5, ankle unable to test due to pain L LE wfl   FUNCTIONAL TESTS:  Timed up and go (TUG): greater than 40 sec, with front wheeled walker  GAIT: Distance walked: in clinic step to pattern, externally rotates R lower leg, ankle antalgic on R tends to flex entire trunk Assistive device utilized: Walker - 2 wheeled Level of  assistance: SBA                                                                                                                                TREATMENT DATE:  06/25/24 NuStep L4x64min Reassessment of RLE ROM AAROM RLE flex/ext Hamstring Stretch 1 trial; Hip flexion stretch 1 trial  Clam Shells 2x10 yellow resistance band Hamstring Curls Red resistance band 2x10 Toe Taps 4 1x10 Step Ups 4 1x10   06/21/24 NuStep L 5 x 5 min NuStep L 4 x 4 min R knee PROM w/ End range hold   Patella Mobs  LAQ RLE 2x10 HS curls yellow 2x10 RLE  Sit to stands 2x10  06/12/24 NuStep L 4 x 4 min R knee PROM w/ End range hold   Patella Mobs  Sit to stands 2x5 RLE LAQ 2x10 06/03/24 NuStep L1 x15mins LE  PROM to R knee, patellar mobs, end range holds to her tolerance  Quad set 5s x5 LAQ x10 Heel slides x10 SAQ 2x10 VASO med pressure, 52F x10 mins    05/27/24:  Evaluation, education throughout regarding goals, POC, expected outcomes.  Educated and demonstrated with pt elevation over her heart on R to reduce edema.  Instructed in the exercises as listed below, education regarding scarring and  pushing R knee flexion motion as much as possible   PATIENT EDUCATION:  Education details: POC, goals  Person educated: Patient Education method: Explanation, Demonstration, Tactile cues, Verbal cues, and Handouts Education comprehension: verbalized understanding and returned demonstration  HOME EXERCISE PROGRAM: Access Code: [email or web address] URL: [email or web address] Date: 05/27/2024 Prepared by: Amy Speaks  Exercises - Supine Quad Set on Towel Roll  - 1 x daily - 7 x weekly - 3 sets - 10 reps - Supine Heel Slide with Strap  - 1 x daily - 7 x weekly - 3 sets - 10 reps - Hooklying Straight Leg Raise  - 1 x daily - 7 x weekly - 3 sets - 10 reps - Lunge on Step  - 1 x daily - 7 x weekly - 3 sets - 10 reps  ASSESSMENT:  CLINICAL IMPRESSION: Pt exhibits pain symptoms in the RLE that limits prolonged dynamic activity. Pt requires rest periods between activities for recovery. Pt exhibits decreased ROM in flex/ext of RLE with presence of swelling around the knee joint. Pt limited due to pain to light exercises and light stretching before voicing pain is increased. Pt will benefit from continued PT to increase ROM and strength in RLE in order to improve ambulation quality.   Patient is a 58 y.o. female who was seen today by physical therapy for recovery following R TKA.  Pt enters  with 9/10 pain rating, no improvement made with ROM. Pt doe not tolerated activity well, not allowing much stretching. LAQ caused quad cramping. Expressed the importance of HEP and stretching at home.   OBJECTIVE IMPAIRMENTS: decreased activity tolerance, decreased balance, decreased endurance, decreased knowledge of  condition, decreased knowledge of use of DME, decreased mobility, difficulty walking, decreased ROM, decreased strength, increased edema, increased fascial restrictions, impaired perceived functional ability, impaired flexibility, and pain.   ACTIVITY LIMITATIONS: carrying, lifting, bending,  sitting, standing, squatting, sleeping, stairs, transfers, bed mobility, bathing, toileting, dressing, locomotion level, and caring for others  PARTICIPATION LIMITATIONS: meal prep, cleaning, laundry, shopping, community activity, and yard work  PERSONAL FACTORS: Education, Fitness, Time since onset of injury/illness/exacerbation, and 1-2 comorbidities: COPD, depression are also affecting patient's functional outcome.   REHAB POTENTIAL: Fair comorbidities  CLINICAL DECISION MAKING: Unstable/unpredictable  EVALUATION COMPLEXITY: Moderate   GOALS: Goals reviewed with patient? Yes  SHORT TERM GOALS: Target date: 2 weeks 06/10/24 I HEP  Baseline:initiated Goal status: on going 06/12/24, ongoing 06/25/24   LONG TERM GOALS: Target date: 12 weeks, 08/19/24  KOOS, JR. Score: 25 / 28, Interval Score: 20.941 / 100 improve to 5/28 Baseline:  Goal status: INITIAL  2.  TUG less than 14 sec with LAD Baseline: greater than 40 sec with r walker Goal status: INITIAL  3.  ROM R knee 110 to -5 Baseline: 55 to -10 Goal status: ongoing 06/21/24; 90 to -20 06/25/24  4.  LE strength R 5/5, long arc quad and SLR without lag to prevent buckling, falls  Baseline:  Goal status: INITIAL    PLAN:  PT FREQUENCY: 2x/week  PT DURATION: 12 weeks  PLANNED INTERVENTIONS: 97110-Therapeutic exercises, 97530- Therapeutic activity, 97112- Neuromuscular re-education, 97535- Self Care, 02859- Manual therapy, 97016- Vasopneumatic device, Patient/Family education, Cryotherapy, and Moist heat  PLAN FOR NEXT SESSION: manual techniques, utilize Nustep or recumbent cycle, progress/ encourage ROM and motor recruitment R LE   [employer], Student-PT, 06/25/2024, 11:11 AM

## 2024-06-28 ENCOUNTER — Ambulatory Visit

## 2024-06-28 NOTE — Therapy (Incomplete)
 OUTPATIENT PHYSICAL THERAPY LOWER EXTREMITY TREATMENT   Patient Name: [name] [name] MRN: [id number] DOB:Jan 01, 1966, 58 y.o., female Today's Date: 06/28/2024  END OF SESSION:    Past Medical History:  Diagnosis Date   Alcohol use disorder, moderate, dependence (HCC)    06-09-2020  per pt not taking naltrexone  as prescribed since august 2021, unable to afford it   Allergic rhinitis, seasonal    Arthritis    Asthma    Bipolar 1 disorder (HCC)    Chronic constipation    Chronic low back pain    Chronic pain of right knee    COPD (chronic obstructive pulmonary disease) (HCC) 2022   DDD (degenerative disc disease)    Depression    GERD (gastroesophageal reflux disease)    History of 2019 novel coronavirus disease (COVID-19) 05/2019   06-09-2020  per pt residual chronic cough and intermittant sob   History of gastric ulcer    Hyperlipidemia    Hypertension    MDD (major depressive disorder)    Migraine    Mild obstructive sleep apnea    sleep study in epic 01-22-2020, recommended pt to have cpap titrate ,  per pt due to pain have not gone back    Moderate persistent asthma    followed by pcp   Osteoarthritis    Post-COVID chronic cough    Pre-diabetes    Sciatica    Sleep apnea    SOB (shortness of breath)    06-09-2020  per pt intermittant due to residual from covid    Wears glasses    Wears glasses    Past Surgical History:  Procedure Laterality Date   BUNIONECTOMY Left 2011   HEEL SPUR SURGERY Right 2012   KNEE ARTHROSCOPY WITH ANTERIOR CRUCIATE LIGAMENT (ACL) REPAIR WITH HAMSTRING GRAFT Right 01/31/2014   Procedure: RIGHT KNEE ARTHROSCOPY WITH ALLOGRAFT (ACL) ANTERIOR CRUCIATE LIGAMENT RECONSTRUCTON PARTIAL MENISCECTOMY VERSES REPAIR ;  Surgeon: Lamar Collet, MD;  Location: [facility];  Service: Orthopedics;  Laterality: Right;   PLANTAR FASCIA RELEASE Right 06/15/2020   Procedure: ENDOSCOPIC PLANTAR FASCIOTOMY WITH EXCISION OF PAINFUL SCAR;   Surgeon: Burt Fus, DPM;  Location: [facility];  Service: Podiatry;  Laterality: Right;  LOCAL   TOTAL ABDOMINAL HYSTERECTOMY W/ BILATERAL SALPINGOOPHORECTOMY  04-20-2006   TOTAL KNEE ARTHROPLASTY Right 05/13/2024   Procedure: RIGHT TOTAL KNEE ARTHROPLASTY;  Surgeon: Rubie Kemps, MD;  Location: [facility];  Service: Orthopedics;  Laterality: Right;   Patient Active Problem List   Diagnosis Date Noted   Arthropathy of lumbar facet joint 09/05/2022   Essential hypertension 02/25/2021   Migraine with aura 02/25/2021   Mild intermittent asthma 02/25/2021   Mixed hyperlipidemia 02/25/2021   Osteoarthritis of knee 02/25/2021   Other intervertebral disc degeneration, lumbar region 02/25/2021   Vitamin D deficiency 02/25/2021   Esophageal dysphagia 11/05/2020   Odontogenic infection of jaw 06/10/2020   OSA (obstructive sleep apnea) 04/01/2020   Moderate persistent asthma with acute exacerbation 12/30/2019   Chronic coughing 12/30/2019   Seasonal allergies 12/30/2019   Loud snoring 12/16/2019   Prediabetes 01/04/2019   Obesity (BMI 30-39.9) 01/04/2019   Ganglion cyst 09/28/2018   Carpal tunnel syndrome of right wrist 09/28/2018   Chronic midline low back pain without sciatica 11/23/2017   Elevated blood pressure reading with diagnosis of hypertension 11/23/2017   Chronic pain of right knee 11/23/2017   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   Marijuana abuse 11/23/2017   Tobacco dependence 11/23/2017   Alcohol abuse  07/30/2016   S/P ACL reconstruction 01/31/2014   DEPRESSION 11/17/2008   ALLERGIC RHINITIS 11/17/2008   GERD 11/17/2008    PCP: Cleotilde Bernardino Hutchinson PA  REFERRING PROVIDER: Marcey Raman, MD  REFERRING DIAG: s/p R TKA due to OA  THERAPY DIAG:  No diagnosis found.  Rationale for Evaluation and Treatment: Rehabilitation  ONSET DATE: 05/13/24  SUBJECTIVE:   SUBJECTIVE STATEMENT: Pt reported hamstring tightness in the RLE and knee pain in the RLE.  Pt states her knee is constantly sore so it has been hard to complete HEP.   PERTINENT HISTORY: Underwent TKA 2 weeks ago, referred to outpt PT PAIN:  Are you having pain? Yes: NPRS scale: 9/10 Pain location: R knee R medial ankle  Pain description: deep pain Aggravating factors: walking worse Relieving factors: meds  PRECAUTIONS: Fall  RED FLAGS: None   WEIGHT BEARING RESTRICTIONS: No  FALLS:  Has patient fallen in last 6 months? No  LIVING ENVIRONMENT: Lives with: lives with an adult companion and lives with their partner Lives in: House/apartment Stairs: Yes: Internal: 11 steps; on right going up Has following equipment at home: Single point cane and Walker - 2 wheeled  OCCUPATION: disability  PLOF: Independent  PATIENT GOALS: no pain  NEXT MD VISIT: 4 weeks, mid Novemeber  OBJECTIVE:  Note: Objective measures were completed at Evaluation unless otherwise noted.  DIAGNOSTIC FINDINGS: na  PATIENT SURVEYS:  KOOS, JR. Score: 25 / 28, Interval Score: 20.941 / 100  COGNITION: Overall cognitive status: Within functional limits for tasks assessed     SENSATION: WFL  EDEMA:  Mod edema R knee, bulbous appearance, steri strips at intervals across R knee incision, bruising med and lat patella, no abnormal warmth noted    POSTURE: leans forward in standing, maintains R knee extended with sitting and standing, any transitional movements  PALPATION: Good R patellar mobility, very   LOWER EXTREMITY ROM:  Passive ROM Right eval Right 06/12/24 Right 06/25/24  Hip flexion     Hip extension     Hip abduction     Hip adduction     Hip internal rotation     Hip external rotation     Knee flexion 55 70/ P 85 90  Knee extension -10 20 20   Ankle dorsiflexion     Ankle plantarflexion     Ankle inversion     Ankle eversion      (Blank rows = not tested)  LOWER EXTREMITY MMT: R quad set trace, needs total assist to initiate and perform SLR R R hamstrings 2/5,  ankle unable to test due to pain L LE wfl   FUNCTIONAL TESTS:  Timed up and go (TUG): greater than 40 sec, with front wheeled walker  GAIT: Distance walked: in clinic step to pattern, externally rotates R lower leg, ankle antalgic on R tends to flex entire trunk Assistive device utilized: Walker - 2 wheeled Level of assistance: SBA  TREATMENT DATE:  06/28/24 NuStep Calf stretch Calf raises Step ups 4 PROM to R knee with end range holds  LAQ 2# 2x10 HS curls red 2x10 STS   06/25/24 NuStep L4x20min Reassessment of RLE ROM AAROM RLE flex/ext Hamstring Stretch 1 trial; Hip flexion stretch 1 trial  Clam Shells 2x10 yellow resistance band Hamstring Curls Red resistance band 2x10 Toe Taps 4 1x10 Step Ups 4 1x10   06/21/24 NuStep L 5 x 5 min NuStep L 4 x 4 min R knee PROM w/ End range hold   Patella Mobs  LAQ RLE 2x10 HS curls yellow 2x10 RLE  Sit to stands 2x10  06/12/24 NuStep L 4 x 4 min R knee PROM w/ End range hold   Patella Mobs  Sit to stands 2x5 RLE LAQ 2x10 06/03/24 NuStep L1 x54mins LE  PROM to R knee, patellar mobs, end range holds to her tolerance  Quad set 5s x5 LAQ x10 Heel slides x10 SAQ 2x10 VASO med pressure, 65F x10 mins    05/27/24:  Evaluation, education throughout regarding goals, POC, expected outcomes.  Educated and demonstrated with pt elevation over her heart on R to reduce edema.  Instructed in the exercises as listed below, education regarding scarring and pushing R knee flexion motion as much as possible   PATIENT EDUCATION:  Education details: POC, goals  Person educated: Patient Education method: Explanation, Demonstration, Tactile cues, Verbal cues, and Handouts Education comprehension: verbalized understanding and returned demonstration  HOME EXERCISE PROGRAM: Access Code: [email or web address] URL:  [email or web address] Date: 05/27/2024 Prepared by: Amy Speaks  Exercises - Supine Quad Set on Towel Roll  - 1 x daily - 7 x weekly - 3 sets - 10 reps - Supine Heel Slide with Strap  - 1 x daily - 7 x weekly - 3 sets - 10 reps - Hooklying Straight Leg Raise  - 1 x daily - 7 x weekly - 3 sets - 10 reps - Lunge on Step  - 1 x daily - 7 x weekly - 3 sets - 10 reps  ASSESSMENT:  CLINICAL IMPRESSION: Pt exhibits pain symptoms in the RLE that limits prolonged dynamic activity. Pt requires rest periods between activities for recovery. Pt exhibits decreased ROM in flex/ext of RLE with presence of swelling around the knee joint. Pt limited due to pain to light exercises and light stretching before voicing pain is increased. Pt will benefit from continued PT to increase ROM and strength in RLE in order to improve ambulation quality.   Patient is a 58 y.o. female who was seen today by physical therapy for recovery following R TKA.  Pt enters  with 9/10 pain rating, no improvement made with ROM. Pt doe not tolerated activity well, not allowing much stretching. LAQ caused quad cramping. Expressed the importance of HEP and stretching at home.   OBJECTIVE IMPAIRMENTS: decreased activity tolerance, decreased balance, decreased endurance, decreased knowledge of condition, decreased knowledge of use of DME, decreased mobility, difficulty walking, decreased ROM, decreased strength, increased edema, increased fascial restrictions, impaired perceived functional ability, impaired flexibility, and pain.   ACTIVITY LIMITATIONS: carrying, lifting, bending, sitting, standing, squatting, sleeping, stairs, transfers, bed mobility, bathing, toileting, dressing, locomotion level, and caring for others  PARTICIPATION LIMITATIONS: meal prep, cleaning, laundry, shopping, community activity, and yard work  PERSONAL FACTORS: Education, Fitness, Time since onset of injury/illness/exacerbation, and 1-2  comorbidities: COPD, depression are also affecting patient's functional outcome.   REHAB POTENTIAL: Fair comorbidities  CLINICAL DECISION MAKING: Unstable/unpredictable  EVALUATION COMPLEXITY:  Moderate   GOALS: Goals reviewed with patient? Yes  SHORT TERM GOALS: Target date: 2 weeks 06/10/24 I HEP  Baseline:initiated Goal status: on going 06/12/24, ongoing 06/25/24   LONG TERM GOALS: Target date: 12 weeks, 08/19/24  KOOS, JR. Score: 25 / 28, Interval Score: 20.941 / 100 improve to 5/28 Baseline:  Goal status: INITIAL  2.  TUG less than 14 sec with LAD Baseline: greater than 40 sec with r walker Goal status: INITIAL  3.  ROM R knee 110 to -5 Baseline: 55 to -10 Goal status: ongoing 06/21/24; 90 to -20 06/25/24  4.  LE strength R 5/5, long arc quad and SLR without lag to prevent buckling, falls  Baseline:  Goal status: INITIAL    PLAN:  PT FREQUENCY: 2x/week  PT DURATION: 12 weeks  PLANNED INTERVENTIONS: 97110-Therapeutic exercises, 97530- Therapeutic activity, 97112- Neuromuscular re-education, 97535- Self Care, 02859- Manual therapy, 97016- Vasopneumatic device, Patient/Family education, Cryotherapy, and Moist heat  PLAN FOR NEXT SESSION: manual techniques, utilize Nustep or recumbent cycle, progress/ encourage ROM and motor recruitment R LE   [employer], PT, 06/28/2024, 8:05 AM

## 2024-07-02 ENCOUNTER — Ambulatory Visit

## 2024-07-12 ENCOUNTER — Ambulatory Visit: Admitting: Physical Therapy

## 2024-07-16 ENCOUNTER — Ambulatory Visit: Admitting: Physical Therapy

## 2024-07-19 ENCOUNTER — Ambulatory Visit: Admitting: Physical Therapy

## 2024-07-23 ENCOUNTER — Ambulatory Visit

## 2024-07-26 ENCOUNTER — Ambulatory Visit: Admitting: Physical Therapy

## 2024-07-26 DIAGNOSIS — M6281 Muscle weakness (generalized): Secondary | ICD-10-CM | POA: Insufficient documentation

## 2024-07-26 DIAGNOSIS — R2689 Other abnormalities of gait and mobility: Secondary | ICD-10-CM | POA: Insufficient documentation

## 2024-07-26 DIAGNOSIS — R262 Difficulty in walking, not elsewhere classified: Secondary | ICD-10-CM | POA: Insufficient documentation

## 2024-07-26 DIAGNOSIS — M25561 Pain in right knee: Secondary | ICD-10-CM | POA: Insufficient documentation

## 2024-07-26 DIAGNOSIS — R600 Localized edema: Secondary | ICD-10-CM | POA: Insufficient documentation

## 2024-07-26 DIAGNOSIS — G8929 Other chronic pain: Secondary | ICD-10-CM | POA: Insufficient documentation

## 2024-07-31 ENCOUNTER — Ambulatory Visit

## 2024-07-31 DIAGNOSIS — G8929 Other chronic pain: Secondary | ICD-10-CM

## 2024-07-31 DIAGNOSIS — M25561 Pain in right knee: Secondary | ICD-10-CM

## 2024-07-31 DIAGNOSIS — R600 Localized edema: Secondary | ICD-10-CM

## 2024-07-31 DIAGNOSIS — R2689 Other abnormalities of gait and mobility: Secondary | ICD-10-CM | POA: Diagnosis present

## 2024-07-31 DIAGNOSIS — M6281 Muscle weakness (generalized): Secondary | ICD-10-CM

## 2024-07-31 DIAGNOSIS — R262 Difficulty in walking, not elsewhere classified: Secondary | ICD-10-CM

## 2024-07-31 NOTE — Therapy (Signed)
 " OUTPATIENT PHYSICAL THERAPY LOWER EXTREMITY TREATMENT   Patient Name: [name] [name] MRN: [id number] DOB:[birth year], 58 y.o., female Today's Date: 07/31/2024  END OF SESSION:  PT End of Session - 07/31/24 1053     Visit Number 6    Date for Recertification  08/19/24    PT Start Time 1055    PT Stop Time 1140    PT Time Calculation (min) 45 min    Activity Tolerance Patient tolerated treatment well    Behavior During Therapy [facility] for tasks assessed/performed          Past Medical History:  Diagnosis Date   Alcohol use disorder, moderate, dependence (HCC)    06-09-2020  per pt not taking naltrexone  as prescribed since august 2021, unable to afford it   Allergic rhinitis, seasonal    Arthritis    Asthma    Bipolar 1 disorder (HCC)    Chronic constipation    Chronic low back pain    Chronic pain of right knee    COPD (chronic obstructive pulmonary disease) (HCC) 2022   DDD (degenerative disc disease)    Depression    GERD (gastroesophageal reflux disease)    History of 2019 novel coronavirus disease (COVID-19) 05/2019   06-09-2020  per pt residual chronic cough and intermittant sob   History of gastric ulcer    Hyperlipidemia    Hypertension    MDD (major depressive disorder)    Migraine    Mild obstructive sleep apnea    sleep study in epic 01-22-2020, recommended pt to have cpap titrate ,  per pt due to pain have not gone back    Moderate persistent asthma    followed by pcp   Osteoarthritis    Post-COVID chronic cough    Pre-diabetes    Sciatica    Sleep apnea    SOB (shortness of breath)    06-09-2020  per pt intermittant due to residual from covid    Wears glasses    Wears glasses    Past Surgical History:  Procedure Laterality Date   BUNIONECTOMY Left 2011   HEEL SPUR SURGERY Right 2012   KNEE ARTHROSCOPY WITH ANTERIOR CRUCIATE LIGAMENT (ACL) REPAIR WITH HAMSTRING GRAFT Right 01/31/2014   Procedure: RIGHT KNEE ARTHROSCOPY WITH ALLOGRAFT (ACL)  ANTERIOR CRUCIATE LIGAMENT RECONSTRUCTON PARTIAL MENISCECTOMY VERSES REPAIR ;  Surgeon: Lamar Collet, MD;  Location: [facility];  Service: Orthopedics;  Laterality: Right;   PLANTAR FASCIA RELEASE Right 06/15/2020   Procedure: ENDOSCOPIC PLANTAR FASCIOTOMY WITH EXCISION OF PAINFUL SCAR;  Surgeon: Burt Fus, DPM;  Location: [facility];  Service: Podiatry;  Laterality: Right;  LOCAL   TOTAL ABDOMINAL HYSTERECTOMY W/ BILATERAL SALPINGOOPHORECTOMY  04-20-2006   TOTAL KNEE ARTHROPLASTY Right 05/13/2024   Procedure: RIGHT TOTAL KNEE ARTHROPLASTY;  Surgeon: Rubie Kemps, MD;  Location: [facility];  Service: Orthopedics;  Laterality: Right;   Patient Active Problem List   Diagnosis Date Noted   Arthropathy of lumbar facet joint 09/05/2022   Essential hypertension 02/25/2021   Migraine with aura 02/25/2021   Mild intermittent asthma 02/25/2021   Mixed hyperlipidemia 02/25/2021   Osteoarthritis of knee 02/25/2021   Other intervertebral disc degeneration, lumbar region 02/25/2021   Vitamin D deficiency 02/25/2021   Esophageal dysphagia 11/05/2020   Odontogenic infection of jaw 06/10/2020   OSA (obstructive sleep apnea) 04/01/2020   Moderate persistent asthma with acute exacerbation 12/30/2019   Chronic coughing 12/30/2019   Seasonal allergies 12/30/2019   Loud snoring 12/16/2019  Prediabetes 01/04/2019   Obesity (BMI 30-39.9) 01/04/2019   Ganglion cyst 09/28/2018   Carpal tunnel syndrome of right wrist 09/28/2018   Chronic midline low back pain without sciatica 11/23/2017   Elevated blood pressure reading with diagnosis of hypertension 11/23/2017   Chronic pain of right knee 11/23/2017   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   Marijuana abuse 11/23/2017   Tobacco dependence 11/23/2017   Alcohol abuse 07/30/2016   S/P ACL reconstruction 01/31/2014   DEPRESSION 11/17/2008   ALLERGIC RHINITIS 11/17/2008   GERD 11/17/2008    PCP: Cleotilde Bernardino Hutchinson  [place]  REFERRING PROVIDER: Marcey Raman, MD  REFERRING DIAG: s/p R TKA due to OA  THERAPY DIAG:  Chronic pain of right knee  Difficulty in walking, not elsewhere classified  Other abnormalities of gait and mobility  Acute pain of right knee  Localized edema  Muscle weakness (generalized)  Rationale for Evaluation and Treatment: Rehabilitation  ONSET DATE: 05/13/24  SUBJECTIVE:   SUBJECTIVE STATEMENT: Pt reported she went in for a manipulation last Wednesday; went under anesthesia in order to have RLE knee ROM increased. Pt stated she is feeling a little sore today but overall is doing okay.  PERTINENT HISTORY: Underwent TKA 2 weeks ago, referred to outpt PT PAIN:  Are you having pain? Yes: NPRS scale: 9/10 Pain location: R knee R medial ankle  Pain description: deep pain Aggravating factors: walking worse Relieving factors: meds  PRECAUTIONS: Fall  RED FLAGS: None   WEIGHT BEARING RESTRICTIONS: No  FALLS:  Has patient fallen in last 6 months? No  LIVING ENVIRONMENT: Lives with: lives with an adult companion and lives with their partner Lives in: House/apartment Stairs: Yes: Internal: 11 steps; on right going up Has following equipment at home: Single point cane and Walker - 2 wheeled  OCCUPATION: disability  PLOF: Independent  PATIENT GOALS: no pain  NEXT MD VISIT: 4 weeks, mid Novemeber  OBJECTIVE:  Note: Objective measures were completed at Evaluation unless otherwise noted.  DIAGNOSTIC FINDINGS: na  PATIENT SURVEYS:  KOOS, JR. Score: 25 / 28, Interval Score: 20.941 / 100  COGNITION: Overall cognitive status: Within functional limits for tasks assessed     SENSATION: WFL  EDEMA:  Mod edema R knee, bulbous appearance, steri strips at intervals across R knee incision, bruising med and lat patella, no abnormal warmth noted    POSTURE: leans forward in standing, maintains R knee extended with sitting and standing, any transitional  movements  PALPATION: Good R patellar mobility, very   LOWER EXTREMITY ROM:  Passive ROM Right eval Right 06/12/24 Right 06/25/24  Hip flexion     Hip extension     Hip abduction     Hip adduction     Hip internal rotation     Hip external rotation     Knee flexion 55 70/ P 85 90  Knee extension -10 20 20   Ankle dorsiflexion     Ankle plantarflexion     Ankle inversion     Ankle eversion      (Blank rows = not tested)  LOWER EXTREMITY MMT: R quad set trace, needs total assist to initiate and perform SLR R R hamstrings 2/5, ankle unable to test due to pain L LE wfl   FUNCTIONAL TESTS:  Timed up and go (TUG): greater than 40 sec, with front wheeled walker  GAIT: Distance walked: in clinic step to pattern, externally rotates R lower leg, ankle antalgic on R tends to flex entire trunk Assistive device utilized:  Walker - 2 wheeled Level of assistance: SBA                                                                                                                                TREATMENT DATE:  07/31/24 NuStep L5 x 6 min Reassessment of goals PROM L knee flex/ext Leg ext Rtband 2x10 HS Curls Rtband 2x10 Step Ups 2x10 Lateral Walking  Calf Raises 1x15   06/25/24 NuStep L4x70min Reassessment of RLE ROM AAROM RLE flex/ext Hamstring Stretch 1 trial; Hip flexion stretch 1 trial  Clam Shells 2x10 yellow resistance band Hamstring Curls Red resistance band 2x10 Toe Taps 4 1x10 Step Ups 4 1x10   06/21/24 NuStep L 5 x 5 min NuStep L 4 x 4 min R knee PROM w/ End range hold   Patella Mobs  LAQ RLE 2x10 HS curls yellow 2x10 RLE  Sit to stands 2x10  06/12/24 NuStep L 4 x 4 min R knee PROM w/ End range hold   Patella Mobs  Sit to stands 2x5 RLE LAQ 2x10 06/03/24 NuStep L1 x104mins LE  PROM to R knee, patellar mobs, end range holds to her tolerance  Quad set 5s x5 LAQ x10 Heel slides x10 SAQ 2x10 VASO med pressure, 27F x10 mins    05/27/24:  Evaluation,  education throughout regarding goals, POC, expected outcomes.  Educated and demonstrated with pt elevation over her heart on R to reduce edema.  Instructed in the exercises as listed below, education regarding scarring and pushing R knee flexion motion as much as possible   PATIENT EDUCATION:  Education details: POC, goals  Person educated: Patient Education method: Explanation, Demonstration, Tactile cues, Verbal cues, and Handouts Education comprehension: verbalized understanding and returned demonstration  HOME EXERCISE PROGRAM: Access Code: [email or web address] URL: [email or web address] Date: 05/27/2024 Prepared by: Amy Speaks  Exercises - Supine Quad Set on Towel Roll  - 1 x daily - 7 x weekly - 3 sets - 10 reps - Supine Heel Slide with Strap  - 1 x daily - 7 x weekly - 3 sets - 10 reps - Hooklying Straight Leg Raise  - 1 x daily - 7 x weekly - 3 sets - 10 reps - Lunge on Step  - 1 x daily - 7 x weekly - 3 sets - 10 reps  ASSESSMENT:  CLINICAL IMPRESSION: Pt exhibits noticeable limp when ambulating w/o AD; continue to progress knee ext ROM in order to increase quality of gait w/o AD. Pt stil uses cane for community ambulation. Pt with lower level pain symptoms today upon arrival of PT and able to increase standing tolerance today with being able to stand for longer periods before needing a seated rest period. Pt required SBA to CGA for dynamic standing activity today.   Patient is a 58 y.o. female who was seen today by physical therapy for recovery following R TKA.  Pt enters  with 9/10 pain rating, no improvement made  with ROM. Pt doe not tolerated activity well, not allowing much stretching. LAQ caused quad cramping. Expressed the importance of HEP and stretching at home.   OBJECTIVE IMPAIRMENTS: decreased activity tolerance, decreased balance, decreased endurance, decreased knowledge of condition, decreased knowledge of use of DME, decreased mobility, difficulty walking,  decreased ROM, decreased strength, increased edema, increased fascial restrictions, impaired perceived functional ability, impaired flexibility, and pain.   ACTIVITY LIMITATIONS: carrying, lifting, bending, sitting, standing, squatting, sleeping, stairs, transfers, bed mobility, bathing, toileting, dressing, locomotion level, and caring for others  PARTICIPATION LIMITATIONS: meal prep, cleaning, laundry, shopping, community activity, and yard work  PERSONAL FACTORS: Education, Fitness, Time since onset of injury/illness/exacerbation, and 1-2 comorbidities: COPD, depression are also affecting patient's functional outcome.   REHAB POTENTIAL: Fair comorbidities  CLINICAL DECISION MAKING: Unstable/unpredictable  EVALUATION COMPLEXITY: Moderate   GOALS: Goals reviewed with patient? Yes  SHORT TERM GOALS: Target date: 2 weeks 06/10/24 I HEP  Baseline:initiated Goal status: on going 06/12/24, ongoing 06/25/24; Doing less than half of HEP 07/31/24    LONG TERM GOALS: Target date: 12 weeks, 08/19/24  KOOS, JR. Score: 25 / 28, Interval Score: 20.941 / 100 improve to 5/28 Baseline:  Goal status: IN PROGRESS KOOS, JR. Score: 11 / 28, Interval Score: 59.381 / 100 07/31/24  2.  TUG less than 14 sec with LAD Baseline: greater than 40 sec with r walker Goal status: IN PROGRESS 14s 07/31/24  3.  ROM R knee 110 to -5 Baseline: 55 to -10 Goal status: ongoing 06/21/24; 90 to -20 06/25/24; 85 to -10 07/31/24  4.  LE strength R 5/5, long arc quad and SLR without lag to prevent buckling, falls  Baseline:  Goal status: IN PROGRESS not at full range yet in RLE 07/31/24    PLAN:  PT FREQUENCY: 2x/week  PT DURATION: 12 weeks  PLANNED INTERVENTIONS: 97110-Therapeutic exercises, 97530- Therapeutic activity, 97112- Neuromuscular re-education, 97535- Self Care, [id number]- Manual therapy, [id number]- Vasopneumatic device, Patient/Family education, Cryotherapy, and Moist heat  PLAN FOR NEXT SESSION: manual  techniques, utilize Nustep or recumbent cycle, progress/ encourage ROM and motor recruitment R LE   [employer], Student-PT, 07/31/2024, 12:28 PM  "

## 2024-08-07 ENCOUNTER — Ambulatory Visit: Admitting: Physical Therapy

## 2024-08-13 ENCOUNTER — Ambulatory Visit: Admitting: Physical Therapy

## 2024-08-15 ENCOUNTER — Ambulatory Visit: Admitting: Physical Therapy

## 2024-08-19 ENCOUNTER — Ambulatory Visit: Attending: Orthopedic Surgery | Admitting: Physical Therapy
